# Patient Record
Sex: Male | Born: 1940 | Race: White | Hispanic: No | Marital: Married
Health system: Southern US, Academic
[De-identification: ages and names within clinical notes are randomized; demographics above are authoritative.]

## PROBLEM LIST (undated history)

## (undated) ENCOUNTER — Encounter

## (undated) ENCOUNTER — Telehealth

## (undated) ENCOUNTER — Encounter: Attending: Internal Medicine | Primary: Internal Medicine

## (undated) ENCOUNTER — Encounter: Attending: "Endocrinology | Primary: "Endocrinology

## (undated) ENCOUNTER — Ambulatory Visit: Payer: MEDICARE | Attending: "Endocrinology | Primary: "Endocrinology

## (undated) ENCOUNTER — Ambulatory Visit: Payer: MEDICARE

## (undated) ENCOUNTER — Ambulatory Visit

## (undated) ENCOUNTER — Ambulatory Visit: Payer: MEDICARE | Attending: Cardiovascular Disease | Primary: Cardiovascular Disease

## (undated) ENCOUNTER — Ambulatory Visit
Payer: MEDICARE | Attending: Student in an Organized Health Care Education/Training Program | Primary: Student in an Organized Health Care Education/Training Program

## (undated) ENCOUNTER — Telehealth: Attending: Internal Medicine | Primary: Internal Medicine

## (undated) ENCOUNTER — Ambulatory Visit: Payer: MEDICARE | Attending: Family | Primary: Family

## (undated) ENCOUNTER — Ambulatory Visit: Payer: MEDICARE | Attending: Physical Medicine & Rehabilitation | Primary: Physical Medicine & Rehabilitation

## (undated) ENCOUNTER — Ambulatory Visit: Payer: Medicare (Managed Care)

## (undated) ENCOUNTER — Encounter
Attending: Student in an Organized Health Care Education/Training Program | Primary: Student in an Organized Health Care Education/Training Program

## (undated) ENCOUNTER — Telehealth
Attending: Student in an Organized Health Care Education/Training Program | Primary: Student in an Organized Health Care Education/Training Program

## (undated) ENCOUNTER — Encounter: Attending: Adult Health | Primary: Adult Health

## (undated) ENCOUNTER — Ambulatory Visit: Payer: MEDICARE | Attending: Registered" | Primary: Registered"

## (undated) ENCOUNTER — Ambulatory Visit: Payer: MEDICARE | Attending: Internal Medicine | Primary: Internal Medicine

## (undated) ENCOUNTER — Telehealth: Attending: "Endocrinology | Primary: "Endocrinology

## (undated) ENCOUNTER — Ambulatory Visit: Payer: MEDICARE | Attending: Urology | Primary: Urology

## (undated) ENCOUNTER — Encounter: Attending: Cardiovascular Disease | Primary: Cardiovascular Disease

## (undated) ENCOUNTER — Encounter: Attending: Pharmacotherapy | Primary: Pharmacotherapy

## (undated) ENCOUNTER — Ambulatory Visit: Attending: Clinical | Primary: Clinical

## (undated) ENCOUNTER — Telehealth: Attending: Audiologist | Primary: Audiologist

## (undated) ENCOUNTER — Ambulatory Visit
Payer: MEDICARE | Attending: Rehabilitative and Restorative Service Providers" | Primary: Rehabilitative and Restorative Service Providers"

## (undated) ENCOUNTER — Telehealth: Attending: Family | Primary: Family

## (undated) ENCOUNTER — Telehealth: Attending: Urology | Primary: Urology

## (undated) ENCOUNTER — Encounter: Attending: Clinical | Primary: Clinical

## (undated) ENCOUNTER — Telehealth: Attending: Pharmacotherapy | Primary: Pharmacotherapy

## (undated) ENCOUNTER — Institutional Professional Consult (permissible substitution): Payer: MEDICARE

## (undated) ENCOUNTER — Encounter: Attending: Family | Primary: Family

## (undated) DIAGNOSIS — Z972 Presence of dental prosthetic device (complete) (partial): Secondary | ICD-10-CM

## (undated) DIAGNOSIS — IMO0001 Reserved for inherently not codable concepts without codable children: Secondary | ICD-10-CM

## (undated) DIAGNOSIS — R0600 Dyspnea, unspecified: Secondary | ICD-10-CM

## (undated) DIAGNOSIS — F329 Major depressive disorder, single episode, unspecified: Secondary | ICD-10-CM

## (undated) DIAGNOSIS — Z8709 Personal history of other diseases of the respiratory system: Secondary | ICD-10-CM

## (undated) DIAGNOSIS — N200 Calculus of kidney: Secondary | ICD-10-CM

## (undated) DIAGNOSIS — J302 Other seasonal allergic rhinitis: Secondary | ICD-10-CM

## (undated) DIAGNOSIS — M5030 Other cervical disc degeneration, unspecified cervical region: Secondary | ICD-10-CM

## (undated) DIAGNOSIS — E119 Type 2 diabetes mellitus without complications: Secondary | ICD-10-CM

## (undated) DIAGNOSIS — I2510 Atherosclerotic heart disease of native coronary artery without angina pectoris: Secondary | ICD-10-CM

## (undated) DIAGNOSIS — F32A Depression, unspecified: Secondary | ICD-10-CM

## (undated) DIAGNOSIS — M47815 Spondylosis without myelopathy or radiculopathy, thoracolumbar region: Secondary | ICD-10-CM

## (undated) DIAGNOSIS — E785 Hyperlipidemia, unspecified: Secondary | ICD-10-CM

## (undated) DIAGNOSIS — K219 Gastro-esophageal reflux disease without esophagitis: Secondary | ICD-10-CM

## (undated) DIAGNOSIS — J449 Chronic obstructive pulmonary disease, unspecified: Secondary | ICD-10-CM

## (undated) DIAGNOSIS — I219 Acute myocardial infarction, unspecified: Secondary | ICD-10-CM

## (undated) DIAGNOSIS — H9193 Unspecified hearing loss, bilateral: Secondary | ICD-10-CM

## (undated) DIAGNOSIS — H269 Unspecified cataract: Secondary | ICD-10-CM

## (undated) DIAGNOSIS — N281 Cyst of kidney, acquired: Secondary | ICD-10-CM

## (undated) DIAGNOSIS — M479 Spondylosis, unspecified: Secondary | ICD-10-CM

## (undated) DIAGNOSIS — C801 Malignant (primary) neoplasm, unspecified: Secondary | ICD-10-CM

## (undated) DIAGNOSIS — M1990 Unspecified osteoarthritis, unspecified site: Secondary | ICD-10-CM

## (undated) DIAGNOSIS — Z87442 Personal history of urinary calculi: Secondary | ICD-10-CM

## (undated) DIAGNOSIS — J45909 Unspecified asthma, uncomplicated: Secondary | ICD-10-CM

## (undated) DIAGNOSIS — L409 Psoriasis, unspecified: Secondary | ICD-10-CM

## (undated) HISTORY — DX: Type 2 diabetes mellitus without complications: E11.9

## (undated) HISTORY — PX: CARDIAC CATHETERIZATION: SHX172

## (undated) HISTORY — PX: COLONOSCOPY: SHX174

## (undated) HISTORY — DX: Chronic obstructive pulmonary disease, unspecified: J44.9

## (undated) HISTORY — DX: Acute myocardial infarction, unspecified: I21.9

## (undated) HISTORY — PX: OTHER SURGICAL HISTORY: SHX169

## (undated) HISTORY — DX: Psoriasis, unspecified: L40.9

## (undated) HISTORY — PX: LIPOMA EXCISION: SHX5283

## (undated) HISTORY — DX: Unspecified osteoarthritis, unspecified site: M19.90

## (undated) HISTORY — DX: Gastro-esophageal reflux disease without esophagitis: K21.9

---
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
## 1990-05-29 HISTORY — PX: SINUS EXPLORATION: SHX5214

## 1990-05-29 HISTORY — PX: HIATAL HERNIA REPAIR: SHX195

## 2000-08-14 DIAGNOSIS — I2510 Atherosclerotic heart disease of native coronary artery without angina pectoris: Secondary | ICD-10-CM | POA: Insufficient documentation

## 2000-08-14 DIAGNOSIS — J45909 Unspecified asthma, uncomplicated: Secondary | ICD-10-CM | POA: Insufficient documentation

## 2000-08-14 DIAGNOSIS — K219 Gastro-esophageal reflux disease without esophagitis: Secondary | ICD-10-CM | POA: Insufficient documentation

## 2006-02-27 ENCOUNTER — Ambulatory Visit: Payer: Self-pay | Admitting: Urology

## 2006-08-29 ENCOUNTER — Ambulatory Visit: Payer: Self-pay | Admitting: Urology

## 2007-05-01 DIAGNOSIS — J329 Chronic sinusitis, unspecified: Secondary | ICD-10-CM | POA: Insufficient documentation

## 2007-10-10 ENCOUNTER — Ambulatory Visit: Payer: Self-pay | Admitting: Urology

## 2007-10-15 ENCOUNTER — Ambulatory Visit: Payer: Self-pay | Admitting: Urology

## 2008-07-30 DIAGNOSIS — E669 Obesity, unspecified: Secondary | ICD-10-CM | POA: Insufficient documentation

## 2009-07-15 DIAGNOSIS — E119 Type 2 diabetes mellitus without complications: Secondary | ICD-10-CM | POA: Insufficient documentation

## 2009-08-06 DIAGNOSIS — L409 Psoriasis, unspecified: Secondary | ICD-10-CM | POA: Insufficient documentation

## 2010-10-20 DIAGNOSIS — M5412 Radiculopathy, cervical region: Secondary | ICD-10-CM | POA: Insufficient documentation

## 2012-05-14 DIAGNOSIS — J301 Allergic rhinitis due to pollen: Secondary | ICD-10-CM | POA: Insufficient documentation

## 2014-01-13 DIAGNOSIS — E785 Hyperlipidemia, unspecified: Secondary | ICD-10-CM | POA: Insufficient documentation

## 2014-05-29 DIAGNOSIS — I219 Acute myocardial infarction, unspecified: Secondary | ICD-10-CM

## 2014-05-29 HISTORY — DX: Acute myocardial infarction, unspecified: I21.9

## 2014-05-29 HISTORY — PX: CORONARY ANGIOPLASTY: SHX604

## 2014-05-29 HISTORY — PX: CORONARY STENT PLACEMENT: SHX1402

## 2014-08-28 DIAGNOSIS — M5030 Other cervical disc degeneration, unspecified cervical region: Secondary | ICD-10-CM | POA: Insufficient documentation

## 2014-12-31 DIAGNOSIS — I213 ST elevation (STEMI) myocardial infarction of unspecified site: Secondary | ICD-10-CM | POA: Insufficient documentation

## 2015-02-22 ENCOUNTER — Encounter: Payer: Medicare Other | Attending: Cardiology | Admitting: *Deleted

## 2015-02-22 VITALS — Ht 69.0 in | Wt 233.4 lb

## 2015-02-22 DIAGNOSIS — Z9861 Coronary angioplasty status: Secondary | ICD-10-CM | POA: Diagnosis present

## 2015-02-22 DIAGNOSIS — H9193 Unspecified hearing loss, bilateral: Secondary | ICD-10-CM | POA: Insufficient documentation

## 2015-02-22 DIAGNOSIS — I2111 ST elevation (STEMI) myocardial infarction involving right coronary artery: Secondary | ICD-10-CM | POA: Insufficient documentation

## 2015-02-22 DIAGNOSIS — J449 Chronic obstructive pulmonary disease, unspecified: Secondary | ICD-10-CM | POA: Insufficient documentation

## 2015-02-22 NOTE — Progress Notes (Signed)
Cardiac Individual Treatment Plan  Patient Details  Name: Dustin Blackburn MRN: [id number] Date of Birth: 1940-07-09 Referring Provider:  Creta Levin, MD  Initial Encounter Date: Date: 02/22/15  Visit Diagnosis: ST elevation myocardial infarction involving right coronary artery  S/P PTCA (percutaneous transluminal coronary angioplasty)  Patient's Home Medications on Admission:  Current outpatient prescriptions:  .  albuterol (PROVENTIL) (2.5 MG/3ML) 0.083% nebulizer solution, 2.5 mg., Disp: , Rfl:  .  aspirin EC 81 MG tablet, Take 81 mg by mouth., Disp: , Rfl:  .  azelastine (ASTELIN) 0.1 % nasal spray, 1 spray by Each Nare route Two (2) times a day., Disp: , Rfl:  .  budesonide-formoterol (SYMBICORT) 160-4.5 MCG/ACT inhaler, Inhale into the lungs., Disp: , Rfl:  .  fluocinonide cream (LIDEX) 0.05 %, Apply topically., Disp: , Rfl:  .  fluticasone (FLONASE) 50 MCG/ACT nasal spray, 2 sprays by Each Nare route daily., Disp: , Rfl:  .  montelukast (SINGULAIR) 10 MG tablet, Take by mouth., Disp: , Rfl:  .  glipiZIDE (GLUCOTROL) 5 MG tablet, Take 5 mg by mouth. IS taking 2.5 mg 2 times a day, Disp: , Rfl:   Past Medical History: No past medical history on file.  Tobacco Use: History  Smoking status  . Not on file  Smokeless tobacco  . Not on file    Labs: Recent Review Flowsheet Data    There is no flowsheet data to display.       Exercise Target Goals: Date: 02/22/15  Exercise Program Goal: Individual exercise prescription set with THRR, safety & activity barriers. Participant demonstrates ability to understand and report RPE using BORG scale, to self-measure pulse accurately, and to acknowledge the importance of the exercise prescription.  Exercise Prescription Goal: Starting with aerobic activity 30 plus minutes a day, 3 days per week for initial exercise prescription. Provide home exercise prescription and guidelines that participant acknowledges understanding  prior to discharge.  Activity Barriers & Risk Stratification:     Activity Barriers & Risk Stratification - 02/22/15 1358    Activity Barriers & Risk Stratification   Activity Barriers None   Risk Stratification High      6 Minute Walk:     6 Minute Walk      02/22/15 1356       6 Minute Walk   Phase Initial     Distance 1612 feet     Walk Time 6 minutes     Resting HR 76 bpm     Resting BP 124/70 mmHg     Max Ex. HR 108 bpm     Max Ex. BP 146/80 mmHg     RPE 11     Symptoms No        Initial Exercise Prescription:     Initial Exercise Prescription - 02/22/15 1300    Date of Initial Exercise Prescription   Date 02/22/15   Treadmill   MPH 3   Grade 0   Minutes 15   Bike   Level 1.5   Minutes 15   Recumbant Bike   Level 3   Watts 30   Minutes 15   NuStep   Level 3   Watts 40   Minutes 15   Arm Ergometer   Level 2   Watts 15   Minutes 15   Arm/Foot Ergometer   Level 2   Watts 20   Minutes 15   Cybex   Level 2   RPM 20   Minutes 15  Recumbant Elliptical   Level 1   Watts 20   Minutes 15   Elliptical   Level 1   Speed 3   Minutes 5   REL-XR   Level 3   Watts 30   Minutes 15   Prescription Details   Frequency (times per week) 3   Duration Progress to 30 minutes of continuous aerobic without signs/symptoms of physical distress   Intensity   THRR REST +  30   Ratings of Perceived Exertion 11-15   Progression Continue progressive overload as per policy without signs/symptoms or physical distress.   Resistance Training   Training Prescription Yes   Weight 3   Reps 10-12      Exercise Prescription Changes:   Discharge Exercise Prescription (Final Exercise Prescription Changes):   Nutrition:  Target Goals: Understanding of nutrition guidelines, daily intake of sodium 1500mg , cholesterol 200mg , calories 30% from fat and 7% or less from saturated fats, daily to have 5 or more servings of fruits and vegetables.  Biometrics:      Pre Biometrics - 02/22/15 1401    Pre Biometrics   Height 5\' 9"  (1.753 m)   Weight 233 lb 6.4 oz (105.87 kg)   Waist Circumference 47.5 inches   Hip Circumference 45 inches   Waist to Hip Ratio 1.06 %   BMI (Calculated) 34.5       Nutrition Therapy Plan and Nutrition Goals:   Nutrition Discharge: Rate Your Plate Scores:   Nutrition Goals Re-Evaluation:   Psychosocial: Target Goals: Acknowledge presence or absence of depression, maximize coping skills, provide positive support system. Participant is able to verbalize types and ability to use techniques and skills needed for reducing stress and depression.  Initial Review & Psychosocial Screening:     Initial Psych Review & Screening - 02/22/15 1501    Family Dynamics   Good Support System? Yes   Barriers   Psychosocial barriers to participate in program There are no identifiable barriers or psychosocial needs.;The patient should benefit from training in stress management and relaxation.   Screening Interventions   Interventions Encouraged to exercise;Program counselor consult      Quality of Life Scores:     Quality of Life - 02/22/15 1501    Quality of Life Scores   Health/Function Pre 25.13 %   Socioeconomic Pre 28 %   Psych/Spiritual Pre 28.29 %   Family Pre 30 %   GLOBAL Pre 27.11 %      PHQ-9:     Recent Review Flowsheet Data    Depression screen [facility] 2/9 02/22/2015   Decreased Interest 0   Down, Depressed, Hopeless 0   PHQ - 2 Score 0   Altered sleeping 0   Tired, decreased energy 1   Change in appetite 0   Feeling bad or failure about yourself  0   Trouble concentrating 0   Moving slowly or fidgety/restless 0   Suicidal thoughts 0   PHQ-9 Score 1   Difficult doing work/chores Somewhat difficult      Psychosocial Evaluation and Intervention:   Psychosocial Re-Evaluation:   Vocational Rehabilitation: Provide vocational rehab assistance to qualifying candidates.   Vocational Rehab  Evaluation & Intervention:     Vocational Rehab - 02/22/15 1359    Initial Vocational Rehab Evaluation & Intervention   Assessment shows need for Vocational Rehabilitation No      Education: Education Goals: Education classes will be provided on a weekly basis, covering required topics. Participant will state understanding/return demonstration  of topics presented.  Learning Barriers/Preferences:     Learning Barriers/Preferences - 02/22/15 1359    Learning Barriers/Preferences   Learning Barriers Hearing   Learning Preferences None      Education Topics: General Nutrition Guidelines/Fats and Fiber: -Group instruction provided by verbal, written material, models and posters to present the general guidelines for heart healthy nutrition. Gives an explanation and review of dietary fats and fiber.   Controlling Sodium/Reading Food Labels: -Group verbal and written material supporting the discussion of sodium use in heart healthy nutrition. Review and explanation with models, verbal and written materials for utilization of the food label.   Exercise Physiology & Risk Factors: - Group verbal and written instruction with models to review the exercise physiology of the cardiovascular system and associated critical values. Details cardiovascular disease risk factors and the goals associated with each risk factor.   Aerobic Exercise & Resistance Training: - Gives group verbal and written discussion on the health impact of inactivity. On the components of aerobic and resistive training programs and the benefits of this training and how to safely progress through these programs.   Flexibility, Balance, General Exercise Guidelines: - Provides group verbal and written instruction on the benefits of flexibility and balance training programs. Provides general exercise guidelines with specific guidelines to those with heart or lung disease. Demonstration and skill practice provided.   Stress  Management: - Provides group verbal and written instruction about the health risks of elevated stress, cause of high stress, and healthy ways to reduce stress.   Depression: - Provides group verbal and written instruction on the correlation between heart/lung disease and depressed mood, treatment options, and the stigmas associated with seeking treatment.   Anatomy & Physiology of the Heart: - Group verbal and written instruction and models provide basic cardiac anatomy and physiology, with the coronary electrical and arterial systems. Review of: AMI, Angina, Valve disease, Heart Failure, Cardiac Arrhythmia, Pacemakers, and the ICD.   Cardiac Procedures: - Group verbal and written instruction and models to describe the testing methods done to diagnose heart disease. Reviews the outcomes of the test results. Describes the treatment choices: Medical Management, Angioplasty, or Coronary Bypass Surgery.   Cardiac Medications: - Group verbal and written instruction to review commonly prescribed medications for heart disease. Reviews the medication, class of the drug, and side effects. Includes the steps to properly store meds and maintain the prescription regimen.   Go Sex-Intimacy & Heart Disease, Get SMART - Goal Setting: - Group verbal and written instruction through game format to discuss heart disease and the return to sexual intimacy. Provides group verbal and written material to discuss and apply goal setting through the application of the S.M.A.R.T. Method.   Other Matters of the Heart: - Provides group verbal, written materials and models to describe Heart Failure, Angina, Valve Disease, and Diabetes in the realm of heart disease. Includes description of the disease process and treatment options available to the cardiac patient.   Exercise & Equipment Safety: - Individual verbal instruction and demonstration of equipment use and safety with use of the equipment.          Cardiac  Rehab from 02/22/2015 in [facility] Cardiac Rehab   Date  02/22/15   Educator  SB   Instruction Review Code  2- meets goals/outcomes      Infection Prevention: - Provides verbal and written material to individual with discussion of infection control including proper hand washing and proper equipment cleaning during exercise session.  Cardiac Rehab from 02/22/2015 in [facility]iac Rehab   Date  02/22/15   Educator  SB   Instruction Review Code  2- meets goals/outcomes      Falls Prevention: - Provides verbal and written material to individual with discussion of falls prevention and safety.      Cardiac Rehab from 02/22/2015 in [facility] Cardiac Rehab   Date  02/22/15   Educator  SB   Instruction Review Code  2- meets goals/outcomes      Diabetes: - Individual verbal and written instruction to review signs/symptoms of diabetes, desired ranges of glucose level fasting, after meals and with exercise. Advice that pre and post exercise glucose checks will be done for 3 sessions at entry of program.      Cardiac Rehab from 02/22/2015 in [facility] Cardiac Rehab   Date  02/22/15   Educator  SB   Instruction Review Code  2- meets goals/outcomes       Knowledge Questionnaire Score:     Knowledge Questionnaire Score - 02/22/15 1359    Knowledge Questionnaire Score   Pre Score 19/28      Personal Goals and Risk Factors at Admission:     Personal Goals and Risk Factors at Admission - 02/22/15 1402    Personal Goals and Risk Factors on Admission    Weight Management Yes;Obesity   Intervention Learn and follow the exercise and diet guidelines while in the program. Utilize the nutrition and education classes to help gain knowledge of the diet and exercise expectations in the program   Intervention Provide weight management tools through evaluation completed by registered dietician and exercise physiologist.  Establish a goal weight with participant.   Goal Weight 190 lb (86.183 kg)  Billy wants to  get weight down to less than 200 pounds. He would like to get to 190 pounds. Abe People will meet with RD  for  nutrtion guidelines and work with the Ex Phys for his exercise progression.    Diabetes Yes  Abe People has been on several medication s for his diabets. He is currently on Glutrol with AM fasting BLood sugar readings120-130. He cannot tolerate several of the medications.   Goal Blood glucose control identified by blood glucose values, HgbA1C. Participant verbalizes understanding of the signs/symptoms of hyper/hypo glycemia, proper foot care and importance of medication and nutrition plan for blood glucose control.   Intervention Provide nutrition & aerobic exercise along with prescribed medications to achieve blood glucose in normal ranges: Fasting 65-99 mg/dL   Lipids Yes  Abe People has stopped his astorvastatin. He has a holistic physician that has [place] on natural medication supplements and IV Chelation for Cholesterol control.  He expects to have labwork soon.    Goal Cholesterol controlled with medications as prescribed, with individualized exercise RX and with personalized nutrition plan. Value goals: LDL < 70mg , HDL > 40mg . Participant states understanding of desired cholesterol values and following prescriptions.   Intervention Provide nutrition & aerobic exercise along with prescribed medications to achieve LDL 70mg , HDL >40mg .      Personal Goals and Risk Factors Review:    Personal Goals Discharge (Final Personal Goals and Risk Factors Review):     Comments: 30 day review. Billyhas completed orientation and will start classes soon.

## 2015-02-22 NOTE — Patient Instructions (Signed)
Patient Instructions  Patient Details  Name: Dustin Blackburn MRN: [id number] Date of Birth: [birth year] Referring Provider:  Creta Levin, MD  Below are the personal goals you chose as well as exercise and nutrition goals. Our goal is to help you keep on track towards obtaining and maintaining your goals. We will be discussing your progress on these goals with you throughout the program.  Initial Exercise Prescription:     Initial Exercise Prescription - 02/22/15 1300    Date of Initial Exercise Prescription   Date 02/22/15   Treadmill   MPH 3   Grade 0   Minutes 15   Bike   Level 1.5   Minutes 15   Recumbant Bike   Level 3   Watts 30   Minutes 15   NuStep   Level 3   Watts 40   Minutes 15   Arm Ergometer   Level 2   Watts 15   Minutes 15   Arm/Foot Ergometer   Level 2   Watts 20   Minutes 15   Cybex   Level 2   RPM 20   Minutes 15   Recumbant Elliptical   Level 1   Watts 20   Minutes 15   Elliptical   Level 1   Speed 3   Minutes 5   REL-XR   Level 3   Watts 30   Minutes 15   Prescription Details   Frequency (times per week) 3   Duration Progress to 30 minutes of continuous aerobic without signs/symptoms of physical distress   Intensity   THRR REST +  30   Ratings of Perceived Exertion 11-15   Progression Continue progressive overload as per policy without signs/symptoms or physical distress.   Resistance Training   Training Prescription Yes   Weight 3   Reps 10-12      Exercise Goals: Frequency: Be able to perform aerobic exercise three times per week working toward 3-5 days per week.  Intensity: Work with a perceived exertion of 11 (fairly light) - 15 (hard) as tolerated. Follow your new exercise prescription and watch for changes in prescription as you progress with the program. Changes will be reviewed with you when they are made.  Duration: You should be able to do 30 minutes of continuous aerobic exercise in addition to a 5 minute  warm-up and a 5 minute cool-down routine.  Nutrition Goals: Your personal nutrition goals will be established when you do your nutrition analysis with the dietician.  The following are nutrition guidelines to follow: Cholesterol < 200mg /day Sodium < 1500mg /day Fiber: Men over 50 yrs - 30 grams per day  Personal Goals:     Personal Goals and Risk Factors at Admission - 02/22/15 1402    Personal Goals and Risk Factors on Admission    Weight Management Yes;Obesity   Intervention Learn and follow the exercise and diet guidelines while in the program. Utilize the nutrition and education classes to help gain knowledge of the diet and exercise expectations in the program   Intervention Provide weight management tools through evaluation completed by registered dietician and exercise physiologist.  Establish a goal weight with participant.   Goal Weight 190 lb (86.183 kg)  Dustin Blackburn wants to get weight down to less than 200 pounds. He would like to get to 190 pounds. Dustin Blackburn will meet with RD  for  nutrtion guidelines and work with the Ex Phys for his exercise progression.    Diabetes Yes  Dustin Blackburn  has been on several medication s for his diabets. He is currently on Glutrol with AM fasting BLood sugar readings120-130. He cannot tolerate several of the medications.   Goal Blood glucose control identified by blood glucose values, HgbA1C. Participant verbalizes understanding of the signs/symptoms of hyper/hypo glycemia, proper foot care and importance of medication and nutrition plan for blood glucose control.   Intervention Provide nutrition & aerobic exercise along with prescribed medications to achieve blood glucose in normal ranges: Fasting 65-99 mg/dL   Lipids Yes  Dustin Blackburn has stopped his astorvastatin. He has a holistic physician that has [place] on natural medication supplements and IV Chelation for Cholesterol control.  He expects to have labwork soon.    Goal Cholesterol controlled with medications as  prescribed, with individualized exercise RX and with personalized nutrition plan. Value goals: LDL < 70mg , HDL > 40mg . Participant states understanding of desired cholesterol values and following prescriptions.   Intervention Provide nutrition & aerobic exercise along with prescribed medications to achieve LDL 70mg , HDL >40mg .      Tobacco Use Initial Evaluation: History  Smoking status  . Not on file  Smokeless tobacco  . Not on file    Copy of goals given to participant.

## 2015-03-01 ENCOUNTER — Encounter: Payer: Medicare Other | Attending: Cardiology | Admitting: *Deleted

## 2015-03-01 DIAGNOSIS — I2111 ST elevation (STEMI) myocardial infarction involving right coronary artery: Secondary | ICD-10-CM | POA: Diagnosis not present

## 2015-03-01 DIAGNOSIS — Z9861 Coronary angioplasty status: Secondary | ICD-10-CM | POA: Diagnosis present

## 2015-03-01 LAB — GLUCOSE, CAPILLARY: Glucose-Capillary: 141 mg/dL — ABNORMAL HIGH (ref 65–99)

## 2015-03-01 NOTE — Progress Notes (Signed)
Daily Session Note  Patient Details  Name: Dustin Blackburn MRN: [id number] Date of Birth: [birth year] Referring Provider:  Creta Levin, MD  Encounter Date: 03/01/2015  Check In:     Session Check In - 03/01/15 0822    Check-In   Staff Present Candiss Norse MS, ACSM CEP Exercise Physiologist;Lakeia Bradshaw Alfonso Patten, ACSM CEP Exercise Physiologist;Carroll Enterkin RN, BSN   Medication changes reported     Yes   Comments atorvastatin 40 mg tab 1 time/day; lisinopril 2.5 mg 1 time/day; metoprolol tartrate 25 mg tablet (1/2 tab/12.5 mg 2x/day)    Fall or balance concerns reported    No   Warm-up and Cool-down Performed on first and last piece of equipment   VAD Patient? No   Pain Assessment   Currently in Pain? No/denies   Multiple Pain Sites No         Goals Met:  Independence with exercise equipment Exercise tolerated well No report of cardiac concerns or symptoms Strength training completed today  Goals Unmet:  Not Applicable  Goals Comments: Today was the patient's first day of class. The patient's initial exercise prescription (based on the 6 min walk evaluation) was reviewed with the patient.    Dr. Emily Filbert is Medical Director for [place] and [facility].

## 2015-03-03 DIAGNOSIS — I2111 ST elevation (STEMI) myocardial infarction involving right coronary artery: Secondary | ICD-10-CM

## 2015-03-03 NOTE — Progress Notes (Signed)
Daily Session Note  Patient Details  Name: Dustin Blackburn MRN: [id number] Date of Birth: 1940-09-27 Referring Provider:  Creta Levin, MD  Encounter Date: 03/03/2015  Check In:     Session Check In - 03/03/15 0826    Check-In   Staff Present Nyoka Cowden RN;Renee [place] MS, ACSM CEP Exercise Physiologist;Cloyce Blankenhorn BS, ACSM EP-C, Exercise Physiologist   ER physicians immediately available to respond to emergencies See telemetry face sheet for immediately available ER MD   Medication changes reported     No   Fall or balance concerns reported    No   Warm-up and Cool-down Performed on first and last piece of equipment   VAD Patient? No   Pain Assessment   Currently in Pain? No/denies         Goals Met:  Proper associated with RPD/PD & O2 Sat Exercise tolerated well No report of cardiac concerns or symptoms Strength training completed today  Goals Unmet:  Not Applicable  Goals Comments:    Dr. Emily Filbert is Medical Director for [place] and [facility].

## 2015-03-10 ENCOUNTER — Encounter: Payer: Self-pay | Admitting: *Deleted

## 2015-03-10 DIAGNOSIS — I2111 ST elevation (STEMI) myocardial infarction involving right coronary artery: Secondary | ICD-10-CM

## 2015-03-10 NOTE — Progress Notes (Signed)
Cardiac Individual Treatment Plan  Patient Details  Name: Dustin Blackburn MRN: [id number] Date of Birth: [birth year] Referring Provider:  No ref. provider found  Initial Encounter Date:    Visit Diagnosis: ST elevation myocardial infarction involving right coronary artery ([place])  Patient's Home Medications on Admission:  Current outpatient prescriptions:  .  albuterol (PROVENTIL) (2.5 MG/3ML) 0.083% nebulizer solution, 2.5 mg., Disp: , Rfl:  .  aspirin EC 81 MG tablet, Take 81 mg by mouth., Disp: , Rfl:  .  atorvastatin (LIPITOR) 40 MG tablet, Take 40 mg by mouth., Disp: , Rfl:  .  azelastine (ASTELIN) 0.1 % nasal spray, 1 spray by Each Nare route Two (2) times a day., Disp: , Rfl:  .  budesonide-formoterol (SYMBICORT) 160-4.5 MCG/ACT inhaler, Inhale into the lungs., Disp: , Rfl:  .  fluocinonide cream (LIDEX) 0.05 %, Apply topically., Disp: , Rfl:  .  fluticasone (FLONASE) 50 MCG/ACT nasal spray, 2 sprays by Each Nare route daily., Disp: , Rfl:  .  glipiZIDE (GLUCOTROL) 5 MG tablet, Take 5 mg by mouth. IS taking 2.5 mg 2 times a day, Disp: , Rfl:  .  lisinopril (PRINIVIL,ZESTRIL) 2.5 MG tablet, Take by mouth., Disp: , Rfl:  .  metoprolol tartrate (LOPRESSOR) 25 MG tablet, Take by mouth., Disp: , Rfl:  .  montelukast (SINGULAIR) 10 MG tablet, Take by mouth., Disp: , Rfl:   Past Medical History: No past medical history on file.  Tobacco Use: History  Smoking status  . Not on file  Smokeless tobacco  . Not on file    Labs: Recent Review Flowsheet Data    There is no flowsheet data to display.       Exercise Target Goals:    Exercise Program Goal: Individual exercise prescription set with THRR, safety & activity barriers. Participant demonstrates ability to understand and report RPE using BORG scale, to self-measure pulse accurately, and to acknowledge the importance of the exercise prescription.  Exercise Prescription Goal: Starting with aerobic activity 30 plus minutes  a day, 3 days per week for initial exercise prescription. Provide home exercise prescription and guidelines that participant acknowledges understanding prior to discharge.  Activity Barriers & Risk Stratification:     Activity Barriers & Risk Stratification - 02/22/15 1358    Activity Barriers & Risk Stratification   Activity Barriers None   Risk Stratification High      6 Minute Walk:     6 Minute Walk      02/22/15 1356       6 Minute Walk   Phase Initial     Distance 1612 feet     Walk Time 6 minutes     Resting HR 76 bpm     Resting BP 124/70 mmHg     Max Ex. HR 108 bpm     Max Ex. BP 146/80 mmHg     RPE 11     Symptoms No        Initial Exercise Prescription:     Initial Exercise Prescription - 02/22/15 1300    Date of Initial Exercise Prescription   Date 02/22/15   Treadmill   MPH 3   Grade 0   Minutes 15   Bike   Level 1.5   Minutes 15   Recumbant Bike   Level 3   Watts 30   Minutes 15   NuStep   Level 3   Watts 40   Minutes 15   Arm Ergometer   Level 2  Watts 15   Minutes 15   Arm/Foot Ergometer   Level 2   Watts 20   Minutes 15   Cybex   Level 2   RPM 20   Minutes 15   Recumbant Elliptical   Level 1   Watts 20   Minutes 15   Elliptical   Level 1   Speed 3   Minutes 5   REL-XR   Level 3   Watts 30   Minutes 15   Prescription Details   Frequency (times per week) 3   Duration Progress to 30 minutes of continuous aerobic without signs/symptoms of physical distress   Intensity   THRR REST +  30   Ratings of Perceived Exertion 11-15   Progression Continue progressive overload as per policy without signs/symptoms or physical distress.   Resistance Training   Training Prescription Yes   Weight 3   Reps 10-12      Exercise Prescription Changes:     Exercise Prescription Changes      03/02/15 0700           Exercise Review   Progression No  First day of exercise       Response to Exercise   Blood Pressure (Admit)  124/82 mmHg       Blood Pressure (Exercise) 118/64 mmHg       Blood Pressure (Exit) 104/64 mmHg       Heart Rate (Admit) 79 bpm       Heart Rate (Exercise) 101 bpm       Heart Rate (Exit) 80 bpm       Rating of Perceived Exertion (Exercise) 11       Symptoms None       Comments First day of exercise! Patient was oriented to the gym and the equipment functions and settings. Procedures and policies of the gym were outlined and explained. The patient's individual exercise prescription and treatment plan were reviewed with them. All starting workloads were established based on the results of the functional testing  done at the initial intake visit. The plan for exercise progression was also introduced and progression will be customized based on the patient's performance and goals.        Duration Progress to 30 minutes of continuous aerobic without signs/symptoms of physical distress       Intensity Rest + 30       Progression Continue progressive overload as per policy without signs/symptoms or physical distress.       Resistance Training   Training Prescription Yes       Weight 2       Reps 10-15       Interval Training   Interval Training No       Treadmill   MPH 2.5       Grade 0       Minutes 15       NuStep   Level 3       Watts 60       Minutes 20          Discharge Exercise Prescription (Final Exercise Prescription Changes):     Exercise Prescription Changes - 03/02/15 0700    Exercise Review   Progression No  First day of exercise   Response to Exercise   Blood Pressure (Admit) 124/82 mmHg   Blood Pressure (Exercise) 118/64 mmHg   Blood Pressure (Exit) 104/64 mmHg   Heart Rate (Admit) 79 bpm   Heart Rate (Exercise) 101  bpm   Heart Rate (Exit) 80 bpm   Rating of Perceived Exertion (Exercise) 11   Symptoms None   Comments First day of exercise! Patient was oriented to the gym and the equipment functions and settings. Procedures and policies of the gym were outlined and  explained. The patient's individual exercise prescription and treatment plan were reviewed with them. All starting workloads were established based on the results of the functional testing  done at the initial intake visit. The plan for exercise progression was also introduced and progression will be customized based on the patient's performance and goals.    Duration Progress to 30 minutes of continuous aerobic without signs/symptoms of physical distress   Intensity Rest + 30   Progression Continue progressive overload as per policy without signs/symptoms or physical distress.   Resistance Training   Training Prescription Yes   Weight 2   Reps 10-15   Interval Training   Interval Training No   Treadmill   MPH 2.5   Grade 0   Minutes 15   NuStep   Level 3   Watts 60   Minutes 20      Nutrition:  Target Goals: Understanding of nutrition guidelines, daily intake of sodium <1563m, cholesterol <2047m calories 30% from fat and 7% or less from saturated fats, daily to have 5 or more servings of fruits and vegetables.  Biometrics:     Pre Biometrics - 02/22/15 1401    Pre Biometrics   Height _0  (1.753 m)   Weight 233 lb 6.4 oz (105.87 kg)   Waist Circumference 47.5 inches   Hip Circumference 45 inches   Waist to Hip Ratio 1.06 %   BMI (Calculated) 34.5       Nutrition Therapy Plan and Nutrition Goals:   Nutrition Discharge: Rate Your Plate Scores:   Nutrition Goals Re-Evaluation:   Psychosocial: Target Goals: Acknowledge presence or absence of depression, maximize coping skills, provide positive support system. Participant is able to verbalize types and ability to use techniques and skills needed for reducing stress and depression.  Initial Review & Psychosocial Screening:     Initial Psych Review & Screening - 02/22/15 1501    Family Dynamics   Good Support System? Yes   Barriers   Psychosocial barriers to participate in program There are no identifiable  barriers or psychosocial needs.;The patient should benefit from training in stress management and relaxation.   Screening Interventions   Interventions Encouraged to exercise;Program counselor consult      Quality of Life Scores:     Quality of Life - 02/22/15 1501    Quality of Life Scores   Health/Function Pre 25.13 %   Socioeconomic Pre 28 %   Psych/Spiritual Pre 28.29 %   Family Pre 30 %   GLOBAL Pre 27.11 %      PHQ-9:     Recent Review Flowsheet Data    [facility] 03/01[facility]2015 02/22/2015   Decreased Interest 0 0   Down, Depressed, Hopeless 0 0   PHQ - 2 Score 0 0   Altered sleeping 1 0   Tired, decreased energy 1 1   Change in appetite 0 0   Feeling bad or failure about yourself  0 0   Trouble concentrating 0 0   Moving slowly or fidgety/restless 0 0   Suicidal thoughts 0 0   PHQ-9 Score 2 1   Difficult doing work/chores Not difficult at all Somewhat difficult      Psychosocial  Evaluation and Intervention:     Psychosocial Evaluation - 03/01/15 1027    Psychosocial Evaluation & Interventions   Interventions Stress management education;Relaxation education;Encouraged to exercise with the program and follow exercise prescription   Comments Counselor met today with Dustin Blackburn.  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  He has a strong support system with a spouse of 67 years and (37) adult daughters, some of which live close by.  He states he has a hard time staying asleep and has tried several over the counter medications to treat this without success.  He reports his appetite is "too good" and he denies past or current symptoms of depression or anxiety.  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  His goals are to lose weight, decrease his sugar levels, and improve his quality of life in this program.  Since Dustin Blackburn prefers  to treat medical issues "naturally" counselor recommended considering an OTC sleep aid that is sustained release (Melatonin) and checking with his Dr. or pharmacist about this. Dustin Blackburn is encouraged to exercise consistently and to complete this program.      Continued Psychosocial Services Needed Yes  Dustin Blackburn will benefit from a possible OTC sleep aid, from meeting with the dietician to address his weight loss goals, and consistent exercise.       Psychosocial Re-Evaluation:   Vocational Rehabilitation: Provide vocational rehab assistance to qualifying candidates.   Vocational Rehab Evaluation & Intervention:     Vocational Rehab - 02/22/15 1359    Initial Vocational Rehab Evaluation & Intervention   Assessment shows need for Vocational Rehabilitation No      Education: Education Goals: Education classes will be provided on a weekly basis, covering required topics. Participant will state understanding/return demonstration of topics presented.  Learning Barriers/Preferences:     Learning Barriers/Preferences - 02/22/15 1359    Learning Barriers/Preferences   Learning Barriers Hearing   Learning Preferences None      Education Topics: General Nutrition Guidelines/Fats and Fiber: -Group instruction provided by verbal, written material, models and posters to present the general guidelines for heart healthy nutrition. Gives an explanation and review of dietary fats and fiber.   Controlling Sodium/Reading Food Labels: -Group verbal and written material supporting the discussion of sodium use in heart healthy nutrition. Review and explanation with models, verbal and written materials for utilization of the food label.   Exercise Physiology & Risk Factors: - Group verbal and written instruction with models to review the exercise physiology of the cardiovascular system and associated critical values. Details cardiovascular disease risk factors and the goals associated with each  risk factor.   Aerobic Exercise & Resistance Training: - Gives group verbal and written discussion on the health impact of inactivity. On the components of aerobic and resistive training programs and the benefits of this training and how to safely progress through these programs.   Flexibility, Balance, General Exercise Guidelines: - Provides group verbal and written instruction on the benefits of flexibility and balance training programs. Provides general exercise guidelines with specific guidelines to those with heart or lung disease. Demonstration and skill practice provided.   Stress Management: - Provides group verbal and written instruction about the health risks of elevated stress, cause of high stress, and healthy ways to reduce stress.   Depression: - Provides group  verbal and written instruction on the correlation between heart/lung disease and depressed mood, treatment options, and the stigmas associated with seeking treatment.   Anatomy & Physiology of the Heart: - Group verbal and written instruction and models provide basic cardiac anatomy and physiology, with the coronary electrical and arterial systems. Review of: AMI, Angina, Valve disease, Heart Failure, Cardiac Arrhythmia, Pacemakers, and the ICD.   Cardiac Procedures: - Group verbal and written instruction and models to describe the testing methods done to diagnose heart disease. Reviews the outcomes of the test results. Describes the treatment choices: Medical Management, Angioplasty, or Coronary Bypass Surgery.   Cardiac Medications: - Group verbal and written instruction to review commonly prescribed medications for heart disease. Reviews the medication, class of the drug, and side effects. Includes the steps to properly store meds and maintain the prescription regimen.   Go Sex-Intimacy & Heart Disease, Get SMART - Goal Setting: - Group verbal and written instruction through game format to discuss heart disease  and the return to sexual intimacy. Provides group verbal and written material to discuss and apply goal setting through the application of the S.M.A.R.T. Method.   Other Matters of the Heart: - Provides group verbal, written materials and models to describe Heart Failure, Angina, Valve Disease, and Diabetes in the realm of heart disease. Includes description of the disease process and treatment options available to the cardiac patient.          Cardiac Rehab from 03/03/2015 in [facility] Cardiac Rehab   Date  03/03/15   Educator  Blackburn   Instruction Review Code  2- meets goals/outcomes      Exercise & Equipment Safety: - Individual verbal instruction and demonstration of equipment use and safety with use of the equipment.      Cardiac Rehab from 03/03/2015 in [facility] Cardiac Rehab   Date  02/22/15   Educator  SB   Instruction Review Code  2- meets goals/outcomes      Infection Prevention: - Provides verbal and written material to individual with discussion of infection control including proper hand washing and proper equipment cleaning during exercise session.      Cardiac Rehab from 03/03/2015 in [facility] Cardiac Re[facility]   Date  02/22/15   Educator  SB   Instruction Review Code  2- meets goals/outcomes      Falls Prevention: - Provides verbal and written material to individual with discussion of falls prevention and safety.      Cardiac Rehab from 03/03/2015 in [facility] Cardiac Rehab   Date  02/22/15   Educator  SB   Instruction Review Code  2- meets goals/outcomes      Diabetes: - Individual verbal and written instruction to review signs/symptoms of diabetes, desired ranges of glucose level fasting, after meals and with exercise. Advice that pre and post exercise glucose checks will be done for 3 sessions at entry of program.      Cardiac Rehab from 03/03/2015 in [facility] Cardiac Rehab   Date  02/22/15   Educator  SB   Instruction Review Code  2- meets goals/outcomes       Knowledge  Questionnaire Score:     Knowledge Questionnaire Score - 02/22/15 1359    Knowledge Questionnaire Score   Pre Score 19/28      Personal Goals and Risk Factors at Admission:     Personal Goals and Risk Factors at Admission - 02/22/15 1402    Personal Goals and Risk Factors on Admission    Weight Management  Yes;Obesity   Intervention Learn and follow the exercise and diet guidelines while in the program. Utilize the nutrition and education classes to help gain knowledge of the diet and exercise expectations in the program   Intervention Provide weight management tools through evaluation completed by registered dietician and exercise physiologist.  Establish a goal weight with participant.   Goal Weight 190 lb (86.183 kg)  Dustin Blackburn wants to get weight down to less than 200 pounds. He would like to get to 190 pounds. Dustin Blackburn will meet with RD  for  nutrtion guidelines and work with the Ex Phys for his exercise progression.    Diabetes Yes  Dustin Blackburn has been on several medication s for his diabets. He is currently on Glutrol with AM fasting BLood sugar readings120-130. He cannot tolerate several of the medications.   Goal Blood glucose control identified by blood glucose values, HgbA1C. Participant verbalizes understanding of the signs/symptoms of hyper/hypo glycemia, proper foot care and importance of medication and nutrition plan for blood glucose control.   Intervention Provide nutrition & aerobic exercise along with prescribed medications to achieve blood glucose in normal ranges: Fasting 65-99 mg/dL   Lipids Yes  Dustin Blackburn has stopped his astorvastatin. He has a holistic physician that has [place] on natural medication supplements and IV Chelation for Cholesterol control.  He expects to have labwork soon.    Goal Cholesterol controlled with medications as prescribed, with individualized exercise RX and with personalized nutrition plan. Value goals: LDL < 74m, HDL > 478m Participant states understanding  of desired cholesterol values and following prescriptions.   Intervention Provide nutrition & aerobic exercise along with prescribed medications to achieve LDL <7011mHDL >33m22m    Personal Goals and Risk Factors Review:      Goals and Risk Factor Review      03/03/15 0942           Weight Management   Goals Progress/Improvement seen No       Comments Has only been in program short time, no changed noted.  Has appt. with dietician.          Personal Goals Discharge (Final Personal Goals and Risk Factors Review):      Goals and Risk Factor Review - 03/03/15 0942    Weight Management   Goals Progress/Improvement seen No   Comments Has only been in program short time, no changed noted.  Has appt. with dietician.       Comments: Last visit 03/03/2015

## 2015-03-25 ENCOUNTER — Telehealth: Payer: Self-pay | Admitting: *Deleted

## 2015-03-25 ENCOUNTER — Encounter: Payer: Self-pay | Admitting: *Deleted

## 2015-03-25 DIAGNOSIS — I2111 ST elevation (STEMI) myocardial infarction involving right coronary artery: Secondary | ICD-10-CM

## 2015-03-25 NOTE — Progress Notes (Signed)
Cardiac Individual Treatment Plan  Patient Details  Name: Dustin Blackburn MRN: [id number] Date of Birth: [birth year] Referring Provider:  No ref. provider found  Initial Encounter Date:  02/22/2015  Visit Diagnosis: ST elevation myocardial infarction involving right coronary artery (HCC)  Patient's Home Medications on Admission:  Current outpatient prescriptions:  .  albuterol (PROVENTIL) (2.5 MG/3ML) 0.083% nebulizer solution, 2.5 mg., Disp: , Rfl:  .  aspirin EC 81 MG tablet, Take 81 mg by mouth., Disp: , Rfl:  .  atorvastatin (LIPITOR) 40 MG tablet, Take 40 mg by mouth., Disp: , Rfl:  .  azelastine (ASTELIN) 0.1 % nasal spray, 1 spray by Each Nare route Two (2) times a day., Disp: , Rfl:  .  budesonide-formoterol (SYMBICORT) 160-4.5 MCG/ACT inhaler, Inhale into the lungs., Disp: , Rfl:  .  fluocinonide cream (LIDEX) 0.05 %, Apply topically., Disp: , Rfl:  .  fluticasone (FLONASE) 50 MCG/ACT nasal spray, 2 sprays by Each Nare route daily., Disp: , Rfl:  .  glipiZIDE (GLUCOTROL) 5 MG tablet, Take 5 mg by mouth. IS taking 2.5 mg 2 times a day, Disp: , Rfl:  .  lisinopril (PRINIVIL,ZESTRIL) 2.5 MG tablet, Take by mouth., Disp: , Rfl:  .  metoprolol tartrate (LOPRESSOR) 25 MG tablet, Take by mouth., Disp: , Rfl:  .  montelukast (SINGULAIR) 10 MG tablet, Take by mouth., Disp: , Rfl:   Past Medical History: No past medical history on file.  Tobacco Use: History  Smoking status  . Not on file  Smokeless tobacco  . Not on file    Labs: Recent Review Flowsheet Data    There is no flowsheet data to display.       Exercise Target Goals:    Exercise Program Goal: Individual exercise prescription set with THRR, safety & activity barriers. Participant demonstrates ability to understand and report RPE using BORG scale, to self-measure pulse accurately, and to acknowledge the importance of the exercise prescription.  Exercise Prescription Goal: Starting with aerobic activity 30 plus  minutes a day, 3 days per week for initial exercise prescription. Provide home exercise prescription and guidelines that participant acknowledges understanding prior to discharge.  Activity Barriers & Risk Stratification:     Activity Barriers & Risk Stratification - 02/22/15 1358    Activity Barriers & Risk Stratification   Activity Barriers None   Risk Stratification High      6 Minute Walk:     6 Minute Walk      02/22/15 1356       6 Minute Walk   Phase Initial     Distance 1612 feet     Walk Time 6 minutes     Resting HR 76 bpm     Resting BP 124/70 mmHg     Max Ex. HR 108 bpm     Max Ex. BP 146/80 mmHg     RPE 11     Symptoms No        Initial Exercise Prescription:     Initial Exercise Prescription - 02/22/15 1300    Date of Initial Exercise Prescription   Date 02/22/15   Treadmill   MPH 3   Grade 0   Minutes 15   Bike   Level 1.5   Minutes 15   Recumbant Bike   Level 3   Watts 30   Minutes 15   NuStep   Level 3   Watts 40   Minutes 15   Arm Ergometer   Level 2  Watts 15   Minutes 15   Arm/Foot Ergometer   Level 2   Watts 20   Minutes 15   Cybex   Level 2   RPM 20   Minutes 15   Recumbant Elliptical   Level 1   Watts 20   Minutes 15   Elliptical   Level 1   Speed 3   Minutes 5   REL-XR   Level 3   Watts 30   Minutes 15   Prescription Details   Frequency (times per week) 3   Duration Progress to 30 minutes of continuous aerobic without signs/symptoms of physical distress   Intensity   THRR REST +  30   Ratings of Perceived Exertion 11-15   Progression Continue progressive overload as per policy without signs/symptoms or physical distress.   Resistance Training   Training Prescription Yes   Weight 3   Reps 10-12      Exercise Prescription Changes:     Exercise Prescription Changes      03/02/15 0700           Exercise Review   Progression No  First day of exercise       Response to Exercise   Blood Pressure  (Admit) 124/82 mmHg       Blood Pressure (Exercise) 118/64 mmHg       Blood Pressure (Exit) 104/64 mmHg       Heart Rate (Admit) 79 bpm       Heart Rate (Exercise) 101 bpm       Heart Rate (Exit) 80 bpm       Rating of Perceived Exertion (Exercise) 11       Symptoms None       Comments First day of exercise! Patient was oriented to the gym and the equipment functions and settings. Procedures and policies of the gym were outlined and explained. The patient's individual exercise prescription and treatment plan were reviewed with them. All starting workloads were established based on the results of the functional testing  done at the initial intake visit. The plan for exercise progression was also introduced and progression will be customized based on the patient's performance and goals.        Duration Progress to 30 minutes of continuous aerobic without signs/symptoms of physical distress       Intensity Rest + 30       Progression Continue progressive overload as per policy without signs/symptoms or physical distress.       Resistance Training   Training Prescription Yes       Weight 2       Reps 10-15       Interval Training   Interval Training No       Treadmill   MPH 2.5       Grade 0       Minutes 15       NuStep   Level 3       Watts 60       Minutes 20          Discharge Exercise Prescription (Final Exercise Prescription Changes):     Exercise Prescription Changes - 03/02/15 0700    Exercise Review   Progression No  First day of exercise   Response to Exercise   Blood Pressure (Admit) 124/82 mmHg   Blood Pressure (Exercise) 118/64 mmHg   Blood Pressure (Exit) 104/64 mmHg   Heart Rate (Admit) 79 bpm   Heart Rate (Exercise) 101  bpm   Heart Rate (Exit) 80 bpm   Rating of Perceived Exertion (Exercise) 11   Symptoms None   Comments First day of exercise! Patient was oriented to the gym and the equipment functions and settings. Procedures and policies of the gym were  outlined and explained. The patient's individual exercise prescription and treatment plan were reviewed with them. All starting workloads were established based on the results of the functional testing  done at the initial intake visit. The plan for exercise progression was also introduced and progression will be customized based on the patient's performance and goals.    Duration Progress to 30 minutes of continuous aerobic without signs/symptoms of physical distress   Intensity Rest + 30   Progression Continue progressive overload as per policy without signs/symptoms or physical distress.   Resistance Training   Training Prescription Yes   Weight 2   Reps 10-15   Interval Training   Interval Training No   Treadmill   MPH 2.5   Grade 0   Minutes 15   NuStep   Level 3   Watts 60   Minutes 20      Nutrition:  Target Goals: Understanding of nutrition guidelines, daily intake of sodium <1500mg, cholesterol <200mg, calories 30% from fat and 7% or less from saturated fats, daily to have 5 or more servings of fruits and vegetables.  Biometrics:     Pre Biometrics - 02/22/15 1401    Pre Biometrics   Height 5' 9" (1.753 m)   Weight 233 lb 6.4 oz (105.87 kg)   Waist Circumference 47.5 inches   Hip Circumference 45 inches   Waist to Hip Ratio 1.06 %   BMI (Calculated) 34.5       Nutrition Therapy Plan and Nutrition Goals:   Nutrition Discharge: Rate Your Plate Scores:   Nutrition Goals Re-Evaluation:     Nutrition Goals Re-Evaluation      03/25/15 1428           Personal Goal #1 Re-Evaluation   Personal Goal #1 WIfe said Dustin Blackburn will be out of town tomorrow also so his registered dietician appt was cancelled by me.        Goal Progress Seen Yes          Psychosocial: Target Goals: Acknowledge presence or absence of depression, maximize coping skills, provide positive support system. Participant is able to verbalize types and ability to use techniques and skills  needed for reducing stress and depression.  Initial Review & Psychosocial Screening:     Initial Psych Review & Screening - 02/22/15 1501    Family Dynamics   Good Support System? Yes   Barriers   Psychosocial barriers to participate in program There are no identifiable barriers or psychosocial needs.;The patient should benefit from training in stress management and relaxation.   Screening Interventions   Interventions Encouraged to exercise;Program counselor consult      Quality of Life Scores:     Quality of Life - 02/22/15 1501    Quality of Life Scores   Health/Function Pre 25.13 %   Socioeconomic Pre 28 %   Psych/Spiritual Pre 28.29 %   Family Pre 30 %   GLOBAL Pre 27.11 %      PHQ-9:     Recent Review Flowsheet Data    Depression screen PHQ 2/9 03/01/2015 02/22/2015   Decreased Interest 0 0   Down, Depressed, Hopeless 0 0   PHQ - 2 Score 0 0   Altered sleeping   1 0   Tired, decreased energy 1 1   Change in appetite 0 0   Feeling bad or failure about yourself  0 0   Trouble concentrating 0 0   Moving slowly or fidgety/restless 0 0   Suicidal thoughts 0 0   PHQ-9 Score 2 1   Difficult doing work/chores Not difficult at all Somewhat difficult      Psychosocial Evaluation and Intervention:     Psychosocial Evaluation - 03/01/15 1027    Psychosocial Evaluation & Interventions   Interventions Stress management education;Relaxation education;Encouraged to exercise with the program and follow exercise prescription   Comments Counselor met today with Dustin Blackburn.  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  He has a strong support system with a spouse of 47 years and (32) adult daughters, some of which live close by.  He states he has a hard time staying asleep and has tried several over the counter medications to treat this without success.  He reports his appetite is "too good" and he denies past or current symptoms of depression or anxiety.  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  His goals are to lose weight, decrease his sugar levels, and improve his quality of life in this program.  Since Dustin Blackburn prefers to treat medical issues "naturally" counselor recommended considering an OTC sleep aid that is sustained release (Melatonin) and checking with his Dr. or pharmacist about this. Dustin Blackburn is encouraged to exercise consistently and to complete this program.      Continued Psychosocial Services Needed Yes  Dustin Blackburn will benefit from a possible OTC sleep aid, from meeting with the dietician to address his weight loss goals, and consistent exercise.       Psychosocial Re-Evaluation:     Psychosocial Re-Evaluation      03/25/15 1429           Psychosocial Re-Evaluation   Interventions Encouraged to attend Cardiac Rehabilitation for the exercise       Comments I called and spoke to his wife and she didn't think he was coming back to Cardiac Rehab but she wanted [place] to talk with him and "for him to tell you that". He is currently out of town but I asked her to please have him call us either way.           Vocational Rehabilitation: Provide vocational rehab assistance to qualifying candidates.   Vocational Rehab Evaluation & Intervention:     Vocational Rehab - 02/22/15 1359    Initial Vocational Rehab Evaluation & Intervention   Assessment shows need for Vocational Rehabilitation No      Education: Education Goals: Education classes will be provided on a weekly basis, covering required topics. Participant will state understanding/return demonstration of topics presented.  Learning Barriers/Preferences:     Learning Barriers/Preferences - 02/22/15 1359    Learning Barriers/Preferences   Learning Barriers Hearing   Learning Preferences None      Education Topics: General Nutrition Guidelines/Fats and  Fiber: -Group instruction provided by verbal, written material, models and posters to present the general guidelines for heart healthy nutrition. Gives an explanation and review of dietary fats and fiber.   Controlling Sodium/Reading Food Labels: -Group verbal and written material supporting the discussion of sodium use in heart healthy nutrition. Review and explanation  with models, verbal and written materials for utilization of the food label.   Exercise Physiology & Risk Factors: - Group verbal and written instruction with models to review the exercise physiology of the cardiovascular system and associated critical values. Details cardiovascular disease risk factors and the goals associated with each risk factor.   Aerobic Exercise & Resistance Training: - Gives group verbal and written discussion on the health impact of inactivity. On the components of aerobic and resistive training programs and the benefits of this training and how to safely progress through these programs.   Flexibility, Balance, General Exercise Guidelines: - Provides group verbal and written instruction on the benefits of flexibility and balance training programs. Provides general exercise guidelines with specific guidelines to those with heart or lung disease. Demonstration and skill practice provided.   Stress Management: - Provides group verbal and written instruction about the health risks of elevated stress, cause of high stress, and healthy ways to reduce stress.   Depression: - Provides group verbal and written instruction on the correlation between heart/lung disease and depressed mood, treatment options, and the stigmas associated with seeking treatment.   Anatomy & Physiology of the Heart: - Group verbal and written instruction and models provide basic cardiac anatomy and physiology, with the coronary electrical and arterial systems. Review of: AMI, Angina, Valve disease, Heart Failure, Cardiac  Arrhythmia, Pacemakers, and the ICD.   Cardiac Procedures: - Group verbal and written instruction and models to describe the testing methods done to diagnose heart disease. Reviews the outcomes of the test results. Describes the treatment choices: Medical Management, Angioplasty, or Coronary Bypass Surgery.   Cardiac Medications: - Group verbal and written instruction to review commonly prescribed medications for heart disease. Reviews the medication, class of the drug, and side effects. Includes the steps to properly store meds and maintain the prescription regimen.   Go Sex-Intimacy & Heart Disease, Get SMART - Goal Setting: - Group verbal and written instruction through game format to discuss heart disease and the return to sexual intimacy. Provides group verbal and written material to discuss and apply goal setting through the application of the S.M.A.R.T. Method.   Other Matters of the Heart: - Provides group verbal, written materials and models to describe Heart Failure, Angina, Valve Disease, and Diabetes in the realm of heart disease. Includes description of the disease process and treatment options available to the cardiac patient.          Cardiac Rehab from 03/03/2015 in [facility]   Date  03/03/15   Educator  MJA   Instruction Review Code  2- meets goals/outcomes      Exercise & Equipment Safety: - Individual verbal instruction and demonstration of equipment use and safety with use of the equipment.      Cardiac Rehab from 03/03/2015 in [facility]   Date  02/22/15   Educator  SB   Instruction Review Code  2- meets goals/outcomes      Infection Prevention: - Provides verbal and written material to individual with discussion of infection control including proper hand washing and proper equipment cleaning during exercise session.      Cardiac Rehab from 03/03/2015 in [facility]   Date  02/22/15   Educator  SB   Instruction Review Code  2- meets  goals/outcomes      Falls Prevention: - Provides verbal and written material to individual with discussion of falls prevention and safety.      Cardiac Rehab from 03/03/2015   in [facility]   Date  02/22/15   Educator  SB   Instruction Review Code  2- meets goals/outcomes      Diabetes: - Individual verbal and written instruction to review signs/symptoms of diabetes, desired ranges of glucose level fasting, after meals and with exercise. Advice that pre and post exercise glucose checks will be done for 3 sessions at entry of program.      Cardiac Rehab from 03/03/2015 in [facility]   Date  02/22/15   Educator  SB   Instruction Review Code  2- meets goals/outcomes       Knowledge Questionnaire Score:     Knowledge Questionnaire Score - 02/22/15 1359    Knowledge Questionnaire Score   Pre Score 19/28      Personal Goals and Risk Factors at Admission:     Personal Goals and Risk Factors at Admission - 02/22/15 1402    Personal Goals and Risk Factors on Admission    Weight Management Yes;Obesity   Intervention Learn and follow the exercise and diet guidelines while in the program. Utilize the nutrition and education classes to help gain knowledge of the diet and exercise expectations in the program   Intervention Provide weight management tools through evaluation completed by registered dietician and exercise physiologist.  Establish a goal weight with participant.   Goal Weight 190 lb (86.183 kg)  Dustin Blackburn wants to get weight down to less than 200 pounds. He would like to get to 190 pounds. Dustin Blackburn will meet with RD  for  nutrtion guidelines and work with the Ex Phys for his exercise progression.    Diabetes Yes  Dustin Blackburn has been on several medication s for his diabets. He is currently on Glutrol with AM fasting BLood sugar readings120-130. He cannot tolerate several of the medications.   Goal Blood glucose control identified by blood glucose values, HgbA1C. Participant  verbalizes understanding of the signs/symptoms of hyper/hypo glycemia, proper foot care and importance of medication and nutrition plan for blood glucose control.   Intervention Provide nutrition & aerobic exercise along with prescribed medications to achieve blood glucose in normal ranges: Fasting 65-99 mg/dL   Lipids Yes  Dustin Blackburn has stopped his astorvastatin. He has a holistic physician that has Dustin Blackburn on natural medication supplements and IV Chelation for Cholesterol control.  He expects to have labwork soon.    Goal Cholesterol controlled with medications as prescribed, with individualized exercise RX and with personalized nutrition plan. Value goals: LDL < 70mg, HDL > 40mg. Participant states understanding of desired cholesterol values and following prescriptions.   Intervention Provide nutrition & aerobic exercise along with prescribed medications to achieve LDL <70mg, HDL >40mg.      Personal Goals and Risk Factors Review:      Goals and Risk Factor Review      03/03/15 0942 03/25/15 1428         Weight Management   Goals Progress/Improvement seen No       Comments Has only been in program short time, no changed noted.  Has appt. with dietician. Unable to return to Cardiac rehab lately.          Personal Goals Discharge (Final Personal Goals and Risk Factors Review):      Goals and Risk Factor Review - 03/25/15 1428    Weight Management   Comments Unable to return to Cardiac rehab lately.        Comments: I called and spoke to his wife and she didn't   think he was coming back to Cardiac Rehab but she wanted us to talk with him and "for him to tell you that". He is currently out of town but I asked her to please have him call us either way.   

## 2015-03-25 NOTE — Telephone Encounter (Signed)
I called and spoke to his wife and she didn't think he was coming back to Cardiac Rehab but she wanted [place] to talk with him and "for him to tell you that". He is currently out of town but I asked her to please have him call us either way.

## 2015-03-29 ENCOUNTER — Encounter: Payer: Self-pay | Admitting: *Deleted

## 2015-03-30 ENCOUNTER — Encounter: Payer: Self-pay | Admitting: *Deleted

## 2015-03-31 ENCOUNTER — Encounter: Payer: Medicare Other | Attending: Cardiology

## 2015-03-31 DIAGNOSIS — I2111 ST elevation (STEMI) myocardial infarction involving right coronary artery: Secondary | ICD-10-CM | POA: Insufficient documentation

## 2015-03-31 DIAGNOSIS — Z9861 Coronary angioplasty status: Secondary | ICD-10-CM | POA: Insufficient documentation

## 2015-04-05 ENCOUNTER — Encounter: Payer: Self-pay | Admitting: *Deleted

## 2015-04-05 DIAGNOSIS — Z9861 Coronary angioplasty status: Secondary | ICD-10-CM

## 2015-04-05 DIAGNOSIS — I2111 ST elevation (STEMI) myocardial infarction involving right coronary artery: Secondary | ICD-10-CM

## 2015-04-05 NOTE — Progress Notes (Signed)
Cardiac Individual Treatment Plan  Patient Details  Name: Dustin Blackburn MRN: [id number] Date of Birth: 07-Dec-1940 Referring Provider:  Creta Levin, MD  Initial Encounter Date:    Visit Diagnosis: ST elevation myocardial infarction involving right coronary artery ([place])  S/P PTCA (percutaneous transluminal coronary angioplasty)  Patient's Home Medications on Admission:  Current outpatient prescriptions:  .  albuterol (PROVENTIL) (2.5 MG/3ML) 0.083% nebulizer solution, 2.5 mg., Disp: , Rfl:  .  aspirin EC 81 MG tablet, Take 81 mg by mouth., Disp: , Rfl:  .  atorvastatin (LIPITOR) 40 MG tablet, Take 40 mg by mouth., Disp: , Rfl:  .  azelastine (ASTELIN) 0.1 % nasal spray, 1 spray by Each Nare route Two (2) times a day., Disp: , Rfl:  .  budesonide-formoterol (SYMBICORT) 160-4.5 MCG/ACT inhaler, Inhale into the lungs., Disp: , Rfl:  .  fluocinonide cream (LIDEX) 0.05 %, Apply topically., Disp: , Rfl:  .  fluticasone (FLONASE) 50 MCG/ACT nasal spray, 2 sprays by Each Nare route daily., Disp: , Rfl:  .  glipiZIDE (GLUCOTROL) 5 MG tablet, Take 5 mg by mouth. IS taking 2.5 mg 2 times a day, Disp: , Rfl:  .  lisinopril (PRINIVIL,ZESTRIL) 2.5 MG tablet, Take by mouth., Disp: , Rfl:  .  metoprolol tartrate (LOPRESSOR) 25 MG tablet, Take by mouth., Disp: , Rfl:  .  montelukast (SINGULAIR) 10 MG tablet, Take by mouth., Disp: , Rfl:   Past Medical History: No past medical history on file.  Tobacco Use: History  Smoking status  . Not on file  Smokeless tobacco  . Not on file    Labs: Recent Review Flowsheet Data    There is no flowsheet data to display.       Exercise Target Goals:    Exercise Program Goal: Individual exercise prescription set with THRR, safety & activity barriers. Participant demonstrates ability to understand and report RPE using BORG scale, to self-measure pulse accurately, and to acknowledge the importance of the exercise prescription.  Exercise  Prescription Goal: Starting with aerobic activity 30 plus minutes a day, 3 days per week for initial exercise prescription. Provide home exercise prescription and guidelines that participant acknowledges understanding prior to discharge.  Activity Barriers & Risk Stratification:     Activity Barriers & Risk Stratification - 02/22/15 1358    Activity Barriers & Risk Stratification   Activity Barriers None   Risk Stratification High      6 Minute Walk:     6 Minute Walk      02/22/15 1356       6 Minute Walk   Phase Initial     Distance 1612 feet     Walk Time 6 minutes     Resting HR 76 bpm     Resting BP 124/70 mmHg     Max Ex. HR 108 bpm     Max Ex. BP 146/80 mmHg     RPE 11     Symptoms No        Initial Exercise Prescription:     Initial Exercise Prescription - 02/22/15 1300    Date of Initial Exercise Prescription   Date 02/22/15   Treadmill   MPH 3   Grade 0   Minutes 15   Bike   Level 1.5   Minutes 15   Recumbant Bike   Level 3   Watts 30   Minutes 15   NuStep   Level 3   Watts 40   Minutes 15  Arm Ergometer   Level 2   Watts 15   Minutes 15   Arm/Foot Ergometer   Level 2   Watts 20   Minutes 15   Cybex   Level 2   RPM 20   Minutes 15   Recumbant Elliptical   Level 1   Watts 20   Minutes 15   Elliptical   Level 1   Speed 3   Minutes 5   REL-XR   Level 3   Watts 30   Minutes 15   Prescription Details   Frequency (times per week) 3   Duration Progress to 30 minutes of continuous aerobic without signs/symptoms of physical distress   Intensity   THRR REST +  30   Ratings of Perceived Exertion 11-15   Progression Continue progressive overload as per policy without signs/symptoms or physical distress.   Resistance Training   Training Prescription Yes   Weight 3   Reps 10-12      Exercise Prescription Changes:     Exercise Prescription Changes      03/02/15 0700 03/29/15 1000 03/30/15 0700       Exercise Review    Progression No  First day of exercise Yes  First day of exercise No  Absent since last review; last visit 03/03/15     Response to Exercise   Blood Pressure (Admit) 124/82 mmHg 118/66 mmHg      Blood Pressure (Exercise) 118/64 mmHg 194/74 mmHg      Blood Pressure (Exit) 104/64 mmHg 138/62 mmHg      Heart Rate (Admit) 79 bpm       Heart Rate (Exercise) 101 bpm 110 bpm      Heart Rate (Exit) 80 bpm 70 bpm      Rating of Perceived Exertion (Exercise) 11 13      Symptoms None None      Comments First day of exercise! Patient was oriented to the gym and the equipment functions and settings. Procedures and policies of the gym were outlined and explained. The patient's individual exercise prescription and treatment plan were reviewed with them. All starting workloads were established based on the results of the functional testing  done at the initial intake visit. The plan for exercise progression was also introduced and progression will be customized based on the patient's performance and goals.  Reviewed individualized exercise prescription and made increases per departmental policy. Exercise increases were discussed with the patient and they were able to perform the new work loads without issue (no signs or symptoms).       Duration Progress to 30 minutes of continuous aerobic without signs/symptoms of physical distress Progress to 30 minutes of continuous aerobic without signs/symptoms of physical distress      Intensity Rest + 30 Rest + 30      Progression Continue progressive overload as per policy without signs/symptoms or physical distress. Continue progressive overload as per policy without signs/symptoms or physical distress.      Resistance Training   Training Prescription Yes Yes      Weight 2 2      Reps 10-15 10-15      Interval Training   Interval Training No No      Treadmill   MPH 2.5 2.7      Grade 0 0      Minutes 15 15      NuStep   Level 3 3      Watts 60 60  Minutes 20  20         Discharge Exercise Prescription (Final Exercise Prescription Changes):     Exercise Prescription Changes - 03/30/15 0700    Exercise Review   Progression No  Absent since last review; last visit 03/03/15      Nutrition:  Target Goals: Understanding of nutrition guidelines, daily intake of sodium <1562m, cholesterol <2065m calories 30% from fat and 7% or less from saturated fats, daily to have 5 or more servings of fruits and vegetables.  Biometrics:     Pre Biometrics - 02/22/15 1401    Pre Biometrics   Height '5\' 9"'  (1.753 m)   Weight 233 lb 6.4 oz (105.87 kg)   Waist Circumference 47.5 inches   Hip Circumference 45 inches   Waist to Hip Ratio 1.06 %   BMI (Calculated) 34.5       Nutrition Therapy Plan and Nutrition Goals:   Nutrition Discharge: Rate Your Plate Scores:   Nutrition Goals Re-Evaluation:     Nutrition Goals Re-Evaluation      03/25/15 1428           Personal Goal #1 Re-Evaluation   Personal Goal #1 WIfe said WiAbdulrahmanill be out of town tomorrow also so his registered dietician appt was cancelled by me.        Goal Progress Seen Yes          Psychosocial: Target Goals: Acknowledge presence or absence of depression, maximize coping skills, provide positive support system. Participant is able to verbalize types and ability to use techniques and skills needed for reducing stress and depression.  Initial Review & Psychosocial Screening:     Initial Psych Review & Screening - 02/22/15 1501    Family Dynamics   Good Support System? Yes   Barriers   Psychosocial barriers to participate in program There are no identifiable barriers or psychosocial needs.;The patient should benefit from training in stress management and relaxation.   Screening Interventions   Interventions Encouraged to exercise;Program counselor consult      Quality of Life Scores:     Quality of Life - 02/22/15 1501    Quality of Life Scores   Health/Function  Pre 25.13 %   Socioeconomic Pre 28 %   Psych/Spiritual Pre 28.29 %   Family Pre 30 %   GLOBAL Pre 27.11 %      PHQ-9:     Recent Review Flowsheet Data    [facility] 03/01/2015 02/22/2015   Decreased Interest 0 0   Down, Depressed, Hopeless 0 0   PHQ - 2 Score 0 0   Altered sleeping 1 0   Tired, decreased energy 1 1   Change in appetite 0 0   Feeling bad or failure about yourself  0 0   Trouble concentrating 0 0   Moving slowly or fidgety/restless 0 0   Suicidal thoughts 0 0   PHQ-9 Score 2 1   Difficult doing work/chores Not difficult at all Somewhat difficult      Psychosocial Evaluation and Intervention:     Psychosocial Evaluation - 03/01/15 1027    Psychosocial Evaluation & Interventions   Interventions Stress management education;Relaxation education;Encouraged to exercise with the program and follow exercise prescription   Comments Counselor met today with Mr. FoMastel He will be 89ears old next month and is a faPsychologist, sport and exercise He has a strong support system with a spouse of [phone] and [phone] daughters, some of which live  close by.  He states he has a hard time staying asleep and has tried several over the counter medications to treat this without success.  He reports his appetite is "too good" and he denies past or current symptoms of depression or anxiety.  Mr. 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  His goals are to lose weight, decrease his sugar levels, and improve his quality of life in this program.  Since Mr. Difonzo prefers to treat medical issues "naturally" counselor recommended considering an OTC sleep aid that is sustained release (Melatonin) and checking with his Dr. or pharmacist about this. Mr. Hackbart is encouraged to exercise consistently and to complete this program.      Continued Psychosocial Services Needed Yes  Mr. Stuard will  benefit from a possible OTC sleep aid, from meeting with the dietician to address his weight loss goals, and consistent exercise.       Psychosocial Re-Evaluation:     Psychosocial Re-Evaluation      03/25/15 1429           Psychosocial Re-Evaluation   Interventions Encouraged to attend Cardiac Rehabilitation for the exercise       Comments I called and spoke to his wife and she didn't think he was coming back to Cardiac Rehab but she wanted [place] to talk with him and "for him to tell you that". He is currently out of town but I asked her to please have him call us either way.           Vocational Rehabilitation: Provide vocational rehab assistance to qualifying candidates.   Vocational Rehab Evaluation & Intervention:     Vocational Rehab - 02/22/15 1359    Initial Vocational Rehab Evaluation & Intervention   Assessment shows need for Vocational Rehabilitation No      Education: Education Goals: Education classes will be provided on a weekly basis, covering required topics. Participant will state understanding/return demonstration of topics presented.  Learning Barriers/Preferences:     Learning Barriers/Preferences - 02/22/15 1359    Learning Barriers/Preferences   Learning Barriers Hearing   Learning Preferences None      Education Topics: General Nutrition Guidelines/Fats and Fiber: -Group instruction provided by verbal, written material, models and posters to present the general guidelines for heart healthy nutrition. Gives an explanation and review of dietary fats and fiber.   Controlling Sodium/Reading Food Labels: -Group verbal and written material supporting the discussion of sodium use in heart healthy nutrition. Review and explanation with models, verbal and written materials for utilization of the food label.   Exercise Physiology & Risk Factors: - Group verbal and written instruction with models to review the exercise physiology of the cardiovascular  system and associated critical values. Details cardiovascular disease risk factors and the goals associated with each risk factor.   Aerobic Exercise & Resistance Training: - Gives group verbal and written discussion on the health impact of inactivity. On the components of aerobic and resistive training programs and the benefits of this training and how to safely progress through these programs.   Flexibility, Balance, General Exercise Guidelines: - Provides group verbal and written instruction on the benefits of flexibility and balance training programs. Provides general exercise guidelines with specific guidelines to those with heart or lung disease. Demonstration and skill practice provided.   Stress Management: - Provides group verbal and written  instruction about the health risks of elevated stress, cause of high stress, and healthy ways to reduce stress.   Depression: - Provides group verbal and written instruction on the correlation between heart/lung disease and depressed mood, treatment options, and the stigmas associated with seeking treatment.   Anatomy & Physiology of the Heart: - Group verbal and written instruction and models provide basic cardiac anatomy and physiology, with the coronary electrical and arterial systems. Review of: AMI, Angina, Valve disease, Heart Failure, Cardiac Arrhythmia, Pacemakers, and the ICD.   Cardiac Procedures: - Group verbal and written instruction and models to describe the testing methods done to diagnose heart disease. Reviews the outcomes of the test results. Describes the treatment choices: Medical Management, Angioplasty, or Coronary Bypass Surgery.   Cardiac Medications: - Group verbal and written instruction to review commonly prescribed medications for heart disease. Reviews the medication, class of the drug, and side effects. Includes the steps to properly store meds and maintain the prescription regimen.   Go Sex-Intimacy & Heart  Disease, Get SMART - Goal Setting: - Group verbal and written instruction through game format to discuss heart disease and the return to sexual intimacy. Provides group verbal and written material to discuss and apply goal setting through the application of the S.M.A.R.T. Method.   Other Matters of the Heart: - Provides group verbal, written materials and models to describe Heart Failure, Angina, Valve Disease, and Diabetes in the realm of heart disease. Includes description of the disease process and treatment options available to the cardiac patient.          Cardiac Rehab from 03/03/2015 in [facility] Cardiac Rehab   Date  03/03/15   Educator  [place]   Instruction Review Code  2- meets goals/outcomes      Exercise & Equipment Safety: - Individual verbal instruction and demonstration of equipment use and safety with use of the equipment.      Cardiac Rehab from 03/03/2015 in [facility] Cardiac Rehab   Date  02/22/15   Educator  SB   Instruction Review Code  2- meets goals/outcomes      Infection Prevention: - Provides verbal and written material to individual with discussion of infection control including proper hand washing and proper equipment cleaning during exercise session.      Cardiac Rehab from 03/03/2015 in [facility] Cardiac Rehab   Date  02/22/15   Educator  SB   Instruction Review Code  2- meets goals/outcomes      Falls Prevention: - Provides verbal and written material to individual with discussion of falls prevention and safety.      Cardiac Rehab from 03/03/2015 in [facility] Cardiac Rehab   Date  02/22/15   Educator  SB   Instruction Review Code  2- meets goals/outcomes      Diabetes: - Individual verbal and written instruction to review signs/symptoms of diabetes, desired ranges of glucose level fasting, after meals and with exercise. Advice that pre and post exercise glucose checks will be done for 3 sessions at entry of program.      Cardiac Rehab from 03/03/2015 in [facility] Cardiac  Rehab   Date  02/22/15   Educator  SB   Instruction Review Code  2- meets goals/outcomes       Knowledge Questionnaire Score:     Knowledge Questionnaire Score - 02/22/15 1359    Knowledge Questionnaire Score   Pre Score 19/28      Personal Goals and Risk Factors at Admission:     Personal  Goals and Risk Factors at Admission - 02/22/15 1402    Personal Goals and Risk Factors on Admission    Weight Management Yes;Obesity   Intervention Learn and follow the exercise and diet guidelines while in the program. Utilize the nutrition and education classes to help gain knowledge of the diet and exercise expectations in the program   Intervention Provide weight management tools through evaluation completed by registered dietician and exercise physiologist.  Establish a goal weight with participant.   Goal Weight 190 lb (86.183 kg)  Billy wants to get weight down to less than 200 pounds. He would like to get to 190 pounds. Abe People will meet with RD  for  nutrtion guidelines and work with the Ex Phys for his exercise progression.    Diabetes Yes  Abe People has been on several medication s for his diabets. He is currently on Glutrol with AM fasting BLood sugar readings120-130. He cannot tolerate several of the medications.   Goal Blood glucose control identified by blood glucose values, HgbA1C. Participant verbalizes understanding of the signs/symptoms of hyper/hypo glycemia, proper foot care and importance of medication and nutrition plan for blood glucose control.   Intervention Provide nutrition & aerobic exercise along with prescribed medications to achieve blood glucose in normal ranges: Fasting 65-99 mg/dL   Lipids Yes  Abe People has stopped his astorvastatin. He has a holistic physician that has [place] on natural medication supplements and IV Chelation for Cholesterol control.  He expects to have labwork soon.    Goal Cholesterol controlled with medications as prescribed, with individualized  exercise RX and with personalized nutrition plan. Value goals: LDL < 54m, HDL > 473m Participant states understanding of desired cholesterol values and following prescriptions.   Intervention Provide nutrition & aerobic exercise along with prescribed medications to achieve LDL <7064mHDL >10m48m    Personal Goals and Risk Factors Review:      Goals and Risk Factor Review      03/03/15 0942 03/25/15 1428         Weight Management   Goals Progress/Improvement seen No       Comments Has only been in program short time, no changed noted.  Has appt. with dietician. Unable to return to Cardiac rehab lately.          Personal Goals Discharge (Final Personal Goals and Risk Factors Review):      Goals and Risk Factor Review - 03/25/15 1428    Weight Management   Comments Unable to return to Cardiac rehab lately.        Comments: 30 day review Has been out.

## 2015-04-06 ENCOUNTER — Telehealth: Payer: Self-pay | Admitting: *Deleted

## 2015-04-06 NOTE — Telephone Encounter (Signed)
Called to check on status to return to program. Left message on machine.  

## 2015-04-07 ENCOUNTER — Other Ambulatory Visit: Payer: Self-pay | Admitting: *Deleted

## 2015-04-07 DIAGNOSIS — Z9861 Coronary angioplasty status: Secondary | ICD-10-CM

## 2015-04-07 DIAGNOSIS — I2111 ST elevation (STEMI) myocardial infarction involving right coronary artery: Secondary | ICD-10-CM

## 2015-04-08 ENCOUNTER — Encounter: Payer: Self-pay | Admitting: *Deleted

## 2015-04-08 DIAGNOSIS — Z9861 Coronary angioplasty status: Secondary | ICD-10-CM

## 2015-04-08 NOTE — Progress Notes (Signed)
Discharge Summary  Patient Details  Name: Dustin Blackburn MRN: [id number] Date of Birth: 1941/05/10 Referring Provider:  No ref. provider found   Number of Visits: 4  Reason for Discharge:  Early Exit:  Personal  Smoking History:  History  Smoking status  . Not on file  Smokeless tobacco  . Not on file    Diagnosis:  S/P PTCA (percutaneous transluminal coronary angioplasty)  ADL UCSD:   Initial Exercise Prescription:     Initial Exercise Prescription - 02/22/15 1300    Date of Initial Exercise Prescription   Date 02/22/15   Treadmill   MPH 3   Grade 0   Minutes 15   Bike   Level 1.5   Minutes 15   Recumbant Bike   Level 3   Watts 30   Minutes 15   NuStep   Level 3   Watts 40   Minutes 15   Arm Ergometer   Level 2   Watts 15   Minutes 15   Arm/Foot Ergometer   Level 2   Watts 20   Minutes 15   Cybex   Level 2   RPM 20   Minutes 15   Recumbant Elliptical   Level 1   Watts 20   Minutes 15   Elliptical   Level 1   Speed 3   Minutes 5   REL-XR   Level 3   Watts 30   Minutes 15   Prescription Details   Frequency (times per week) 3   Duration Progress to 30 minutes of continuous aerobic without signs/symptoms of physical distress   Intensity   THRR REST +  30   Ratings of Perceived Exertion 11-15   Progression Continue progressive overload as per policy without signs/symptoms or physical distress.   Resistance Training   Training Prescription Yes   Weight 3   Reps 10-12      Discharge Exercise Prescription (Final Exercise Prescription Changes):     Exercise Prescription Changes - 03/30/15 0700    Exercise Review   Progression No  Absent since last review; last visit 03/03/15      Functional Capacity:     6 Minute Walk      02/22/15 1356       6 Minute Walk   Phase Initial     Distance 1612 feet     Walk Time 6 minutes     Resting HR 76 bpm     Resting BP 124/70 mmHg     Max Ex. HR 108 bpm     Max Ex. BP 146/80 mmHg      RPE 11     Symptoms No        Psychological, QOL, Others - Outcomes: PHQ 2/9: Depression screen [facility] 2/9 03/01/2015 02/22/2015  Decreased Interest 0 0  Down, Depressed, Hopeless 0 0  PHQ - 2 Score 0 0  Altered sleeping 1 0  Tired, decreased energy 1 1  Change in appetite 0 0  Feeling bad or failure about yourself  0 0  Trouble concentrating 0 0  Moving slowly or fidgety/restless 0 0  Suicidal thoughts 0 0  PHQ-9 Score 2 1  Difficult doing work/chores Not difficult at all Somewhat difficult    Quality of Life:     Quality of Life - 02/22/15 1501    Quality of Life Scores   Health/Function Pre 25.13 %   Socioeconomic Pre 28 %   Psych/Spiritual Pre 28.29 %   Family  Pre 30 %   GLOBAL Pre 27.11 %      Personal Goals: Goals established at orientation with interventions provided to work toward goal.     Personal Goals and Risk Factors at Admission - 02/22/15 1402    Personal Goals and Risk Factors on Admission    Weight Management Yes;Obesity   Intervention Learn and follow the exercise and diet guidelines while in the program. Utilize the nutrition and education classes to help gain knowledge of the diet and exercise expectations in the program   Intervention Provide weight management tools through evaluation completed by registered dietician and exercise physiologist.  Establish a goal weight with participant.   Goal Weight 190 lb (86.183 kg)  Billy wants to get weight down to less than 200 pounds. He would like to get to 190 pounds. Abe People will meet with RD  for  nutrtion guidelines and work with the Ex Phys for his exercise progression.    Diabetes Yes  Abe People has been on several medication s for his diabets. He is currently on Glutrol with AM fasting BLood sugar readings120-130. He cannot tolerate several of the medications.   Goal Blood glucose control identified by blood glucose values, HgbA1C. Participant verbalizes understanding of the signs/symptoms of hyper/hypo  glycemia, proper foot care and importance of medication and nutrition plan for blood glucose control.   Intervention Provide nutrition & aerobic exercise along with prescribed medications to achieve blood glucose in normal ranges: Fasting 65-99 mg/dL   Lipids Yes  Abe People has stopped his astorvastatin. He has a holistic physician that has [place] on natural medication supplements and IV Chelation for Cholesterol control.  He expects to have labwork soon.    Goal Cholesterol controlled with medications as prescribed, with individualized exercise RX and with personalized nutrition plan. Value goals: LDL < 70mg , HDL > 40mg . Participant states understanding of desired cholesterol values and following prescriptions.   Intervention Provide nutrition & aerobic exercise along with prescribed medications to achieve LDL 70mg , HDL >40mg .       Personal Goals Discharge:     Goals and Risk Factor Review      03/03/15 0942 03/25/15 1428 04/08/15 1304       Weight Management   Goals Progress/Improvement seen No  No     Comments Has only been in program short time, no changed noted.  Has appt. with dietician. Unable to return to Cardiac rehab lately.       Diabetes   Goal   --  Early exit-will go to [facility] in [place].      Abnormal Lipids   Goal   --  Unknown. To follow up with his MD.         Nutrition & Weight - Outcomes:     Pre Biometrics - 02/22/15 1401    Pre Biometrics   Height 5\' 9"  (1.753 m)   Weight 233 lb 6.4 oz (105.87 kg)   Waist Circumference 47.5 inches   Hip Circumference 45 inches   Waist to Hip Ratio 1.06 %   BMI (Calculated) 34.5       Nutrition:   Nutrition Discharge:   Education Questionnaire Score:     Knowledge Questionnaire Score - 02/22/15 1359    Knowledge Questionnaire Score   Pre Score 19/28      Goals reviewed with patient; copy given to patient.

## 2015-04-08 NOTE — Progress Notes (Signed)
Cardiac Individual Treatment Plan  Patient Details  Name: Dustin Blackburn MRN: [id number] Date of Birth: 1941/05/16 Referring Provider:  Jacques Earthly, MD  Initial Encounter Date:  02/22/2015  Visit Diagnosis: S/P PTCA (percutaneous transluminal coronary angioplasty)  Patient's Home Medications on Admission:  Current outpatient prescriptions:  .  albuterol (PROVENTIL) (2.5 MG/3ML) 0.083% nebulizer solution, 2.5 mg., Disp: , Rfl:  .  aspirin EC 81 MG tablet, Take 81 mg by mouth., Disp: , Rfl:  .  atorvastatin (LIPITOR) 40 MG tablet, Take 40 mg by mouth., Disp: , Rfl:  .  azelastine (ASTELIN) 0.1 % nasal spray, 1 spray by Each Nare route Two (2) times a day., Disp: , Rfl:  .  budesonide-formoterol (SYMBICORT) 160-4.5 MCG/ACT inhaler, Inhale into the lungs., Disp: , Rfl:  .  fluocinonide cream (LIDEX) 0.05 %, Apply topically., Disp: , Rfl:  .  fluticasone (FLONASE) 50 MCG/ACT nasal spray, 2 sprays by Each Nare route daily., Disp: , Rfl:  .  glipiZIDE (GLUCOTROL) 5 MG tablet, Take 5 mg by mouth. IS taking 2.5 mg 2 times a day, Disp: , Rfl:  .  lisinopril (PRINIVIL,ZESTRIL) 2.5 MG tablet, Take by mouth., Disp: , Rfl:  .  metoprolol tartrate (LOPRESSOR) 25 MG tablet, Take by mouth., Disp: , Rfl:  .  montelukast (SINGULAIR) 10 MG tablet, Take by mouth., Disp: , Rfl:   Past Medical History: No past medical history on file.  Tobacco Use: History  Smoking status  . Not on file  Smokeless tobacco  . Not on file    Labs: Recent Review Flowsheet Data    There is no flowsheet data to display.       Exercise Target Goals:    Exercise Program Goal: Individual exercise prescription set with THRR, safety & activity barriers. Participant demonstrates ability to understand and report RPE using BORG scale, to self-measure pulse accurately, and to acknowledge the importance of the exercise prescription.  Exercise Prescription Goal: Starting with aerobic activity 30 plus minutes a day, 3  days per week for initial exercise prescription. Provide home exercise prescription and guidelines that participant acknowledges understanding prior to discharge.  Activity Barriers & Risk Stratification:     Activity Barriers & Risk Stratification - 02/22/15 1358    Activity Barriers & Risk Stratification   Activity Barriers None   Risk Stratification High      6 Minute Walk:     6 Minute Walk      02/22/15 1356       6 Minute Walk   Phase Initial     Distance 1612 feet     Walk Time 6 minutes     Resting HR 76 bpm     Resting BP 124/70 mmHg     Max Ex. HR 108 bpm     Max Ex. BP 146/80 mmHg     RPE 11     Symptoms No        Initial Exercise Prescription:     Initial Exercise Prescription - 02/22/15 1300    Date of Initial Exercise Prescription   Date 02/22/15   Treadmill   MPH 3   Grade 0   Minutes 15   Bike   Level 1.5   Minutes 15   Recumbant Bike   Level 3   Watts 30   Minutes 15   NuStep   Level 3   Watts 40   Minutes 15   Arm Ergometer   Level 2   Watts 15  Minutes 15   Arm/Foot Ergometer   Level 2   Watts 20   Minutes 15   Cybex   Level 2   RPM 20   Minutes 15   Recumbant Elliptical   Level 1   Watts 20   Minutes 15   Elliptical   Level 1   Speed 3   Minutes 5   REL-XR   Level 3   Watts 30   Minutes 15   Prescription Details   Frequency (times per week) 3   Duration Progress to 30 minutes of continuous aerobic without signs/symptoms of physical distress   Intensity   THRR REST +  30   Ratings of Perceived Exertion 11-15   Progression Continue progressive overload as per policy without signs/symptoms or physical distress.   Resistance Training   Training Prescription Yes   Weight 3   Reps 10-12      Exercise Prescription Changes:     Exercise Prescription Changes      03/02/15 0700 03/29/15 1000 03/30/15 0700       Exercise Review   Progression No  First day of exercise Yes  First day of exercise No  Absent  since last review; last visit 03/03/15     Response to Exercise   Blood Pressure (Admit) 124/82 mmHg 118/66 mmHg      Blood Pressure (Exercise) 118/64 mmHg 194/74 mmHg      Blood Pressure (Exit) 104/64 mmHg 138/62 mmHg      Heart Rate (Admit) 79 bpm       Heart Rate (Exercise) 101 bpm 110 bpm      Heart Rate (Exit) 80 bpm 70 bpm      Rating of Perceived Exertion (Exercise) 11 13      Symptoms None None      Comments First day of exercise! Patient was oriented to the gym and the equipment functions and settings. Procedures and policies of the gym were outlined and explained. The patient's individual exercise prescription and treatment plan were reviewed with them. All starting workloads were established based on the results of the functional testing  done at the initial intake visit. The plan for exercise progression was also introduced and progression will be customized based on the patient's performance and goals.  Reviewed individualized exercise prescription and made increases per departmental policy. Exercise increases were discussed with the patient and they were able to perform the new work loads without issue (no signs or symptoms).       Duration Progress to 30 minutes of continuous aerobic without signs/symptoms of physical distress Progress to 30 minutes of continuous aerobic without signs/symptoms of physical distress      Intensity Rest + 30 Rest + 30      Progression Continue progressive overload as per policy without signs/symptoms or physical distress. Continue progressive overload as per policy without signs/symptoms or physical distress.      Resistance Training   Training Prescription Yes Yes      Weight 2 2      Reps 10-15 10-15      Interval Training   Interval Training No No      Treadmill   MPH 2.5 2.7      Grade 0 0      Minutes 15 15      NuStep   Level 3 3      Watts 60 60      Minutes 20 20  Discharge Exercise Prescription (Final Exercise Prescription  Changes):     Exercise Prescription Changes - 03/30/15 0700    Exercise Review   Progression No  Absent since last review; last visit 03/03/15      Nutrition:  Target Goals: Understanding of nutrition guidelines, daily intake of sodium <1541m, cholesterol <2080m calories 30% from fat and 7% or less from saturated fats, daily to have 5 or more servings of fruits and vegetables.  Biometrics:     Pre Biometrics - 02/22/15 1401    Pre Biometrics   Height '5\' 9"'  (1.753 m)   Weight 233 lb 6.4 oz (105.87 kg)   Waist Circumference 47.5 inches   Hip Circumference 45 inches   Waist to Hip Ratio 1.06 %   BMI (Calculated) 34.5       Nutrition Therapy Plan and Nutrition Goals:   Nutrition Discharge: Rate Your Plate Scores:   Nutrition Goals Re-Evaluation:     Nutrition Goals Re-Evaluation      03/25/15 1428 04/08/15 1303         Personal Goal #1 Re-Evaluation   Personal Goal #1 WIfe said WiSheronill be out of town tomorrow also so his registered dietician appt was cancelled by me.  Early exit-will go to [facility] in [place]      Goal Progress Seen Yes          Psychosocial: Target Goals: Acknowledge presence or absence of depression, maximize coping skills, provide positive support system. Participant is able to verbalize types and ability to use techniques and skills needed for reducing stress and depression.  Initial Review & Psychosocial Screening:     Initial Psych Review & Screening - 02/22/15 1501    Family Dynamics   Good Support System? Yes   Barriers   Psychosocial barriers to participate in program There are no identifiable barriers or psychosocial needs.;The patient should benefit from training in stress management and relaxation.   Screening Interventions   Interventions Encouraged to exercise;Program counselor consult      Quality of Life Scores:     Quality of Life - 02/22/15 1501    Quality of Life Scores   Health/Function Pre 25.13 %    Socioeconomic Pre 28 %   Psych/Spiritual Pre 28.29 %   Family Pre 30 %   GLOBAL Pre 27.11 %      PHQ-9:     Recent Review Flowsheet Data    [facility] 03/01/2015 02/22/2015   Decreased Interest 0 0   Down, Depressed, Hopeless 0 0   PHQ - 2 Score 0 0   Altered sleeping 1 0   Tired, decreased energy 1 1   Change in appetite 0 0   Feeling bad or failure about yourself  0 0   Trouble concentrating 0 0   Moving slowly or fidgety/restless 0 0   Suicidal thoughts 0 0   PHQ-9 Score 2 1   Difficult doing work/chores Not difficult at all Somewhat difficult      Psychosocial Evaluation and Intervention:     Psychosocial Evaluation - 03/01/15 1027    Psychosocial Evaluation & Interventions   Interventions Stress management education;Relaxation education;Encouraged to exercise with the program and follow exercise prescription   Comments Counselor met today with Mr. FoRobin He will be [age]ears old next month and is a faPsychologist, sport and exercise He has a strong support system with a spouse of [phone] and [phone] daughters, some of which live close by.  He states he has a hard time staying asleep and has tried several over the counter medications to treat this without success.  He reports his appetite is "too good" and he denies past or current symptoms of depression or anxiety.  Mr. 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  His goals are to lose weight, decrease his sugar levels, and improve his quality of life in this program.  Since Mr. Morillo prefers to treat medical issues "naturally" counselor recommended considering an OTC sleep aid that is sustained release (Melatonin) and checking with his Dr. or pharmacist about this. Mr. Berger is encouraged to exercise consistently and to complete this program.      Continued Psychosocial Services Needed Yes  Mr. Phariss will benefit from a  possible OTC sleep aid, from meeting with the dietician to address his weight loss goals, and consistent exercise.       Psychosocial Re-Evaluation:     Psychosocial Re-Evaluation      03/25/15 1429 04/08/15 1304         Psychosocial Re-Evaluation   Interventions Encouraged to attend Cardiac Rehabilitation for the exercise       Comments I called and spoke to his wife and she didn't think he was coming back to Cardiac Rehab but she wanted [place] to talk with him and "for him to tell you that". He is currently out of town but I asked her to please have him call us either way.  Early exit-will go to [facility] in [place].          Vocational Rehabilitation: Provide vocational rehab assistance to qualifying candidates.   Vocational Rehab Evaluation & Intervention:     Vocational Rehab - 02/22/15 1359    Initial Vocational Rehab Evaluation & Intervention   Assessment shows need for Vocational Rehabilitation No      Education: Education Goals: Education classes will be provided on a weekly basis, covering required topics. Participant will state understanding/return demonstration of topics presented.  Learning Barriers/Preferences:     Learning Barriers/Preferences - 02/22/15 1359    Learning Barriers/Preferences   Learning Barriers Hearing   Learning Preferences None      Education Topics: General Nutrition Guidelines/Fats and Fiber: -Group instruction provided by verbal, written material, models and posters to present the general guidelines for heart healthy nutrition. Gives an explanation and review of dietary fats and fiber.   Controlling Sodium/Reading Food Labels: -Group verbal and written material supporting the discussion of sodium use in heart healthy nutrition. Review and explanation with models, verbal and written materials for utilization of the food label.   Exercise Physiology & Risk Factors: - Group verbal and written instruction with models to review the  exercise physiology of the cardiovascular system and associated critical values. Details cardiovascular disease risk factors and the goals associated with each risk factor.   Aerobic Exercise & Resistance Training: - Gives group verbal and written discussion on the health impact of inactivity. On the components of aerobic and resistive training programs and the benefits of this training and how to safely progress through these programs.   Flexibility, Balance, General Exercise Guidelines: - Provides group verbal and written instruction on the benefits of flexibility and balance training programs. Provides general exercise guidelines with specific guidelines to those with heart or lung disease. Demonstration and skill practice provided.   Stress Management: -  Provides group verbal and written instruction about the health risks of elevated stress, cause of high stress, and healthy ways to reduce stress.   Depression: - Provides group verbal and written instruction on the correlation between heart/lung disease and depressed mood, treatment options, and the stigmas associated with seeking treatment.   Anatomy & Physiology of the Heart: - Group verbal and written instruction and models provide basic cardiac anatomy and physiology, with the coronary electrical and arterial systems. Review of: AMI, Angina, Valve disease, Heart Failure, Cardiac Arrhythmia, Pacemakers, and the ICD.   Cardiac Procedures: - Group verbal and written instruction and models to describe the testing methods done to diagnose heart disease. Reviews the outcomes of the test results. Describes the treatment choices: Medical Management, Angioplasty, or Coronary Bypass Surgery.   Cardiac Medications: - Group verbal and written instruction to review commonly prescribed medications for heart disease. Reviews the medication, class of the drug, and side effects. Includes the steps to properly store meds and maintain the  prescription regimen.   Go Sex-Intimacy & Heart Disease, Get SMART - Goal Setting: - Group verbal and written instruction through game format to discuss heart disease and the return to sexual intimacy. Provides group verbal and written material to discuss and apply goal setting through the application of the S.M.A.R.T. Method.   Other Matters of the Heart: - Provides group verbal, written materials and models to describe Heart Failure, Angina, Valve Disease, and Diabetes in the realm of heart disease. Includes description of the disease process and treatment options available to the cardiac patient.          Cardiac Rehab from 03/03/2015 in [facility] Cardiac Rehab   Date  03/03/15   Educator  [place]   Instruction Review Code  2- meets goals/outcomes      Exercise & Equipment Safety: - Individual verbal instruction and demonstration of equipment use and safety with use of the equipment.      Cardiac Rehab from 03/03/2015 in [facility] Cardiac Rehab   Date  02/22/15   Educator  SB   Instruction Review Code  2- meets goals/outcomes      Infection Prevention: - Provides verbal and written material to individual with discussion of infection control including proper hand washing and proper equipment cleaning during exercise session.      Cardiac Rehab from 03/03/2015 in [facility] Cardiac Rehab   Date  02/22/15   Educator  SB   Instruction Review Code  2- meets goals/outcomes      Falls Prevention: - Provides verbal and written material to individual with discussion of falls prevention and safety.      Cardiac Rehab from 03/03/2015 in [facility] Cardiac Rehab   Date  02/22/15   Educator  SB   Instruction Review Code  2- meets goals/outcomes      Diabetes: - Individual verbal and written instruction to review signs/symptoms of diabetes, desired ranges of glucose level fasting, after meals and with exercise. Advice that pre and post exercise glucose checks will be done for 3 sessions at entry of program.       Cardiac Rehab from 03/03/2015 in [facility] Cardiac Rehab   Date  02/22/15   Educator  SB   Instruction Review Code  2- meets goals/outcomes       Knowledge Questionnaire Score:     Knowledge Questionnaire Score - 02/22/15 1359    Knowledge Questionnaire Score   Pre Score 19/28      Personal Goals and Risk Factors at Admission:  Personal Goals and Risk Factors at Admission - 02/22/15 1402    Personal Goals and Risk Factors on Admission    Weight Management Yes;Obesity   Intervention Learn and follow the exercise and diet guidelines while in the program. Utilize the nutrition and education classes to help gain knowledge of the diet and exercise expectations in the program   Intervention Provide weight management tools through evaluation completed by registered dietician and exercise physiologist.  Establish a goal weight with participant.   Goal Weight 190 lb (86.183 kg)  Billy wants to get weight down to less than 200 pounds. He would like to get to 190 pounds. Abe People will meet with RD  for  nutrtion guidelines and work with the Ex Phys for his exercise progression.    Diabetes Yes  Abe People has been on several medication s for his diabets. He is currently on Glutrol with AM fasting BLood sugar readings120-130. He cannot tolerate several of the medications.   Goal Blood glucose control identified by blood glucose values, HgbA1C. Participant verbalizes understanding of the signs/symptoms of hyper/hypo glycemia, proper foot care and importance of medication and nutrition plan for blood glucose control.   Intervention Provide nutrition & aerobic exercise along with prescribed medications to achieve blood glucose in normal ranges: Fasting 65-99 mg/dL   Lipids Yes  Abe People has stopped his astorvastatin. He has a holistic physician that has [place] on natural medication supplements and IV Chelation for Cholesterol control.  He expects to have labwork soon.    Goal Cholesterol controlled with  medications as prescribed, with individualized exercise RX and with personalized nutrition plan. Value goals: LDL < 62m, HDL > 425m Participant states understanding of desired cholesterol values and following prescriptions.   Intervention Provide nutrition & aerobic exercise along with prescribed medications to achieve LDL <7064mHDL >72m61m    Personal Goals and Risk Factors Review:      Goals and Risk Factor Review      03/03/15 0942 03/25/15 1428 04/08/15 1304       Weight Management   Goals Progress/Improvement seen No  No     Comments Has only been in program short time, no changed noted.  Has appt. with dietician. Unable to return to Cardiac rehab lately.       Diabetes   Goal   --  Early exit-will go to [facility] in [place].      Abnormal Lipids   Goal   --  Unknown. To follow up with his MD.         Personal Goals Discharge (Final Personal Goals and Risk Factors Review):      Goals and Risk Factor Review - 04/08/15 1304    Weight Management   Goals Progress/Improvement seen No   Diabetes   Goal --  Early exit-will go to [facility] in [place].    Abnormal Lipids   Goal --  Unknown. To follow up with his MD.        Comments: Early exit[place]

## 2016-04-05 ENCOUNTER — Encounter: Payer: Self-pay | Admitting: *Deleted

## 2016-04-13 ENCOUNTER — Ambulatory Visit (INDEPENDENT_AMBULATORY_CARE_PROVIDER_SITE_OTHER): Payer: Medicare HMO | Admitting: General Surgery

## 2016-04-13 ENCOUNTER — Encounter: Payer: Self-pay | Admitting: General Surgery

## 2016-04-13 VITALS — BP 126/62 | HR 82 | Resp 12 | Ht 70.0 in | Wt 232.0 lb

## 2016-04-13 DIAGNOSIS — K429 Umbilical hernia without obstruction or gangrene: Secondary | ICD-10-CM | POA: Diagnosis not present

## 2016-04-13 DIAGNOSIS — M6208 Separation of muscle (nontraumatic), other site: Secondary | ICD-10-CM

## 2016-04-13 NOTE — Progress Notes (Signed)
Patient ID: Dustin Blackburn, male   DOB: 02/20/1941, 75 y.o.   MRN: [id number]  Chief Complaint  Patient presents with  . Umbilical Hernia    HPI Dustin Blackburn is a 75 y.o. male here today for an evaluation of an umbilical hernia.  Patient states he first noticed this a few years ago.  He states it protrudes and is tender at times.  Moves bowels regularly.  I have reviewed the history of present illness with the patient.  HPI  Past Medical History:  Diagnosis Date  . Arthritis   . COPD (chronic obstructive pulmonary disease) ([place])   . Diabetes mellitus without complication ([place])   . GERD (gastroesophageal reflux disease)   . Heart attack 2016  . Psoriasis     Past Surgical History:  Procedure Laterality Date  . CORONARY STENT PLACEMENT  2016  . HIATAL HERNIA REPAIR  1992  . LIPOMA EXCISION      No family history on file.  Social History Social History  Substance Use Topics  . Smoking status: Former Smoker    Years: 30.00    Types: Cigarettes    Quit date: 05/29/1990  . Smokeless tobacco: Never Used  . Alcohol use No    Allergies  Allergen Reactions  . Glimepiride Rash    Other reaction(s): RASH  . Metformin Shortness Of Breath    Pt states he "can't breathe and it gives him sinus congestion". Other reaction(s): OTHER  . Amoxicillin     Other reaction(s): NAUSEA  . Erythromycin Base Nausea Only    Other reaction(s): OTHER  . Esomeprazole Magnesium Other (See Comments)    Other reaction(s): Other (See Comments) Makes patients heart burn worse Makes patients heart burn worse  . Liraglutide Other (See Comments)    bloating Other reaction(s): Other (See Comments) bloating  . Penicillins Nausea Only  . Sitagliptin Other (See Comments)    Pt states it "gives him joint pain" & redness Other reaction(s): JOINT PAIN    Current Outpatient Prescriptions  Medication Sig Dispense Refill  . albuterol (PROVENTIL) (2.5 MG/3ML) 0.083% nebulizer solution 2.5 mg.    .  aspirin EC 81 MG tablet Take 81 mg by mouth.    Marland Kitchen azelastine (ASTELIN) 0.1 % nasal spray 1 spray by Each Nare route Two (2) times a day.    . budesonide-formoterol (SYMBICORT) 160-4.5 MCG/ACT inhaler Inhale into the lungs.    . fluticasone (FLONASE) 50 MCG/ACT nasal spray 2 sprays by Each Nare route daily.    Marland Kitchen glipiZIDE (GLUCOTROL) 5 MG tablet Take 5 mg by mouth. IS taking 2.5 mg 2 times a day    . montelukast (SINGULAIR) 10 MG tablet Take by mouth.    . Prasterone (DHEA) POWD by Does not apply route.    . Testosterone Cypionate POWD by Does not apply route.     No current facility-administered medications for this visit.     Review of Systems Review of Systems  Constitutional: Negative.   Respiratory: Negative.   Cardiovascular: Negative.     Blood pressure 126/62, pulse 82, resp. rate 12, height '5\' 10"'  (1.778 m), weight 232 lb (105.2 kg).  Physical Exam Physical Exam  Constitutional: He is oriented to person, place, and time. He appears well-developed and well-nourished.  Eyes: Conjunctivae are normal. No scleral icterus.  Cardiovascular: Normal rate, regular rhythm and normal heart sounds.   Pulmonary/Chest: Effort normal and breath sounds normal.      Abdominal: Soft. Bowel sounds are normal. There  is no tenderness. A hernia (small umbilicus) is present.  Mild to moderate diastasis recti noted  Lymphadenopathy:    He has no cervical adenopathy.    He has no axillary adenopathy.  Neurological: He is alert and oriented to person, place, and time.  Skin: Skin is warm and dry.    Data Reviewed Notes  Assessment    Small umbilical hernia, non-reducible- most likely fat necrosis Diastasis recti    Plan    Hernia precautions and incarceration were discussed with the patient. If he develops symptoms of an incarcerated hernia, he is encouraged to seek prompt medical attention.  I have recommended repair of the hernia using mesh on an outpatient basis in the near  future. The risk of infection was reviewed. The role of prosthetic mesh to minimize the risk of recurrence was reviewed. Also recommended a CT given par esophageal surgery Patient is scheduled for a CT abdominal pelvis with contrast at [facility] [facility] on 04/19/16 at 9:00 am. He will arrive by 8:45 am and have nothing to eat or drink for 4 hours prior. He will pick up a prep kit today. The patient is aware of date, time, and instructions.   Follow-up in January         Lot Medford G 04/14/2016, 9:22 AM

## 2016-04-13 NOTE — Patient Instructions (Addendum)
Patient to be scheduled for CT abdomen/pelvis  Follow-up in January   Call office for issues or concerns.  Patient is scheduled for a CT abdominal pelvis with contrast at [facility] [place] [facility] on 04/19/16 at 9:00 am. He will arrive by 8:45 am and have nothing to eat or drink for 4 hours prior. He will pick up a prep kit today. The patient is aware of date, time, and instructions.   

## 2016-04-14 ENCOUNTER — Encounter: Payer: Self-pay | Admitting: General Surgery

## 2016-04-18 ENCOUNTER — Other Ambulatory Visit: Payer: Self-pay

## 2016-04-18 DIAGNOSIS — K429 Umbilical hernia without obstruction or gangrene: Secondary | ICD-10-CM

## 2016-04-19 ENCOUNTER — Other Ambulatory Visit
Admission: RE | Admit: 2016-04-19 | Discharge: 2016-04-19 | Disposition: A | Discharge status: Home / Self Care | Payer: Medicare HMO | Source: Ambulatory Visit | Attending: General Surgery | Admitting: General Surgery

## 2016-04-19 ENCOUNTER — Ambulatory Visit
Admission: RE | Admit: 2016-04-19 | Discharge: 2016-04-19 | Disposition: A | Discharge status: Home / Self Care | Payer: Medicare HMO | Source: Ambulatory Visit | Attending: General Surgery | Admitting: General Surgery

## 2016-04-19 DIAGNOSIS — K439 Ventral hernia without obstruction or gangrene: Secondary | ICD-10-CM | POA: Diagnosis not present

## 2016-04-19 DIAGNOSIS — K429 Umbilical hernia without obstruction or gangrene: Secondary | ICD-10-CM

## 2016-04-19 DIAGNOSIS — R1033 Periumbilical pain: Secondary | ICD-10-CM | POA: Diagnosis present

## 2016-04-19 DIAGNOSIS — K760 Fatty (change of) liver, not elsewhere classified: Secondary | ICD-10-CM | POA: Insufficient documentation

## 2016-04-19 HISTORY — DX: Unspecified asthma, uncomplicated: J45.909

## 2016-04-19 LAB — CREATININE, SERUM
Creatinine, Ser: 0.9 mg/dL (ref 0.61–1.24)
GFR calc Af Amer: >60 mL/min (ref >60)
GFR calc non Af Amer: >60 mL/min (ref >60)

## 2016-04-19 MED ORDER — IOPAMIDOL (ISOVUE-300) INJECTION 61%
100.0000 mL | Freq: Once | INTRAVENOUS | Status: AC | PRN
  Administered 2016-04-19: 100 mL via INTRAVENOUS

## 2016-04-25 ENCOUNTER — Other Ambulatory Visit: Payer: Self-pay

## 2016-04-25 ENCOUNTER — Telehealth: Payer: Self-pay | Admitting: General Surgery

## 2016-04-25 ENCOUNTER — Telehealth: Payer: Self-pay | Admitting: *Deleted

## 2016-04-25 DIAGNOSIS — N3289 Other specified disorders of bladder: Secondary | ICD-10-CM

## 2016-04-25 NOTE — Telephone Encounter (Signed)
Pt advised on CT scan. Umbilical hernia notes as on exam-contains fat. Also noted focal thickening of urinary bladder wall. No other findings of concern Pt advised GU evaluation. He is agreeable. His follow up here is in Jan [id number] discuss umbilical hernia repair at that time

## 2016-04-25 NOTE — Telephone Encounter (Signed)
Patient called and wants to know results from his CT scan that was done on 04/19/16

## 2016-05-11 ENCOUNTER — Other Ambulatory Visit: Payer: Self-pay

## 2016-05-11 DIAGNOSIS — N3289 Other specified disorders of bladder: Secondary | ICD-10-CM

## 2016-05-12 ENCOUNTER — Other Ambulatory Visit (HOSPITAL_COMMUNITY)
Admission: RE | Admit: 2016-05-12 | Discharge: 2016-05-12 | Disposition: A | Discharge status: Home / Self Care | Payer: Medicare HMO | Source: Ambulatory Visit | Attending: Urology | Admitting: Urology

## 2016-05-12 ENCOUNTER — Ambulatory Visit (INDEPENDENT_AMBULATORY_CARE_PROVIDER_SITE_OTHER): Payer: Medicare Other | Admitting: Urology

## 2016-05-12 ENCOUNTER — Encounter: Payer: Self-pay | Admitting: Urology

## 2016-05-12 VITALS — BP 130/85 | HR 83 | Ht 70.0 in | Wt 230.0 lb

## 2016-05-12 DIAGNOSIS — N3289 Other specified disorders of bladder: Secondary | ICD-10-CM | POA: Insufficient documentation

## 2016-05-12 DIAGNOSIS — Z87891 Personal history of nicotine dependence: Secondary | ICD-10-CM | POA: Diagnosis not present

## 2016-05-12 DIAGNOSIS — Z87442 Personal history of urinary calculi: Secondary | ICD-10-CM | POA: Diagnosis not present

## 2016-05-12 LAB — URINALYSIS, DIPSTICK ONLY
Bilirubin Urine: NEGATIVE
Glucose, UA: >1000 mg/dL — AB
Hgb urine dipstick: NEGATIVE
Leukocytes, UA: NEGATIVE
Nitrite: NEGATIVE
Protein, ur: NEGATIVE mg/dL
Specific Gravity, Urine: 1.015 (ref 1.005–1.030)
pH: 5 (ref 5.0–8.0)

## 2016-05-12 MED ORDER — CIPROFLOXACIN HCL 500 MG PO TABS
500.0000 mg | ORAL_TABLET | Freq: Once | ORAL | Status: AC
  Administered 2016-05-12: 500 mg via ORAL

## 2016-05-12 MED ORDER — LIDOCAINE HCL 2 % EX GEL
1.0000 "application " | Freq: Once | CUTANEOUS | Status: AC
  Administered 2016-05-12: 1 via URETHRAL

## 2016-05-12 NOTE — Progress Notes (Signed)
05/12/2016 4:51 PM   Dustin Blackburn 1940/06/07 [id number]  Referring provider: Ricardo Jericho, NP [address] [place], [place] [address]  Chief Complaint  Patient presents with  . New Patient (Initial Visit)    Bladder mass    HPI: 75 year old male referred today for further evaluation of incidental bladder wall thickening seen on CT of the abdomen/ pelvis with contrast on 04/19/16/ 2017. This study was ordered by Dr. Jamal Collin for further evaluation of abdominal wall hernias.  He does have an umbilical hernia containing fat as well as an incisional hernia.  He will likely be undergoing repair for these in January.  He denies a history of gross hematuria, urgency, frequency, or any other bladder issues. No urinary tract infections.  He does have a history of nephrolithiasis, previously followed by Dr. Jacqlyn Larsen.  No stone seen on most recent scan.  He does complain of some chronic right low back pain.  Former smoker/ chewing tobacco,  30 years, quit 25 years ago.     PMH: Past Medical History:  Diagnosis Date  . Arthritis   . Asthma   . COPD (chronic obstructive pulmonary disease) ([place])   . Diabetes mellitus without complication ([place])   . GERD (gastroesophageal reflux disease)   . Heart attack 2016  . Psoriasis     Surgical History: Past Surgical History:  Procedure Laterality Date  . CORONARY STENT PLACEMENT  2016  . HIATAL HERNIA REPAIR  1992  . LIPOMA EXCISION      Home Medications:  Allergies as of 05/12/2016      Reactions   Glimepiride Rash   Other reaction(s): RASH   Metformin Shortness Of Breath   Pt states he "can't breathe and it gives him sinus congestion". Other reaction(s): OTHER   Amoxicillin    Other reaction(s): NAUSEA   Erythromycin Base Nausea Only   Other reaction(s): OTHER   Esomeprazole Magnesium Other (See Comments)   Other reaction(s): Other (See Comments) Makes patients heart burn worse Makes patients heart burn worse   Liraglutide Other (See Comments)   bloating Other reaction(s): Other (See Comments) bloating   Penicillins Nausea Only   Sitagliptin Other (See Comments)   Pt states it "gives him joint pain" & redness Other reaction(s): JOINT PAIN      Medication List       Accurate as of 05/12/16  4:51 PM. Always use your most recent med list.          albuterol (2.5 MG/3ML) 0.083% nebulizer solution Commonly known as:  PROVENTIL 2.5 mg.   aspirin EC 81 MG tablet Take 81 mg by mouth.   clopidogrel 75 MG tablet Commonly known as:  PLAVIX Take 75 mg by mouth.   DHEA Powd by Does not apply route.   glipiZIDE 5 MG tablet Commonly known as:  GLUCOTROL Take 5 mg by mouth. IS taking 2.5 mg 2 times a day   montelukast 10 MG tablet Commonly known as:  SINGULAIR Take by mouth.   predniSONE 20 MG tablet Commonly known as:  DELTASONE   Testosterone Cypionate Powd by Does not apply route.       Allergies:  Allergies  Allergen Reactions  . Glimepiride Rash    Other reaction(s): RASH  . Metformin Shortness Of Breath    Pt states he "can't breathe and it gives him sinus congestion". Other reaction(s): OTHER  . Amoxicillin     Other reaction(s): NAUSEA  . Erythromycin Base Nausea Only    Other  reaction(s): OTHER  . Esomeprazole Magnesium Other (See Comments)    Other reaction(s): Other (See Comments) Makes patients heart burn worse Makes patients heart burn worse  . Liraglutide Other (See Comments)    bloating Other reaction(s): Other (See Comments) bloating  . Penicillins Nausea Only  . Sitagliptin Other (See Comments)    Pt states it "gives him joint pain" & redness Other reaction(s): JOINT PAIN    Family History: Family History  Problem Relation Age of Onset  . Cancer Mother   . Prostate cancer Neg Hx   . Kidney cancer Neg Hx     Social History:  reports that he quit smoking about 25 years ago. His smoking use included Cigarettes. He quit after 30.00 years of  use. He has never used smokeless tobacco. He reports that he does not drink alcohol or use drugs.  ROS: UROLOGY Frequent Urination?: Yes Hard to postpone urination?: No Burning/pain with urination?: No Get up at night to urinate?: Yes Leakage of urine?: No Urine stream starts and stops?: No Trouble starting stream?: No Do you have to strain to urinate?: No Blood in urine?: No Urinary tract infection?: No Sexually transmitted disease?: No Injury to kidneys or bladder?: No Painful intercourse?: No Weak stream?: No Erection problems?: Yes Penile pain?: No  Gastrointestinal Nausea?: No Vomiting?: No Indigestion/heartburn?: Yes Diarrhea?: No Constipation?: No  Constitutional Fever: No Night sweats?: No Weight loss?: No Fatigue?: Yes  Skin Skin rash/lesions?: No Itching?: Yes  Eyes Blurred vision?: No Double vision?: No  Ears/Nose/Throat Sore throat?: No Sinus problems?: Yes  Hematologic/Lymphatic Swollen glands?: No Easy bruising?: No  Cardiovascular Leg swelling?: No Chest pain?: No  Respiratory Cough?: No Shortness of breath?: Yes  Endocrine Excessive thirst?: No  Musculoskeletal Back pain?: No Joint pain?: No  Neurological Headaches?: No Dizziness?: Yes  Psychologic Depression?: No Anxiety?: No  Physical Exam: BP 130/85   Pulse 83   Ht 5\' 10"  (1.778 m)   Wt 230 lb (104.3 kg)   BMI 33.00 kg/m   Constitutional:  Alert and oriented, No acute distress.  Appears slightly older than stated age.  Accompanied by wife today. HEENT: [place] AT, moist mucus membranes.  Trachea midline, no masses. Cardiovascular: No clubbing, cyanosis, or edema. Respiratory: Normal respiratory effort, no increased work of breathing. GI: Abdomen is soft, nontender, nondistended, no abdominal masses GU: No CVA tenderness.  Normal circumcised phallus with orthotopic meatus. Testicles descended bilaterally. Skin: No rashes, bruises or suspicious lesions. Neurologic:  Grossly intact, no focal deficits, moving all 4 extremities. Psychiatric: Normal mood and affect.  Laboratory Data:  Lab Results  Component Value Date   CREATININE 0.90 04/19/2016   PSA 0.6 on 10/11/2015  Urinalysis    Component Value Date/Time   COLORURINE YELLOW 05/12/2016 1506   APPEARANCEUR CLEAR 05/12/2016 1506   LABSPEC 1.015 05/12/2016 1506   PHURINE 5.0 05/12/2016 1506   GLUCOSEU >1000 (A) 05/12/2016 1506   HGBUR NEGATIVE 05/12/2016 1506   BILIRUBINUR NEGATIVE 05/12/2016 1506   KETONESUR TRACE (A) 05/12/2016 1506   PROTEINUR NEGATIVE 05/12/2016 1506   NITRITE NEGATIVE 05/12/2016 1506   LEUKOCYTESUR NEGATIVE 05/12/2016 1506    Pertinent Imaging: CLINICAL DATA:  Umbilical pain and bulging for several weeks  EXAM: CT ABDOMEN AND PELVIS WITH CONTRAST  TECHNIQUE: Multidetector CT imaging of the abdomen and pelvis was performed using the standard protocol following bolus administration of intravenous contrast.  CONTRAST:  126mL ISOVUE-300 IOPAMIDOL (ISOVUE-300) INJECTION 61%  COMPARISON:  CT scan 10/11/2011  FINDINGS: Lower chest:  Lung bases shows stable fibrotic changes left lower lobe laterally. Stable postsurgical changes left lateral chest wall.  Hepatobiliary: Enhanced liver shows no focal mass. Mild fatty infiltration. No calcified gallstones are noted within gallbladder.  Pancreas: Enhanced pancreas is normal. No surrounding inflammatory changes.  Spleen: Enhanced spleen is normal.  Adrenals/Urinary Tract: Multiple bilateral renal cysts are again noted. No hydronephrosis or hydroureter. No adrenal gland mass. The largest cyst in upper pole of the left kidney measures 5.3 cm. The largest cyst in midpole of the right kidney measures 4 cm. Delayed renal images shows bilateral renal symmetrical excretion. Bilateral visualized proximal ureter is unremarkable.  Stomach/Bowel: No small bowel obstruction. No gastric outlet obstruction. The  terminal ileum is unremarkable. No pericecal inflammation. The appendix is not identified. Moderate stool noted in transverse colon descending colon and proximal sigmoid colon. No colonic obstruction. No colitis or diverticulitis.  Vascular/Lymphatic: No aortic aneurysm.  No adenopathy.  Reproductive: Prostate gland and seminal vesicles are unremarkable.  Other: A skin marker is noted in left anterior abdominal wall just lateral to umbilicus. There is a left paraumbilical/supraumbilical midline ventral hernia containing fat measures 2.9 cm without evidence of acute complication. This is best seen on sagittal image 97. There is no significant change from prior exam. Sagittal image 94 there is a tiny supraumbilical ventral hernia containing fat measures 1.3 cm. No evidence of acute complication. No definite mass is noted at the level of skin marker.  There is a right inguinal scrotal canal hernia containing fat without evidence of acute complication measures about 2.7 cm.  Axial image 68 there is focal thickening of left urinary bladder wall measures about 1 cm. This is confirmed on sagittal image 103. Highly suspicious for a bladder mass. Further correlation with urology exam and cystoscopy is recommended.  Musculoskeletal: No destructive bony lesions are noted. Sagittal images of the spine shows degenerative changes thoracolumbar spine. Degenerative changes bilateral SI joints. Degenerative changes pubic symphysis. Degenerative changes bilateral hip joints.  IMPRESSION: 1. There is no evidence of acute inflammatory process within abdomen. 2. There is a left paraumbilical/supraumbilical midline ventral hernia containing fat measures 2.9 cm without evidence of acute complication. This is best seen on sagittal image 97. There is no significant change from prior exam. Sagittal image 94 there is a tiny supraumbilical ventral hernia containing fat measures 1.3 cm. No evidence  of acute complication. No definite mass is noted at the level of skin marker. 3. Fatty infiltration of the liver. 4. Axial image 68 there is focal thickening of left urinary bladder wall measures about 1 cm. This is confirmed on sagittal image 103. Highly suspicious for a bladder mass. Further correlation with urology exam and cystoscopy is recommended. 5. Degenerative changes as described above.   Electronically Signed   By: Lahoma Crocker M.D.   On: 04/19/2016 11:17  CT scan personally reviewed today and with the patient  Cysto performed today as part of work up, see attached note- bladder mass identified today on cystoscopy, approximately 1 cm  Assessment & Plan:   1. 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 I have recommended proceeding to the operating room for TURBT, bilateral retrograde pyelogram, and instillation of mitomycin intravesically. Risks of the procedure were discussed today in detail including the risk of bleeding, infection, damage to bladder, bladder perforation, Foley catheterization, bladder irritation, cystitis amongst others. Received general anesthesia were also  discussed.  He will need cardiac clearance to clear him to hold his aspirin prior to the procedure. Given that he will be likely undergoing surgery in January for his hernias, I will try to arrange for concomitant surgery. All his questions were answered today. - ciprofloxacin (CIPRO) tablet 500 mg; Take 1 tablet (500 mg total) by mouth once. - lidocaine (XYLOCAINE) 2 % jelly 1 application; Place 1 application into the urethra once.  2. History of tobacco abuse Relationship between tobacco abuse and bladder cancer were discussed.  3. History of kidney stones No stones identified on CT scan with contrast Right lower back pain unlikely to be related to nephrolithiasis    Schedule  surgery  Cc: Dr. Jamal Collin  Return for Schedule surgery.  Hollice Espy, MD  [facility] [address], [place], [place] [address] [phone]

## 2016-05-12 NOTE — Progress Notes (Signed)
   05/12/16  CC:  Chief Complaint  Patient presents with  . New Patient (Initial Visit)    Bladder mass    HPI: See H&P from 05/12/16  Blood pressure 130/85, pulse 83, height 5\' 10"  (1.778 m), weight 230 lb (104.3 kg). NED. A&Ox3.   No respiratory distress   Abd soft, NT, ND Normal phallus with bilateral descended testicles  Cystoscopy Procedure Note  Patient identification was confirmed, informed consent was obtained, and patient was prepped using Betadine solution.  Lidocaine jelly was administered per urethral meatus.    Preoperative abx where received prior to procedure.     Pre-Procedure: - Inspection reveals a normal caliber ureteral meatus.  Procedure: The flexible cystoscope was introduced without difficulty - No urethral strictures/lesions are present. - Enlarged prostate Bilobar coaptation - Mildly elevated bladder neck - Bilateral ureteral orifices identified - Bladder mucosa  reveals a 1 cm papillary tumor on a broad base on the left dome of bladder, correlates with CT scan findings. No other lesions within the bladder appreciated. - No bladder stones - Minimal trabeculation  Retroflexion shows no significant median lobe   Post-Procedure: - Patient tolerated the procedure well  Assessment/ Plan:  See H&P  Hollice Espy, MD

## 2016-05-17 ENCOUNTER — Other Ambulatory Visit: Payer: Self-pay | Admitting: Radiology

## 2016-05-17 ENCOUNTER — Telehealth: Payer: Self-pay | Admitting: Radiology

## 2016-05-17 DIAGNOSIS — N3289 Other specified disorders of bladder: Secondary | ICD-10-CM

## 2016-05-17 NOTE — Telephone Encounter (Signed)
-----   Message from Hollice Espy, MD sent at 05/12/2016  4:58 PM EST ----- Regarding: surgery Please book this patient for TURBT (small), bilateral retrogrades, mitomycin  cipro 500 mg, mitomycin 40 mg   Needs preop urine culture  Cardiac clearance needed to hold ASA/ plavix  He'll be undergoing hernia repair with Dr. Jamal Collin, it would be cool to do a combined procedure.   Can you see if that's possible?    Follow up 1 week after procedure for pathology.     Hollice Espy, MD

## 2016-05-17 NOTE — Telephone Encounter (Signed)
Notified pt & wife, Inez Catalina, of surgery scheduled with Dr Erlene Quan & Dr Jamal Collin on 06/12/16, surgical clearance appt with Dr Saralyn Pilar at the [facility] office on 05/19/16 @11 :00, pre-admit testing appt on 06/02/16 @10 :30 and to call Friday prior to surgery for arrival time to SDS. Also notified pt of post-op appt with Dr Erlene Quan on 06/16/16 at 4:15 at the [facility] office. Pt & wife voice understanding.

## 2016-05-18 ENCOUNTER — Encounter: Payer: Self-pay | Admitting: *Deleted

## 2016-05-18 NOTE — Progress Notes (Signed)
Patient has been scheduled for an umbilical hernia repair on 06-12-16 at [facility].   Dr. Hollice Espy will be doing a TURBT the same day after hernia repair. Patient has been made aware of his pre-admission appointment by Amy at Dr. Cherrie Gauze office.   Patient has received pulmonary clearance from Dr. Raul Del.   Dr. Cherrie Gauze office has arranged for patient to have cardiac clearance with Dr. Saralyn Pilar at [facility] for tomorrow, 05-19-16 at 11 am.  We will see patient as previously scheduled on 06-08-16 for pre-op. Patient verbalizes understanding.   He will call the office if he has further questions.

## 2016-05-26 ENCOUNTER — Other Ambulatory Visit: Payer: Self-pay | Admitting: General Surgery

## 2016-05-26 DIAGNOSIS — K429 Umbilical hernia without obstruction or gangrene: Secondary | ICD-10-CM

## 2016-05-29 DIAGNOSIS — C801 Malignant (primary) neoplasm, unspecified: Secondary | ICD-10-CM

## 2016-05-29 HISTORY — DX: Malignant (primary) neoplasm, unspecified: C80.1

## 2016-06-02 ENCOUNTER — Encounter
Admission: RE | Admit: 2016-06-02 | Discharge: 2016-06-02 | Disposition: A | Discharge status: Home / Self Care | Payer: Medicare HMO | Source: Ambulatory Visit | Attending: General Surgery | Admitting: General Surgery

## 2016-06-02 DIAGNOSIS — J449 Chronic obstructive pulmonary disease, unspecified: Secondary | ICD-10-CM | POA: Insufficient documentation

## 2016-06-02 DIAGNOSIS — M5030 Other cervical disc degeneration, unspecified cervical region: Secondary | ICD-10-CM | POA: Insufficient documentation

## 2016-06-02 DIAGNOSIS — J329 Chronic sinusitis, unspecified: Secondary | ICD-10-CM | POA: Insufficient documentation

## 2016-06-02 DIAGNOSIS — E669 Obesity, unspecified: Secondary | ICD-10-CM | POA: Insufficient documentation

## 2016-06-02 DIAGNOSIS — I252 Old myocardial infarction: Secondary | ICD-10-CM | POA: Diagnosis not present

## 2016-06-02 DIAGNOSIS — I2510 Atherosclerotic heart disease of native coronary artery without angina pectoris: Secondary | ICD-10-CM | POA: Diagnosis not present

## 2016-06-02 DIAGNOSIS — E785 Hyperlipidemia, unspecified: Secondary | ICD-10-CM | POA: Insufficient documentation

## 2016-06-02 DIAGNOSIS — K219 Gastro-esophageal reflux disease without esophagitis: Secondary | ICD-10-CM | POA: Diagnosis not present

## 2016-06-02 DIAGNOSIS — J301 Allergic rhinitis due to pollen: Secondary | ICD-10-CM | POA: Diagnosis not present

## 2016-06-02 DIAGNOSIS — E119 Type 2 diabetes mellitus without complications: Secondary | ICD-10-CM | POA: Insufficient documentation

## 2016-06-02 DIAGNOSIS — J45909 Unspecified asthma, uncomplicated: Secondary | ICD-10-CM | POA: Diagnosis not present

## 2016-06-02 DIAGNOSIS — Z01812 Encounter for preprocedural laboratory examination: Secondary | ICD-10-CM | POA: Insufficient documentation

## 2016-06-02 HISTORY — DX: Cyst of kidney, acquired: N28.1

## 2016-06-02 HISTORY — DX: Calculus of kidney: N20.0

## 2016-06-02 HISTORY — DX: Dyspnea, unspecified: R06.00

## 2016-06-02 HISTORY — DX: Reserved for inherently not codable concepts without codable children: IMO0001

## 2016-06-02 LAB — CBC
HCT: 46.5 % (ref 40.0–52.0)
Hemoglobin: 15.8 g/dL (ref 13.0–18.0)
MCH: 31.1 pg (ref 26.0–34.0)
MCHC: 34 g/dL (ref 32.0–36.0)
MCV: 91.4 fL (ref 80.0–100.0)
Platelets: 260 10*3/uL (ref 150–440)
RBC: 5.09 MIL/uL (ref 4.40–5.90)
RDW: 12.8 % (ref 11.5–14.5)
WBC: 10.5 10*3/uL (ref 3.8–10.6)

## 2016-06-02 LAB — BASIC METABOLIC PANEL
Anion gap: 5 (ref 5–15)
BUN: 26 mg/dL — ABNORMAL HIGH (ref 6–20)
CO2: 29 mmol/L (ref 22–32)
Calcium: 9.5 mg/dL (ref 8.9–10.3)
Chloride: 102 mmol/L (ref 101–111)
Creatinine, Ser: 0.8 mg/dL (ref 0.61–1.24)
GFR calc Af Amer: >60 mL/min (ref >60)
GFR calc non Af Amer: >60 mL/min (ref >60)
Glucose, Bld: 195 mg/dL — ABNORMAL HIGH (ref 65–99)
Potassium: 4.5 mmol/L (ref 3.5–5.1)
Sodium: 136 mmol/L (ref 135–145)

## 2016-06-02 NOTE — Patient Instructions (Addendum)
  Your procedure is scheduled on: 06/12/16 Mon Report to Same Day Surgery 2nd floor medical mall [facility] Entrance-take elevator on left to 2nd floor.  Check in with surgery information desk.) To find out your arrival time please call [phone] between PM - PM on 06/09/16 Fri  Remember: Instructions that are not followed completely may result in serious medical risk, up to and including death, or upon the discretion of your surgeon and anesthesiologist your surgery may need to be rescheduled.    _x___ 1. Do not eat food or drink liquids after midnight. No gum chewing or hard candies.     __x__ 2. No Alcohol for 24 hours before or after surgery.   __x__3. No Smoking for 24 prior to surgery.   ____  4. Bring all medications with you on the day of surgery if instructed.    __x__ 5. Notify your doctor if there is any change in your medical condition     (cold, fever, infections).     Do not wear jewelry, make-up, hairpins, clips or nail polish.  Do not wear lotions, powders, or perfumes. You may wear deodorant.  Do not shave 48 hours prior to surgery. Men may shave face and neck.  Do not bring valuables to the hospital.    [facility] is not responsible for any belongings or valuables.               Contacts, dentures or bridgework may not be worn into surgery.  Leave your suitcase in the car. After surgery it may be brought to your room.  For patients admitted to the hospital, discharge time is determined by your treatment team.   Patients discharged the day of surgery will not be allowed to drive home.  You will need someone to drive you home and stay with you the night of your procedure.    Please read over the following fact sheets that you were given:   [facility] Preparing for Surgery and or MRSA Information   _x___ Take these medicines the morning of surgery with A SIP OF WATER:    1. None  2.  3.  4.  5.  6.  ____Fleets enema or Magnesium Citrate as  directed.   _x___ Use CHG Soap or sage wipes as directed on instruction sheet   ____ Use inhalers on the day of surgery and bring to hospital day of surgery  ____ Stop metformin 2 days prior to surgery    ____ Take 1/2 of usual insulin dose the night before surgery and none on the morning of           surgery.   _x___ Stop Aspirin, Coumadin, Plavix ,Eliquis, Effient, or Pradaxa  To ask Dr Saralyn Pilar regarding   x__ Stop Anti-inflammatories such as Advil, Aleve, Ibuprofen, Motrin, Naproxen,          Naprosyn, Goodies powders or aspirin products. Ok to take Tylenol.   ____ Stop supplements until after surgery.    ____ Bring C-Pap to the hospital.

## 2016-06-02 NOTE — Pre-Procedure Instructions (Signed)
Isaias Cowman, MD - 05/19/2016 11:00 AM EST Formatting of this note may be different from the original. New Patient Visit   Chief Complaint: Chief Complaint  Patient presents with  . Establish Care  at [facility] 05-08-16-sob  . Pre Op Consulting  [facility]  . Chest Pain  has anginia evertime i eat  . Shortness of Breath  with bloating  . Dizziness  Date of Service: 05/19/2016 Date of Birth: 1940-12-16 PCP: Ricardo Jericho, NP  History of Present Illness: Mr. Zientek is a 76 y.o.male patient who is referred for preoperative cardiovascular evaluation, known coronary artery disease, and hyperlipidemia. Patient is status post inferior STEMI with resolute stent mid RCA 12/31/2014. She underwent ETT sestamibi 03/30/15 which revealed large inferior scar mixed with ischemia and underwent cardiac catheterization 04/01/2015 which revealed patent stent mid RCA with residual 30% proximal LAD and 40% proximal left circumflex. The patient reports occasional episodes of chest pain, mostly nonexertional, probably GI origin with bulging and bloating. He has chronic exertional dyspnea. He denies palpitations or heart racing. He denies peripheral edema. The patient is active but does not do any structured exercise. He is awaiting surgery for 1 cm bladder tumor with Dr. Hollice Espy, as well as, inguinal hernia repair with Dr. Jamal Collin.  The patient's hyperlipidemia, on atorvastatin, which is well tolerated without apparent side effects. The patient follows a low-cholesterol, low-fat diet.  Past Medical and Surgical History  Past Medical History Past Medical History:  Diagnosis Date  . Asthma without status asthmaticus, unspecified  . COPD (chronic obstructive pulmonary disease) , unspecified (CMS-HCC)  . Diabetes mellitus type 2, uncomplicated (CMS-HCC)  . GERD (gastroesophageal reflux disease)  . H/O aspiration pneumonitis  . Hearing loss of both ears  . Hyperlipidemia, unspecified  . Kidney  stones  . OA (osteoarthritis)  . Seasonal allergies   Past Surgical History He has a past surgical history that includes frontal sinusotomy; Unlisted Procedure Esophagus; Tonsillectomy; and lipoma.   Medications and Allergies  Current Medications Current Outpatient Prescriptions  Medication Sig Dispense Refill  . albuterol (PROVENTIL) 2.5 mg /3 mL (0.083 %) nebulizer solution Take 2.5 mg by nebulization every 6 (six) hours as needed for Wheezing.  Marland Kitchen albuterol 90 mcg/actuation inhaler Inhale 2 inhalations into the lungs every 6 (six) hours as needed for Wheezing. 1 Inhaler 12  . aspirin 81 MG EC tablet Take by mouth.  Marland Kitchen atorvastatin (LIPITOR) 80 MG tablet Take 1 tablet (80 mg total) by mouth once daily. 30 tablet 11  . azelastine (ASTELIN) 137 mcg nasal spray Reported on 07/19/2015  . budesonide-formoterol (SYMBICORT) 160-4.5 mcg/actuation inhaler Inhale 2 inhalations into the lungs 2 (two) times daily. 1 Inhaler 11  . fluocinonide (LIDEX) 0.05 % external solution Apply topically 2 (two) times daily as needed. 60 mL 5  . fluticasone (FLONASE) 50 mcg/actuation nasal spray Reported on 07/19/2015  . glipiZIDE (GLUCOTROL XL) 5 MG XL tablet Take 1 tablet (5 mg total) by mouth once daily. 90 tablet 3  . Herbal Supplement Herbal Name: LIVER AID 400MG   . Herbal Supplement Herbal Name: PEANUT  . Herbal Supplement Herbal Name: HART GARD DROPS  . Herbal Supplement Herbal Name: TESTOSTERONE 7 DROPS  . Herbal Supplement Herbal Name: RAW NATTOKWINASE 200MG   . Herbal Supplement Herbal Name: GLUTATHIONE BOOSTER  . IODINE ORAL Take 1 tablet by mouth once daily. Brand name: Iodoral  . montelukast (SINGULAIR) 10 mg tablet Take 1 tablet (10 mg total) by mouth nightly. 30 tablet prn  . triamcinolone  0.025 % cream Apply topically 2 (two) times daily. Apply as directed to ear canals. Use daily for two weeks, then twice weekly thereafter. 30 g 4  . TURMERIC ROOT EXTRACT ORAL Take by mouth.  . clopidogrel (PLAVIX)  75 mg tablet Take 75 mg by mouth.   No current facility-administered medications for this visit.   Allergies Glimepiride; Metformin; Amoxicillin; Erythromycin base; Esomeprazole magnesium; Nexium [esomeprazole magnesium]; Penicillins; Sitagliptin; and Victoza [liraglutide]  Social and Family History  Social History reports that he quit smoking about 22 years ago. His smoking use included Cigarettes. He has a 30.00 pack-year smoking history. He quit smokeless tobacco use about 25 years ago. He reports that he does not drink alcohol or use drugs.  Family History family history includes Alcohol abuse in his brother; Cancer in his maternal grandfather, maternal grandmother, paternal grandfather, and paternal grandmother; Emphysema in his father; Stroke in his sister; Throat cancer in his mother; Thyroid disease in his mother.   Review of Systems   Review of Systems: The patient reports intermittent episodes of chest pain, with exertional shortness of breath, without orthopnea, paroxysmal nocturnal dyspnea, pedal edema, palpitations, heart racing, presyncope, syncope. Review of 12 Systems is negative except as described above.  Physical Examination   Vitals:BP 122/70  Pulse 72  Ht 177.8 cm (5\' 10" )  Wt (!) 103.1 kg (227 lb 3.2 oz)  BMI 32.60 kg/m  Ht:177.8 cm (5\' 10" ) Wt:(!) 103.1 kg (227 lb 3.2 oz) [id number] surface area is 2.26 meters squared. Body mass index is 32.6 kg/m.  HEENT: Pupils equally reactive to light and accomodation  Neck: Supple without thyromegaly, carotid pulses 2+ Lungs: clear to auscultation bilaterally; no wheezes, rales, rhonchi Heart: Regular rate and rhythm. No gallops, murmurs or rub Abdomen: soft nontender, nondistended, with normal bowel sounds Extremities: no cyanosis, clubbing, or edema Peripheral Pulses: 2+ in all extremities, 2+ femoral pulses bilaterally  Assessment   76 y.o. male with  1. Coronary artery disease involving native coronary artery of  native heart with angina pectoris (CMS-HCC)  2. Simple chronic bronchitis (CMS-HCC)  3. Type 2 diabetes mellitus without complication, without long-term current use of insulin (CMS-HCC)  4. Pure hypercholesterolemia  5. SOB (shortness of breath) on exertion  6. Chest pain with high risk for cardiac etiology, unspecified   76 year old gentleman with recent history of inferior STEMI, stent mid RCA, with intermittent chest pain, with typical and atypical features. Patient has hyperlipidemia, on atorvastatin, which is well tolerated. She referred for preoperative cardiovascular evaluation prior to surgery for bladder tumor and inguinal hernia.  Plan   1. Continue current medications 2. Counseled patient about low-sodium diet 3. DASH diet printed instructions given to the patient 4. Constipation about low-cholesterol diet 5. Continue atorvastatin for hyperlipidemia management 6. Low-fat and cholesterol diet printed instructions given to the patient 7. 2D echocardiogram 8. ETT Myoview study 9. Return to clinic after 2D echocardiogram and ETT Myoview  Orders Placed This Encounter  Procedures  . NM myocardial perfusion SPECT multiple (stress and rest)  . ECG stress test only  . Echo complete   Return in about 1 week (around 05/26/2016), or after echo, ett-Myo.  Isaias Cowman, MD

## 2016-06-02 NOTE — Pre-Procedure Instructions (Signed)
LV Ejection Fraction (%) 55   Aortic Valve Stenosis Grade none   Aortic Valve Regurgitation Grade none   Aortic Valve Max Velocity (m/s) 1.4 m/sec   Mitral Valve Stenosis Grade none   Mitral Valve Regurgitation Grade mild   Tricuspid Valve Regurgitation Grade mild   Tricuspid Valve Regurgitation Max Velocity (m/s) 2.3 m/sec   Right Ventricle Systolic Pressure (mmHg) 0000000 mmHg   LV End Diastolic Diameter (cm) 4.9 cm  LV End Systolic Diameter (cm) 3.5 cm  LV Septum Wall Thickness (cm) 1.3 cm  LV Posterior Wall Thickness (cm) 0.98 cm  Left Atrium Diameter (cm) 4.0 cm  Result Narrative  [facility] [phone]  [place], [place], [place] [id number] Acct #: [id number] Date: 06/01/2016 03:02 PM  ECHOCARDIOGRAM REPORTAdult Male Age: 76 yrs Outpatient  STUDY:CHEST WALL TAPE: KC::KCMC ECHO:Yes DOPPLER:YesFILE: MD1:PARASCHOS, ALEXANDER  COLOR:YesCONTRAST:NoMACHINE:AccusonBP: 122/70 mmHg  RV BIOPSY:No 3D:No SOUND QLTY:Moderate Height: 70 in MEDIUM:None Weight: 229 lb BSA: 2.2 m2  ___________________________________________________________________________________________  HISTORY:DOE REASON:Assess, LV function INDICATION:Coronary artery disease involving native coronary artery of nati ___________________________________________________________________________________________  ECHOCARDIOGRAPHIC MEASUREMENTS 2D DIMENSIONS AORTA ValuesNormal RangeMAIN  PAValuesNormal Range Annulus:1.8 cm[2.3 - 2.9]PA Main:nm* [1.5 - 2.1] Aorta Sin:2.7 cm[3.1 - 3.7] RIGHT VENTRICLE ST Junction:nm* [2.6 - 3.2]RV Base:nm* [ < 4.2] Asc.Aorta:nm* [2.6 - 3.4] RV Mid:nm* [ < 3.5]  LEFT VENTRICLERV Length:3.2 cm[ < 8.6] LVIDd:4.9 cm[4.2 - 5.9] INFERIOR VENA CAVA LVIDs:3.5 cmMax. IVC:nm* [ <= 2.1]  FS:28.6 %[> 25]Min. IVC:nm* SWT:1.3 cm[0.6 - 1.0] ------------------ PWT:0.98 cm [0.6 - 1.0] nm* - not measured  LEFT ATRIUM LA Diam:4.0 cm[3.0 - 4.0] LA A4C Area:nm* [ < 20] LA Volume:nm* Roice.Felt - 58] ___________________________________________________________________________________________  ECHOCARDIOGRAPHIC DESCRIPTIONS  AORTIC ROOT Size:Normal Dissection:INDETERM FOR DISSECTION  AORTIC VALVE Leaflets:Tricuspid Morphology:Normal Mobility:Fully mobile  LEFT VENTRICLE Size:NormalAnterior:Normal  Contraction:Normal Lateral:Normal Closest EF:>55% (Estimated)Septal:Normal  LV Masses:No Masses Apical:Normal  [id number] LVHInferior:Normal Posterior:Normal Dias.FxClass:N/A  MITRAL VALVE Leaflets:NormalMobility:Fully mobile Morphology:Normal  LEFT ATRIUM Size:Normal LA Masses:No masses  IA Septum:Normal IAS  MAIN PA Size:Normal  PULMONIC VALVE Morphology:NormalMobility:Fully mobile  RIGHT VENTRICLE  RV Masses:No  Masses Size:Normal  Free Wall:Normal Contraction:Normal  TRICUSPID VALVE Leaflets:NormalMobility:Fully mobile Morphology:Normal  RIGHT ATRIUM Size:NormalRA Other:None  RA Mass:No masses  PERICARDIUM  Fluid:No effusion  INFERIOR VENACAVA Size:Not seen Not Seen  ____________________________________________________________________  DOPPLER ECHO and OTHER SPECIAL PROCEDURES  Aortic:No AR No AS 142.8 cm/sec peak vel 8.2 mmHg peak grad   Mitral:MILD MR No MS MV Inflow E Vel=50.8 cm/sec MV Annulus E'Vel=8.7 cm/sec E/E'Ratio=5.8  Tricuspid:MILD TR No TS 226.7 cm/sec peak TR vel25.6 mmHg peak RV pressure  Pulmonary:MILD PR No PS 86.8 cm/sec peak vel3.0 mmHg peak grad    ___________________________________________________________________________________________  INTERPRETATION NORMAL LEFT VENTRICULAR SYSTOLIC FUNCTION WITH MILD LVH NORMAL RIGHT VENTRICULAR SYSTOLIC FUNCTION MILD VALVULAR REGURGITATION (See above) NO VALVULAR STENOSIS EF 55%  ___________________________________________________________________________________________  Electronically signed by: MD Miquel Dunn on 06/01/2016 05:00 PM Performed By: Maurilio Lovely, RDCS Ordering Physician: Isaias Cowman ___________________________________________________________________________________

## 2016-06-02 NOTE — Pre-Procedure Instructions (Signed)
EKG Ventricular Rate 79 BPM   EKG Atrial Rate 79 BPM   EKG P-R Interval 176 ms  EKG QRS Duration 96 ms  EKG Q-T Interval 364 ms  EKG QTC Calculation 417 ms  EKG Calculated P Axis 64 degrees   EKG Calculated R Axis -20 degrees   EKG Calculated T Axis 19 degrees   Result Narrative  NORMAL SINUS RHYTHM INFERIOR INFARCT (CITED ON OR BEFORE 30-Dec-2014) ABNORMAL ECG WHEN COMPARED WITH ECG OF 29-Mar-2015 14:51, NO SIGNIFICANT CHANGE SINCE LAST TRACING  Confirmed by ROSE-JONES, LISA ([id number]) on 05/08/2016 1:20:05 PM  Status

## 2016-06-03 LAB — URINE CULTURE: Culture: NO GROWTH

## 2016-06-07 NOTE — Pre-Procedure Instructions (Signed)
CLEARED LOW RISK BY DR PARASCHOS  06/07/16

## 2016-06-07 NOTE — Telephone Encounter (Signed)
Advised pt to hold ASA 81mg  & Plavix beginning immediately in preparation for surgery scheduled on 06/12/16 with Dr Jamal Collin & Dr Erlene Quan. Pt states he does not take Plavix anyway but will hold the ASA as advised.

## 2016-06-08 ENCOUNTER — Encounter: Payer: Self-pay | Admitting: General Surgery

## 2016-06-08 ENCOUNTER — Ambulatory Visit (INDEPENDENT_AMBULATORY_CARE_PROVIDER_SITE_OTHER): Payer: Medicare HMO | Admitting: General Surgery

## 2016-06-08 VITALS — BP 124/70 | HR 88 | Resp 14 | Ht 70.0 in | Wt 230.0 lb

## 2016-06-08 DIAGNOSIS — K429 Umbilical hernia without obstruction or gangrene: Secondary | ICD-10-CM

## 2016-06-08 NOTE — Progress Notes (Addendum)
Patient ID: Dustin Blackburn, male   DOB: [birth year], 76 y.o.   MRN: [id number]  Chief Complaint  Patient presents with  . Pre-op Exam    HPI Dustin Blackburn is a 76 y.o. male.  Here for preop umbilical hernia and bladder surgery with Dr Erlene Quan, scheduled for 06-12-16. No new symptoms. I have reviewed the history of present illness with the patient.   HPI  Past Medical History:  Diagnosis Date  . Arthritis   . Asthma   . Cold   . COPD (chronic obstructive pulmonary disease) ([place])   . Diabetes mellitus without complication ([place])   . Dyspnea   . GERD (gastroesophageal reflux disease)   . Heart attack 2016  . Kidney cysts   . Kidney stones   . Psoriasis     Past Surgical History:  Procedure Laterality Date  . CORONARY STENT PLACEMENT  2016  . fatty tumors removed    . HIATAL HERNIA REPAIR  1992  . LIPOMA EXCISION    . SINUS EXPLORATION      Family History  Problem Relation Age of Onset  . Cancer Mother   . Prostate cancer Neg Hx   . Kidney cancer Neg Hx     Social History Social History  Substance Use Topics  . Smoking status: Former Smoker    Years: 30.00    Types: Cigarettes    Quit date: 05/29/1990  . Smokeless tobacco: Never Used  . Alcohol use No    Allergies  Allergen Reactions  . Glimepiride Rash    Other reaction(s): RASH  . Metformin Shortness Of Breath    Pt states he "can't breathe and it gives him sinus congestion". Other reaction(s): OTHER  . Amoxicillin     Other reaction(s): NAUSEA  . Erythromycin Base Nausea Only    Other reaction(s): OTHER  . Esomeprazole Magnesium Other (See Comments)    Other reaction(s): Other (See Comments) Makes patients heart burn worse Makes patients heart burn worse  . Liraglutide Other (See Comments)    bloating Other reaction(s): Other (See Comments) bloating  . Penicillins Nausea Only  . Sitagliptin Other (See Comments)    Pt states it "gives him joint pain" & redness Other reaction(s): JOINT PAIN     Current Outpatient Prescriptions  Medication Sig Dispense Refill  . albuterol (PROVENTIL) (2.5 MG/3ML) 0.083% nebulizer solution Take 2.5 mg by nebulization every 4 (four) hours as needed.     Marland Kitchen glipiZIDE (GLUCOTROL) 5 MG tablet Take 5 mg by mouth.     . montelukast (SINGULAIR) 10 MG tablet Take 10 mg by mouth at bedtime.     . Prasterone (DHEA) POWD by Does not apply route.    . Testosterone Cypionate POWD 7 Doses by Does not apply route at bedtime. 7 drops    . aspirin EC 81 MG tablet Take 81 mg by mouth.     No current facility-administered medications for this visit.     Review of Systems Review of Systems  Constitutional: Negative.   Respiratory: Negative.   Cardiovascular: Negative.     Blood pressure 124/70, pulse 88, resp. rate 14, height 5\' 10"  (1.778 m), weight 230 lb (104.3 kg).  Physical Exam Physical Exam  Constitutional: He is oriented to person, place, and time. He appears well-developed and well-nourished.  HENT:  Mouth/Throat: Oropharynx is clear and moist.  Eyes: Conjunctivae are normal. No scleral icterus.  Neck: Neck supple.  Cardiovascular: Normal rate, regular rhythm and normal heart sounds.  Pulmonary/Chest: Effort normal and breath sounds normal.  Abdominal: Soft. Normal appearance and bowel sounds are normal. There is no tenderness. A hernia is present.  Partially reducible 2 cm umbilical hernia. Diastasis recti present.  Lymphadenopathy:    He has no cervical adenopathy.  Neurological: He is alert and oriented to person, place, and time.  Skin: Skin is warm and dry.  Psychiatric: His behavior is normal.    Data Reviewed Prior notes  Assessment    Umbilical hernia. Diastasis recti. 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 If they develop symptoms of an  incarcerated hernia, they were encouraged to seek prompt medical attention.  I have recommended repair of the hernia using mesh on an outpatient basis in the near future. The risk of infection was reviewed. The role of prosthetic mesh to minimize the risk of recurrence was reviewed.     This information has been scribed by Karie Fetch RN, BSN,BC.   Dustin Blackburn 06/08/2016, 12:11 PM

## 2016-06-08 NOTE — Patient Instructions (Addendum)
The patient is aware to call back for any questions or concerns.  Hernia, Adult A hernia is the bulging of an organ or tissue through a weak spot in the muscles of the abdomen (abdominal wall). Hernias develop most often near the navel or groin. There are many kinds of hernias. Common kinds include:  Femoral hernia. This kind of hernia develops under the groin in the upper thigh area.  Inguinal hernia. This kind of hernia develops in the groin or scrotum.  Umbilical hernia. This kind of hernia develops near the navel.  Hiatal hernia. This kind of hernia causes part of the stomach to be pushed up into the chest.  Incisional hernia. This kind of hernia bulges through a scar from an abdominal surgery.  What are the causes? This condition may be caused by:  Heavy lifting.  Coughing over a long period of time.  Straining to have a bowel movement.  An incision made during an abdominal surgery.  A birth defect (congenital defect).  Excess weight or obesity.  Smoking.  Poor nutrition.  Cystic fibrosis.  Excess fluid in the abdomen.  Undescended testicles.  What are the signs or symptoms? Symptoms of a hernia include:  A lump on the abdomen. This is the first sign of a hernia. The lump may become more obvious with standing, straining, or coughing. It may get bigger over time if it is not treated or if the condition causing it is not treated.  Pain. A hernia is usually painless, but it may become painful over time if treatment is delayed. The pain is usually dull and may get worse with standing or lifting heavy objects.  Sometimes a hernia gets tightly squeezed in the weak spot (strangulated) or stuck there (incarcerated) and causes additional symptoms. These symptoms may include:  Vomiting.  Nausea.  Constipation.  Irritability.  How is this diagnosed? A hernia may be diagnosed with:  A physical exam. During the exam your health care provider may ask you to cough  or to make a specific movement, because a hernia is usually more visible when you move.  Imaging tests. These can include: ? X-rays. ? Ultrasound. ? CT scan.  How is this treated? A hernia that is small and painless may not need to be treated. A hernia that is large or painful may be treated with surgery. Inguinal hernias may be treated with surgery to prevent incarceration or strangulation. Strangulated hernias are always treated with surgery, because lack of blood to the trapped organ or tissue can cause it to die. Surgery to treat a hernia involves pushing the bulge back into place and repairing the weak part of the abdomen. Follow these instructions at home:  Avoid straining.  Do not lift anything heavier than 10 lb (4.5 kg).  Lift with your leg muscles, not your back muscles. This helps avoid strain.  When coughing, try to cough gently.  Prevent constipation. Constipation leads to straining with bowel movements, which can make a hernia worse or cause a hernia repair to break down. You can prevent constipation by: ? Eating a high-fiber diet that includes plenty of fruits and vegetables. ? Drinking enough fluids to keep your urine clear or pale yellow. Aim to drink 6-8 glasses of water per day. ? Using a stool softener as directed by your health care provider.  Lose weight, if you are overweight.  Do not use any tobacco products, including cigarettes, chewing tobacco, or electronic cigarettes. If you need help quitting, ask your health   care provider.  Keep all follow-up visits as directed by your health care provider. This is important. Your health care provider may need to monitor your condition. Contact a health care provider if:  You have swelling, redness, and pain in the affected area.  Your bowel habits change. Get help right away if:  You have a fever.  You have abdominal pain that is getting worse.  You feel nauseous or you vomit.  You cannot push the hernia back  in place by gently pressing on it while you are lying down.  The hernia: ? Changes in shape or size. ? Is stuck outside the abdomen. ? Becomes discolored. ? Feels hard or tender. This information is not intended to replace advice given to you by your health care provider. Make sure you discuss any questions you have with your health care provider. Document Released: 05/15/2005 Document Revised: 10/13/2015 Document Reviewed: 03/25/2014 Elsevier Interactive Patient Education  2017 [employer]  

## 2016-06-12 ENCOUNTER — Encounter
Admission: RE | Disposition: A | Discharge status: Home / Self Care | Payer: Self-pay | Source: Ambulatory Visit | Attending: General Surgery

## 2016-06-12 ENCOUNTER — Ambulatory Visit
Admission: RE | Admit: 2016-06-12 | Discharge: 2016-06-12 | Disposition: A | Discharge status: Home / Self Care | Payer: Medicare HMO | Source: Ambulatory Visit | Attending: General Surgery | Admitting: General Surgery

## 2016-06-12 ENCOUNTER — Encounter: Payer: Self-pay | Admitting: *Deleted

## 2016-06-12 ENCOUNTER — Ambulatory Visit: Payer: Medicare HMO

## 2016-06-12 ENCOUNTER — Ambulatory Visit: Payer: Medicare HMO | Admitting: Anesthesiology

## 2016-06-12 DIAGNOSIS — M1990 Unspecified osteoarthritis, unspecified site: Secondary | ICD-10-CM | POA: Diagnosis not present

## 2016-06-12 DIAGNOSIS — J449 Chronic obstructive pulmonary disease, unspecified: Secondary | ICD-10-CM | POA: Diagnosis not present

## 2016-06-12 DIAGNOSIS — Z888 Allergy status to other drugs, medicaments and biological substances status: Secondary | ICD-10-CM | POA: Diagnosis not present

## 2016-06-12 DIAGNOSIS — Z881 Allergy status to other antibiotic agents status: Secondary | ICD-10-CM | POA: Diagnosis not present

## 2016-06-12 DIAGNOSIS — E119 Type 2 diabetes mellitus without complications: Secondary | ICD-10-CM | POA: Diagnosis not present

## 2016-06-12 DIAGNOSIS — Z87442 Personal history of urinary calculi: Secondary | ICD-10-CM | POA: Insufficient documentation

## 2016-06-12 DIAGNOSIS — K219 Gastro-esophageal reflux disease without esophagitis: Secondary | ICD-10-CM | POA: Insufficient documentation

## 2016-06-12 DIAGNOSIS — C671 Malignant neoplasm of dome of bladder: Secondary | ICD-10-CM | POA: Diagnosis not present

## 2016-06-12 DIAGNOSIS — E669 Obesity, unspecified: Secondary | ICD-10-CM | POA: Diagnosis not present

## 2016-06-12 DIAGNOSIS — N3289 Other specified disorders of bladder: Secondary | ICD-10-CM

## 2016-06-12 DIAGNOSIS — D494 Neoplasm of unspecified behavior of bladder: Secondary | ICD-10-CM | POA: Diagnosis not present

## 2016-06-12 DIAGNOSIS — Z7984 Long term (current) use of oral hypoglycemic drugs: Secondary | ICD-10-CM | POA: Insufficient documentation

## 2016-06-12 DIAGNOSIS — N200 Calculus of kidney: Secondary | ICD-10-CM

## 2016-06-12 DIAGNOSIS — Z880 Allergy status to penicillin: Secondary | ICD-10-CM | POA: Insufficient documentation

## 2016-06-12 DIAGNOSIS — Z6831 Body mass index (BMI) 31.0-31.9, adult: Secondary | ICD-10-CM | POA: Insufficient documentation

## 2016-06-12 DIAGNOSIS — I252 Old myocardial infarction: Secondary | ICD-10-CM | POA: Insufficient documentation

## 2016-06-12 DIAGNOSIS — K429 Umbilical hernia without obstruction or gangrene: Secondary | ICD-10-CM

## 2016-06-12 DIAGNOSIS — K432 Incisional hernia without obstruction or gangrene: Secondary | ICD-10-CM | POA: Insufficient documentation

## 2016-06-12 DIAGNOSIS — K760 Fatty (change of) liver, not elsewhere classified: Secondary | ICD-10-CM | POA: Diagnosis not present

## 2016-06-12 DIAGNOSIS — Z7982 Long term (current) use of aspirin: Secondary | ICD-10-CM | POA: Diagnosis not present

## 2016-06-12 DIAGNOSIS — Z809 Family history of malignant neoplasm, unspecified: Secondary | ICD-10-CM | POA: Insufficient documentation

## 2016-06-12 DIAGNOSIS — L409 Psoriasis, unspecified: Secondary | ICD-10-CM | POA: Diagnosis not present

## 2016-06-12 DIAGNOSIS — Z87891 Personal history of nicotine dependence: Secondary | ICD-10-CM | POA: Diagnosis not present

## 2016-06-12 DIAGNOSIS — R412 Retrograde amnesia: Secondary | ICD-10-CM

## 2016-06-12 DIAGNOSIS — Z955 Presence of coronary angioplasty implant and graft: Secondary | ICD-10-CM | POA: Diagnosis not present

## 2016-06-12 DIAGNOSIS — R9341 Abnormal radiologic findings on diagnostic imaging of renal pelvis, ureter, or bladder: Secondary | ICD-10-CM | POA: Diagnosis not present

## 2016-06-12 DIAGNOSIS — Z79899 Other long term (current) drug therapy: Secondary | ICD-10-CM | POA: Diagnosis not present

## 2016-06-12 HISTORY — PX: URETEROSCOPY: SHX842

## 2016-06-12 HISTORY — PX: UMBILICAL HERNIA REPAIR: SHX196

## 2016-06-12 HISTORY — PX: TRANSURETHRAL RESECTION OF BLADDER TUMOR WITH MITOMYCIN-C: SHX6459

## 2016-06-12 HISTORY — PX: CYSTOSCOPY W/ RETROGRADES: SHX1426

## 2016-06-12 LAB — GLUCOSE, CAPILLARY
Glucose-Capillary: 142 mg/dL — ABNORMAL HIGH (ref 65–99)
Glucose-Capillary: 151 mg/dL — ABNORMAL HIGH (ref 65–99)

## 2016-06-12 SURGERY — REPAIR, HERNIA, UMBILICAL, ADULT
Anesthesia: General | Laterality: Right | Wound class: Clean

## 2016-06-12 MED ORDER — HYDROCODONE-ACETAMINOPHEN 5-325 MG PO TABS: 1.0000 | ORAL_TABLET | Freq: Four times a day (QID) | ORAL | 0 refills | Status: DC | PRN

## 2016-06-12 MED ORDER — CHLORHEXIDINE GLUCONATE CLOTH 2 % EX PADS: 6.0000 | MEDICATED_PAD | Freq: Once | CUTANEOUS | Status: DC

## 2016-06-12 MED ORDER — ONDANSETRON HCL 4 MG/2ML IJ SOLN
INTRAMUSCULAR | Status: DC | PRN
  Administered 2016-06-12: 4 mg via INTRAVENOUS

## 2016-06-12 MED ORDER — CIPROFLOXACIN IN D5W 400 MG/200ML IV SOLN
400.0000 mg | INTRAVENOUS | Status: AC
  Administered 2016-06-12 (×2): 400 mg via INTRAVENOUS

## 2016-06-12 MED ORDER — PROPOFOL 10 MG/ML IV BOLUS
INTRAVENOUS | Status: AC
  Filled 2016-06-12: qty 20

## 2016-06-12 MED ORDER — FENTANYL CITRATE (PF) 100 MCG/2ML IJ SOLN
INTRAMUSCULAR | Status: DC | PRN
  Administered 2016-06-12 (×2): 50 ug via INTRAVENOUS

## 2016-06-12 MED ORDER — LIDOCAINE HCL (CARDIAC) 20 MG/ML IV SOLN
INTRAVENOUS | Status: DC | PRN
  Administered 2016-06-12: 50 mg via INTRAVENOUS

## 2016-06-12 MED ORDER — CEFAZOLIN SODIUM-DEXTROSE 2-4 GM/100ML-% IV SOLN
INTRAVENOUS | Status: AC
  Filled 2016-06-12: qty 100

## 2016-06-12 MED ORDER — LACTATED RINGERS IV SOLN
INTRAVENOUS | Status: DC | PRN
  Administered 2016-06-12: 08:00:00 via INTRAVENOUS

## 2016-06-12 MED ORDER — DOCUSATE SODIUM 100 MG PO CAPS: 100.0000 mg | ORAL_CAPSULE | Freq: Two times a day (BID) | ORAL | 0 refills | Status: DC

## 2016-06-12 MED ORDER — OXYBUTYNIN CHLORIDE 5 MG PO TABS: 5.0000 mg | ORAL_TABLET | Freq: Three times a day (TID) | ORAL | 0 refills | Status: DC | PRN

## 2016-06-12 MED ORDER — BUPIVACAINE HCL (PF) 0.5 % IJ SOLN
INTRAMUSCULAR | Status: AC
  Filled 2016-06-12: qty 30

## 2016-06-12 MED ORDER — MEPERIDINE HCL 25 MG/ML IJ SOLN: 6.2500 mg | INTRAMUSCULAR | Status: DC | PRN

## 2016-06-12 MED ORDER — SUCCINYLCHOLINE CHLORIDE 20 MG/ML IJ SOLN
INTRAMUSCULAR | Status: DC | PRN
  Administered 2016-06-12: 100 mg via INTRAVENOUS

## 2016-06-12 MED ORDER — CEFAZOLIN SODIUM-DEXTROSE 2-4 GM/100ML-% IV SOLN
2.0000 g | INTRAVENOUS | Status: AC
  Administered 2016-06-12: 2 g via INTRAVENOUS

## 2016-06-12 MED ORDER — FENTANYL CITRATE (PF) 100 MCG/2ML IJ SOLN
25.0000 ug | INTRAMUSCULAR | Status: DC | PRN
  Administered 2016-06-12 (×2): 25 ug via INTRAVENOUS

## 2016-06-12 MED ORDER — SODIUM CHLORIDE 0.9 % IV SOLN
INTRAVENOUS | Status: DC
  Administered 2016-06-12: 07:00:00 via INTRAVENOUS

## 2016-06-12 MED ORDER — ROCURONIUM BROMIDE 50 MG/5ML IV SOSY
PREFILLED_SYRINGE | INTRAVENOUS | Status: AC
  Filled 2016-06-12: qty 5

## 2016-06-12 MED ORDER — IOTHALAMATE MEGLUMINE 43 % IV SOLN
INTRAVENOUS | Status: DC | PRN
  Administered 2016-06-12: 22 mL via INTRAVESICAL

## 2016-06-12 MED ORDER — MITOMYCIN CHEMO FOR BLADDER INSTILLATION 40 MG
40.0000 mg | Freq: Once | INTRAVENOUS | Status: DC
  Filled 2016-06-12: qty 40

## 2016-06-12 MED ORDER — FENTANYL CITRATE (PF) 100 MCG/2ML IJ SOLN
INTRAMUSCULAR | Status: AC
Start: 2016-06-12 — End: 2016-06-12
  Administered 2016-06-12: 25 ug via INTRAVENOUS
  Filled 2016-06-12: qty 2

## 2016-06-12 MED ORDER — CIPROFLOXACIN IN D5W 400 MG/200ML IV SOLN
INTRAVENOUS | Status: AC
  Filled 2016-06-12: qty 200

## 2016-06-12 MED ORDER — MITOMYCIN CHEMO FOR BLADDER INSTILLATION 40 MG
INTRAVENOUS | Status: DC | PRN
  Administered 2016-06-12: 40 mg via INTRAVESICAL

## 2016-06-12 MED ORDER — SUGAMMADEX SODIUM 200 MG/2ML IV SOLN
INTRAVENOUS | Status: DC | PRN
  Administered 2016-06-12 (×2): 200 mg via INTRAVENOUS

## 2016-06-12 MED ORDER — OXYCODONE HCL 5 MG/5ML PO SOLN: 5.0000 mg | Freq: Once | ORAL | Status: AC | PRN

## 2016-06-12 MED ORDER — MIDAZOLAM HCL 2 MG/2ML IJ SOLN
INTRAMUSCULAR | Status: AC
  Filled 2016-06-12: qty 2

## 2016-06-12 MED ORDER — TRAMADOL HCL 50 MG PO TABS: 50.0000 mg | ORAL_TABLET | Freq: Four times a day (QID) | ORAL | 0 refills | Status: DC | PRN

## 2016-06-12 MED ORDER — FAMOTIDINE 20 MG PO TABS
ORAL_TABLET | ORAL | Status: AC
  Filled 2016-06-12: qty 1

## 2016-06-12 MED ORDER — BUPIVACAINE HCL (PF) 0.5 % IJ SOLN
INTRAMUSCULAR | Status: DC | PRN
  Administered 2016-06-12: 30 mL

## 2016-06-12 MED ORDER — OXYCODONE HCL 5 MG PO TABS
ORAL_TABLET | ORAL | Status: AC
  Filled 2016-06-12: qty 1

## 2016-06-12 MED ORDER — PROMETHAZINE HCL 25 MG/ML IJ SOLN: 6.2500 mg | INTRAMUSCULAR | Status: DC | PRN

## 2016-06-12 MED ORDER — EPHEDRINE SULFATE 50 MG/ML IJ SOLN
INTRAMUSCULAR | Status: DC | PRN
  Administered 2016-06-12 (×2): 10 mg via INTRAVENOUS

## 2016-06-12 MED ORDER — OXYCODONE HCL 5 MG PO TABS
5.0000 mg | ORAL_TABLET | Freq: Once | ORAL | Status: AC | PRN
  Administered 2016-06-12: 5 mg via ORAL

## 2016-06-12 MED ORDER — PROPOFOL 10 MG/ML IV BOLUS
INTRAVENOUS | Status: DC | PRN
  Administered 2016-06-12: 160 mg via INTRAVENOUS

## 2016-06-12 MED ORDER — FAMOTIDINE 20 MG PO TABS
20.0000 mg | ORAL_TABLET | Freq: Once | ORAL | Status: AC
Start: 2016-06-12 — End: 2016-06-12
  Administered 2016-06-12: 20 mg via ORAL

## 2016-06-12 MED ORDER — FENTANYL CITRATE (PF) 100 MCG/2ML IJ SOLN
INTRAMUSCULAR | Status: AC
  Filled 2016-06-12: qty 2

## 2016-06-12 MED ORDER — ROCURONIUM BROMIDE 100 MG/10ML IV SOLN
INTRAVENOUS | Status: DC | PRN
  Administered 2016-06-12: 50 mg via INTRAVENOUS
  Administered 2016-06-12: 20 mg via INTRAVENOUS

## 2016-06-12 MED ORDER — MIDAZOLAM HCL 2 MG/2ML IJ SOLN
INTRAMUSCULAR | Status: DC | PRN
  Administered 2016-06-12: 2 mg via INTRAVENOUS

## 2016-06-12 SURGICAL SUPPLY — 53 items
BAG DRAIN CYSTO-URO LG1000N (MISCELLANEOUS) IMPLANT
BAG URO DRAIN 2000ML W/SPOUT (MISCELLANEOUS) ×4 IMPLANT
BLADE SURG 15 STRL SS SAFETY (BLADE) ×4 IMPLANT
CANISTER SUCT 1200ML W/VALVE (MISCELLANEOUS) ×4 IMPLANT
CATH FOLEY 2WAY  5CC 16FR (CATHETERS) ×1
CATH URETL 5X70 OPEN END (CATHETERS) ×4 IMPLANT
CATH URTH 16FR FL 2W BLN LF (CATHETERS) ×3 IMPLANT
CHLORAPREP W/TINT 26ML (MISCELLANEOUS) ×4 IMPLANT
CONRAY 43 FOR UROLOGY 50M (MISCELLANEOUS) ×4 IMPLANT
DECANTER SPIKE VIAL GLASS SM (MISCELLANEOUS) ×4 IMPLANT
DERMABOND ADVANCED (GAUZE/BANDAGES/DRESSINGS) ×1
DERMABOND ADVANCED .7 DNX12 (GAUZE/BANDAGES/DRESSINGS) ×3 IMPLANT
DRAPE LAPAROTOMY 100X77 ABD (DRAPES) ×4 IMPLANT
DRAPE UTILITY 15X26 TOWEL STRL (DRAPES) ×4 IMPLANT
DRESSING TELFA 4X3 1S ST N-ADH (GAUZE/BANDAGES/DRESSINGS) ×4 IMPLANT
ELECT LOOP 22F BIPOLAR SML (ELECTROSURGICAL) ×4
ELECT REM PT RETURN 9FT ADLT (ELECTROSURGICAL) ×8
ELECTRODE LOOP 22F BIPOLAR SML (ELECTROSURGICAL) ×3 IMPLANT
ELECTRODE REM PT RTRN 9FT ADLT (ELECTROSURGICAL) ×6 IMPLANT
GLOVE BIO SURGEON STRL SZ 6.5 (GLOVE) ×4 IMPLANT
GLOVE BIO SURGEON STRL SZ7 (GLOVE) ×20 IMPLANT
GOWN STRL REUS W/ TWL LRG LVL3 (GOWN DISPOSABLE) ×15 IMPLANT
GOWN STRL REUS W/TWL LRG LVL3 (GOWN DISPOSABLE) ×5
GUIDEWIRE SUPER STIFF (WIRE) ×4 IMPLANT
KIT RM TURNOVER CYSTO AR (KITS) ×4 IMPLANT
KIT RM TURNOVER STRD PROC AR (KITS) ×4 IMPLANT
LABEL OR SOLS (LABEL) ×4 IMPLANT
LOOP CUT BIPOLAR 24F LRG (ELECTROSURGICAL) IMPLANT
NDL SAFETY ECLIPSE 18X1.5 (NEEDLE) ×3 IMPLANT
NEEDLE HYPO 18GX1.5 SHARP (NEEDLE) ×1
NEEDLE HYPO 25X1 1.5 SAFETY (NEEDLE) ×4 IMPLANT
NS IRRIG 500ML POUR BTL (IV SOLUTION) ×4 IMPLANT
PACK BASIN MINOR ARMC (MISCELLANEOUS) ×4 IMPLANT
PACK CYSTO AR (MISCELLANEOUS) ×4 IMPLANT
SCRUB POVIDONE IODINE 4 OZ (MISCELLANEOUS) ×4 IMPLANT
SENSORWIRE 0.038 NOT ANGLED (WIRE) ×4
SET CYSTO W/LG BORE CLAMP LF (SET/KITS/TRAYS/PACK) ×4 IMPLANT
SET IRRIG Y TYPE TUR BLADDER L (SET/KITS/TRAYS/PACK) ×4 IMPLANT
SET IRRIGATING DISP (SET/KITS/TRAYS/PACK) ×4 IMPLANT
SOL .9 NS 3000ML IRR  AL (IV SOLUTION) ×2
SOL .9 NS 3000ML IRR UROMATIC (IV SOLUTION) ×6 IMPLANT
SURGILUBE 2OZ TUBE FLIPTOP (MISCELLANEOUS) ×4 IMPLANT
SUT PROLENE 0 CT 2 (SUTURE) ×4 IMPLANT
SUT VIC AB 2-0 SH 27 (SUTURE) ×1
SUT VIC AB 2-0 SH 27XBRD (SUTURE) ×3 IMPLANT
SUT VIC AB 3-0 SH 27 (SUTURE) ×1
SUT VIC AB 3-0 SH 27X BRD (SUTURE) ×3 IMPLANT
SUT VIC AB 4-0 FS2 27 (SUTURE) ×4 IMPLANT
SUT VICRYL+ 3-0 144IN (SUTURE) ×4 IMPLANT
SYR CONTROL 10ML (SYRINGE) ×4 IMPLANT
SYRINGE IRR TOOMEY STRL 70CC (SYRINGE) ×4 IMPLANT
WATER STERILE IRR 1000ML POUR (IV SOLUTION) ×4 IMPLANT
WIRE SENSOR 0.038 NOT ANGLED (WIRE) ×3 IMPLANT

## 2016-06-12 NOTE — H&P (Signed)
05/12/2016  --> patient seen and examined on 06/12/16, no changes.  RRR. CTAB.  Dr. Jamal Collin to perform first surgery, GU to follow in OR as below.    4:51 PM   Dustin Blackburn 17-Jun-1940 [id number]  Referring provider: Ricardo Jericho, NP [address] [place], [place] [address]      Chief Complaint  Patient presents with  . New Patient (Initial Visit)    Bladder mass    HPI: 76 year old male referred today for further evaluation of incidental bladder wall thickening seen on CT of the abdomen/ pelvis with contrast on 04/19/16/ 2017. This study was ordered by Dr. Jamal Collin for further evaluation of abdominal wall hernias.  He does have an umbilical hernia containing fat as well as an incisional hernia.  He will likely be undergoing repair for these in January.  He denies a history of gross hematuria, urgency, frequency, or any other bladder issues. No urinary tract infections.  He does have a history of nephrolithiasis, previously followed by Dr. Jacqlyn Larsen.  No stone seen on most recent scan.  He does complain of some chronic right low back pain.  Former smoker/ chewing tobacco,  30 years, quit 25 years ago.     PMH:     Past Medical History:  Diagnosis Date  . Arthritis   . Asthma   . COPD (chronic obstructive pulmonary disease) ([facility])   . Diabetes mellitus without complication ([facility])   . GERD (gastroesophageal reflux disease)   . Heart attack 2016  . Psoriasis     Surgical History:      Past Surgical History:  Procedure Laterality Date  . CORONARY STENT PLACEMENT  2016  . HIATAL HERNIA REPAIR  1992  . LIPOMA EXCISION      Home Medications:      Allergies as of 05/12/2016      Reactions   Glimepiride Rash   Other reaction(s): RASH   Metformin Shortness Of Breath   Pt states he "can't breathe and it gives him sinus congestion". Other reaction(s): OTHER   Amoxicillin    Other reaction(s): NAUSEA   Erythromycin Base Nausea  Only   Other reaction(s): OTHER   Esomeprazole Magnesium Other (See Comments)   Other reaction(s): Other (See Comments) Makes patients heart burn worse Makes patients heart burn worse   Liraglutide Other (See Comments)   bloating Other reaction(s): Other (See Comments) bloating   Penicillins Nausea Only   Sitagliptin Other (See Comments)   Pt states it "gives him joint pain" & redness Other reaction(s): JOINT PAIN               Medication List           Accurate as of 05/12/16  4:51 PM. Always use your most recent med list.           albuterol (2.5 MG/3ML) 0.083% nebulizer solution Commonly known as:  PROVENTIL 2.5 mg.  aspirin EC 81 MG tablet Take 81 mg by mouth.  clopidogrel 75 MG tablet Commonly known as:  PLAVIX Take 75 mg by mouth.  DHEA Powd by Does not apply route.  glipiZIDE 5 MG tablet Commonly known as:  GLUCOTROL Take 5 mg by mouth. IS taking 2.5 mg 2 times a day  montelukast 10 MG tablet Commonly known as:  SINGULAIR Take by mouth.  predniSONE 20 MG tablet Commonly known as:  DELTASONE  Testosterone Cypionate Powd by Does not apply route.      Allergies:  Allergies  Allergen Reactions  . Glimepiride Rash    Other reaction(s): RASH  . Metformin Shortness Of Breath    Pt states he "can't breathe and it gives him sinus congestion". Other reaction(s): OTHER  . Amoxicillin     Other reaction(s): NAUSEA  . Erythromycin Base Nausea Only    Other reaction(s): OTHER  . Esomeprazole Magnesium Other (See Comments)    Other reaction(s): Other (See Comments) Makes patients heart burn worse Makes patients heart burn worse  . Liraglutide Other (See Comments)    bloating Other reaction(s): Other (See Comments) bloating  . Penicillins Nausea Only  . Sitagliptin Other (See Comments)    Pt states it "gives him joint pain" & redness Other reaction(s): JOINT PAIN    Family History:      Family History    Problem Relation Age of Onset  . Cancer Mother   . Prostate cancer Neg Hx   . Kidney cancer Neg Hx     Social History:  reports that he quit smoking about 25 years ago. His smoking use included Cigarettes. He quit after 30.00 years of use. He has never used smokeless tobacco. He reports that he does not drink alcohol or use drugs.  ROS: UROLOGY Frequent Urination?: Yes Hard to postpone urination?: No Burning/pain with urination?: No Get up at night to urinate?: Yes Leakage of urine?: No Urine stream starts and stops?: No Trouble starting stream?: No Do you have to strain to urinate?: No Blood in urine?: No Urinary tract infection?: No Sexually transmitted disease?: No Injury to kidneys or bladder?: No Painful intercourse?: No Weak stream?: No Erection problems?: Yes Penile pain?: No  Gastrointestinal Nausea?: No Vomiting?: No Indigestion/heartburn?: Yes Diarrhea?: No Constipation?: No  Constitutional Fever: No Night sweats?: No Weight loss?: No Fatigue?: Yes  Skin Skin rash/lesions?: No Itching?: Yes  Eyes Blurred vision?: No Double vision?: No  Ears/Nose/Throat Sore throat?: No Sinus problems?: Yes  Hematologic/Lymphatic Swollen glands?: No Easy bruising?: No  Cardiovascular Leg swelling?: No Chest pain?: No  Respiratory Cough?: No Shortness of breath?: Yes  Endocrine Excessive thirst?: No  Musculoskeletal Back pain?: No Joint pain?: No  Neurological Headaches?: No Dizziness?: Yes  Psychologic Depression?: No Anxiety?: No  Physical Exam: BP 130/85   Pulse 83   Ht 5\' 10"  (1.778 m)   Wt 230 lb (104.3 kg)   BMI 33.00 kg/m   Constitutional:  Alert and oriented, No acute distress.  Appears slightly older than stated age.  Accompanied by wife today. HEENT: [place] AT, moist mucus membranes.  Trachea midline, no masses. Cardiovascular: No clubbing, cyanosis, or edema. Respiratory: Normal respiratory effort, no  increased work of breathing. GI: Abdomen is soft, nontender, nondistended, no abdominal masses GU: No CVA tenderness.  Normal circumcised phallus with orthotopic meatus. Testicles descended bilaterally. Skin: No rashes, bruises or suspicious lesions. Neurologic: Grossly intact, no focal deficits, moving all 4 extremities. Psychiatric: Normal mood and affect.  Laboratory Data:  RecentLabs       Lab Results  Component Value Date   CREATININE 0.90 04/19/2016     PSA 0.6 on 10/11/2015  Urinalysis Labs(Brief)          Component Value Date/Time   COLORURINE YELLOW 05/12/2016 1506   APPEARANCEUR CLEAR 05/12/2016 1506   LABSPEC 1.015 05/12/2016 1506   PHURINE 5.0 05/12/2016 1506   GLUCOSEU >1000 (A) 05/12/2016 1506   HGBUR NEGATIVE 05/12/2016 1506   BILIRUBINUR NEGATIVE 05/12/2016 1506   KETONESUR TRACE (A) 05/12/2016 1506   PROTEINUR  NEGATIVE 05/12/2016 1506   NITRITE NEGATIVE 05/12/2016 1506   LEUKOCYTESUR NEGATIVE 05/12/2016 1506      Pertinent Imaging: CLINICAL DATA: Umbilical pain and bulging for several weeks  EXAM: CT ABDOMEN AND PELVIS WITH CONTRAST  TECHNIQUE: Multidetector CT imaging of the abdomen and pelvis was performed using the standard protocol following bolus administration of intravenous contrast.  CONTRAST: 124mL ISOVUE-300 IOPAMIDOL (ISOVUE-300) INJECTION 61%  COMPARISON: CT scan 10/11/2011  FINDINGS: Lower chest: Lung bases shows stable fibrotic changes left lower lobe laterally. Stable postsurgical changes left lateral chest wall.  Hepatobiliary: Enhanced liver shows no focal mass. Mild fatty infiltration. No calcified gallstones are noted within gallbladder.  Pancreas: Enhanced pancreas is normal. No surrounding inflammatory changes.  Spleen: Enhanced spleen is normal.  Adrenals/Urinary Tract: Multiple bilateral renal cysts are again noted. No hydronephrosis or hydroureter. No adrenal gland mass.  The largest cyst in upper pole of the left kidney measures 5.3 cm. The largest cyst in midpole of the right kidney measures 4 cm. Delayed renal images shows bilateral renal symmetrical excretion. Bilateral visualized proximal ureter is unremarkable.  Stomach/Bowel: No small bowel obstruction. No gastric outlet obstruction. The terminal ileum is unremarkable. No pericecal inflammation. The appendix is not identified. Moderate stool noted in transverse colon descending colon and proximal sigmoid colon. No colonic obstruction. No colitis or diverticulitis.  Vascular/Lymphatic: No aortic aneurysm. No adenopathy.  Reproductive: Prostate gland and seminal vesicles are unremarkable.  Other: A skin marker is noted in left anterior abdominal wall just lateral to umbilicus. There is a left paraumbilical/supraumbilical midline ventral hernia containing fat measures 2.9 cm without evidence of acute complication. This is best seen on sagittal image 97. There is no significant change from prior exam. Sagittal image 94 there is a tiny supraumbilical ventral hernia containing fat measures 1.3 cm. No evidence of acute complication. No definite mass is noted at the level of skin marker.  There is a right inguinal scrotal canal hernia containing fat without evidence of acute complication measures about 2.7 cm.  Axial image 68 there is focal thickening of left urinary bladder wall measures about 1 cm. This is confirmed on sagittal image 103. Highly suspicious for a bladder mass. Further correlation with urology exam and cystoscopy is recommended.  Musculoskeletal: No destructive bony lesions are noted. Sagittal images of the spine shows degenerative changes thoracolumbar spine. Degenerative changes bilateral SI joints. Degenerative changes pubic symphysis. Degenerative changes bilateral hip joints.  IMPRESSION: 1. There is no evidence of acute inflammatory process within abdomen. 2.  There is a left paraumbilical/supraumbilical midline ventral hernia containing fat measures 2.9 cm without evidence of acute complication. This is best seen on sagittal image 97. There is no significant change from prior exam. Sagittal image 94 there is a tiny supraumbilical ventral hernia containing fat measures 1.3 cm. No evidence of acute complication. No definite mass is noted at the level of skin marker. 3. Fatty infiltration of the liver. 4. Axial image 68 there is focal thickening of left urinary bladder wall measures about 1 cm. This is confirmed on sagittal image 103. Highly suspicious for a bladder mass. Further correlation with urology exam and cystoscopy is recommended. 5. Degenerative changes as described above.   Electronically Signed By: Lahoma Crocker M.D. On: 04/19/2016 11:17  CT scan personally reviewed today and with the patient  Cysto performed today as part of work up, see attached note- bladder mass identified today on cystoscopy, approximately 1 cm  Assessment & Plan:   1. 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 I have recommended proceeding to the operating room for TURBT, bilateral retrograde pyelogram, and instillation of mitomycin intravesically. Risks of the procedure were discussed today in detail including the risk of bleeding, infection, damage to bladder, bladder perforation, Foley catheterization, bladder irritation, cystitis amongst others. Received general anesthesia were also discussed.  He will need cardiac clearance to clear him to hold his aspirin prior to the procedure. Given that he will be likely undergoing surgery in January for his hernias, I will try to arrange for concomitant surgery. All his questions were answered today. - ciprofloxacin (CIPRO) tablet 500 mg; Take 1 tablet (500 mg total) by mouth once. - lidocaine  (XYLOCAINE) 2 % jelly 1 application; Place 1 application into the urethra once.  2. History of tobacco abuse Relationship between tobacco abuse and bladder cancer were discussed.  3. History of kidney stones No stones identified on CT scan with contrast Right lower back pain unlikely to be related to nephrolithiasis    Schedule surgery  Cc: Dr. Jamal Collin  Return for Schedule surgery.  Hollice Espy, MD  [facility] [address] [place], [place] [address] [phone]     Electronically signed by Hollice Espy, MD at 05/12/2016 4:58 PM

## 2016-06-12 NOTE — Anesthesia Procedure Notes (Signed)
Procedure Name: Intubation Date/Time: 06/12/2016 7:42 AM Performed by: VZCHYIF, Jalisa Sacco Pre-anesthesia Checklist: Patient identified, Timeout performed, Suction available, Emergency Drugs available and Patient being monitored Patient Re-evaluated:Patient Re-evaluated prior to inductionOxygen Delivery Method: Circle system utilized Preoxygenation: Pre-oxygenation with 100% oxygen Intubation Type: IV induction and Cricoid Pressure applied Laryngoscope Size: Mac and 4 Grade View: Grade II Tube type: Oral Tube size: 7.5 mm Number of attempts: 1 Airway Equipment and Method: Stylet Placement Confirmation: ETT inserted through vocal cords under direct vision,  breath sounds checked- equal and bilateral,  CO2 detector and positive ETCO2 Secured at: 22 cm Tube secured with: Tape

## 2016-06-12 NOTE — Anesthesia Postprocedure Evaluation (Signed)
Anesthesia Post Note  Patient: Dustin Blackburn  Procedure(s) Performed: Procedure(s) (LRB): HERNIA REPAIR UMBILICAL ADULT (N/A) TRANSURETHRAL RESECTION OF BLADDER TUMOR WITH MITOMYCIN-C (N/A) CYSTOSCOPY WITH RETROGRADE PYELOGRAM (Bilateral) URETEROSCOPY (Right)  Patient location during evaluation: PACU Anesthesia Type: General Level of consciousness: awake and alert and oriented Pain management: pain level controlled Vital Signs Assessment: post-procedure vital signs reviewed and stable Respiratory status: spontaneous breathing, nonlabored ventilation and respiratory function stable Cardiovascular status: blood pressure returned to baseline and stable Postop Assessment: no signs of nausea or vomiting Anesthetic complications: no     Last Vitals:  Vitals:   06/12/16 1025 06/12/16 1026  BP:  125/75  Pulse: 76 71  Resp: 10 10  Temp:      Last Pain:  Vitals:   06/12/16 1025  TempSrc:   PainSc: 4                  Mell Mellott

## 2016-06-12 NOTE — H&P (View-Only) (Signed)
Patient ID: Dustin Blackburn, male   DOB: [birth year], 76 y.o.   MRN: [id number]  Chief Complaint  Patient presents with  . Pre-op Exam    HPI Dustin Blackburn is a 76 y.o. male.  Here for preop umbilical hernia and bladder surgery with Dr Erlene Quan, scheduled for 06-12-16. No new symptoms. I have reviewed the history of present illness with the patient.   HPI  Past Medical History:  Diagnosis Date  . Arthritis   . Asthma   . Cold   . COPD (chronic obstructive pulmonary disease) ([facility])   . Diabetes mellitus without complication ([place])   . Dyspnea   . GERD (gastroesophageal reflux disease)   . Heart attack 2016  . Kidney cysts   . Kidney stones   . Psoriasis     Past Surgical History:  Procedure Laterality Date  . CORONARY STENT PLACEMENT  2016  . fatty tumors removed    . HIATAL HERNIA REPAIR  1992  . LIPOMA EXCISION    . SINUS EXPLORATION      Family History  Problem Relation Age of Onset  . Cancer Mother   . Prostate cancer Neg Hx   . Kidney cancer Neg Hx     Social History Social History  Substance Use Topics  . Smoking status: Former Smoker    Years: 30.00    Types: Cigarettes    Quit date: 05/29/1990  . Smokeless tobacco: Never Used  . Alcohol use No    Allergies  Allergen Reactions  . Glimepiride Rash    Other reaction(s): RASH  . Metformin Shortness Of Breath    Pt states he "can't breathe and it gives him sinus congestion". Other reaction(s): OTHER  . Amoxicillin     Other reaction(s): NAUSEA  . Erythromycin Base Nausea Only    Other reaction(s): OTHER  . Esomeprazole Magnesium Other (See Comments)    Other reaction(s): Other (See Comments) Makes patients heart burn worse Makes patients heart burn worse  . Liraglutide Other (See Comments)    bloating Other reaction(s): Other (See Comments) bloating  . Penicillins Nausea Only  . Sitagliptin Other (See Comments)    Pt states it "gives him joint pain" & redness Other reaction(s): JOINT PAIN     Current Outpatient Prescriptions  Medication Sig Dispense Refill  . albuterol (PROVENTIL) (2.5 MG/3ML) 0.083% nebulizer solution Take 2.5 mg by nebulization every 4 (four) hours as needed.     Marland Kitchen glipiZIDE (GLUCOTROL) 5 MG tablet Take 5 mg by mouth.     . montelukast (SINGULAIR) 10 MG tablet Take 10 mg by mouth at bedtime.     . Prasterone (DHEA) POWD by Does not apply route.    . Testosterone Cypionate POWD 7 Doses by Does not apply route at bedtime. 7 drops    . aspirin EC 81 MG tablet Take 81 mg by mouth.     No current facility-administered medications for this visit.     Review of Systems Review of Systems  Constitutional: Negative.   Respiratory: Negative.   Cardiovascular: Negative.     Blood pressure 124/70, pulse 88, resp. rate 14, height 5\' 10"  (1.778 m), weight 230 lb (104.3 kg).  Physical Exam Physical Exam  Constitutional: He is oriented to person, place, and time. He appears well-developed and well-nourished.  HENT:  Mouth/Throat: Oropharynx is clear and moist.  Eyes: Conjunctivae are normal. No scleral icterus.  Neck: Neck supple.  Cardiovascular: Normal rate, regular rhythm and normal heart sounds.  Pulmonary/Chest: Effort normal and breath sounds normal.  Abdominal: Soft. Normal appearance and bowel sounds are normal. There is no tenderness. A hernia is present.  Partially reducible 2 cm umbilical hernia. Diastasis recti present.  Lymphadenopathy:    He has no cervical adenopathy.  Neurological: He is alert and oriented to person, place, and time.  Skin: Skin is warm and dry.  Psychiatric: His behavior is normal.    Data Reviewed Prior notes  Assessment    Umbilical hernia. Diastasis recti. 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 If they develop symptoms of an  incarcerated hernia, they were encouraged to seek prompt medical attention.  I have recommended repair of the hernia using mesh on an outpatient basis in the near future. The risk of infection was reviewed. The role of prosthetic mesh to minimize the risk of recurrence was reviewed.     This information has been scribed by Karie Fetch RN, BSN,BC.   Alycia Cooperwood G 06/08/2016, 12:11 PM

## 2016-06-12 NOTE — Interval H&P Note (Signed)
History and Physical Interval Note:  06/12/2016 7:11 AM  Dustin Blackburn  has presented today for surgery, with the diagnosis of UMBILICAL HERNIA  The various methods of treatment have been discussed with the patient and family. After consideration of risks, benefits and other options for treatment, the patient has consented to  Procedure(s): HERNIA REPAIR UMBILICAL ADULT (N/A) TRANSURETHRAL RESECTION OF BLADDER TUMOR WITH MITOMYCIN-C (N/A) CYSTOSCOPY WITH RETROGRADE PYELOGRAM (Bilateral) as a surgical intervention .  The patient's history has been reviewed, patient examined, no change in status, stable for surgery.  I have reviewed the patient's chart and labs.  Questions were answered to the patient's satisfaction.     SANKAR,SEEPLAPUTHUR G

## 2016-06-12 NOTE — Op Note (Signed)
Preop diagnosis: Umbilical hernia and bladder tumor.  Post op diagnosis: Same  Operation: Repair umbilical hernia  Surgeon: Mckinley Jewel. Hollice Espy  Assistant:     Anesthesia: Gen.  Complications: None  EBL: Minimal  Drains: None  Description: This patient was found to have a bladder tumor in the course of assessment for a small symptomatic umbilical hernia. Preoperative urology consultation was obtained and decision made to repair the hernia and remove the bladder tumor at same time. Patient was put to sleep in the supine position the operating table in the umbilical area was prepped and draped sterile field. Timeout was. A vertical incision starting just above the umbilicus and encircling on the left side and a little bit below the umbilicus was made. It was then dissected down to expose the 2 hernial protrusions of about 2 cm long. Both were just preperitoneal fatty tissue. They could not be easily reduced and there were mildly in thickened consistent with chronicity. The fascial defects measured3 mm in the superior one and  4-5 mm in the inferior. Both hernia protrusion were then transected ligated with 3-0 Vicryl and reduced. The fascial openings were then closed with the 0 proline stitches. The subcutaneous tissue was then approximated with a running 3-0 Vicryl. Skin was closed with subcuticular 4-0 Vicryl covered with Dermabond. 30 mL of 0.5% Marcaine was instilled around the incision.  Patient was then turned over to Dr. Erlene Quan to perform the cystoscopy and resection of bladder tumor.

## 2016-06-12 NOTE — Anesthesia Preprocedure Evaluation (Signed)
Anesthesia Evaluation  Patient identified by MRN, date of birth, ID band Patient awake    Reviewed: Allergy & Precautions, NPO status , Patient's Chart, lab work & pertinent test results  History of Anesthesia Complications Negative for: history of anesthetic complications  Airway Mallampati: II  TM Distance: >3 FB Neck ROM: Full    Dental  (+) Upper Dentures   Pulmonary asthma , neg sleep apnea, COPD,  COPD inhaler, former smoker,    breath sounds clear to auscultation- rhonchi (-) wheezing      Cardiovascular Exercise Tolerance: Good (-) angina+ CAD, + Past MI and + Cardiac Stents (~ 1 year ago had MI and one stent placed)  (-) CABG  Rhythm:Regular Rate:Normal - Systolic murmurs and - Diastolic murmurs    Neuro/Psych negative neurological ROS  negative psych ROS   GI/Hepatic Neg liver ROS, GERD  ,  Endo/Other  diabetes, Type 2, Oral Hypoglycemic Agents  Renal/GU Renal disease: hx of nephrolithiasis.     Musculoskeletal  (+) Arthritis ,   Abdominal (+) + obese,   Peds  Hematology negative hematology ROS (+)   Anesthesia Other Findings Past Medical History: No date: Arthritis No date: Asthma No date: Cold No date: COPD (chronic obstructive pulmonary disease) (* No date: Diabetes mellitus without complication ([facility]) No date: Dyspnea No date: GERD (gastroesophageal reflux disease) 2016: Heart attack No date: Kidney cysts No date: Kidney stones No date: Psoriasis   Reproductive/Obstetrics                             Anesthesia Physical Anesthesia Plan  ASA: III  Anesthesia Plan: General   Post-op Pain Management:    Induction: Intravenous  Airway Management Planned: Oral ETT  Additional Equipment:   Intra-op Plan:   Post-operative Plan: Extubation in OR  Informed Consent: I have reviewed the patients History and Physical, chart, labs and discussed the procedure  including the risks, benefits and alternatives for the proposed anesthesia with the patient or authorized representative who has indicated his/her understanding and acceptance.   Dental advisory given  Plan Discussed with: CRNA and Anesthesiologist  Anesthesia Plan Comments:         Anesthesia Quick Evaluation

## 2016-06-12 NOTE — Transfer of Care (Signed)
Immediate Anesthesia Transfer of Care Note  Patient: Dustin Blackburn  Procedure(s) Performed: Procedure(s): HERNIA REPAIR UMBILICAL ADULT (N/A) TRANSURETHRAL RESECTION OF BLADDER TUMOR WITH MITOMYCIN-C (N/A) CYSTOSCOPY WITH RETROGRADE PYELOGRAM (Bilateral) URETEROSCOPY (Right)  Patient Location: PACU  Anesthesia Type:General  Level of Consciousness: awake  Airway & Oxygen Therapy: Patient Spontanous Breathing and Patient connected to face mask oxygen  Post-op Assessment: Report given to RN  Post vital signs: Reviewed and stable  Last Vitals:  Vitals:   06/12/16 0941 06/12/16 0943  BP: 138/78 138/78  Pulse: 77 76  Resp: 13 12  Temp: 36.3 C 36.4 C    Last Pain:  Vitals:   06/12/16 0617  TempSrc: Tympanic         Complications: No apparent anesthesia complications

## 2016-06-12 NOTE — Discharge Instructions (Signed)
Hernia, Adult A hernia is the bulging of an organ or tissue through a weak spot in the muscles of the abdomen (abdominal wall). Hernias develop most often near the navel or groin. There are many kinds of hernias. Common kinds include:  Femoral hernia. This kind of hernia develops under the groin in the upper thigh area.  Inguinal hernia. This kind of hernia develops in the groin or scrotum.  Umbilical hernia. This kind of hernia develops near the navel.  Hiatal hernia. This kind of hernia causes part of the stomach to be pushed up into the chest.  Incisional hernia. This kind of hernia bulges through a scar from an abdominal surgery. What are the causes? This condition may be caused by:  Heavy lifting.  Coughing over a long period of time.  Straining to have a bowel movement.  An incision made during an abdominal surgery.  A birth defect (congenital defect).  Excess weight or obesity.  Smoking.  Poor nutrition.  Cystic fibrosis.  Excess fluid in the abdomen.  Undescended testicles. What are the signs or symptoms? Symptoms of a hernia include:  A lump on the abdomen. This is the first sign of a hernia. The lump may become more obvious with standing, straining, or coughing. It may get bigger over time if it is not treated or if the condition causing it is not treated.  Pain. A hernia is usually painless, but it may become painful over time if treatment is delayed. The pain is usually dull and may get worse with standing or lifting heavy objects. Sometimes a hernia gets tightly squeezed in the weak spot (strangulated) or stuck there (incarcerated) and causes additional symptoms. These symptoms may include:  Vomiting.  Nausea.  Constipation.  Irritability. How is this diagnosed? A hernia may be diagnosed with:  A physical exam. During the exam your health care provider may ask you to cough or to make a specific movement, because a hernia is usually more visible  when you move.  Imaging tests. These can include:  X-rays.  Ultrasound.  CT scan. How is this treated? A hernia that is small and painless may not need to be treated. A hernia that is large or painful may be treated with surgery. Inguinal hernias may be treated with surgery to prevent incarceration or strangulation. Strangulated hernias are always treated with surgery, because lack of blood to the trapped organ or tissue can cause it to die. Surgery to treat a hernia involves pushing the bulge back into place and repairing the weak part of the abdomen. Follow these instructions at home:  Avoid straining.  Do not lift anything heavier than 10 lb (4.5 kg).  Lift with your leg muscles, not your back muscles. This helps avoid strain.  When coughing, try to cough gently.  Prevent constipation. Constipation leads to straining with bowel movements, which can make a hernia worse or cause a hernia repair to break down. You can prevent constipation by:  Eating a high-fiber diet that includes plenty of fruits and vegetables.  Drinking enough fluids to keep your urine clear or pale yellow. Aim to drink 6-8 glasses of water per day.  Using a stool softener as directed by your health care provider.  Lose weight, if you are overweight.  Do not use any tobacco products, including cigarettes, chewing tobacco, or electronic cigarettes. If you need help quitting, ask your health care provider.  Keep all follow-up visits as directed by your health care provider. This is important. Your  health care provider may need to monitor your condition. Contact a health care provider if:  You have swelling, redness, and pain in the affected area.  Your bowel habits change. Get help right away if:  You have a fever.  You have abdominal pain that is getting worse.  You feel nauseous or you vomit.  You cannot push the hernia back in place by gently pressing on it while you are lying down.  The  hernia:  Changes in shape or size.  Is stuck outside the abdomen.  Becomes discolored.  Feels hard or tender. This information is not intended to replace advice given to you by your health care provider. Make sure you discuss any questions you have with your health care provider. Document Released: 05/15/2005 Document Revised: 10/13/2015 Document Reviewed: 03/25/2014 Elsevier Interactive Patient Education  2017 [employer] Transurethral Resection of Bladder Tumor (TURBT) or Bladder Biopsy   Definition:  Transurethral Resection of the Bladder Tumor is a surgical procedure used to diagnose and remove tumors within the bladder. TURBT is the most common treatment for early stage bladder cancer.  General instructions:     Your recent bladder surgery requires very little post hospital care but some definite precautions.  Despite the fact that no skin incisions were used, the area around the bladder incisions are raw and covered with scabs to promote healing and prevent bleeding. Certain precautions are needed to insure that the scabs are not disturbed over the next 2-4 weeks while the healing proceeds.  Because the raw surface inside your bladder and the irritating effects of urine you may expect frequency of urination and/or urgency (a stronger desire to urinate) and perhaps even getting up at night more often. This will usually resolve or improve slowly over the healing period. You may see some blood in your urine over the first 6 weeks. Do not be alarmed, even if the urine was clear for a while. Get off your feet and drink lots of fluids until clearing occurs. If you start to pass clots or don't improve call us.  Diet:  You may return to your normal diet immediately. Because of the raw surface of your bladder, alcohol, spicy foods, foods high in acid and drinks with caffeine may cause irritation or frequency and should be used in moderation. To keep your urine flowing freely and avoid  constipation, drink plenty of fluids during the day (8-10 glasses). Tip: Avoid cranberry juice because it is very acidic.  Activity:  Your physical activity doesn't need to be restricted. However, if you are very active, you may see some blood in the urine. We suggest that you reduce your activity under the circumstances until the bleeding has stopped.  Bowels:  It is important to keep your bowels regular during the postoperative period. Straining with bowel movements can cause bleeding. A bowel movement every other day is reasonable. Use a mild laxative if needed, such as milk of magnesia 2-3 tablespoons, or 2 Dulcolax tablets. Call if you continue to have problems. If you had been taking narcotics for pain, before, during or after your surgery, you may be constipated. Take a laxative if necessary.    Medication:  You should resume your pre-surgery medications unless told not to. In addition you may be given an antibiotic to prevent or treat infection. Antibiotics are not always necessary. All medication should be taken as prescribed until the bottles are finished unless you are having an unusual reaction to one of the drugs.  [facility] [address], [place], [place] [address] [phone]   AMBULATORY SURGERY  DISCHARGE INSTRUCTIONS   1) The drugs that you were given will stay in your system until tomorrow so for the next 24 hours you should not:  A) Drive an automobile B) Make any legal decisions C) Drink any alcoholic beverage   2) You may resume regular meals tomorrow.  Today it is better to start with liquids and gradually work up to solid foods.  You may eat anything you prefer, but it is better to start with liquids, then soup and crackers, and gradually work up to solid foods.   3) Please notify your doctor immediately if you have any unusual bleeding, trouble breathing, redness and pain at the surgery site, drainage, fever, or  pain not relieved by medication.    4) Additional Instructions:        Please contact your physician with any problems or Same Day Surgery at [phone], Monday through Friday 6 am to 4 pm, or  at [facility] number at [phone].

## 2016-06-12 NOTE — Op Note (Signed)
Date of procedure: 06/12/16  Preoperative diagnosis:  1. Bladder tumor  Postoperative diagnosis:  1. Same as above   Procedure: 1. TURBT, small 2. Bilateral retrograde pyelogram 3. Right diagnostic ureteroscopy 4. Instillation of intravesical mitomycin  Surgeon: Hollice Espy, MD  Anesthesia: General  Complications: None  Intraoperative findings: Retrograde on the right side with initial filling defect in the mid and lower pole. Diagnostic ureteroscopy confirmed no pathology, consistent with air bubble. 1 cm papillary tumor on a broad base involving the left dome of the bladder. No other bladder tumors identified.  EBL: Minimal  Specimens: Bladder tumor, deep base of bladder tumor  Drains: 16 French Foley catheter  Indication: Dustin Blackburn is a 76 y.o. patient with incidentally identified bladder tumor on the left dome of the bladder. He was counseled to undergo resection of this..  After reviewing the management options for treatment, he elected to proceed with the above surgical procedure(s). We have discussed the potential benefits and risks of the procedure, side effects of the proposed treatment, the likelihood of the patient achieving the goals of the procedure, and any potential problems that might occur during the procedure or recuperation. Informed consent has been obtained.  Description of procedure:  The patient was taken to the operating room and general anesthesia was induced.  Dr. Jamal Collin initially performed the umbilical hernia repair and my case followed in the same room under the same anesthetic. Please see his note for details.  The patient was placed in the dorsal lithotomy position, prepped and draped in the usual sterile fashion, and preoperative antibiotics were administered. A preoperative time-out was performed.   A 21 French cystoscope was advanced per urethra into the bladder. Careful inspection of the bladder revealed the tumor question of the left dome  of the bladder proximal and 1 cm in size on a broad base, papillary in nature. There are no other intravesical tumors identified. Bladder is mildly trabeculated. He did have an enlarged prostate with trilobar coaptation and a normal bladder neck. The trigone was easily visible. Attention was turned to the left ureteral orifice which was cannulated using a 5 [place] open-ended ureteral catheter just within the UO. A gentle retrograde pyelogram was performed on this side which revealed no filling defects or hydroureteronephrosis. Attention was then turned to the right ureteral orifice and the same procedure was performed. On this side, however, the mid and lower pole of the kidney did not fill concerning for possible filling defect. The open-ended ureteral catheter was advanced over the wire to the mid ureter and additional images were obtained. This revealed a persistent concerning finding. As such, a second Super Stiff wire was introduced up to level of the kidney. A 7 French flexible ureteroscope was then advanced over the Super Stiff wire up to level of the kidney with no difficulty or resistance. This was done under fluoroscopic guidance. The upper tract collecting system was then directly visualized and was essentially unremarkable. Within the mid and lower pole of the kidney, there was a air bubble which likely caused the filling defect. While up in the upper tract, a second retropyelogram was performed through the scope which was unremarkable. These images were saved to PACS. The scope was then backed down the length of the ureter inspecting the ureter along the way. This was also unremarkable. The wires were removed. No stent was left in place given the minimal trauma with diagnostic ureteroscopy.  Next, the cystoscope was exchanged for a 26 [place] resectoscope. Using  cold cup biopsy forceps, the tumor was enucleated in a piece wise fashion. This was passed off the field as tumor specimen. The base of the  tumor was then sampled using the same cold cup biopsy forceps down to the muscularis propria which was easily seen. This is labeled as deep base. No additional tumors were identified. The loop of the bipolar resectoscope was then use to achieve adequate hemostasis. Once this was deemed sufficient, the bladder was drained and the scope was removed. A 16 French Foley catheter was then placed without difficulty. The balloon was filled with 10 cc of sterile water. He was then repositioned the supine position, reversed from anesthesia, taken the PACU in stable condition.  40 mg of mitomycin was allowed to dwell within the bladder for 1 hour and the PACU. This was well-tolerated. Once complete, the bladder was drained and the Foley was removed.  Plan: Patient will follow-up in 1 week for pathology results reviewed. If the pathology is consistent with low-grade superficial tumor, we'll call the patient prior to this follow-up to arrange for 3 month cystoscopy. If higher grade or stage, we'll have the patient come in to discuss further management.  Hollice Espy, M.D.

## 2016-06-14 ENCOUNTER — Telehealth: Payer: Self-pay | Admitting: Urology

## 2016-06-14 LAB — SURGICAL PATHOLOGY

## 2016-06-14 NOTE — Telephone Encounter (Signed)
Surgical pathology reviewed with the patient by telephone today. 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  Given the relatively small size of the tumor and its low-grade nature, no further intervention or treatment is recommended.  I have recommended close surveillance with every 3 month cystoscopy.  All questions were answered.  Hollice Espy, MD

## 2016-06-16 ENCOUNTER — Ambulatory Visit: Payer: Medicare Other | Admitting: Urology

## 2016-06-16 NOTE — Telephone Encounter (Signed)
Patient has been notified and will arrive at 1:00 to go to the lab first for a UA  App is in [facility]

## 2016-06-20 ENCOUNTER — Ambulatory Visit (INDEPENDENT_AMBULATORY_CARE_PROVIDER_SITE_OTHER): Payer: Medicare HMO | Admitting: General Surgery

## 2016-06-20 ENCOUNTER — Encounter: Payer: Self-pay | Admitting: General Surgery

## 2016-06-20 VITALS — BP 124/70 | HR 72 | Resp 14 | Ht 70.0 in | Wt 225.0 lb

## 2016-06-20 DIAGNOSIS — K429 Umbilical hernia without obstruction or gangrene: Secondary | ICD-10-CM

## 2016-06-20 NOTE — Progress Notes (Addendum)
Patient ID: Dustin Blackburn, male   DOB: 10-07-1940, 76 y.o.   MRN: [id number]  Chief Complaint  Patient presents with  . Routine Post Op    HPI Dustin Blackburn is a 76 y.o. male who is here today for postoperative visit, resection bladder tumor and umbilical hernia on [id number]. He states he is doing well. Denies any gastrointestinal issues, bowels are moving regular. He is complaining of a "lump in throat" since surgery. States he has tried salt-water rinses with minimal relief. I have reviewed the history of present illness with the patient.  HPI  Past Medical History:  Diagnosis Date  . Arthritis   . Asthma   . Cold   . COPD (chronic obstructive pulmonary disease) ([place])   . Diabetes mellitus without complication ([place])   . Dyspnea   . GERD (gastroesophageal reflux disease)   . Heart attack 2016  . Kidney cysts   . Kidney stones   . Psoriasis     Past Surgical History:  Procedure Laterality Date  . CORONARY STENT PLACEMENT  2016  . CYSTOSCOPY W/ RETROGRADES Bilateral 06/12/2016   Procedure: CYSTOSCOPY WITH RETROGRADE PYELOGRAM;  Surgeon: Hollice Espy, MD;  Location: [facility] ORS;  Service: Urology;  Laterality: Bilateral;  . fatty tumors removed    . HIATAL HERNIA REPAIR  1992  . LIPOMA EXCISION    . SINUS EXPLORATION    . TRANSURETHRAL RESECTION OF BLADDER TUMOR WITH MITOMYCIN-C N/A 06/12/2016   Procedure: TRANSURETHRAL RESECTION OF BLADDER TUMOR WITH MITOMYCIN-C;  Surgeon: Hollice Espy, MD;  Location: [facility] ORS;  Service: Urology;  Laterality: N/A;  . UMBILICAL HERNIA REPAIR N/A 06/12/2016   Procedure: HERNIA REPAIR UMBILICAL ADULT;  Surgeon: Christene Lye, MD;  Location: [facility] ORS;  Service: General;  Laterality: N/A;  . URETEROSCOPY Right 06/12/2016   Procedure: URETEROSCOPY;  Surgeon: Hollice Espy, MD;  Location: [facility] ORS;  Service: Urology;  Laterality: Right;    Family History  Problem Relation Age of Onset  . Cancer Mother   . Prostate cancer Neg Hx   .  Kidney cancer Neg Hx     Social History Social History  Substance Use Topics  . Smoking status: Former Smoker    Years: 30.00    Types: Cigarettes    Quit date: 05/29/1990  . Smokeless tobacco: Never Used  . Alcohol use Yes     Comment: rarely    Allergies  Allergen Reactions  . Glimepiride Rash    Other reaction(s): RASH  . Metformin Shortness Of Breath    Pt states he "can't breathe and it gives him sinus congestion". Other reaction(s): OTHER  . Amoxicillin     Other reaction(s): NAUSEA  . Erythromycin Base Nausea Only    Other reaction(s): OTHER  . Esomeprazole Magnesium Other (See Comments)    Other reaction(s): Other (See Comments) Makes patients heart burn worse Makes patients heart burn worse  . Liraglutide Other (See Comments)    bloating Other reaction(s): Other (See Comments) bloating  . Penicillins Nausea Only  . Sitagliptin Other (See Comments)    Pt states it "gives him joint pain" & redness Other reaction(s): JOINT PAIN    Current Outpatient Prescriptions  Medication Sig Dispense Refill  . albuterol (PROVENTIL) (2.5 MG/3ML) 0.083% nebulizer solution Take 2.5 mg by nebulization every 4 (four) hours as needed.     Marland Kitchen aspirin EC 81 MG tablet Take 81 mg by mouth.    . docusate sodium (COLACE) 100 MG capsule Take 1 capsule (100  mg total) by mouth 2 (two) times daily. 60 capsule 0  . glipiZIDE (GLUCOTROL) 5 MG tablet Take 5 mg by mouth.     . montelukast (SINGULAIR) 10 MG tablet Take 10 mg by mouth at bedtime.     Marland Kitchen oxybutynin (DITROPAN) 5 MG tablet Take 1 tablet (5 mg total) by mouth every 8 (eight) hours as needed for bladder spasms. 30 tablet 0  . Prasterone (DHEA) POWD by Does not apply route.    . Testosterone Cypionate POWD 7 Doses by Does not apply route at bedtime. 7 drops     No current facility-administered medications for this visit.     Review of Systems Review of Systems  Constitutional: Negative.   Respiratory: Negative.   Cardiovascular:  Negative.     Blood pressure 124/70, pulse 72, resp. rate 14, height 5\' 10"  (1.778 m), weight 225 lb (102.1 kg).  Physical Exam Physical Exam  Constitutional: He is oriented to person, place, and time. He appears well-developed and well-nourished.  HENT:  Mouth/Throat: No oropharyngeal exudate.  Eyes: Conjunctivae are normal. No scleral icterus.  Abdominal: Soft. Normal appearance and bowel sounds are normal. There is no tenderness. No hernia.    Clean abdominal incision.  Neurological: He is alert and oriented to person, place, and time.  Skin: Skin is warm and dry.  Psychiatric: His behavior is normal.    Data Reviewed Prior notes  Assessment    Post-operative umbilical hernia repair and cystoscopic bladder tumor resection. Incision site has healed well, good progress so far.     Plan    May return to normal activities in one week. Call office in one week if throat symptoms have not improved. Follow up in 4 weeks.      This information has been scribed by Karie Fetch RN, BSN,BC.  Karmella Bouvier G 07/26/2016, 8:16 AM

## 2016-06-20 NOTE — Patient Instructions (Addendum)
May return to normal activities in one week. Call office in one week if throat symptoms have not improved. Follow up in 4 weeks.

## 2016-07-25 ENCOUNTER — Ambulatory Visit: Payer: Medicare HMO | Admitting: General Surgery

## 2016-07-25 ENCOUNTER — Other Ambulatory Visit: Payer: Self-pay | Admitting: Gastroenterology

## 2016-07-25 DIAGNOSIS — R058 Other specified cough: Secondary | ICD-10-CM

## 2016-07-25 DIAGNOSIS — R05 Cough: Secondary | ICD-10-CM

## 2016-07-25 DIAGNOSIS — R1310 Dysphagia, unspecified: Secondary | ICD-10-CM

## 2016-07-26 ENCOUNTER — Ambulatory Visit
Admission: RE | Admit: 2016-07-26 | Discharge: 2016-07-26 | Disposition: A | Discharge status: Home / Self Care | Payer: Medicare HMO | Source: Ambulatory Visit | Attending: Gastroenterology | Admitting: Gastroenterology

## 2016-07-26 DIAGNOSIS — R1310 Dysphagia, unspecified: Secondary | ICD-10-CM | POA: Diagnosis not present

## 2016-07-26 DIAGNOSIS — R05 Cough: Secondary | ICD-10-CM | POA: Diagnosis present

## 2016-07-26 DIAGNOSIS — K228 Other specified diseases of esophagus: Secondary | ICD-10-CM | POA: Diagnosis not present

## 2016-07-26 DIAGNOSIS — R058 Other specified cough: Secondary | ICD-10-CM

## 2016-09-07 ENCOUNTER — Encounter: Payer: Self-pay | Admitting: *Deleted

## 2016-09-14 ENCOUNTER — Other Ambulatory Visit: Payer: Self-pay

## 2016-09-14 DIAGNOSIS — N3289 Other specified disorders of bladder: Secondary | ICD-10-CM

## 2016-09-15 ENCOUNTER — Encounter: Payer: Self-pay | Admitting: Urology

## 2016-09-15 ENCOUNTER — Other Ambulatory Visit
Admission: RE | Admit: 2016-09-15 | Discharge: 2016-09-15 | Disposition: A | Discharge status: Home / Self Care | Payer: Medicare HMO | Source: Ambulatory Visit | Attending: Urology | Admitting: Urology

## 2016-09-15 ENCOUNTER — Ambulatory Visit (INDEPENDENT_AMBULATORY_CARE_PROVIDER_SITE_OTHER): Payer: Medicare HMO | Admitting: Urology

## 2016-09-15 VITALS — BP 114/64 | HR 91 | Ht 70.0 in | Wt 212.0 lb

## 2016-09-15 DIAGNOSIS — C679 Malignant neoplasm of bladder, unspecified: Secondary | ICD-10-CM | POA: Diagnosis not present

## 2016-09-15 DIAGNOSIS — C671 Malignant neoplasm of dome of bladder: Secondary | ICD-10-CM

## 2016-09-15 DIAGNOSIS — N3289 Other specified disorders of bladder: Secondary | ICD-10-CM | POA: Diagnosis present

## 2016-09-15 LAB — URINALYSIS, COMPLETE (UACMP) WITH MICROSCOPIC
Bacteria, UA: NONE SEEN
Bilirubin Urine: NEGATIVE
Glucose, UA: 500 mg/dL — AB
Hgb urine dipstick: NEGATIVE
Leukocytes, UA: NEGATIVE
Nitrite: NEGATIVE
Protein, ur: NEGATIVE mg/dL
Specific Gravity, Urine: 1.025 (ref 1.005–1.030)
Squamous Epithelial / LPF: NONE SEEN
pH: 5.5 (ref 5.0–8.0)

## 2016-09-15 MED ORDER — CIPROFLOXACIN HCL 500 MG PO TABS
500.0000 mg | ORAL_TABLET | Freq: Once | ORAL | Status: AC
  Administered 2016-09-15: 500 mg via ORAL

## 2016-09-15 MED ORDER — LIDOCAINE HCL 2 % EX GEL
1.0000 | Freq: Once | CUTANEOUS | Status: AC
Start: 2016-09-15 — End: 2016-09-15
  Administered 2016-09-15: 1 via URETHRAL

## 2016-09-15 NOTE — Progress Notes (Signed)
   09/15/16  CC:  Chief Complaint  Patient presents with  . Cysto    HPI:  76 year old male with a history of low-grade noninvasive bladder cancer returns today for office cystoscopy.  He was initially found to have incidental bladder wall thickening on CT abdomen pelvis on 03/2016. Cystoscopy showed a papillary proximal and 1 cm tumor at the dome of the bladder. He was taken to the operating room on 06/12/2016 for TURBT, bilateral retrograde, and insufflation mitomycin.  Surgical pathology was consistent with low-grade, noninvasive tumor.  Blood pressure 114/64, pulse 91, height 5\' 10"  (1.778 m), weight 212 lb (96.2 kg). NED. A&Ox3.   No respiratory distress   Abd soft, NT, ND Normal phallus with bilateral descended testicles  Cystoscopy Procedure Note  Patient identification was confirmed, informed consent was obtained, and patient was prepped using Betadine solution.  Lidocaine jelly was administered per urethral meatus.    Preoperative abx where received prior to procedure.     Pre-Procedure: - Inspection reveals a normal caliber ureteral meatus.  Procedure: The flexible cystoscope was introduced without difficulty - No urethral strictures/lesions are present. - Enlarged prostate  - Normal bladder neck - Bilateral ureteral orifices identified - Bladder mucosa  reveals no ulcers, tumors, or lesions.  Slight erythema at the dome of the bladder at previous resection bed unremarkable. - No bladder stones - No trabeculation  Retroflexion unremarkable  Post-Procedure: - Patient tolerated the procedure well  Assessment/ Plan:  1. 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 - lidocaine (XYLOCAINE) 2 % jelly 1 application; Place 1 application into the urethra  once.  Hollice Espy, MD

## 2016-10-02 ENCOUNTER — Ambulatory Visit: Admit: 2016-10-02 | Payer: Medicare HMO | Admitting: Unknown Physician Specialty

## 2016-10-02 SURGERY — ESOPHAGOGASTRODUODENOSCOPY (EGD) WITH PROPOFOL
Anesthesia: General

## 2016-12-18 ENCOUNTER — Telehealth: Payer: Self-pay | Admitting: Urology

## 2016-12-18 NOTE — Telephone Encounter (Signed)
Patient will bring in the CD prior to his app on 12-29-16 in [place] for you to review here in [place].

## 2016-12-18 NOTE — Telephone Encounter (Signed)
He had a CT abd just last year and these cysts where not worrisome.  He needs to obtain and bring the disk and we can compare nonurgenlty.    Hollice Espy, MD

## 2016-12-18 NOTE — Telephone Encounter (Signed)
Patient called today and said that he had a CT scan done at [facility] on 12-02-16 and thinks that he needs to see you. I had Carrie pull it up and he had a chest CT done and in the findings it says that he has a large lobulated renal cyst on the left kidney-possible hypo attnenating cyst in superior pole of the right kidney. Does he need to be seen and if so how soon? Please advise   Thanks,  Sharyn Lull

## 2016-12-21 ENCOUNTER — Ambulatory Visit
Admission: RE | Admit: 2016-12-21 | Discharge: 2016-12-21 | Disposition: A | Discharge status: Home / Self Care | Payer: Medicare HMO | Source: Ambulatory Visit | Attending: Cardiology | Admitting: Cardiology

## 2016-12-21 ENCOUNTER — Encounter
Admission: RE | Disposition: A | Discharge status: Home / Self Care | Payer: Self-pay | Source: Ambulatory Visit | Attending: Cardiology

## 2016-12-21 DIAGNOSIS — Z7982 Long term (current) use of aspirin: Secondary | ICD-10-CM | POA: Diagnosis not present

## 2016-12-21 DIAGNOSIS — M1990 Unspecified osteoarthritis, unspecified site: Secondary | ICD-10-CM | POA: Insufficient documentation

## 2016-12-21 DIAGNOSIS — I25119 Atherosclerotic heart disease of native coronary artery with unspecified angina pectoris: Secondary | ICD-10-CM | POA: Diagnosis not present

## 2016-12-21 DIAGNOSIS — J449 Chronic obstructive pulmonary disease, unspecified: Secondary | ICD-10-CM | POA: Diagnosis not present

## 2016-12-21 DIAGNOSIS — Z7951 Long term (current) use of inhaled steroids: Secondary | ICD-10-CM | POA: Insufficient documentation

## 2016-12-21 DIAGNOSIS — E782 Mixed hyperlipidemia: Secondary | ICD-10-CM | POA: Diagnosis not present

## 2016-12-21 DIAGNOSIS — I2582 Chronic total occlusion of coronary artery: Secondary | ICD-10-CM | POA: Diagnosis not present

## 2016-12-21 DIAGNOSIS — I200 Unstable angina: Secondary | ICD-10-CM | POA: Diagnosis present

## 2016-12-21 DIAGNOSIS — K219 Gastro-esophageal reflux disease without esophagitis: Secondary | ICD-10-CM | POA: Diagnosis not present

## 2016-12-21 DIAGNOSIS — E119 Type 2 diabetes mellitus without complications: Secondary | ICD-10-CM | POA: Insufficient documentation

## 2016-12-21 DIAGNOSIS — I2584 Coronary atherosclerosis due to calcified coronary lesion: Secondary | ICD-10-CM | POA: Insufficient documentation

## 2016-12-21 DIAGNOSIS — I2510 Atherosclerotic heart disease of native coronary artery without angina pectoris: Secondary | ICD-10-CM | POA: Diagnosis present

## 2016-12-21 HISTORY — PX: RIGHT/LEFT HEART CATH AND CORONARY ANGIOGRAPHY: CATH118266

## 2016-12-21 LAB — GLUCOSE, CAPILLARY: Glucose-Capillary: 154 mg/dL — ABNORMAL HIGH (ref 65–99)

## 2016-12-21 SURGERY — RIGHT/LEFT HEART CATH AND CORONARY ANGIOGRAPHY
Anesthesia: Moderate Sedation

## 2016-12-21 MED ORDER — SODIUM CHLORIDE 0.9 % WEIGHT BASED INFUSION: 3.0000 mL/kg/h | INTRAVENOUS | Status: DC

## 2016-12-21 MED ORDER — IOPAMIDOL (ISOVUE-300) INJECTION 61%
INTRAVENOUS | Status: DC | PRN
  Administered 2016-12-21: 105 mL via INTRA_ARTERIAL

## 2016-12-21 MED ORDER — SODIUM CHLORIDE 0.9 % WEIGHT BASED INFUSION
3.0000 mL/kg/h | INTRAVENOUS | Status: DC
  Administered 2016-12-21: 278 mL/h via INTRAVENOUS

## 2016-12-21 MED ORDER — SODIUM CHLORIDE 0.9 % WEIGHT BASED INFUSION
1.0000 mL/kg/h | INTRAVENOUS | Status: DC
  Administered 2016-12-21: 1 mL/kg/h via INTRAVENOUS

## 2016-12-21 MED ORDER — SODIUM CHLORIDE 0.9% FLUSH: 3.0000 mL | INTRAVENOUS | Status: DC | PRN

## 2016-12-21 MED ORDER — SODIUM CHLORIDE 0.9 % WEIGHT BASED INFUSION: 1.0000 mL/kg/h | INTRAVENOUS | Status: DC

## 2016-12-21 MED ORDER — MIDAZOLAM HCL 2 MG/2ML IJ SOLN
INTRAMUSCULAR | Status: DC | PRN
  Administered 2016-12-21: 1 mg via INTRAVENOUS

## 2016-12-21 MED ORDER — HEPARIN (PORCINE) IN NACL 2-0.9 UNIT/ML-% IJ SOLN
INTRAMUSCULAR | Status: AC
  Filled 2016-12-21: qty 1000

## 2016-12-21 MED ORDER — FENTANYL CITRATE (PF) 100 MCG/2ML IJ SOLN
INTRAMUSCULAR | Status: AC
  Filled 2016-12-21: qty 2

## 2016-12-21 MED ORDER — ASPIRIN 81 MG PO CHEW
CHEWABLE_TABLET | ORAL | Status: AC
  Filled 2016-12-21: qty 1

## 2016-12-21 MED ORDER — ASPIRIN 81 MG PO CHEW
81.0000 mg | CHEWABLE_TABLET | ORAL | Status: AC
  Administered 2016-12-21: 81 mg via ORAL

## 2016-12-21 MED ORDER — MIDAZOLAM HCL 2 MG/2ML IJ SOLN
INTRAMUSCULAR | Status: AC
  Filled 2016-12-21: qty 2

## 2016-12-21 MED ORDER — SODIUM CHLORIDE 0.9% FLUSH: 3.0000 mL | Freq: Two times a day (BID) | INTRAVENOUS | Status: DC

## 2016-12-21 MED ORDER — SODIUM CHLORIDE 0.9 % IV SOLN
250.0000 mL | INTRAVENOUS | Status: DC | PRN
Start: 2016-12-21 — End: 2016-12-21

## 2016-12-21 MED ORDER — FENTANYL CITRATE (PF) 100 MCG/2ML IJ SOLN
INTRAMUSCULAR | Status: DC | PRN
  Administered 2016-12-21: 25 ug via INTRAVENOUS

## 2016-12-21 SURGICAL SUPPLY — 13 items
CATH 5FR JR4 DIAGNOSTIC (CATHETERS) ×2 IMPLANT
CATH INFINITI 5FR ANG PIGTAIL (CATHETERS) ×2 IMPLANT
CATH INFINITI 5FR JL4 (CATHETERS) ×2 IMPLANT
CATH SWANZ 7F THERMO (CATHETERS) ×2 IMPLANT
DEVICE CLOSURE MYNXGRIP 5F (Vascular Products) ×2 IMPLANT
GUIDEWIRE EMER 3M J .025X150CM (WIRE) ×2 IMPLANT
KIT MANI 3VAL PERCEP (MISCELLANEOUS) ×2 IMPLANT
KIT RIGHT HEART (MISCELLANEOUS) ×4 IMPLANT
NEEDLE PERC 18GX7CM (NEEDLE) ×2 IMPLANT
PACK CARDIAC CATH (CUSTOM PROCEDURE TRAY) ×2 IMPLANT
SHEATH AVANTI 5FR X 11CM (SHEATH) ×2 IMPLANT
SHEATH PINNACLE 7F 10CM (SHEATH) ×2 IMPLANT
WIRE EMERALD 3MM-J .035X150CM (WIRE) ×2 IMPLANT

## 2016-12-21 NOTE — Progress Notes (Signed)
Patient remains clinically stable post heart cath, family at bedside.sinus rhythm per monitor,no bleeding nor hematoma at right groin site. Dr Saralyn Pilar out to speak with patient./family with questions answered. Discharge instructions given with questions answered.denies complaints.

## 2016-12-21 NOTE — Discharge Instructions (Signed)
Groin Insertion Instructions-If you lose feeling or develop tingling or pain in your leg or foot after the procedure, please walk around first.  If the discomfort does not improve , contact your physician and proceed to the nearest emergency room.  Loss of feeling in your leg might mean that a blockage has formed in the artery and this can be appropriately treated.  Limit your activity for the next two days after your procedure.  Avoid stooping, bending, heavy lifting or exertion as this may put pressure on the insertion site.  Resume normal activities in 48 hours.  You may shower after 24 hours but avoid excessive warm water and do not scrub the site.  Remove clear dressing in 48 hours.  If you have had a closure device inserted, do not soak in a tub bath or a hot tub for at least one week. ° °No driving for 48 hours after discharge.  After the procedure, check the insertion site occasionally.  If any oozing occurs or there is apparent swelling, firm pressure over the site will prevent a bruise from forming.  You can not hurt anything by pressing directly on the site.  The pressure stops the bleeding by allowing a small clot to form.  If the bleeding continues after the pressure has been applied for more than 15 minutes, call 911 or go to the nearest emergency room.   ° °The x-ray dye causes you to pass a considerate amount of urine.  For this reason, you will be asked to drink plenty of liquids after the procedure to prevent dehydration.  You may resume you regular diet.  Avoid caffeine products.   ° °For pain at the site of your procedure, take non-aspirin medicines such as Tylenol. ° °Medications: A. Hold Metformin for 48 hours if applicable.  B. Continue taking all your present medications at home unless your doctor prescribes any changes. ° °Moderate Conscious Sedation, Adult, Care After °These instructions provide you with information about caring for yourself after your procedure. Your health care provider  may also give you more specific instructions. Your treatment has been planned according to current medical practices, but problems sometimes occur. Call your health care provider if you have any problems or questions after your procedure. °What can I expect after the procedure? °After your procedure, it is common: °· To feel sleepy for several hours. °· To feel clumsy and have poor balance for several hours. °· To have poor judgment for several hours. °· To vomit if you eat too soon. ° °Follow these instructions at home: °For at least 24 hours after the procedure: ° °· Do not: °? Participate in activities where you could fall or become injured. °? Drive. °? Use heavy machinery. °? Drink alcohol. °? Take sleeping pills or medicines that cause drowsiness. °? Make important decisions or sign legal documents. °? Take care of children on your own. °· Rest. °Eating and drinking °· Follow the diet recommended by your health care provider. °· If you vomit: °? Drink water, juice, or soup when you can drink without vomiting. °? Make sure you have little or no nausea before eating solid foods. °General instructions °· Have a responsible adult stay with you until you are awake and alert. °· Take over-the-counter and prescription medicines only as told by your health care provider. °· If you smoke, do not smoke without supervision. °· Keep all follow-up visits as told by your health care provider. This is important. °Contact a health care provider   if: °· You keep feeling nauseous or you keep vomiting. °· You feel light-headed. °· You develop a rash. °· You have a fever. °Get help right away if: °· You have trouble breathing. °This information is not intended to replace advice given to you by your health care provider. Make sure you discuss any questions you have with your health care provider. °Document Released: 03/05/2013 Document Revised: 10/18/2015 Document Reviewed: 09/04/2015 °Elsevier Interactive Patient Education © 2018  [employer] ° °

## 2016-12-28 ENCOUNTER — Other Ambulatory Visit: Payer: Self-pay

## 2016-12-28 DIAGNOSIS — R109 Unspecified abdominal pain: Secondary | ICD-10-CM

## 2016-12-28 DIAGNOSIS — Z87442 Personal history of urinary calculi: Secondary | ICD-10-CM

## 2016-12-29 ENCOUNTER — Encounter: Payer: Self-pay | Admitting: Urology

## 2016-12-29 ENCOUNTER — Ambulatory Visit
Admission: RE | Admit: 2016-12-29 | Discharge: 2016-12-29 | Disposition: A | Discharge status: Home / Self Care | Payer: Medicare HMO | Source: Ambulatory Visit | Attending: Urology | Admitting: Urology

## 2016-12-29 ENCOUNTER — Other Ambulatory Visit
Admission: RE | Admit: 2016-12-29 | Discharge: 2016-12-29 | Disposition: A | Discharge status: Home / Self Care | Payer: Medicare HMO | Source: Ambulatory Visit | Attending: Urology | Admitting: Urology

## 2016-12-29 ENCOUNTER — Ambulatory Visit (INDEPENDENT_AMBULATORY_CARE_PROVIDER_SITE_OTHER): Payer: Medicare HMO | Admitting: Urology

## 2016-12-29 VITALS — BP 122/9 | HR 76 | Ht 70.0 in | Wt 212.2 lb

## 2016-12-29 DIAGNOSIS — Z87442 Personal history of urinary calculi: Secondary | ICD-10-CM | POA: Insufficient documentation

## 2016-12-29 DIAGNOSIS — R109 Unspecified abdominal pain: Secondary | ICD-10-CM | POA: Insufficient documentation

## 2016-12-29 DIAGNOSIS — N281 Cyst of kidney, acquired: Secondary | ICD-10-CM | POA: Diagnosis not present

## 2016-12-29 DIAGNOSIS — N3289 Other specified disorders of bladder: Secondary | ICD-10-CM

## 2016-12-29 LAB — URINALYSIS, COMPLETE (UACMP) WITH MICROSCOPIC
Bacteria, UA: NONE SEEN
Bilirubin Urine: NEGATIVE
Glucose, UA: NEGATIVE mg/dL
Hgb urine dipstick: NEGATIVE
Ketones, ur: NEGATIVE mg/dL
Leukocytes, UA: NEGATIVE
Nitrite: NEGATIVE
Protein, ur: NEGATIVE mg/dL
Specific Gravity, Urine: 1.025 (ref 1.005–1.030)
pH: 5 (ref 5.0–8.0)

## 2016-12-29 NOTE — Progress Notes (Signed)
12/29/2016 4:34 PM   Dustin Blackburn 1940/09/23 [id number]  Referring provider: Ricardo Jericho, NP [address] [place], [place] [address]  Chief Complaint  Patient presents with  . Flank Pain    patient requested this appointment    HPI:  76 year old male with bilateral renal cysts, history of kidney stones, and personal history of bladder cancer who presents today complaining of right flank pain. He reports this started a few months ago. It happens primarily when he sits up in bed in a sharp but subsides quickly. He denies any flank pain throughout the day. No significant urinary symptoms including no dysuria or gross hematuria. No associated nausea or vomiting. No fevers or chills.  KUB today shows no obvious stone burden. Urinalysis is negative.  He does have a history of nephrolithiasis, previously followed by Dr. Jacqlyn Larsen. No stone seen on most recent scan. He does complain of some chronic right low back pain.  He also brought a CT scan with him today, CT of the chest with contrast performed on 12/01/2016 at [facility].  In the report, they did mention presence of renal cysts and wanted this to be compared to his previous scan, CT abdomen pelvis from 11 2017.  He was initially found to have incidental bladder wall thickening on CT abdomen pelvis on 03/2016. Cystoscopy showed a papillary proximal and 1 cm tumor at the dome of the bladder. He was taken to the operating room on 06/12/2016 for TURBT, bilateral retrograde, and mitomycin.  Surveillance cystoscopy on 09/23/2016 was negative.   PMH: Past Medical History:  Diagnosis Date  . Arthritis   . Asthma   . Cold   . COPD (chronic obstructive pulmonary disease) ([place])   . Diabetes mellitus without complication ([place])   . Dyspnea   . GERD (gastroesophageal reflux disease)   . Heart attack ([place]) 2016  . Kidney cysts   . Kidney stones   . Psoriasis     Surgical History: Past Surgical History:  Procedure Laterality Date   . CORONARY STENT PLACEMENT  2016  . CYSTOSCOPY W/ RETROGRADES Bilateral 06/12/2016   Procedure: CYSTOSCOPY WITH RETROGRADE PYELOGRAM;  Surgeon: Hollice Espy, MD;  Location: [facility];  Service: Urology;  Laterality: Bilateral;  . fatty tumors removed    . HIATAL HERNIA REPAIR  1992  . LIPOMA EXCISION    . RIGHT/LEFT HEART CATH AND CORONARY ANGIOGRAPHY N/A 12/21/2016   Procedure: Right/Left Heart Cath and Coronary Angiography;  Surgeon: Isaias Cowman, MD;  Location: [facility];  Service: Cardiovascular;  Laterality: N/A;  . SINUS EXPLORATION    . TRANSURETHRAL RESECTION OF BLADDER TUMOR WITH MITOMYCIN-C N/A 06/12/2016   Procedure: TRANSURETHRAL RESECTION OF BLADDER TUMOR WITH MITOMYCIN-C;  Surgeon: Hollice Espy, MD;  Location: [facility];  Service: Urology;  Laterality: N/A;  . UMBILICAL HERNIA REPAIR N/A 06/12/2016   Procedure: HERNIA REPAIR UMBILICAL ADULT;  Surgeon: Christene Lye, MD;  Location: [facility];  Service: General;  Laterality: N/A;  . URETEROSCOPY Right 06/12/2016   Procedure: URETEROSCOPY;  Surgeon: Hollice Espy, MD;  Location: [facility];  Service: Urology;  Laterality: Right;    Home Medications:  Allergies as of 12/29/2016      Reactions   Actos [pioglitazone] Palpitations   Pt reported a MI    Glimepiride Rash   Other reaction(s): RASH   Metformin Shortness Of Breath   Pt states he "can't breathe and it gives him sinus congestion". Other reaction(s): OTHER   Amoxicillin  Other reaction(s): NAUSEA   Erythromycin Base Nausea Only   Other reaction(s): OTHER   Esomeprazole Magnesium Other (See Comments)   Other reaction(s): Other (See Comments) Makes patients heart burn worse Makes patients heart burn worse   Liraglutide Other (See Comments)   bloating Other reaction(s): Other (See Comments) bloating   Penicillins Nausea Only   Has patient had a PCN reaction causing immediate rash, facial/tongue/throat swelling, SOB or lightheadedness with  hypotension: no Has patient had a PCN reaction causing severe rash involving mucus membranes or skin necrosis: no Has patient had a PCN reaction that required hospitalization:no Has patient had a PCN reaction occurring within the last 10 years: no If all of the above answers are "NO", then may proceed with Cephalosporin use.   Sitagliptin Other (See Comments)   Pt states it "gives him joint pain" & redness Other reaction(s): JOINT PAIN      Medication List       Accurate as of 12/29/16  4:34 PM. Always use your most recent med list.          albuterol (2.5 MG/3ML) 0.083% nebulizer solution Commonly known as:  PROVENTIL Take 2.5 mg by nebulization every 4 (four) hours as needed.   VENTOLIN HFA 108 (90 Base) MCG/ACT inhaler Generic drug:  albuterol Take 108 mcg by mouth daily as needed for shortness of breath.   aspirin EC 325 MG tablet Take 325 mg by mouth daily.   docusate sodium 100 MG capsule Commonly known as:  COLACE Take 1 capsule (100 mg total) by mouth 2 (two) times daily.   montelukast 10 MG tablet Commonly known as:  SINGULAIR Take 10 mg by mouth at bedtime.   naproxen 500 MG tablet Commonly known as:  NAPROSYN Take 500 mg by mouth daily as needed for pain.   Testosterone Cypionate Powd 7 Doses by Does not apply route at bedtime. 7 drops   triamcinolone cream 0.1 % Commonly known as:  KENALOG Apply 1 application topically 2 (two) times daily as needed for itching.       Allergies:  Allergies  Allergen Reactions  . Actos [Pioglitazone] Palpitations    Pt reported a MI   . Glimepiride Rash    Other reaction(s): RASH  . Metformin Shortness Of Breath    Pt states he "can't breathe and it gives him sinus congestion". Other reaction(s): OTHER  . Amoxicillin     Other reaction(s): NAUSEA  . Erythromycin Base Nausea Only    Other reaction(s): OTHER  . Esomeprazole Magnesium Other (See Comments)    Other reaction(s): Other (See Comments) Makes patients  heart burn worse Makes patients heart burn worse  . Liraglutide Other (See Comments)    bloating Other reaction(s): Other (See Comments) bloating  . Penicillins Nausea Only    Has patient had a PCN reaction causing immediate rash, facial/tongue/throat swelling, SOB or lightheadedness with hypotension: no Has patient had a PCN reaction causing severe rash involving mucus membranes or skin necrosis: no Has patient had a PCN reaction that required hospitalization:no Has patient had a PCN reaction occurring within the last 10 years: no If all of the above answers are "NO", then may proceed with Cephalosporin use.   . Sitagliptin Other (See Comments)    Pt states it "gives him joint pain" & redness Other reaction(s): JOINT PAIN    Family History: Family History  Problem Relation Age of Onset  . Cancer Mother   . Prostate cancer Maternal Uncle   . Prostate cancer  Paternal Uncle   . Prostate cancer Cousin   . Kidney cancer Neg Hx   . Bladder Cancer Neg Hx     Social History:  reports that he quit smoking about 26 years ago. His smoking use included Cigarettes. He quit after 30.00 years of use. He has never used smokeless tobacco. He reports that he drinks alcohol. He reports that he does not use drugs.  ROS: UROLOGY Frequent Urination?: No Hard to postpone urination?: No Burning/pain with urination?: No Get up at night to urinate?: No Leakage of urine?: No Urine stream starts and stops?: No Trouble starting stream?: No Do you have to strain to urinate?: No Blood in urine?: No Urinary tract infection?: No Sexually transmitted disease?: No Injury to kidneys or bladder?: No Painful intercourse?: No Weak stream?: No Erection problems?: No Penile pain?: No  Gastrointestinal Nausea?: No Vomiting?: No Indigestion/heartburn?: No Diarrhea?: No Constipation?: No  Constitutional Fever: No Night sweats?: No Weight loss?: No Fatigue?: Yes  Skin Skin rash/lesions?:  No Itching?: No  Eyes Blurred vision?: No Double vision?: No  Ears/Nose/Throat Sore throat?: No Sinus problems?: Yes  Hematologic/Lymphatic Swollen glands?: No Easy bruising?: No  Cardiovascular Leg swelling?: No Chest pain?: No  Respiratory Cough?: Yes Shortness of breath?: Yes  Endocrine Excessive thirst?: No  Musculoskeletal Back pain?: Yes Joint pain?: No  Neurological Headaches?: No Dizziness?: Yes  Psychologic Depression?: No Anxiety?: No  Physical Exam: BP (!) 122/9   Pulse 76   Ht 5\' 10"  (1.778 m)   Wt 212 lb 4 oz (96.3 kg)   BMI 30.45 kg/m   Constitutional:  Alert and oriented, No acute distress. HEENT: [place] AT, moist mucus membranes.  Trachea midline, no masses. Cardiovascular: No clubbing, cyanosis, or edema. Respiratory: Normal respiratory effort, no increased work of breathing. GI: Abdomen is soft, nontender, nondistended, no abdominal masses GU: Tenderness in the left lower paraspinous muscles, reproducible with deep palpation. No CVA tenderness bilaterally. Skin: No rashes, bruises or suspicious lesions. Neurologic: Grossly intact, no focal deficits, moving all 4 extremities. Psychiatric: Normal mood and affect.  Laboratory Data: Lab Results  Component Value Date   WBC 10.5 06/02/2016   HGB 15.8 06/02/2016   HCT 46.5 06/02/2016   MCV 91.4 06/02/2016   PLT 260 06/02/2016    Lab Results  Component Value Date   CREATININE 0.80 06/02/2016   Urinalysis    Component Value Date/Time   COLORURINE YELLOW 12/29/2016 1538   APPEARANCEUR CLEAR 12/29/2016 1538   LABSPEC 1.025 12/29/2016 1538   PHURINE 5.0 12/29/2016 1538   GLUCOSEU NEGATIVE 12/29/2016 1538   HGBUR NEGATIVE 12/29/2016 1538   BILIRUBINUR NEGATIVE 12/29/2016 1538   KETONESUR NEGATIVE 12/29/2016 1538   PROTEINUR NEGATIVE 12/29/2016 1538   NITRITE NEGATIVE 12/29/2016 1538   LEUKOCYTESUR NEGATIVE 12/29/2016 1538    Pertinent Imaging: CLINICAL DATA:  Acute right flank  pain.  EXAM: ABDOMEN - 1 VIEW  COMPARISON:  Radiographs of Oct 10, 2007. CT scan of April 19, 2016.  FINDINGS: The bowel gas pattern is normal.  Phlebolith is noted in the pelvis.  IMPRESSION: No evidence of bowel obstruction or ileus.   Electronically Signed   By: Marijo Conception, M.D.   On: 12/29/2016 15:39   KUB was personally reviewed today.  In addition, his previous CT abdomen and pelvis from 03/2016 was reviewed as well as the disc from his CT of the chest confirmed [facility] in 11/2016. Bilateral noncomplex renal cysts are visualized.  Assessment & Plan:  1. Right flank pain Based on the nature of the pain, reproducible with palpation of paraspinous muscles, and absence of stone on KUB and UA, I do not suspect stones to be the etiology of his pain  Voiding symptoms are reviewed as well as other signs or symptoms of nephrolithiasis  Heat, massage, and NSAIDs are recommended as tolerated  2. History of kidney stones As above, no obvious stones on KUB today  3. Bladder mass Due for cystocopy in 2 months  4. Renal cyst Multiple bilateral renal cysts, nonpathologic Reviewed CT of the chest from [facility], no change or worrisome findings   Follow as scheduled  Hollice Espy, MD  [facility] [address] [place], [place] [address] [phone]  I spent 25 min with this patient of which greater than 50% was spent in counseling and coordination of care with the patient.

## 2017-03-22 ENCOUNTER — Other Ambulatory Visit: Payer: Self-pay

## 2017-03-22 DIAGNOSIS — N3289 Other specified disorders of bladder: Secondary | ICD-10-CM

## 2017-03-23 ENCOUNTER — Ambulatory Visit (INDEPENDENT_AMBULATORY_CARE_PROVIDER_SITE_OTHER): Payer: Medicare HMO | Admitting: Urology

## 2017-03-23 ENCOUNTER — Other Ambulatory Visit
Admission: RE | Admit: 2017-03-23 | Discharge: 2017-03-23 | Disposition: A | Discharge status: Home / Self Care | Payer: Medicare HMO | Source: Ambulatory Visit | Attending: Urology | Admitting: Urology

## 2017-03-23 ENCOUNTER — Other Ambulatory Visit: Payer: Self-pay

## 2017-03-23 ENCOUNTER — Encounter: Payer: Self-pay | Admitting: Urology

## 2017-03-23 VITALS — BP 145/80 | HR 76 | Ht 70.0 in | Wt 212.0 lb

## 2017-03-23 DIAGNOSIS — N3289 Other specified disorders of bladder: Secondary | ICD-10-CM | POA: Diagnosis present

## 2017-03-23 DIAGNOSIS — C679 Malignant neoplasm of bladder, unspecified: Secondary | ICD-10-CM | POA: Diagnosis not present

## 2017-03-23 LAB — URINALYSIS, COMPLETE (UACMP) WITH MICROSCOPIC
Bacteria, UA: NONE SEEN
Bilirubin Urine: NEGATIVE
Glucose, UA: 100 mg/dL — AB
Leukocytes, UA: NEGATIVE
Nitrite: NEGATIVE
Protein, ur: NEGATIVE mg/dL
Specific Gravity, Urine: 1.02 (ref 1.005–1.030)
pH: 5 (ref 5.0–8.0)

## 2017-03-23 MED ORDER — CIPROFLOXACIN HCL 500 MG PO TABS
500.0000 mg | ORAL_TABLET | Freq: Once | ORAL | Status: AC
  Administered 2017-03-23: 500 mg via ORAL

## 2017-03-23 NOTE — Progress Notes (Signed)
   03/23/17  CC:  Chief Complaint  Patient presents with  . Cysto    HPI: 76 year old male with a history of low-grade noninvasive bladder cancer returns today for office cystoscopy.  He was initially found to have incidental bladder wall thickening on CT abdomen pelvis on 03/2016. Cystoscopy showed a papillary proximal and 1 cm tumor at the dome of the bladder. He was taken to the operating room on 06/12/2016 for TURBT, bilateral retrograde, and insufflation mitomycin.  Surgical pathology was consistent with low-grade, noninvasive tumor.  Cystoscopy 09/15/2016 without evidence of recurrence.  Component     Latest Ref Rng & Units 03/23/2017  Color, Urine     YELLOW YELLOW  Appearance     CLEAR CLEAR  Specific Gravity, Urine     1.005 - 1.030 1.020  pH     5.0 - 8.0 5.0  Glucose     NEGATIVE mg/dL 100 (A)  Hgb urine dipstick     NEGATIVE TRACE (A)  Bilirubin Urine     NEGATIVE NEGATIVE  Ketones, ur     NEGATIVE mg/dL TRACE (A)  Protein     NEGATIVE mg/dL NEGATIVE  Nitrite     NEGATIVE NEGATIVE  Leukocytes, UA     NEGATIVE NEGATIVE  Squamous Epithelial / LPF     NONE SEEN 0-5 (A)  WBC, UA     0 - 5 WBC/hpf 0-5  RBC / HPF     0 - 5 RBC/hpf 0-5  Bacteria, UA     NONE SEEN NONE SEEN   Blood pressure (!) 145/80, pulse 76, height 5\' 10"  (1.778 m), weight 212 lb (96.2 kg). NED. A&Ox3.   No respiratory distress   Abd soft, NT, ND Normal phallus with bilateral descended testicles  Cystoscopy Procedure Note  Patient identification was confirmed, informed consent was obtained, and patient was prepped using Betadine solution.  Lidocaine jelly was administered per urethral meatus.    Preoperative abx where received prior to procedure.     Pre-Procedure: - Inspection reveals a normal caliber ureteral meatus.  Procedure: The flexible cystoscope was introduced without difficulty - No urethral strictures/lesions are present. - Enlarged prostate with trilobar  coaptation, friable prostatic mucosa - Normal bladder neck - Bilateral ureteral orifices identified - Bladder mucosa  reveals no ulcers, tumors, or lesions.   - No bladder stones - No trabeculation  Retroflexion unremarkable  Post-Procedure: - Patient tolerated the procedure well  Assessment/ Plan:   1. 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 - lidocaine (XYLOCAINE) 2 % jelly 1 application; Place 1 application into the urethra once.  Return in about 6 months (around 09/21/2017) for cysto.   Hollice Espy, MD

## 2017-06-22 ENCOUNTER — Ambulatory Visit: Admit: 2017-06-22 | Discharge: 2017-06-23 | Payer: MEDICARE | Attending: Internal Medicine | Primary: Internal Medicine

## 2017-06-22 DIAGNOSIS — E236 Other disorders of pituitary gland: Principal | ICD-10-CM

## 2017-06-22 DIAGNOSIS — E291 Testicular hypofunction: Secondary | ICD-10-CM

## 2017-07-13 ENCOUNTER — Ambulatory Visit: Admit: 2017-07-13 | Discharge: 2017-07-14 | Payer: MEDICARE | Attending: Internal Medicine | Primary: Internal Medicine

## 2017-07-13 DIAGNOSIS — E236 Other disorders of pituitary gland: Secondary | ICD-10-CM

## 2017-07-13 DIAGNOSIS — N500 Atrophy of testis: Principal | ICD-10-CM

## 2017-07-13 DIAGNOSIS — R5383 Other fatigue: Secondary | ICD-10-CM

## 2017-07-20 MED ORDER — TESTOSTERONE 1 % (50 MG/5 GRAM) TRANSDERMAL GEL PACKET
Freq: Every day | TRANSDERMAL | 2 refills | 0.00000 days | Status: CP
Start: 2017-07-20 — End: 2017-10-23

## 2017-09-14 ENCOUNTER — Ambulatory Visit: Admit: 2017-09-14 | Discharge: 2017-09-14 | Payer: MEDICARE | Attending: Audiologist | Primary: Audiologist

## 2017-09-14 ENCOUNTER — Ambulatory Visit: Admit: 2017-09-14 | Discharge: 2017-09-14 | Payer: MEDICARE

## 2017-09-14 ENCOUNTER — Institutional Professional Consult (permissible substitution): Admit: 2017-09-14 | Discharge: 2017-09-14 | Payer: MEDICARE

## 2017-09-14 DIAGNOSIS — H6980 Other specified disorders of Eustachian tube, unspecified ear: Principal | ICD-10-CM

## 2017-09-14 DIAGNOSIS — H6993 Unspecified Eustachian tube disorder, bilateral: Principal | ICD-10-CM

## 2017-09-14 DIAGNOSIS — H903 Sensorineural hearing loss, bilateral: Secondary | ICD-10-CM

## 2017-09-14 DIAGNOSIS — H9193 Unspecified hearing loss, bilateral: Principal | ICD-10-CM

## 2017-09-17 ENCOUNTER — Encounter: Payer: Self-pay | Admitting: *Deleted

## 2017-09-18 ENCOUNTER — Ambulatory Visit
Admission: RE | Admit: 2017-09-18 | Discharge: 2017-09-18 | Disposition: A | Discharge status: Home / Self Care | Payer: Medicare HMO | Source: Ambulatory Visit | Attending: Gastroenterology | Admitting: Gastroenterology

## 2017-09-18 ENCOUNTER — Ambulatory Visit: Payer: Medicare HMO | Admitting: Anesthesiology

## 2017-09-18 ENCOUNTER — Encounter: Payer: Self-pay | Admitting: *Deleted

## 2017-09-18 ENCOUNTER — Encounter
Admission: RE | Disposition: A | Discharge status: Home / Self Care | Payer: Self-pay | Source: Ambulatory Visit | Attending: Gastroenterology

## 2017-09-18 DIAGNOSIS — I252 Old myocardial infarction: Secondary | ICD-10-CM | POA: Insufficient documentation

## 2017-09-18 DIAGNOSIS — Z1211 Encounter for screening for malignant neoplasm of colon: Secondary | ICD-10-CM | POA: Insufficient documentation

## 2017-09-18 DIAGNOSIS — E119 Type 2 diabetes mellitus without complications: Secondary | ICD-10-CM | POA: Diagnosis not present

## 2017-09-18 DIAGNOSIS — Z791 Long term (current) use of non-steroidal anti-inflammatories (NSAID): Secondary | ICD-10-CM | POA: Diagnosis not present

## 2017-09-18 DIAGNOSIS — J449 Chronic obstructive pulmonary disease, unspecified: Secondary | ICD-10-CM | POA: Insufficient documentation

## 2017-09-18 DIAGNOSIS — Z7982 Long term (current) use of aspirin: Secondary | ICD-10-CM | POA: Insufficient documentation

## 2017-09-18 DIAGNOSIS — K210 Gastro-esophageal reflux disease with esophagitis: Secondary | ICD-10-CM | POA: Insufficient documentation

## 2017-09-18 DIAGNOSIS — K2950 Unspecified chronic gastritis without bleeding: Secondary | ICD-10-CM | POA: Diagnosis not present

## 2017-09-18 DIAGNOSIS — Z7951 Long term (current) use of inhaled steroids: Secondary | ICD-10-CM | POA: Insufficient documentation

## 2017-09-18 DIAGNOSIS — Z888 Allergy status to other drugs, medicaments and biological substances status: Secondary | ICD-10-CM | POA: Insufficient documentation

## 2017-09-18 DIAGNOSIS — Z79899 Other long term (current) drug therapy: Secondary | ICD-10-CM | POA: Insufficient documentation

## 2017-09-18 DIAGNOSIS — K2980 Duodenitis without bleeding: Secondary | ICD-10-CM | POA: Insufficient documentation

## 2017-09-18 DIAGNOSIS — N400 Enlarged prostate without lower urinary tract symptoms: Secondary | ICD-10-CM | POA: Insufficient documentation

## 2017-09-18 DIAGNOSIS — Z880 Allergy status to penicillin: Secondary | ICD-10-CM | POA: Insufficient documentation

## 2017-09-18 DIAGNOSIS — R110 Nausea: Secondary | ICD-10-CM | POA: Diagnosis not present

## 2017-09-18 DIAGNOSIS — I2510 Atherosclerotic heart disease of native coronary artery without angina pectoris: Secondary | ICD-10-CM | POA: Diagnosis not present

## 2017-09-18 DIAGNOSIS — L83 Acanthosis nigricans: Secondary | ICD-10-CM | POA: Diagnosis not present

## 2017-09-18 DIAGNOSIS — K640 First degree hemorrhoids: Secondary | ICD-10-CM | POA: Diagnosis not present

## 2017-09-18 DIAGNOSIS — K5790 Diverticulosis of intestine, part unspecified, without perforation or abscess without bleeding: Secondary | ICD-10-CM | POA: Insufficient documentation

## 2017-09-18 DIAGNOSIS — Z87891 Personal history of nicotine dependence: Secondary | ICD-10-CM | POA: Insufficient documentation

## 2017-09-18 DIAGNOSIS — R140 Abdominal distension (gaseous): Secondary | ICD-10-CM | POA: Insufficient documentation

## 2017-09-18 DIAGNOSIS — Z7984 Long term (current) use of oral hypoglycemic drugs: Secondary | ICD-10-CM | POA: Diagnosis not present

## 2017-09-18 DIAGNOSIS — D123 Benign neoplasm of transverse colon: Secondary | ICD-10-CM | POA: Insufficient documentation

## 2017-09-18 DIAGNOSIS — K225 Diverticulum of esophagus, acquired: Secondary | ICD-10-CM | POA: Insufficient documentation

## 2017-09-18 DIAGNOSIS — R1013 Epigastric pain: Secondary | ICD-10-CM | POA: Insufficient documentation

## 2017-09-18 DIAGNOSIS — Z881 Allergy status to other antibiotic agents status: Secondary | ICD-10-CM | POA: Insufficient documentation

## 2017-09-18 DIAGNOSIS — K2210 Ulcer of esophagus without bleeding: Secondary | ICD-10-CM | POA: Diagnosis not present

## 2017-09-18 HISTORY — DX: Personal history of other diseases of the respiratory system: Z87.09

## 2017-09-18 HISTORY — DX: Unspecified hearing loss, bilateral: H91.93

## 2017-09-18 HISTORY — PX: ESOPHAGOGASTRODUODENOSCOPY (EGD) WITH PROPOFOL: SHX5813

## 2017-09-18 HISTORY — PX: COLONOSCOPY WITH PROPOFOL: SHX5780

## 2017-09-18 LAB — KOH PREP
KOH Prep: NONE SEEN
Special Requests: NORMAL

## 2017-09-18 LAB — GLUCOSE, CAPILLARY: Glucose-Capillary: 118 mg/dL — ABNORMAL HIGH (ref 65–99)

## 2017-09-18 SURGERY — COLONOSCOPY WITH PROPOFOL
Anesthesia: General

## 2017-09-18 MED ORDER — PROPOFOL 500 MG/50ML IV EMUL
INTRAVENOUS | Status: AC
  Filled 2017-09-18: qty 50

## 2017-09-18 MED ORDER — SODIUM CHLORIDE 0.9 % IV SOLN
INTRAVENOUS | Status: DC
  Administered 2017-09-18: 08:00:00 via INTRAVENOUS

## 2017-09-18 MED ORDER — PROPOFOL 10 MG/ML IV BOLUS
INTRAVENOUS | Status: AC
  Filled 2017-09-18: qty 20

## 2017-09-18 MED ORDER — PHENYLEPHRINE HCL 10 MG/ML IJ SOLN
INTRAMUSCULAR | Status: DC | PRN
  Administered 2017-09-18 (×3): 100 ug via INTRAVENOUS

## 2017-09-18 MED ORDER — LIDOCAINE 2% (20 MG/ML) 5 ML SYRINGE
INTRAMUSCULAR | Status: DC | PRN
  Administered 2017-09-18: 30 mg via INTRAVENOUS

## 2017-09-18 MED ORDER — FENTANYL CITRATE (PF) 100 MCG/2ML IJ SOLN
INTRAMUSCULAR | Status: DC | PRN
  Administered 2017-09-18 (×2): 50 ug via INTRAVENOUS

## 2017-09-18 MED ORDER — FENTANYL CITRATE (PF) 100 MCG/2ML IJ SOLN
INTRAMUSCULAR | Status: AC
  Filled 2017-09-18: qty 2

## 2017-09-18 MED ORDER — PROPOFOL 500 MG/50ML IV EMUL
INTRAVENOUS | Status: DC | PRN
  Administered 2017-09-18: 140 ug/kg/min via INTRAVENOUS

## 2017-09-18 MED ORDER — PROPOFOL 10 MG/ML IV BOLUS
INTRAVENOUS | Status: DC | PRN
  Administered 2017-09-18: 100 mg via INTRAVENOUS

## 2017-09-18 MED ORDER — EPHEDRINE SULFATE 50 MG/ML IJ SOLN
INTRAMUSCULAR | Status: DC | PRN
  Administered 2017-09-18: 5 mg via INTRAVENOUS
  Administered 2017-09-18: 10 mg via INTRAVENOUS

## 2017-09-18 MED ORDER — LIDOCAINE HCL (PF) 2 % IJ SOLN
INTRAMUSCULAR | Status: AC
  Filled 2017-09-18: qty 10

## 2017-09-18 NOTE — Anesthesia Postprocedure Evaluation (Signed)
Anesthesia Post Note  Patient: LATREL SZYMCZAK  Procedure(s) Performed: COLONOSCOPY WITH PROPOFOL (N/A ) ESOPHAGOGASTRODUODENOSCOPY (EGD) WITH PROPOFOL (N/A )  Patient location during evaluation: Endoscopy Anesthesia Type: General Level of consciousness: awake and alert and oriented Pain management: pain level controlled Vital Signs Assessment: post-procedure vital signs reviewed and stable Respiratory status: spontaneous breathing, nonlabored ventilation and respiratory function stable Cardiovascular status: blood pressure returned to baseline and stable Postop Assessment: no signs of nausea or vomiting Anesthetic complications: no     Last Vitals:  Vitals:   09/18/17 0930 09/18/17 0940  BP: 114/68 116/66  Pulse: 71 65  Resp: 14 15  Temp: (!) 36.1 C   SpO2: 100% 97%    Last Pain:  Vitals:   09/18/17 0920  TempSrc: Tympanic                 Buena Boehm

## 2017-09-18 NOTE — Transfer of Care (Signed)
Immediate Anesthesia Transfer of Care Note  Patient: TOWNSEND CUDWORTH  Procedure(s) Performed: COLONOSCOPY WITH PROPOFOL (N/A ) ESOPHAGOGASTRODUODENOSCOPY (EGD) WITH PROPOFOL (N/A )  Patient Location: PACU and Endoscopy Unit  Anesthesia Type:General  Level of Consciousness: awake, drowsy and patient cooperative  Airway & Oxygen Therapy: Patient Spontanous Breathing and Patient connected to nasal cannula oxygen  Post-op Assessment: Report given to RN and Post -op Vital signs reviewed and stable  Post vital signs: Reviewed and stable  Last Vitals:  Vitals Value Taken Time  BP 107/65 09/18/2017  9:26 AM  Temp 35.8 C 09/18/2017  9:20 AM  Pulse 68 09/18/2017  9:28 AM  Resp 11 09/18/2017  9:28 AM  SpO2 100 % 09/18/2017  9:28 AM  Vitals shown include unvalidated device data.  Last Pain:  Vitals:   09/18/17 0920  TempSrc: Tympanic         Complications: No apparent anesthesia complications

## 2017-09-18 NOTE — Anesthesia Preprocedure Evaluation (Addendum)
Anesthesia Evaluation  Patient identified by MRN, date of birth, ID band Patient awake    Reviewed: Allergy & Precautions, NPO status , Patient's Chart, lab work & pertinent test results  History of Anesthesia Complications Negative for: history of anesthetic complications  Airway Mallampati: III  TM Distance: <3 FB Neck ROM: Full    Dental  (+) Upper Dentures   Pulmonary asthma , COPD (just finished 1 week course of prednisone and doxy for increased cough),  COPD inhaler, former smoker,    breath sounds clear to auscultation- rhonchi (-) wheezing      Cardiovascular + CAD, + Past MI and + Cardiac Stents (2016)   Rhythm:Regular Rate:Normal - Systolic murmurs and - Diastolic murmurs L heart cath 12/21/16:  Prox Cx lesion, 60 %stenosed.  2nd Mrg lesion, 50 %stenosed.  Ost LAD to Prox LAD lesion, 30 %stenosed.  Mid RCA to Dist RCA lesion, 0 %stenosed.  Prox RCA lesion, 20 %stenosed.  Mid RCA lesion, 30 %stenosed.   1. Insignificant coronary artery disease with patent stent distal RCA 2. Mildly reduced left ventricular function with posterior wall akinesis 3. Near normal right sided heart pressures with mean pulmonary arterial pressure 21 mmHg not diagnostic for pulmonary hypertension    Neuro/Psych negative neurological ROS  negative psych ROS   GI/Hepatic Neg liver ROS, GERD  ,  Endo/Other  diabetes, Oral Hypoglycemic Agents  Renal/GU Renal disease: hx of nephrolithiasis.     Musculoskeletal  (+) Arthritis ,   Abdominal (+) + obese,   Peds  Hematology negative hematology ROS (+)   Anesthesia Other Findings Past Medical History: No date: Arthritis No date: Asthma No date: Cold No date: COPD (chronic obstructive pulmonary disease) (HCC) No date: Diabetes mellitus without complication (HCC) No date: Dyspnea No date: GERD (gastroesophageal reflux disease) No date: Hearing loss associated with syndrome of  both ears 2016: Heart attack ([place]) No date: History of aspiration pneumonitis No date: Kidney cysts No date: Kidney stones No date: Psoriasis   Reproductive/Obstetrics                            Anesthesia Physical Anesthesia Plan  ASA: III  Anesthesia Plan: General   Post-op Pain Management:    Induction: Intravenous  PONV Risk Score and Plan: 1 and Propofol infusion  Airway Management Planned: Natural Airway  Additional Equipment:   Intra-op Plan:   Post-operative Plan:   Informed Consent: I have reviewed the patients History and Physical, chart, labs and discussed the procedure including the risks, benefits and alternatives for the proposed anesthesia with the patient or authorized representative who has indicated his/her understanding and acceptance.   Dental advisory given  Plan Discussed with: CRNA and Anesthesiologist  Anesthesia Plan Comments:         Anesthesia Quick Evaluation

## 2017-09-18 NOTE — H&P (Signed)
Outpatient short stay form Pre-procedure 09/18/2017 8:16 AM Dustin Sails MD  Primary Physician: Eulogio Bear NP  Reason for visit: EGD and colonoscopy  History of present illness: Patient is a 77 year old male presenting today as above.  He has a personal history of a fundoplication being done in about 1992 via a left lateral approach.  Was a quite complicated procedure for him with a number of issues that occurred afterwards.  Continues to have some issues with gas and bloating as well as some occasional dysphagia.  He had a barium swallow that indicates normal passage of a barium tablet.  He is not currently taking a proton pump inhibitor.  States he has an allergy to PPIs which cause burning in the stomach.  He tolerated his prep well.  He is supposed to be on a 325 mg aspirin but states he does not take that.  He denies use of any other aspirin products or blood thinning agent.  It is of note patient is also diabetic has recently been started on insulin.  He states that he cannot take some of the newer medications for diabetes due to them exacerbating epigastric symptoms.   Current Facility-Administered Medications:  .  0.9 %  sodium chloride infusion, , Intravenous, Continuous, Dustin Sails, MD, Last Rate: 20 mL/hr at 09/18/17 0754  Medications Prior to Admission  Medication Sig Dispense Refill Last Dose  . albuterol (PROVENTIL) (2.5 MG/3ML) 0.083% nebulizer solution Take 2.5 mg by nebulization every 4 (four) hours as needed.    09/17/2017 at Unknown time  . aspirin EC 325 MG tablet Take 325 mg by mouth daily.    Past Week at Unknown time  . atorvastatin (LIPITOR) 20 MG tablet Take 20 mg by mouth daily.   09/17/2017 at Unknown time  . budesonide-formoterol (SYMBICORT) 160-4.5 MCG/ACT inhaler Inhale 2 puffs into the lungs 2 (two) times daily.   Past Week at Unknown time  . fluticasone (FLONASE) 50 MCG/ACT nasal spray Place into both nostrils daily.   Past Week at Unknown time  .  montelukast (SINGULAIR) 10 MG tablet Take 10 mg by mouth at bedtime.    Past Week at Unknown time  . naproxen (NAPROSYN) 500 MG tablet Take 500 mg by mouth daily as needed for pain.   Past Month at Unknown time  . sucralfate (CARAFATE) 1 g tablet Take 1 g by mouth 2 (two) times daily.   Past Week at Unknown time  . fluocinonide (LIDEX) 0.05 % external solution Apply 1 application topically 2 (two) times daily as needed.   Not Taking at Unknown time  . hydrocortisone (CORTEF) 5 MG tablet Take 5 mg by mouth daily.   Completed Course at Unknown time  . Testosterone Cypionate POWD 7 Doses by Does not apply route at bedtime. 7 drops   Taking  . tiotropium (SPIRIVA) 18 MCG inhalation capsule Place 18 mcg into inhaler and inhale daily.   Not Taking at Unknown time  . triamcinolone cream (KENALOG) 0.1 % Apply 1 application topically 2 (two) times daily as needed for itching.   Taking  . VENTOLIN HFA 108 (90 Base) MCG/ACT inhaler Take 108 mcg by mouth daily as needed for shortness of breath.    Taking     Allergies  Allergen Reactions  . Actos [Pioglitazone] Palpitations    Pt reported a MI   . Glimepiride Rash    Other reaction(s): RASH  . Metformin Shortness Of Breath    Pt states he "can't breathe  and it gives him sinus congestion". Other reaction(s): OTHER  . Amoxicillin     Other reaction(s): NAUSEA  . Erythromycin Base Nausea Only    Other reaction(s): OTHER  . Esomeprazole Magnesium Other (See Comments)    Other reaction(s): Other (See Comments) Makes patients heart burn worse Makes patients heart burn worse  . Glipizide   . Lipitor [Atorvastatin]   . Liraglutide Other (See Comments)    bloating Other reaction(s): Other (See Comments) bloating  . Penicillins Nausea Only    Has patient had a PCN reaction causing immediate rash, facial/tongue/throat swelling, SOB or lightheadedness with hypotension: no Has patient had a PCN reaction causing severe rash involving mucus membranes or  skin necrosis: no Has patient had a PCN reaction that required hospitalization:no Has patient had a PCN reaction occurring within the last 10 years: no If all of the above answers are "NO", then may proceed with Cephalosporin use.   . Sitagliptin Other (See Comments)    Pt states it "gives him joint pain" & redness Other reaction(s): JOINT PAIN     Past Medical History:  Diagnosis Date  . Arthritis   . Asthma   . Cold   . COPD (chronic obstructive pulmonary disease) ([place])   . Diabetes mellitus without complication ([place])   . Dyspnea   . GERD (gastroesophageal reflux disease)   . Hearing loss associated with syndrome of both ears   . Heart attack ([place]) 2016  . History of aspiration pneumonitis   . Kidney cysts   . Kidney stones   . Psoriasis     Review of systems:      Physical Exam    Heart and lungs: Regular rate and rhythm without rub or gallop, lungs are bilaterally clear.    HEENT: Normocephalic atraumatic eyes are anicteric    Other:    Pertinant exam for procedure: Soft nontender nondistended bowel sounds positive normoactive    Planned proceedures: EGD, colonoscopy and indicated procedures. I have discussed the risks benefits and complications of procedures to include not limited to bleeding, infection, perforation and the risk of sedation and the patient wishes to proceed.    Dustin Sails, MD Gastroenterology 09/18/2017  8:16 AM

## 2017-09-18 NOTE — Op Note (Signed)
[facility] Gastroenterology Patient Name: Dustin Blackburn Procedure Date: 09/18/2017 8:19 AM MRN: [id number] Account #: [id number] Date of Birth: June 05, 1940 Admit Type: Outpatient Age: 77 Room: [facility] ENDO ROOM 3 Gender: Male Note Status: Finalized Procedure:            Colonoscopy Indications:          Screening for colorectal malignant neoplasm Providers:            Lollie Sails, MD Referring MD:         Dani Gobble. Dema Severin, MD (Referring MD) Medicines:            Monitored Anesthesia Care Complications:        No immediate complications. Procedure:            Pre-Anesthesia Assessment:                       - ASA Grade Assessment: III - A patient with severe                        systemic disease.                       After obtaining informed consent, the colonoscope was                        passed under direct vision. Throughout the procedure,                        the patient's blood pressure, pulse, and oxygen                        saturations were monitored continuously. The                        Colonoscope was introduced through the anus and                        advanced to the the cecum, identified by appendiceal                        orifice and ileocecal valve. The colonoscopy was                        performed with moderate difficulty due to poor bowel                        prep. Successful completion of the procedure was aided                        by lavage. The patient tolerated the procedure well.                        The quality of the bowel preparation was fair. Findings:      A 6 mm polyp was found in the splenic flexure. The polyp was sessile.       The polyp was removed with a cold snare. Resection and retrieval were       complete.      Two sessile polyps were found in the hepatic flexure. The polyps were 7       to 8 mm in  size. These polyps were removed with a cold snare. Resection       and retrieval were complete.    A few small-mouthed diverticula were found in the sigmoid colon and       distal descending colon.      Non-bleeding internal hemorrhoids were found during retroflexion. The       hemorrhoids were small and Grade I (internal hemorrhoids that do not       prolapse).      The digital rectal exam findings include enlarged prostate. Impression:           - Preparation of the colon was fair.                       - One 6 mm polyp at the splenic flexure, removed with a                        cold snare. Resected and retrieved.                       - Two 7 to 8 mm polyps at the hepatic flexure, removed                        with a cold snare. Resected and retrieved.                       - Diverticulosis in the sigmoid colon and in the distal                        descending colon.                       - Non-bleeding internal hemorrhoids.                       - Enlarged prostate found on digital rectal exam. Recommendation:       - Discharge patient to home.                       - Soft diet for 1 day, then advance as tolerated to                        advance diet as tolerated. Procedure Code(s):    --- Professional ---                       [phone], Colonoscopy, flexible; with removal of tumor(s),                        polyp(s), or other lesion(s) by snare technique Diagnosis Code(s):    --- Professional ---                       D12.3, Benign neoplasm of transverse colon (hepatic                        flexure or splenic flexure)                       Z12.11, Encounter for screening for malignant neoplasm  of colon                       K64.0, First degree hemorrhoids                       K57.30, Diverticulosis of large intestine without                        perforation or abscess without bleeding                       N40.0, Benign prostatic hyperplasia without lower                        urinary tract symptoms CPT copyright 2017 American Medical  Association. All rights reserved. The codes documented in this report are preliminary and upon coder review may  be revised to meet current compliance requirements. Lollie Sails, MD 09/18/2017 9:24:08 AM This report has been signed electronically. Number of Addenda: 0 Note Initiated On: 09/18/2017 8:19 AM Scope Withdrawal Time: 0 hours 8 minutes 43 seconds  Total Procedure Duration: 0 hours 23 minutes 33 seconds       [facility]

## 2017-09-18 NOTE — Op Note (Addendum)
[facility] Gastroenterology Patient Name: Dustin Blackburn Procedure Date: 09/18/2017 8:19 AM MRN: [id number] Account #: [id number] Date of Birth: 1940/06/14 Admit Type: Outpatient Age: 77 Room: [facility] ENDO ROOM 3 Gender: Male Note Status: Finalized Procedure:            Upper GI endoscopy Indications:          Epigastric abdominal pain, Abdominal bloating, Nausea Providers:            Lollie Sails, MD Referring MD:         Dani Gobble. Dema Severin, MD (Referring MD) Medicines:            Monitored Anesthesia Care Complications:        No immediate complications. Procedure:            Pre-Anesthesia Assessment:                       - ASA Grade Assessment: III - A patient with severe                        systemic disease.                       After obtaining informed consent, the endoscope was                        passed under direct vision. Throughout the procedure,                        the patient's blood pressure, pulse, and oxygen                        saturations were monitored continuously. The Endoscope                        was introduced through the mouth, and advanced to the                        third part of duodenum. The upper GI endoscopy was                        accomplished without difficulty. The patient tolerated                        the procedure well. Findings:      A non-bleeding diverticulum with a moderate opening and no stigmata of       recent bleeding was found in the lower third of the esophagus about 1.5       cm above the GE junction. Marland Kitchen      LA Grade A (one or more mucosal breaks less than 5 mm, not extending       between tops of 2 mucosal folds) esophagitis with no bleeding was found.       Biopsies were taken with a cold forceps for histology.      Diffuse, white plaques were found in the upper third of the esophagus,       in the middle third of the esophagus and in the lower third of the       esophagus. Cells for  cytology were obtained by brushing.      Diffuse and patchy mild inflammation characterized by congestion       (  edema), erosions and erythema was found in the gastric body and in the       gastric antrum. Biopsies were taken with a cold forceps for histology.       Biopsies were taken with a cold forceps for Helicobacter pylori testing.      Patchy mild inflammation characterized by congestion (edema), erosions       and erythema was found in the duodenal bulb.      The exam of the duodenum was otherwise normal.      The cardia and gastric fundus were normal on retroflexion.      There are several venous lakes in the upper and mid esophagus. No       evidence of varices. Impression:           - Diverticulum in the lower third of the esophagus.                       - LA Grade A erosive esophagitis. Biopsied.                       - Esophageal plaques were found, consistent with                        candidiasis. Cells for cytology obtained.                       - Gastritis. Biopsied.                       - Duodenitis. Recommendation:       - Use Zantac (ranitidine) 150 mg PO BID daily.                       - Return to GI clinic in 5 weeks.                       - Mycelex (clotrimazole) 10 mg lozenge 5x/day for 1                        week. Procedure Code(s):    --- Professional ---                       [phone], Esophagogastroduodenoscopy, flexible, transoral;                        with biopsy, single or multiple Diagnosis Code(s):    --- Professional ---                       Q39.6, Congenital diverticulum of esophagus                       K20.8, Other esophagitis                       K22.9, Disease of esophagus, unspecified                       K29.70, Gastritis, unspecified, without bleeding                       K29.80, Duodenitis without bleeding  R10.13, Epigastric pain                       R14.0, Abdominal distension (gaseous)                        R11.0, Nausea CPT copyright 2017 American Medical Association. All rights reserved. The codes documented in this report are preliminary and upon coder review may  be revised to meet current compliance requirements. Lollie Sails, MD 09/18/2017 8:54:02 AM This report has been signed electronically. Number of Addenda: 0 Note Initiated On: 09/18/2017 8:19 AM      [facility]

## 2017-09-18 NOTE — Anesthesia Post-op Follow-up Note (Signed)
Anesthesia QCDR form completed.        

## 2017-09-19 ENCOUNTER — Encounter: Payer: Self-pay | Admitting: Gastroenterology

## 2017-09-19 LAB — SURGICAL PATHOLOGY

## 2017-09-21 ENCOUNTER — Other Ambulatory Visit
Admission: RE | Admit: 2017-09-21 | Discharge: 2017-09-21 | Disposition: A | Discharge status: Home / Self Care | Payer: Medicare HMO | Source: Ambulatory Visit | Attending: Urology | Admitting: Urology

## 2017-09-21 ENCOUNTER — Ambulatory Visit (INDEPENDENT_AMBULATORY_CARE_PROVIDER_SITE_OTHER): Payer: Medicare HMO | Admitting: Urology

## 2017-09-21 ENCOUNTER — Encounter: Payer: Self-pay | Admitting: Urology

## 2017-09-21 ENCOUNTER — Other Ambulatory Visit: Payer: Self-pay

## 2017-09-21 VITALS — BP 138/75 | HR 73 | Ht 70.0 in | Wt 210.0 lb

## 2017-09-21 DIAGNOSIS — C679 Malignant neoplasm of bladder, unspecified: Secondary | ICD-10-CM | POA: Diagnosis not present

## 2017-09-21 LAB — URINALYSIS, COMPLETE (UACMP) WITH MICROSCOPIC
Bilirubin Urine: NEGATIVE
Glucose, UA: 250 mg/dL — AB
Hgb urine dipstick: NEGATIVE
Ketones, ur: 15 mg/dL — AB
Leukocytes, UA: NEGATIVE
Nitrite: NEGATIVE
Protein, ur: NEGATIVE mg/dL
Specific Gravity, Urine: 1.025 (ref 1.005–1.030)
pH: 5 (ref 5.0–8.0)

## 2017-09-21 MED ORDER — CIPROFLOXACIN HCL 500 MG PO TABS
500.0000 mg | ORAL_TABLET | Freq: Once | ORAL | Status: AC
Start: 2017-09-21 — End: 2017-09-21
  Administered 2017-09-21: 500 mg via ORAL

## 2017-09-21 MED ORDER — LIDOCAINE HCL URETHRAL/MUCOSAL 2 % EX GEL
1.0000 "application " | Freq: Once | CUTANEOUS | Status: AC
  Administered 2017-09-21: 1 via URETHRAL

## 2017-09-21 NOTE — H&P (View-Only) (Signed)
   09/21/17  CC:  Chief Complaint  Patient presents with  . Cysto    HPI: 77 year-old male with a history of low-grade noninvasive bladder cancer returns today for office cystoscopy.  He was initially found to have incidental bladder wall thickening on CT abdomen pelvis on 03/2016. Cystoscopy showed a papillary proximal and 1 cm tumor at the dome of the bladder. He was taken to the operating room on 06/12/2016 for TURBT, bilateral retrograde, and insufflation mitomycin.  Surgical pathology was consistent with low-grade, noninvasive tumor.  No urinary complaints today.    Blood pressure (!) 145/80, pulse 76, height 5\' 10"  (1.778 m), weight 212 lb (96.2 kg). NED. A&Ox3.   No respiratory distress   Abd soft, NT, ND Normal phallus with bilateral descended testicles  Cystoscopy Procedure Note  Patient identification was confirmed, informed consent was obtained, and patient was prepped using Betadine solution.  Lidocaine jelly was administered per urethral meatus.    Preoperative abx where received prior to procedure.     Pre-Procedure: - Inspection reveals a normal caliber ureteral meatus.  Procedure: The flexible cystoscope was introduced without difficulty - No urethral strictures/lesions are present. - Enlarged prostate with trilobar coaptation, friable prostatic mucosa - Normal bladder neck - Bilateral ureteral orifices identified - Bladder left posterior dome with 1 cm mildly raised, erythematous with small central pit.  No papillary change.   - No bladder stones - No trabeculation  Retroflexion unremarkable  Post-Procedure: - Patient tolerated the procedure well  Assessment/ Plan:   1. 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  We discussed the risk of surgery including risk of bleeding, infection, damage surrounding structures, amongst others.  All questions were answered.  - ciprofloxacin (CIPRO) tablet 500 mg; Take 1 tablet (500 mg total) by mouth once. - lidocaine (XYLOCAINE) 2 % jelly 1 application; Place 1 application into the urethra once.  Will arrange for bladder biopsy  Hollice Espy, MD

## 2017-09-21 NOTE — Addendum Note (Signed)
Addended by: Tommy Rainwater on: 09/21/2017 02:19 PM   Modules accepted: Orders

## 2017-09-21 NOTE — Progress Notes (Signed)
   09/21/17  CC:  Chief Complaint  Patient presents with  . Cysto    HPI: 77 year-old male with a history of low-grade noninvasive bladder cancer returns today for office cystoscopy.  He was initially found to have incidental bladder wall thickening on CT abdomen pelvis on 03/2016. Cystoscopy showed a papillary proximal and 1 cm tumor at the dome of the bladder. He was taken to the operating room on 06/12/2016 for TURBT, bilateral retrograde, and insufflation mitomycin.  Surgical pathology was consistent with low-grade, noninvasive tumor.  No urinary complaints today.    Blood pressure (!) 145/80, pulse 76, height 5\' 10"  (1.778 m), weight 212 lb (96.2 kg). NED. A&Ox3.   No respiratory distress   Abd soft, NT, ND Normal phallus with bilateral descended testicles  Cystoscopy Procedure Note  Patient identification was confirmed, informed consent was obtained, and patient was prepped using Betadine solution.  Lidocaine jelly was administered per urethral meatus.    Preoperative abx where received prior to procedure.     Pre-Procedure: - Inspection reveals a normal caliber ureteral meatus.  Procedure: The flexible cystoscope was introduced without difficulty - No urethral strictures/lesions are present. - Enlarged prostate with trilobar coaptation, friable prostatic mucosa - Normal bladder neck - Bilateral ureteral orifices identified - Bladder left posterior dome with 1 cm mildly raised, erythematous with small central pit.  No papillary change.   - No bladder stones - No trabeculation  Retroflexion unremarkable  Post-Procedure: - Patient tolerated the procedure well  Assessment/ Plan:   1. 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  We discussed the risk of surgery including risk of bleeding, infection, damage surrounding structures, amongst others.  All questions were answered.  - ciprofloxacin (CIPRO) tablet 500 mg; Take 1 tablet (500 mg total) by mouth once. - lidocaine (XYLOCAINE) 2 % jelly 1 application; Place 1 application into the urethra once.  Will arrange for bladder biopsy  Hollice Espy, MD

## 2017-09-22 LAB — URINE CULTURE: Culture: NO GROWTH

## 2017-09-24 ENCOUNTER — Other Ambulatory Visit: Payer: Self-pay | Admitting: Radiology

## 2017-09-24 DIAGNOSIS — C679 Malignant neoplasm of bladder, unspecified: Secondary | ICD-10-CM

## 2017-09-25 ENCOUNTER — Telehealth: Payer: Self-pay | Admitting: Radiology

## 2017-09-25 NOTE — Telephone Encounter (Signed)
Pt aware of bladder biopsy scheduled with Dr Erlene Quan on 10/02/2017 & pre-admit testing appt scheduled 09/28/2017 @10 :00. Pt voices understanding & has no questions at this time.

## 2017-09-25 NOTE — Telephone Encounter (Signed)
-----   Message from Hollice Espy, MD sent at 09/21/2017  2:04 PM EDT ----- Please schedule gentleman up for cystoscopy, bilateral retrograde pyelogram, bladder biopsy.  Preop urine culture sent.  No other labs needed.  Preop cipro.

## 2017-09-28 ENCOUNTER — Other Ambulatory Visit: Payer: Self-pay

## 2017-09-28 ENCOUNTER — Encounter
Admission: RE | Admit: 2017-09-28 | Discharge: 2017-09-28 | Disposition: A | Discharge status: Home / Self Care | Payer: Medicare HMO | Source: Ambulatory Visit | Attending: Urology | Admitting: Urology

## 2017-09-28 DIAGNOSIS — Z01810 Encounter for preprocedural cardiovascular examination: Secondary | ICD-10-CM | POA: Insufficient documentation

## 2017-09-28 DIAGNOSIS — I2510 Atherosclerotic heart disease of native coronary artery without angina pectoris: Secondary | ICD-10-CM | POA: Diagnosis not present

## 2017-09-28 DIAGNOSIS — R140 Abdominal distension (gaseous): Secondary | ICD-10-CM | POA: Diagnosis not present

## 2017-09-28 DIAGNOSIS — R110 Nausea: Secondary | ICD-10-CM | POA: Insufficient documentation

## 2017-09-28 DIAGNOSIS — R1013 Epigastric pain: Secondary | ICD-10-CM | POA: Diagnosis not present

## 2017-09-28 DIAGNOSIS — E119 Type 2 diabetes mellitus without complications: Secondary | ICD-10-CM | POA: Diagnosis not present

## 2017-09-28 HISTORY — DX: Hyperlipidemia, unspecified: E78.5

## 2017-09-28 HISTORY — DX: Personal history of urinary calculi: Z87.442

## 2017-09-28 HISTORY — DX: Other seasonal allergic rhinitis: J30.2

## 2017-09-28 HISTORY — DX: Malignant (primary) neoplasm, unspecified: C80.1

## 2017-09-28 HISTORY — DX: Atherosclerotic heart disease of native coronary artery without angina pectoris: I25.10

## 2017-09-28 HISTORY — DX: Spondylosis, unspecified: M47.9

## 2017-09-28 HISTORY — DX: Other cervical disc degeneration, unspecified cervical region: M50.30

## 2017-09-28 HISTORY — DX: Spondylosis without myelopathy or radiculopathy, thoracolumbar region: M47.815

## 2017-09-28 MED ORDER — SODIUM CHLORIDE 0.9 % IV SOLN
INTRAVENOUS | Status: DC
  Filled 2017-09-28: qty 1000

## 2017-09-28 NOTE — Patient Instructions (Signed)
Your procedure is scheduled on: Tuesday, Oct 02, 2017 Report to Day Surgery on the 2nd floor of the [facility]. To find out your arrival time, please call [phone] between 1PM - 3PM on: Monday, Oct 01, 2017  REMEMBER: Instructions that are not followed completely may result in serious medical risk, up to and including death; or upon the discretion of your surgeon and anesthesiologist your surgery may need to be rescheduled.  Do not eat food after midnight the night before your procedure.  No gum chewing, lozengers or hard candies.  You may however, drink water up to 2 hours before you are scheduled to arrive for your surgery. Do not drink anything within 2 hours of the start of your surgery.  No Alcohol for 24 hours before or after surgery.  No Smoking including e-cigarettes for 24 hours prior to surgery.  No chewable tobacco products for at least 6 hours prior to surgery.  No nicotine patches on the day of surgery.  On the morning of surgery brush your teeth with toothpaste and water, you may rinse your mouth with mouthwash if you wish. Do not swallow any toothpaste or mouthwash.  Notify your doctor if there is any change in your medical condition (cold, fever, infection).  Do not wear jewelry, make-up, hairpins, clips or nail polish.  Do not wear lotions, powders, or perfumes. You may wear deodorant.  Do not shave 48 hours prior to surgery. Men may shave face and neck.  Contacts and dentures may not be worn into surgery.  Do not bring valuables to the hospital, including drivers license, insurance or credit cards.  [facility] is not responsible for any belongings or valuables.   TAKE THESE MEDICATIONS THE MORNING OF SURGERY:  1.  Albuterol nebulizer 2.  Symbicort inhaler 3.  Sucralfate  Use CHG Soap or wipes as directed on instruction sheet.  Fleets enema or Magnesium Citrate as directed.  Use inhalers on the day of surgery and bring to the hospital.  Take 1/2  of usual insulin dose the night before surgery and none on the morning of surgery. Take 5 units of Novolin N the night before surgery and none on the morning of surgery.  NOW!  Stop ASPIRIN and Anti-inflammatories (NSAIDS) such as Advil, Aleve, Ibuprofen, Motrin, Naproxen, Naprosyn and Aspirin based products such as Excedrin, Goodys Powder, BC Powder. (May take Tylenol or Acetaminophen if needed.)  NOW!  Stop ANY OVER THE COUNTER supplements until after surgery.  Wear comfortable clothing (specific to your surgery type) to the hospital.  Plan for stool softeners for home use.  If you are being discharged the day of surgery, you will not be allowed to drive home. You will need a responsible adult to drive you home and stay with you that night.   If you are taking public transportation, you will need to have a responsible adult with you. Please confirm with your physician that it is acceptable to use public transportation.   Please call [phone] if you have any questions about these instructions.

## 2017-10-01 MED ORDER — CIPROFLOXACIN IN D5W 400 MG/200ML IV SOLN
400.0000 mg | INTRAVENOUS | Status: AC
  Administered 2017-10-02: 400 mg via INTRAVENOUS

## 2017-10-02 ENCOUNTER — Other Ambulatory Visit: Payer: Self-pay

## 2017-10-02 ENCOUNTER — Ambulatory Visit: Payer: Medicare HMO | Admitting: Certified Registered Nurse Anesthetist

## 2017-10-02 ENCOUNTER — Ambulatory Visit
Admission: RE | Admit: 2017-10-02 | Discharge: 2017-10-02 | Disposition: A | Discharge status: Home / Self Care | Payer: Medicare HMO | Source: Ambulatory Visit | Attending: Urology | Admitting: Urology

## 2017-10-02 ENCOUNTER — Encounter: Payer: Self-pay | Admitting: *Deleted

## 2017-10-02 ENCOUNTER — Encounter
Admission: RE | Disposition: A | Discharge status: Home / Self Care | Payer: Self-pay | Source: Ambulatory Visit | Attending: Urology

## 2017-10-02 DIAGNOSIS — Z79899 Other long term (current) drug therapy: Secondary | ICD-10-CM | POA: Diagnosis not present

## 2017-10-02 DIAGNOSIS — Z794 Long term (current) use of insulin: Secondary | ICD-10-CM | POA: Insufficient documentation

## 2017-10-02 DIAGNOSIS — E669 Obesity, unspecified: Secondary | ICD-10-CM | POA: Diagnosis not present

## 2017-10-02 DIAGNOSIS — D494 Neoplasm of unspecified behavior of bladder: Secondary | ICD-10-CM

## 2017-10-02 DIAGNOSIS — M1990 Unspecified osteoarthritis, unspecified site: Secondary | ICD-10-CM | POA: Insufficient documentation

## 2017-10-02 DIAGNOSIS — Z888 Allergy status to other drugs, medicaments and biological substances status: Secondary | ICD-10-CM | POA: Diagnosis not present

## 2017-10-02 DIAGNOSIS — I2510 Atherosclerotic heart disease of native coronary artery without angina pectoris: Secondary | ICD-10-CM | POA: Diagnosis not present

## 2017-10-02 DIAGNOSIS — J449 Chronic obstructive pulmonary disease, unspecified: Secondary | ICD-10-CM | POA: Insufficient documentation

## 2017-10-02 DIAGNOSIS — Z880 Allergy status to penicillin: Secondary | ICD-10-CM | POA: Diagnosis not present

## 2017-10-02 DIAGNOSIS — C679 Malignant neoplasm of bladder, unspecified: Secondary | ICD-10-CM

## 2017-10-02 DIAGNOSIS — Z6830 Body mass index (BMI) 30.0-30.9, adult: Secondary | ICD-10-CM | POA: Diagnosis not present

## 2017-10-02 DIAGNOSIS — N3289 Other specified disorders of bladder: Secondary | ICD-10-CM | POA: Insufficient documentation

## 2017-10-02 DIAGNOSIS — Z7982 Long term (current) use of aspirin: Secondary | ICD-10-CM | POA: Insufficient documentation

## 2017-10-02 DIAGNOSIS — Z8551 Personal history of malignant neoplasm of bladder: Secondary | ICD-10-CM | POA: Insufficient documentation

## 2017-10-02 DIAGNOSIS — Z881 Allergy status to other antibiotic agents status: Secondary | ICD-10-CM | POA: Diagnosis not present

## 2017-10-02 DIAGNOSIS — K219 Gastro-esophageal reflux disease without esophagitis: Secondary | ICD-10-CM | POA: Insufficient documentation

## 2017-10-02 DIAGNOSIS — Z87891 Personal history of nicotine dependence: Secondary | ICD-10-CM | POA: Diagnosis not present

## 2017-10-02 DIAGNOSIS — I252 Old myocardial infarction: Secondary | ICD-10-CM | POA: Insufficient documentation

## 2017-10-02 DIAGNOSIS — N329 Bladder disorder, unspecified: Secondary | ICD-10-CM | POA: Diagnosis present

## 2017-10-02 DIAGNOSIS — E119 Type 2 diabetes mellitus without complications: Secondary | ICD-10-CM | POA: Diagnosis not present

## 2017-10-02 HISTORY — PX: CYSTOSCOPY WITH URETEROSCOPY: SHX5123

## 2017-10-02 HISTORY — PX: CYSTOSCOPY WITH BIOPSY: SHX5122

## 2017-10-02 HISTORY — PX: CYSTOSCOPY W/ RETROGRADES: SHX1426

## 2017-10-02 LAB — GLUCOSE, CAPILLARY
Glucose-Capillary: 174 mg/dL — ABNORMAL HIGH (ref 65–99)
Glucose-Capillary: 200 mg/dL — ABNORMAL HIGH (ref 65–99)

## 2017-10-02 SURGERY — CYSTOSCOPY, WITH BIOPSY
Anesthesia: General | Laterality: Right

## 2017-10-02 MED ORDER — ONDANSETRON HCL 4 MG/2ML IJ SOLN: 4.0000 mg | Freq: Once | INTRAMUSCULAR | Status: DC | PRN

## 2017-10-02 MED ORDER — LIDOCAINE HCL (CARDIAC) PF 100 MG/5ML IV SOSY
PREFILLED_SYRINGE | INTRAVENOUS | Status: DC | PRN
  Administered 2017-10-02: 80 mg via INTRAVENOUS

## 2017-10-02 MED ORDER — FENTANYL CITRATE (PF) 100 MCG/2ML IJ SOLN
INTRAMUSCULAR | Status: DC | PRN
  Administered 2017-10-02: 50 ug via INTRAVENOUS

## 2017-10-02 MED ORDER — DEXAMETHASONE SODIUM PHOSPHATE 10 MG/ML IJ SOLN
INTRAMUSCULAR | Status: DC | PRN
  Administered 2017-10-02: 5 mg via INTRAVENOUS

## 2017-10-02 MED ORDER — ALBUTEROL SULFATE HFA 108 (90 BASE) MCG/ACT IN AERS
INHALATION_SPRAY | RESPIRATORY_TRACT | Status: AC
  Filled 2017-10-02: qty 6.7

## 2017-10-02 MED ORDER — IOTHALAMATE MEGLUMINE 43 % IV SOLN
INTRAVENOUS | Status: DC | PRN
  Administered 2017-10-02: 40 mL

## 2017-10-02 MED ORDER — LIDOCAINE HCL (PF) 2 % IJ SOLN
INTRAMUSCULAR | Status: AC
  Filled 2017-10-02: qty 10

## 2017-10-02 MED ORDER — PROPOFOL 10 MG/ML IV BOLUS
INTRAVENOUS | Status: DC | PRN
  Administered 2017-10-02: 150 mg via INTRAVENOUS

## 2017-10-02 MED ORDER — LACTATED RINGERS IV SOLN
INTRAVENOUS | Status: DC | PRN
  Administered 2017-10-02: 09:00:00 via INTRAVENOUS

## 2017-10-02 MED ORDER — DOCUSATE SODIUM 100 MG PO CAPS: 100.0000 mg | ORAL_CAPSULE | Freq: Two times a day (BID) | ORAL | 0 refills | Status: DC

## 2017-10-02 MED ORDER — SEVOFLURANE IN SOLN
RESPIRATORY_TRACT | Status: AC
  Filled 2017-10-02: qty 250

## 2017-10-02 MED ORDER — MIDAZOLAM HCL 2 MG/2ML IJ SOLN
INTRAMUSCULAR | Status: AC
  Filled 2017-10-02: qty 2

## 2017-10-02 MED ORDER — CIPROFLOXACIN IN D5W 400 MG/200ML IV SOLN
INTRAVENOUS | Status: AC
  Filled 2017-10-02: qty 200

## 2017-10-02 MED ORDER — HYDROCODONE-ACETAMINOPHEN 5-325 MG PO TABS: 1.0000 | ORAL_TABLET | Freq: Four times a day (QID) | ORAL | 0 refills | Status: DC | PRN

## 2017-10-02 MED ORDER — FENTANYL CITRATE (PF) 250 MCG/5ML IJ SOLN
INTRAMUSCULAR | Status: AC
  Filled 2017-10-02: qty 5

## 2017-10-02 MED ORDER — EPHEDRINE SULFATE 50 MG/ML IJ SOLN
INTRAMUSCULAR | Status: DC | PRN
  Administered 2017-10-02: 10 mg via INTRAVENOUS

## 2017-10-02 MED ORDER — PROPOFOL 10 MG/ML IV BOLUS
INTRAVENOUS | Status: AC
  Filled 2017-10-02: qty 20

## 2017-10-02 MED ORDER — FENTANYL CITRATE (PF) 100 MCG/2ML IJ SOLN: 25.0000 ug | INTRAMUSCULAR | Status: DC | PRN

## 2017-10-02 MED ORDER — SUCCINYLCHOLINE CHLORIDE 20 MG/ML IJ SOLN
INTRAMUSCULAR | Status: AC
  Filled 2017-10-02: qty 1

## 2017-10-02 MED ORDER — EPHEDRINE SULFATE 50 MG/ML IJ SOLN
INTRAMUSCULAR | Status: AC
Start: 2017-10-02 — End: ?
  Filled 2017-10-02: qty 1

## 2017-10-02 MED ORDER — ONDANSETRON HCL 4 MG/2ML IJ SOLN
INTRAMUSCULAR | Status: DC | PRN
  Administered 2017-10-02: 4 mg via INTRAVENOUS

## 2017-10-02 MED ORDER — MIDAZOLAM HCL 2 MG/2ML IJ SOLN
INTRAMUSCULAR | Status: DC | PRN
  Administered 2017-10-02: 1 mg via INTRAVENOUS

## 2017-10-02 MED ORDER — SODIUM CHLORIDE 0.9 % IV SOLN
INTRAVENOUS | Status: DC
  Administered 2017-10-02: 08:00:00 via INTRAVENOUS

## 2017-10-02 SURGICAL SUPPLY — 25 items
BAG DRAIN CYSTO-URO LG1000N (MISCELLANEOUS) ×4 IMPLANT
BRUSH SCRUB EZ  4% CHG (MISCELLANEOUS) ×1
BRUSH SCRUB EZ 4% CHG (MISCELLANEOUS) ×3 IMPLANT
CATH URETL 5X70 OPEN END (CATHETERS) ×4 IMPLANT
CONRAY 43 FOR UROLOGY 50M (MISCELLANEOUS) ×4 IMPLANT
DRAPE UTILITY 15X26 TOWEL STRL (DRAPES) ×4 IMPLANT
DRSG TELFA 4X3 1S NADH ST (GAUZE/BANDAGES/DRESSINGS) ×4 IMPLANT
ELECT REM PT RETURN 9FT ADLT (ELECTROSURGICAL) ×4
ELECTRODE REM PT RTRN 9FT ADLT (ELECTROSURGICAL) ×3 IMPLANT
GLOVE BIO SURGEON STRL SZ 6.5 (GLOVE) ×4 IMPLANT
GOWN STRL REUS W/ TWL LRG LVL3 (GOWN DISPOSABLE) ×6 IMPLANT
GOWN STRL REUS W/TWL LRG LVL3 (GOWN DISPOSABLE) ×2
GUIDEWIRE GREEN .038 145CM (MISCELLANEOUS) ×4 IMPLANT
KIT TURNOVER CYSTO (KITS) ×4 IMPLANT
NDL SAFETY ECLIPSE 18X1.5 (NEEDLE) ×3 IMPLANT
NEEDLE HYPO 18GX1.5 SHARP (NEEDLE) ×1
PACK CYSTO AR (MISCELLANEOUS) ×4 IMPLANT
SENSORWIRE 0.038 NOT ANGLED (WIRE) ×4
SET CYSTO W/LG BORE CLAMP LF (SET/KITS/TRAYS/PACK) ×4 IMPLANT
SOL .9 NS 3000ML IRR  AL (IV SOLUTION) ×1
SOL .9 NS 3000ML IRR UROMATIC (IV SOLUTION) ×3 IMPLANT
SURGILUBE 2OZ TUBE FLIPTOP (MISCELLANEOUS) ×4 IMPLANT
WATER STERILE IRR 1000ML POUR (IV SOLUTION) ×4 IMPLANT
WATER STERILE IRR 3000ML UROMA (IV SOLUTION) ×4 IMPLANT
WIRE SENSOR 0.038 NOT ANGLED (WIRE) ×3 IMPLANT

## 2017-10-02 NOTE — Anesthesia Postprocedure Evaluation (Signed)
Anesthesia Post Note  Patient: Dustin Blackburn  Procedure(s) Performed: CYSTOSCOPY WITH Bladder BIOPSY (N/A ) CYSTOSCOPY WITH RETROGRADE PYELOGRAM (Bilateral ) DIAGNOSTIC RIGHT URETEROSCOPY (Right )  Patient location during evaluation: PACU Anesthesia Type: General Level of consciousness: awake and alert and oriented Pain management: pain level controlled Vital Signs Assessment: post-procedure vital signs reviewed and stable Respiratory status: spontaneous breathing Cardiovascular status: blood pressure returned to baseline Anesthetic complications: no     Last Vitals:  Vitals:   10/02/17 1038 10/02/17 1042  BP: 132/70 120/65  Pulse: 72   Resp: 14   Temp: (!) 36.2 C   SpO2: 95% 97%    Last Pain:  Vitals:   10/02/17 1038  TempSrc:   PainSc: 2                  Tyran Huser

## 2017-10-02 NOTE — Interval H&P Note (Signed)
History and Physical Interval Note:  10/02/2017 8:40 AM  Dustin Blackburn  has presented today for surgery, with the diagnosis of malignant neoplasm of urinary bladder  The various methods of treatment have been discussed with the patient and family. After consideration of risks, benefits and other options for treatment, the patient has consented to  Procedure(s): CYSTOSCOPY WITH Bladder BIOPSY (N/A) CYSTOSCOPY WITH RETROGRADE PYELOGRAM (Bilateral) as a surgical intervention .  The patient's history has been reviewed, patient examined, no change in status, stable for surgery.  I have reviewed the patient's chart and labs.  Questions were answered to the patient's satisfaction.    RRR CTAB  Hollice Espy

## 2017-10-02 NOTE — Anesthesia Preprocedure Evaluation (Signed)
Anesthesia Evaluation  Patient identified by MRN, date of birth, ID band Patient awake    Reviewed: Allergy & Precautions, NPO status , Patient's Chart, lab work & pertinent test results  History of Anesthesia Complications Negative for: history of anesthetic complications  Airway Mallampati: II  TM Distance: >3 FB Neck ROM: Full    Dental  (+) Upper Dentures   Pulmonary shortness of breath and with exertion, asthma , neg sleep apnea, COPD,  COPD inhaler, former smoker,    breath sounds clear to auscultation- rhonchi (-) wheezing      Cardiovascular Exercise Tolerance: Good (-) angina+ CAD, + Past MI and + Cardiac Stents (~ 1 year ago had MI and one stent placed)  (-) CABG  Rhythm:Regular Rate:Normal - Systolic murmurs and - Diastolic murmurs    Neuro/Psych  Neuromuscular disease negative neurological ROS  negative psych ROS   GI/Hepatic Neg liver ROS, GERD  ,  Endo/Other  diabetes, Type 2, Oral Hypoglycemic Agents  Renal/GU Renal disease: hx of nephrolithiasis.     Musculoskeletal  (+) Arthritis ,   Abdominal (+) + obese,   Peds  Hematology negative hematology ROS (+)   Anesthesia Other Findings Past Medical History: No date: Arthritis No date: Asthma No date: Cold No date: COPD (chronic obstructive pulmonary disease) (* No date: Diabetes mellitus without complication (HCC) No date: Dyspnea No date: GERD (gastroesophageal reflux disease) 2016: Heart attack No date: Kidney cysts No date: Kidney stones No date: Psoriasis   Reproductive/Obstetrics                             Anesthesia Physical  Anesthesia Plan  ASA: III  Anesthesia Plan: General   Post-op Pain Management:    Induction: Intravenous  PONV Risk Score and Plan:   Airway Management Planned: Oral ETT  Additional Equipment:   Intra-op Plan:   Post-operative Plan: Extubation in OR  Informed Consent: I  have reviewed the patients History and Physical, chart, labs and discussed the procedure including the risks, benefits and alternatives for the proposed anesthesia with the patient or authorized representative who has indicated his/her understanding and acceptance.   Dental advisory given  Plan Discussed with: CRNA and Anesthesiologist  Anesthesia Plan Comments:         Anesthesia Quick Evaluation

## 2017-10-02 NOTE — Discharge Instructions (Signed)
Transurethral Resection of Bladder Tumor (TURBT) or Bladder Biopsy   Definition:  Transurethral Resection of the Bladder Tumor is a surgical procedure used to diagnose and remove tumors within the bladder. TURBT is the most common treatment for early stage bladder cancer.  General instructions:     Your recent bladder surgery requires very little post hospital care but some definite precautions.  Despite the fact that no skin incisions were used, the area around the bladder incisions are raw and covered with scabs to promote healing and prevent bleeding. Certain precautions are needed to insure that the scabs are not disturbed over the next 2-4 weeks while the healing proceeds.  Because the raw surface inside your bladder and the irritating effects of urine you may expect frequency of urination and/or urgency (a stronger desire to urinate) and perhaps even getting up at night more often. This will usually resolve or improve slowly over the healing period. You may see some blood in your urine over the first 6 weeks. Do not be alarmed, even if the urine was clear for a while. Get off your feet and drink lots of fluids until clearing occurs. If you start to pass clots or don't improve call us.  Diet:  You may return to your normal diet immediately. Because of the raw surface of your bladder, alcohol, spicy foods, foods high in acid and drinks with caffeine may cause irritation or frequency and should be used in moderation. To keep your urine flowing freely and avoid constipation, drink plenty of fluids during the day (8-10 glasses). Tip: Avoid cranberry juice because it is very acidic.  Activity:  Your physical activity doesn't need to be restricted. However, if you are very active, you may see some blood in the urine. We suggest that you reduce your activity under the circumstances until the bleeding has stopped.  Bowels:  It is important to keep your bowels regular during the postoperative  period. Straining with bowel movements can cause bleeding. A bowel movement every other day is reasonable. Use a mild laxative if needed, such as milk of magnesia 2-3 tablespoons, or 2 Dulcolax tablets. Call if you continue to have problems. If you had been taking narcotics for pain, before, during or after your surgery, you may be constipated. Take a laxative if necessary.    Medication:  You should resume your pre-surgery medications unless told not to. In addition you may be given an antibiotic to prevent or treat infection. Antibiotics are not always necessary. All medication should be taken as prescribed until the bottles are finished unless you are having an unusual reaction to one of the drugs.   [place], [place] [address] [phone]   AMBULATORY SURGERY  DISCHARGE INSTRUCTIONS   1) The drugs that you were given will stay in your system until tomorrow so for the next 24 hours you should not:  A) Drive an automobile B) Make any legal decisions C) Drink any alcoholic beverage   2) You may resume regular meals tomorrow.  Today it is better to start with liquids and gradually work up to solid foods.  You may eat anything you prefer, but it is better to start with liquids, then soup and crackers, and gradually work up to solid foods.   3) Please notify your doctor immediately if you have any unusual bleeding, trouble breathing, redness and pain at the surgery site, drainage, fever, or pain not relieved by medication.    4) Additional Instructions:  Please contact your physician with any problems or Same Day Surgery at [phone], Monday through Friday 6 am to 4 pm, or [place] at [facility] number at [phone].AMBULATORY SURGERY  DISCHARGE INSTRUCTIONS   5) The drugs that you were given will stay in your system until tomorrow so for the next 24 hours you should not:  D) Drive an automobile E) Make any legal  decisions F) Drink any alcoholic beverage   6) You may resume regular meals tomorrow.  Today it is better to start with liquids and gradually work up to solid foods.  You may eat anything you prefer, but it is better to start with liquids, then soup and crackers, and gradually work up to solid foods.   7) Please notify your doctor immediately if you have any unusual bleeding, trouble breathing, redness and pain at the surgery site, drainage, fever, or pain not relieved by medication.    8) Additional Instructions:        Please contact your physician with any problems or Same Day Surgery at [phone], Monday through Friday 6 am to 4 pm, or  at [facility] number at [phone].

## 2017-10-02 NOTE — Transfer of Care (Signed)
Immediate Anesthesia Transfer of Care Note  Patient: Dustin Blackburn  Procedure(s) Performed: CYSTOSCOPY WITH Bladder BIOPSY (N/A ) CYSTOSCOPY WITH RETROGRADE PYELOGRAM (Bilateral ) DIAGNOSTIC RIGHT URETEROSCOPY (Right )  Patient Location: PACU  Anesthesia Type:General  Level of Consciousness: drowsy  Airway & Oxygen Therapy: Patient Spontanous Breathing  Post-op Assessment: Report given to RN  Post vital signs: stable  Last Vitals:  Vitals Value Taken Time  BP    Temp    Pulse 81 10/02/2017  9:54 AM  Resp    SpO2 99 % 10/02/2017  9:54 AM  Vitals shown include unvalidated device data.  Last Pain:  Vitals:   10/02/17 0718  TempSrc: Tympanic  PainSc: 3          Complications: No apparent anesthesia complications

## 2017-10-02 NOTE — Op Note (Signed)
Date of procedure: 10/02/17  Preoperative diagnosis:  1. Bladder lesion 2. History of bladder cancer  Postoperative diagnosis:  1. Same as above  Procedure: 1. Cystoscopy 2. Bladder biopsy 3. Bilateral retrograde pyelogram 4. Diagnostic right ureteroscopy  Surgeon: Hollice Espy, MD  Anesthesia: General  Complications: None  Intraoperative findings: Abnormal right retrograde pyelogram with incomplete filling of calyces, ureteroscopy shows evidence of nonobstructing stones as cause of filling defect, no upper tract tumors.  Bladder with erythematous stellate scar which was biopsied minimal concern for cancer.   EBL: Minimal  Specimens: Bladder biopsy  Drains: None  Indication: Dustin Blackburn is a 77 y.o. patient with personal history of bladder cancer found to have an erythematous lesion near the previous resection site on office cystoscopy.  After reviewing the management options for treatment, he elected to proceed with the above surgical procedure(s). We have discussed the potential benefits and risks of the procedure, side effects of the proposed treatment, the likelihood of the patient achieving the goals of the procedure, and any potential problems that might occur during the procedure or recuperation. Informed consent has been obtained.  Description of procedure:  The patient was taken to the operating room and general anesthesia was induced.  The patient was placed in the dorsal lithotomy position, prepped and draped in the usual sterile fashion, and preoperative antibiotics were administered. A preoperative time-out was performed.   A 21 French scope was advanced per urethra into the bladder.  Careful inspection of the bladder revealed a stellate scar on the left posterior bladder wall which had slightly more erythema than anticipated, otherwise no other suspicious lesions.  This is less suspicious today than previously appreciated in the office.  There are no other  lesions identified within the bladder.  Ureters are unremarkable with clear reflux of urine from both.  As part of diagnostic evaluation, bilateral retrograde pyelograms were performed.  On the left, the UO was intubated using a 5 [place] open-ended ureteral catheter and gentle retrograde pyelogram was performed.  This revealed a delicate appearing ureter without hydronephrosis or filling defects.  Attention was then turned to the right ureteral orifice and the same exact procedure was performed.  The ureter itself appeared to be normal but the mid and lower pole retrograde pyelogram appeared to be grossly abnormal with small filling defects and incomplete filling of the calyces.  This was somewhat suspicious.  As a precaution, Super Stiff wire was placed up to level of the kidney.  An 8 French flexible ureteroscope was then advanced over this wire which advanced extremely easily without difficulty up to level of the upper tract.  The wire was withdrawn.  The upper tract was then completely evaluated directly visualizing each and every calyx.  There were multiple nonobstructing stones measuring up to approximately 4 mm in each of the calyces based on direct visualization estimation.  A midpole calyx was also somewhat stenotic likely resulting in the lack of filling of contrast within this calyx.  There were no tumors within the calyx which was able to be directly visualized.  The remainder of the upper tract system was unremarkable.  The scope was then backed down the length the ureter inspecting along the way.  There is no trauma or injury appreciated or other lesions within the ureter.  As such, no ureteral stent was placed.  Finally, this point in time, cold cup biopsy forceps were used to take a few small biopsies of his previous resection site.  There is  minimal to no bleeding noted.  The area was then fulgurated using Bugbee electrocautery.  Once hemostasis was adequate, the bladder was emptied and the  patient was clean and dry, repositioned in supine position, reversed by anesthesia, taken to PACU in stable condition.  There were no complications in this case.  Plan: I will call the patient with his pathology report.  Plan for cyst in 3 months.    Hollice Espy, M.D.

## 2017-10-02 NOTE — Anesthesia Post-op Follow-up Note (Signed)
Anesthesia QCDR form completed.        

## 2017-10-02 NOTE — OR Nursing (Signed)
Discussed discharge instructions with pt and wife. Both voice understanding. 

## 2017-10-03 LAB — SURGICAL PATHOLOGY

## 2017-10-04 ENCOUNTER — Telehealth: Payer: Self-pay

## 2017-10-04 NOTE — Telephone Encounter (Signed)
Pt informed, please r/s appointment for 3 months.

## 2017-10-04 NOTE — Telephone Encounter (Signed)
-----   Message from Hollice Espy, MD sent at 10/03/2017  4:51 PM EDT ----- No cancer.  Great news.  Please reschedule f/u for 3 months with cysto (cancel next week).    Hollice Espy, MD

## 2017-10-09 ENCOUNTER — Telehealth: Payer: Self-pay | Admitting: Urology

## 2017-10-09 NOTE — Telephone Encounter (Signed)
3 mos cysto appt made, pt did not want to cancel his 5/17 appt. Stated he wanted to discuss prostate issues with Dr. Erlene Quan.

## 2017-10-10 ENCOUNTER — Ambulatory Visit: Payer: Medicare HMO | Admitting: Urology

## 2017-10-10 ENCOUNTER — Ambulatory Visit: Admit: 2017-10-10 | Discharge: 2017-10-11 | Payer: MEDICARE | Attending: Internal Medicine | Primary: Internal Medicine

## 2017-10-10 DIAGNOSIS — E119 Type 2 diabetes mellitus without complications: Principal | ICD-10-CM

## 2017-10-10 DIAGNOSIS — Z125 Encounter for screening for malignant neoplasm of prostate: Secondary | ICD-10-CM

## 2017-10-10 DIAGNOSIS — N500 Atrophy of testis: Secondary | ICD-10-CM

## 2017-10-10 DIAGNOSIS — E782 Mixed hyperlipidemia: Secondary | ICD-10-CM

## 2017-10-10 MED ORDER — INSULIN NPH ISOPHANE U-100 HUMAN 100 UNIT/ML (3 ML) SUBCUTANEOUS PEN
12 refills | 0 days | Status: CP
Start: 2017-10-10 — End: ?

## 2017-10-12 ENCOUNTER — Ambulatory Visit (INDEPENDENT_AMBULATORY_CARE_PROVIDER_SITE_OTHER): Payer: Medicare HMO | Admitting: Urology

## 2017-10-12 ENCOUNTER — Encounter: Payer: Self-pay | Admitting: Urology

## 2017-10-12 VITALS — BP 131/83 | HR 84 | Ht 70.0 in | Wt 210.0 lb

## 2017-10-12 DIAGNOSIS — R350 Frequency of micturition: Secondary | ICD-10-CM | POA: Diagnosis not present

## 2017-10-12 DIAGNOSIS — Z8551 Personal history of malignant neoplasm of bladder: Secondary | ICD-10-CM | POA: Diagnosis not present

## 2017-10-12 LAB — BLADDER SCAN AMB NON-IMAGING

## 2017-10-12 NOTE — Progress Notes (Signed)
10/12/2017 8:41 AM   Dustin Blackburn [birth year] [id number]  Referring provider: Ricardo Jericho, NP [address] [place], [place] [address]  Chief Complaint  Patient presents with  . 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  He was initially found to have incidental bladder wall thickening on CT abdomen pelvis on 03/2016. Cystoscopy showed a papillary proximal and 1 cm tumor at the dome of the bladder. He was taken to the operating room on 06/12/2016 for TURBT, bilateral retrograde, and insufflation mitomycin.  Surgical pathology was consistent with low-grade, noninvasive tumor.  Recently, he returned to the operating room on 09/2017 for bladder biopsy due to a suspicious erythematous area.  This was found to be benign.   He reports that his prostate is enlarged both by his gastroenterologist Dustin Blackburn recent colonoscopy as well as by his primary care physician on cystoscopy.  He is worried about this because he is currently on testosterone replacement therapy managed by his primary care physician.  In terms of symptoms, he does have some urinary frequency but notes that he drinks a lot.  He has urinary symptoms.  IPSS as below.  Minimal obstructive adequate bladder emptying today as well.  Most recent PSA 0.79 on 10/10/17.   This is been stable for many years.   IPSS    Row Name 10/12/17 0800         International Prostate Symptom Score   How often have you had to urinate less than every two hours?  Less than half the time     How often have you found you stopped and started again several times when you urinated?  Less than half the time     How often have you found it difficult to postpone urination?  More than half the time     How often have you had a weak urinary stream?  Less than half the time     How often have you had to strain to start urination?  Less than 1 in 5 times      How many times did you typically get up at night to urinate?  1 Time     Total IPSS Score  12       Quality of Life due to urinary symptoms   If you were to spend the rest of your life with your urinary condition just the way it is now how would you feel about that?  Mostly Disatisfied        Score:  1-7 Mild 8-19 Moderate 20-35 Severe   PMH: Past Medical History:  Diagnosis Date  . Arthritis   . Asthma   . Cancer ([place]) 2018   bladder  . Cold   . COPD (chronic obstructive pulmonary disease) ([place])   . Coronary artery disease   . DDD (degenerative disc disease), cervical   . Diabetes mellitus without complication ([place])   . Dyspnea   . GERD (gastroesophageal reflux disease)   . Hearing loss associated with syndrome of both ears   . Heart attack ([place]) 2016  . History of aspiration pneumonitis   . History of kidney stones   . Hyperlipidemia   . Kidney cysts   . Kidney stones   . Osteoarthritis of back   . Psoriasis   . Psoriasis   . Seasonal allergies     Surgical History: Past Surgical History:  Procedure Laterality Date  .  CARDIAC CATHETERIZATION  2016 and 2018  . COLONOSCOPY    . COLONOSCOPY WITH PROPOFOL N/A 09/18/2017   Procedure: COLONOSCOPY WITH PROPOFOL;  Surgeon: Lollie Sails, MD;  Location: [facility] ENDOSCOPY;  Service: Endoscopy;  Laterality: N/A;  . CORONARY ANGIOPLASTY  2016  . CORONARY STENT PLACEMENT  2016  . CYSTOSCOPY W/ RETROGRADES Bilateral 06/12/2016   Procedure: CYSTOSCOPY WITH RETROGRADE PYELOGRAM;  Surgeon: Hollice Espy, MD;  Location: [facility] ORS;  Service: Urology;  Laterality: Bilateral;  . CYSTOSCOPY W/ RETROGRADES Bilateral 10/02/2017   Procedure: CYSTOSCOPY WITH RETROGRADE PYELOGRAM;  Surgeon: Hollice Espy, MD;  Location: [facility] ORS;  Service: Urology;  Laterality: Bilateral;  . CYSTOSCOPY WITH BIOPSY N/A 10/02/2017   Procedure: CYSTOSCOPY WITH Bladder BIOPSY;  Surgeon: Hollice Espy, MD;  Location: [facility] ORS;  Service: Urology;   Laterality: N/A;  . CYSTOSCOPY WITH URETEROSCOPY Right 10/02/2017   Procedure: DIAGNOSTIC RIGHT URETEROSCOPY;  Surgeon: Hollice Espy, MD;  Location: [facility] ORS;  Service: Urology;  Laterality: Right;  . ESOPHAGOGASTRODUODENOSCOPY (EGD) WITH PROPOFOL N/A 09/18/2017   Procedure: ESOPHAGOGASTRODUODENOSCOPY (EGD) WITH PROPOFOL;  Surgeon: Lollie Sails, MD;  Location[facility] ENDOSCOPY;  Service: Endoscopy;  Laterality: N/A;  . fatty tumors removed    . HIATAL HERNIA REPAIR  1992  . LIPOMA EXCISION     right leg  . RIGHT/LEFT HEART CATH AND CORONARY ANGIOGRAPHY N/A 12/21/2016   Procedure: Right/Left Heart Cath and Coronary Angiography;  Surgeon: Isaias Cowman, MD;  Location: [facility];  Service: Cardiovascular;  Laterality: N/A;  . SINUS EXPLORATION  1992  . TRANSURETHRAL RESECTION OF BLADDER TUMOR WITH MITOMYCIN-C N/A 06/12/2016   Procedure: TRANSURETHRAL RESECTION OF BLADDER TUMOR WITH MITOMYCIN-C;  Surgeon: Hollice Espy, MD;  Location: [facility] ORS;  Service: Urology;  Laterality: N/A;  . UMBILICAL HERNIA REPAIR N/A 06/12/2016   Procedure: HERNIA REPAIR UMBILICAL ADULT;  Surgeon: Christene Lye, MD;  Location: [facility] ORS;  Service: General;  Laterality: N/A;  . URETEROSCOPY Right 06/12/2016   Procedure: URETEROSCOPY;  Surgeon: Hollice Espy, MD;  Location: [facility] ORS;  Service: Urology;  Laterality: Right;    Home Medications:  Allergies as of 10/12/2017      Reactions   Actos [pioglitazone] Palpitations   Pt reported a MI    Glimepiride Rash   Glipizide Shortness Of Breath   Metformin Shortness Of Breath   Pt states he "can't breathe and it gives him sinus congestion".   Amoxicillin Nausea Only   Erythromycin Base Nausea Only   Esomeprazole Magnesium Other (See Comments)   Makes patients heart burn worse   Lipitor [atorvastatin]    indigestion   Liraglutide Other (See Comments)   bloating   Penicillins Nausea Only   Has patient had a PCN reaction causing immediate  rash, facial/tongue/throat swelling, SOB or lightheadedness with hypotension: no Has patient had a PCN reaction causing severe rash involving mucus membranes or skin necrosis: no Has patient had a PCN reaction that required hospitalization:no Has patient had a PCN reaction occurring within the last 10 years: no If all of the above answers are "NO", then may proceed with Cephalosporin use.   Sitagliptin Other (See Comments)   Pt states it "gives him joint pain" & redness      Medication List        Accurate as of 10/12/17  8:41 AM. Always use your most recent med list.          albuterol (2.5 MG/3ML) 0.083% nebulizer solution Commonly known as:  PROVENTIL Take 2.5 mg  by nebulization every 4 (four) hours as needed for wheezing or shortness of breath.   ANDROGEL 50 MG/5GM (1%) Gel Generic drug:  testosterone Place 5 g onto the skin daily.   aspirin EC 81 MG tablet Take 81 mg by mouth daily.   B-12 PO Take 10 drops by mouth daily.   budesonide-formoterol 160-4.5 MCG/ACT inhaler Commonly known as:  SYMBICORT Inhale 2 puffs into the lungs 2 (two) times daily.   fluocinonide 0.05 % external solution Commonly known as:  LIDEX Apply 1 application topically 2 (two) times daily as needed (psoriasis).   insulin NPH Human 100 UNIT/ML injection Commonly known as:  HUMULIN N,NOVOLIN N Inject 10 Units into the skin 2 (two) times daily.   lisinopril 2.5 MG tablet Commonly known as:  PRINIVIL,ZESTRIL Take by mouth.   montelukast 10 MG tablet Commonly known as:  SINGULAIR Take 10 mg by mouth at bedtime.   NON FORMULARY Take 2 capsules by mouth 2 (two) times daily.   predniSONE 20 MG tablet Commonly known as:  DELTASONE   RELION PEN NEEDLES 31G X 6 MM Misc Generic drug:  Insulin Pen Needle   rosuvastatin 5 MG tablet Commonly known as:  CRESTOR Take once a week and each week add a day as long as you tolerate it.   SPIRIVA HANDIHALER 18 MCG inhalation capsule Generic drug:   tiotropium Place into inhaler and inhale.   sucralfate 1 g tablet Commonly known as:  CARAFATE Take 1 g by mouth 2 (two) times daily.   triamcinolone cream 0.1 % Commonly known as:  KENALOG Apply 1 application topically 2 (two) times daily as needed (psoriasis).       Allergies:  Allergies  Allergen Reactions  . Actos [Pioglitazone] Palpitations    Pt reported a MI   . Glimepiride Rash  . Glipizide Shortness Of Breath  . Metformin Shortness Of Breath    Pt states he "can't breathe and it gives him sinus congestion".   . Amoxicillin Nausea Only  . Erythromycin Base Nausea Only  . Esomeprazole Magnesium Other (See Comments)    Makes patients heart burn worse  . Lipitor [Atorvastatin]     indigestion  . Liraglutide Other (See Comments)    bloating  . Penicillins Nausea Only    Has patient had a PCN reaction causing immediate rash, facial/tongue/throat swelling, SOB or lightheadedness with hypotension: no Has patient had a PCN reaction causing severe rash involving mucus membranes or skin necrosis: no Has patient had a PCN reaction that required hospitalization:no Has patient had a PCN reaction occurring within the last 10 years: no If all of the above answers are "NO", then may proceed with Cephalosporin use.   . Sitagliptin Other (See Comments)    Pt states it "gives him joint pain" & redness     Family History: Family History  Problem Relation Age of Onset  . Cancer Mother   . Prostate cancer Maternal Uncle   . Prostate cancer Paternal Uncle   . Prostate cancer Cousin   . Congestive Heart Failure Father   . Kidney cancer Neg Hx   . Bladder Cancer Neg Hx     Social History:  reports that he quit smoking about 27 years ago. His smoking use included cigarettes. He quit after 30.00 years of use. He has quit using smokeless tobacco. His smokeless tobacco use included chew. He reports that he drinks alcohol. He reports that he does not use  drugs.  ROS: UROLOGY Frequent Urination?: Yes  Hard to postpone urination?: No Burning/pain with urination?: No Get up at night to urinate?: Yes Leakage of urine?: No Urine stream starts and stops?: No Trouble starting stream?: No Do you have to strain to urinate?: No Blood in urine?: No Urinary tract infection?: No Sexually transmitted disease?: No Injury to kidneys or bladder?: No Painful intercourse?: No Weak stream?: Yes Erection problems?: No Penile pain?: No  Gastrointestinal Nausea?: No Vomiting?: No Indigestion/heartburn?: No Diarrhea?: No Constipation?: No  Constitutional Fever: No Night sweats?: No Weight loss?: No Fatigue?: No  Skin Skin rash/lesions?: No Itching?: No  Eyes Blurred vision?: No Double vision?: No  Ears/Nose/Throat Sore throat?: Yes Sinus problems?: No  Hematologic/Lymphatic Swollen glands?: No Easy bruising?: No  Cardiovascular Leg swelling?: No Chest pain?: No  Respiratory Cough?: Yes Shortness of breath?: Yes  Endocrine Excessive thirst?: Yes  Musculoskeletal Back pain?: No Joint pain?: No  Neurological Headaches?: Yes Dizziness?: No  Psychologic Depression?: No Anxiety?: No  Physical Exam: BP 131/83   Pulse 84   Ht 5\' 10"  (1.778 m)   Wt 210 lb (95.3 kg)   BMI 30.13 kg/m   Constitutional:  Alert and oriented, No acute distress. HEENT: [place] AT, moist mucus membranes.  Trachea midline, no masses. Cardiovascular: No clubbing, cyanosis, or edema. Respiratory: Normal respiratory effort, no increased work of breathing. GI: Abdomen is soft, nontender, nondistended, no abdominal masses Rectal: Normal sphincter tone. 40 cc prostate, rubbery nodules with no induration.   Skin: No rashes, bruises or suspicious lesions. Neurologic: Grossly intact, no focal deficits, moving all 4 extremities. Psychiatric: Normal mood and affect.  Laboratory Data: Lab Results  Component Value Date   WBC 10.5 06/02/2016   HGB  15.8 06/02/2016   HCT 46.5 06/02/2016   MCV 91.4 06/02/2016   PLT 260 06/02/2016    Lab Results  Component Value Date   CREATININE 0.80 06/02/2016    Urinalysis    Component Value Date/Time   COLORURINE YELLOW 09/21/2017 1325   APPEARANCEUR CLEAR 09/21/2017 1325   LABSPEC 1.025 09/21/2017 1325   PHURINE 5.0 09/21/2017 1325   GLUCOSEU 250 (A) 09/21/2017 1325   HGBUR NEGATIVE 09/21/2017 1325   BILIRUBINUR NEGATIVE 09/21/2017 1325   KETONESUR 15 (A) 09/21/2017 1325   PROTEINUR NEGATIVE 09/21/2017 1325   NITRITE NEGATIVE 09/21/2017 1325   LEUKOCYTESUR NEGATIVE 09/21/2017 1325    Lab Results  Component Value Date   BACTERIA RARE (A) 09/21/2017    Pertinent Imaging: Results for orders placed or performed in visit on 10/12/17  BLADDER SCAN AMB NON-IMAGING  Result Value Ref Range   Scan Result 75ml     Assessment & Plan:     1. Urinary frequency Mildly enlarged prostate on exam today with minimal obstructive voiding symptoms We discussed her urinary symptoms which may be related to his fluid intake rather than secondary to prostatic obstruction Offered trial of Flomax, declined, prefers behavioral modification at this time - BLADDER SCAN AMB NON-IMAGING  2. History of bladder cancer Most recent biopsy benign  Return for surveillance cystoscopy in 3 months as scheduled  Hollice Espy, MD  [facility] [address], [place],  [address] [phone]

## 2017-10-18 NOTE — Progress Notes (Signed)
[facility] Consultation      Assessment and Plan:  COPD with acute exacerbation, acute bronchitis. - Patient is Dustin Blackburn received multiple courses of steroids and antibiotics.  I suspect that he has postinfectious cough with residual symptoms of his recent COPD exacerbation/acute bronchitis. - He has not been using Symbicort regularly because of its cost.  I am giving him a prescription for generic Advair he is instructed to use it twice daily.  I also want him to use his nebulizer twice daily.  Discussed the importance of using his medications regularly, he is at high risk of recurrent exacerbation.  Allergic rhinitis. - May be contributing to bronchitis, as his exacerbations seem to be triggered by allergies and sinus drainage. - He is asked to use Flonase 2 sprays in each nostril once daily.  GERD. -May be contributing to respiratory disease.  Continue current therapy.   Orders Placed This Encounter  Procedures  . DG Chest 2 View   Meds ordered this encounter  Medications  . fluticasone-salmeterol (ADVAIR HFA) 115-21 MCG/ACT inhaler    Sig: Inhale 2 puffs into the lungs 2 (two) times daily. Rinse mouth after use.    Dispense:  1 Inhaler    Refill:  12  . ipratropium-albuterol (DUONEB) 0.5-2.5 (3) MG/3ML SOLN    Sig: Take 3 mLs by nebulization 2 (two) times daily.    Dispense:  360 mL    Refill:  1   Return in about 2 months (around 12/19/2017).   Date: 10/19/2017  MRN# [id number] Dustin Blackburn [birth year]  Referring Physician:   LABRANDON Blackburn is a 77 y.o. old male seen in consultation for chief complaint of:    Chief Complaint  Patient presents with  . Consult    COPD: Pt dx'd around 1992. Pt questions if he has lung infection going on-cant get rid of. Cough-productive-yellow and thick, SOB and wheezing as well. Sinus drainage as well-clear in color. Denies any fever or chills.     HPI:   The patient is a 77 year old male whose history includes  empty sella syndrome, GERD, COPD with exacerbation. He has been diagnosed with COPD, he takes albuterol MDI several days per week, he does his nebs 3 days per week, once per day on those days and seems to help. He takes symbicort occasionally.  He tells me about 2 months ago when pollen hit. He started with cough, sore throat like a cold, he has been coughing up yellow stuff. The sore throat went away and the cough persisted. He got 2 courses of abx, and 2 rounds of prednisone and feels like it helped.  He used to have severe GERD and tells me that his lung was damaged because of it.   Before this came on his breathing was pretty good, he did not have to use symbicort, he did not really need to use his nebs or any inhalers. He last smoked 25 yrs ago.  [place]  He does have sinus drainage, he does not take nasal spray.   PMHX:   Past Medical History:  Diagnosis Date  . Arthritis   . Asthma   . Cancer ([place]) 2018   bladder  . Cold   . COPD (chronic obstructive pulmonary disease) ([place])   . Coronary artery disease   . DDD (degenerative disc disease), cervical   . Diabetes mellitus without complication ([place])   . Dyspnea   . GERD (gastroesophageal reflux disease)   .  Hearing loss associated with syndrome of both ears   . Heart attack ([place]) 2016  . History of aspiration pneumonitis   . History of kidney stones   . Hyperlipidemia   . Kidney cysts   . Kidney stones   . Osteoarthritis of back   . Psoriasis   . Psoriasis   . Seasonal allergies    Surgical Hx:  Past Surgical History:  Procedure Laterality Date  . CARDIAC CATHETERIZATION  2016 and 2018  . COLONOSCOPY    . COLONOSCOPY WITH PROPOFOL N/A 09/18/2017   Procedure: COLONOSCOPY WITH PROPOFOL;  Surgeon: Lollie Sails, MD;  Location: [facility] ENDOSCOPY;  Service: Endoscopy;  Laterality: N/A;  . CORONARY ANGIOPLASTY  2016  . CORONARY STENT PLACEMENT  2016  . CYSTOSCOPY W/ RETROGRADES Bilateral  06/12/2016   Procedure: CYSTOSCOPY WITH RETROGRADE PYELOGRAM;  Surgeon: Hollice Espy, MD;  Location: [facility] ORS;  Service: Urology;  Laterality: Bilateral;  . CYSTOSCOPY W/ RETROGRADES Bilateral 10/02/2017   Procedure: CYSTOSCOPY WITH RETROGRADE PYELOGRAM;  Surgeon: Hollice Espy, MD;  Location: [facility] ORS;  Service: Urology;  Laterality: Bilateral;  . CYSTOSCOPY WITH BIOPSY N/A 10/02/2017   Procedure: CYSTOSCOPY WITH Bladder BIOPSY;  Surgeon: Hollice Espy, MD;  Location: [facility] ORS;  Service: Urology;  Laterality: N/A;  . CYSTOSCOPY WITH URETEROSCOPY Right 10/02/2017   Procedure: DIAGNOSTIC RIGHT URETEROSCOPY;  Surgeon: Hollice Espy, MD;  Location: [facility] ORS;  Service: Urology;  Laterality: Right;  . ESOPHAGOGASTRODUODENOSCOPY (EGD) WITH PROPOFOL N/A 09/18/2017   Procedure: ESOPHAGOGASTRODUODENOSCOPY (EGD) WITH PROPOFOL;  Surgeon: Lollie Sails, MD;  Location: [facility] ENDOSCOPY;  Service: Endoscopy;  Laterality: N/A;  . fatty tumors removed    . HIATAL HERNIA REPAIR  1992  . LIPOMA EXCISION     right leg  . RIGHT/LEFT HEART CATH AND CORONARY ANGIOGRAPHY N/A 12/21/2016   Procedure: Right/Left Heart Cath and Coronary Angiography;  Surgeon: Isaias Cowman, MD;  Location: [facility] LAB;  Service: Cardiovascular;  Laterality: N/A;  . SINUS EXPLORATION  1992  . TRANSURETHRAL RESECTION OF BLADDER TUMOR WITH MITOMYCIN-C N/A 06/12/2016   Procedure: TRANSURETHRAL RESECTION OF BLADDER TUMOR WITH MITOMYCIN-C;  Surgeon: Hollice Espy, MD;  Location: [facility] ORS;  Service: Urology;  Laterality: N/A;  . UMBILICAL HERNIA REPAIR N/A 06/12/2016   Procedure: HERNIA REPAIR UMBILICAL ADULT;  Surgeon: Christene Lye, MD;  Location: [facility] ORS;  Service: General;  Laterality: N/A;  . URETEROSCOPY Right 06/12/2016   Procedure: URETEROSCOPY;  Surgeon: Hollice Espy, MD;  Location: [facility] ORS;  Service: Urology;  Laterality: Right;   Family Hx:  Family History  Problem Relation Age of Onset  . Cancer  Mother   . Prostate cancer Maternal Uncle   . Prostate cancer Paternal Uncle   . Prostate cancer Cousin   . Congestive Heart Failure Father   . Kidney cancer Neg Hx   . Bladder Cancer Neg Hx    Social Hx:   Social History   Tobacco Use  . Smoking status: Former Smoker    Years: 30.00    Types: Cigarettes    Last attempt to quit: 05/29/1990    Years since quitting: 27.4  . Smokeless tobacco: Former Systems developer    Types: Chew  Substance Use Topics  . Alcohol use: Yes    Comment: rarely  . Drug use: No   Medication:    Current Outpatient Medications:  .  albuterol (PROVENTIL) (2.5 MG/3ML) 0.083% nebulizer solution, Take 2.5 mg by nebulization every 4 (four) hours as needed for wheezing or shortness of  breath. , Disp: , Rfl:  .  aspirin EC 81 MG tablet, Take 81 mg by mouth daily., Disp: , Rfl:  .  budesonide-formoterol (SYMBICORT) 160-4.5 MCG/ACT inhaler, Inhale 2 puffs into the lungs 2 (two) times daily., Disp: , Rfl:  .  Cyanocobalamin (B-12 PO), Take 10 drops by mouth daily., Disp: , Rfl:  .  fluocinonide (LIDEX) 0.05 % external solution, Apply 1 application topically 2 (two) times daily as needed (psoriasis). , Disp: , Rfl:  .  insulin NPH Human (HUMULIN N,NOVOLIN N) 100 UNIT/ML injection, Inject 10 Units into the skin 2 (two) times daily. , Disp: , Rfl:  .  montelukast (SINGULAIR) 10 MG tablet, Take 10 mg by mouth at bedtime. , Disp: , Rfl:  .  NON FORMULARY, Take 2 capsules by mouth 2 (two) times daily., Disp: , Rfl:  .  predniSONE (DELTASONE) 20 MG tablet, Take 20mg  by mouth BID, Disp: , Rfl:  .  RELION PEN NEEDLES 31G X 6 MM MISC, , Disp: , Rfl:  .  sucralfate (CARAFATE) 1 g tablet, Take 1 g by mouth 2 (two) times daily., Disp: , Rfl:  .  testosterone (ANDROGEL) 50 MG/5GM (1%) GEL, Place 5 g onto the skin daily. , Disp: , Rfl:  .  triamcinolone cream (KENALOG) 0.1 %, Apply 1 application topically 2 (two) times daily as needed (psoriasis). , Disp: , Rfl:  .  tiotropium (SPIRIVA  HANDIHALER) 18 MCG inhalation capsule, Place into inhaler and inhale., Disp: , Rfl:    Allergies:  Actos [pioglitazone]; Glimepiride; Glipizide; Metformin; Amoxicillin; Erythromycin base; Esomeprazole magnesium; Lipitor [atorvastatin]; Liraglutide; Penicillins; and Sitagliptin  Review of Systems: Gen:  Denies  fever, sweats, chills HEENT: Denies blurred vision, double vision. bleeds, sore throat Cvc:  No dizziness, chest pain. Resp:   Denies cough or sputum production, shortness of breath Gi: Denies swallowing difficulty, stomach pain. Gu:  Denies bladder incontinence, burning urine Ext:   No Joint pain, stiffness. Skin: No skin rash,  hives  Endoc:  No polyuria, polydipsia. Psych: No depression, insomnia. Other:  All other systems were reviewed with the patient and were negative other that what is mentioned in the HPI.   Physical Examination:   VS: BP 112/78 (BP Location: Left Arm)   Pulse 66   Ht 5\' 10"  (1.778 m)   Wt 217 lb (98.4 kg)   SpO2 100%   BMI 31.14 kg/m   General Appearance: No distress  Neuro:without focal findings,  speech normal,  HEENT: PERRLA, EOM intact.   Pulmonary: normal breath sounds, No wheezing.  CardiovascularNormal S1,S2.  No m/r/g.   Abdomen: Benign, Soft, non-tender. Renal:  No costovertebral tenderness  GU:  No performed at this time. Endoc: No evident thyromegaly, no signs of acromegaly. Skin:   warm, no rashes, no ecchymosis  Extremities: normal, no cyanosis, clubbing.  Other findings:    LABORATORY PANEL:   CBC No results for input(s): WBC, HGB, HCT, PLT in the last 168 hours. ------------------------------------------------------------------------------------------------------------------  Chemistries  No results for input(s): NA, K, CL, CO2, GLUCOSE, BUN, CREATININE, CALCIUM, MG, AST, ALT, ALKPHOS, BILITOT in the last 168 hours.  Invalid input(s):  GFRCGP ------------------------------------------------------------------------------------------------------------------  Cardiac Enzymes No results for input(s): TROPONINI in the last 168 hours. ------------------------------------------------------------  RADIOLOGY:  No results found.     Thank  you for the consultation and for allowing [facility] to assist in the care of your patient. Our recommendations are noted above.  Please contact us if we can be  of further service.   Marda Stalker, MD.  Board Certified in Internal Medicine, Pulmonary Medicine, [place], and Sleep Medicine.  [facility] Office Number: [phone]  Patricia Pesa, M.D.  Merton Border, M.D  10/19/2017

## 2017-10-19 ENCOUNTER — Ambulatory Visit
Admission: RE | Admit: 2017-10-19 | Discharge: 2017-10-19 | Disposition: A | Discharge status: Home / Self Care | Payer: Medicare HMO | Source: Ambulatory Visit | Attending: Internal Medicine | Admitting: Internal Medicine

## 2017-10-19 ENCOUNTER — Encounter: Payer: Self-pay | Admitting: Internal Medicine

## 2017-10-19 ENCOUNTER — Ambulatory Visit: Payer: Medicare HMO | Admitting: Internal Medicine

## 2017-10-19 VITALS — BP 112/78 | HR 66 | Ht 70.0 in | Wt 217.0 lb

## 2017-10-19 DIAGNOSIS — J440 Chronic obstructive pulmonary disease with acute lower respiratory infection: Secondary | ICD-10-CM

## 2017-10-19 DIAGNOSIS — R0600 Dyspnea, unspecified: Secondary | ICD-10-CM | POA: Insufficient documentation

## 2017-10-19 MED ORDER — FLUTICASONE-SALMETEROL 115-21 MCG/ACT IN AERO: 2.0000 | INHALATION_SPRAY | Freq: Two times a day (BID) | RESPIRATORY_TRACT | 12 refills | Status: DC

## 2017-10-19 MED ORDER — IPRATROPIUM-ALBUTEROL 0.5-2.5 (3) MG/3ML IN SOLN: 3.0000 mL | Freq: Two times a day (BID) | RESPIRATORY_TRACT | 1 refills | Status: DC

## 2017-10-19 NOTE — Patient Instructions (Signed)
Stop symbicort, start advair, 2 puff twice daily, rinse mouth after use.  Use nebulizer twice daily.  Will check chest ray.  Use flonase 2 sprays in each nostril once daily.

## 2017-10-23 MED ORDER — TESTOSTERONE 1 % (50 MG/5 GRAM) TRANSDERMAL GEL PACKET
Freq: Every day | TRANSDERMAL | 1 refills | 0.00000 days | Status: CP
Start: 2017-10-23 — End: 2017-12-25

## 2017-12-18 NOTE — Progress Notes (Signed)
[facility]     Assessment and Plan:  COPD with acute exacerbation, acute bronchitis. - Now doing better, recommended that he use advair at least once per day.  --Increase to twice daily if he feels something coming on.   Allergic rhinitis. - Can use flonase during allergy season.   GERD. -May be contributing to respiratory disease.   --Continue to follow up with gastroenterology.   Return in about 1 year (around 12/20/2018).   Date: 12/18/2017  MRN# [id number] Dustin Blackburn 04-19-1941    Dustin Blackburn is a 77 y.o. old male seen in consultation for chief complaint of:    Chief Complaint  Patient presents with  . COPD    pt here for 2 mos f/u cxr 09/2017, he was started on Advair last visit.  [place] Kitchen Cough    per patient resolved, patient is still on Spiriva/Advair.    HPI:   The patient is a 77 year old male whose history includes empty sella syndrome, GERD, COPD with exacerbation.  Last visit he was having a persistent cough after COPD exacerbation, thought to represent postinfectious cough.  It was also thought that he had allergies which may be contributing.  He was started on Advair, nebulized albuterol twice daily, Flonase.  Since his last visit he feels that he is doing better. The cough is resolved. He feels that his breathing is doing well. He is using advair "once in a while". He uses nebulizer about three times per week, and not using flonase, he denies sinus drainage.     Social Hx:   Social History   Tobacco Use  . Smoking status: Former Smoker    Years: 30.00    Types: Cigarettes    Last attempt to quit: 05/29/1990    Years since quitting: 27.5  . Smokeless tobacco: Former Systems developer    Types: Chew  Substance Use Topics  . Alcohol use: Yes    Comment: rarely  . Drug use: No   Medication:    Current Outpatient Medications:  .  albuterol (PROVENTIL) (2.5 MG/3ML) 0.083% nebulizer solution, Take 2.5 mg by nebulization every 4 (four) hours as  needed for wheezing or shortness of breath. , Disp: , Rfl:  .  aspirin EC 81 MG tablet, Take 81 mg by mouth daily., Disp: , Rfl:  .  budesonide-formoterol (SYMBICORT) 160-4.5 MCG/ACT inhaler, Inhale 2 puffs into the lungs 2 (two) times daily., Disp: , Rfl:  .  Cyanocobalamin (B-12 PO), Take 10 drops by mouth daily., Disp: , Rfl:  .  fluocinonide (LIDEX) 0.05 % external solution, Apply 1 application topically 2 (two) times daily as needed (psoriasis). , Disp: , Rfl:  .  fluticasone-salmeterol (ADVAIR HFA) 115-21 MCG/ACT inhaler, Inhale 2 puffs into the lungs 2 (two) times daily. Rinse mouth after use., Disp: 1 Inhaler, Rfl: 12 .  insulin NPH Human (HUMULIN N,NOVOLIN N) 100 UNIT/ML injection, Inject 10 Units into the skin 2 (two) times daily. , Disp: , Rfl:  .  ipratropium-albuterol (DUONEB) 0.5-2.5 (3) MG/3ML SOLN, Take 3 mLs by nebulization 2 (two) times daily., Disp: 360 mL, Rfl: 1 .  montelukast (SINGULAIR) 10 MG tablet, Take 10 mg by mouth at bedtime. , Disp: , Rfl:  .  NON FORMULARY, Take 2 capsules by mouth 2 (two) times daily., Disp: , Rfl:  .  predniSONE (DELTASONE) 20 MG tablet, Take 20mg  by mouth BID, Disp: , Rfl:  .  RELION PEN NEEDLES 31G X 6 MM MISC, , Disp: , Rfl:  .  sucralfate (CARAFATE) 1 g tablet, Take 1 g by mouth 2 (two) times daily., Disp: , Rfl:  .  testosterone (ANDROGEL) 50 MG/5GM (1%) GEL, Place 5 g onto the skin daily. , Disp: , Rfl:  .  tiotropium (SPIRIVA HANDIHALER) 18 MCG inhalation capsule, Place into inhaler and inhale., Disp: , Rfl:  .  triamcinolone cream (KENALOG) 0.1 %, Apply 1 application topically 2 (two) times daily as needed (psoriasis). , Disp: , Rfl:    Allergies:  Actos [pioglitazone]; Glimepiride; Glipizide; Metformin; Amoxicillin; Erythromycin base; Esomeprazole magnesium; Lipitor [atorvastatin]; Liraglutide; Penicillins; and Sitagliptin  Review of Systems:  Constitutional: Feels well. Cardiovascular: No chest pain.  Pulmonary: Denies dyspnea.     The remainder of systems were reviewed and were found to be negative other than what is documented in the HPI.    Physical Examination:   VS: BP 128/78 (BP Location: Left Arm, Cuff Size: Large)   Pulse 81   Resp 16   Ht 5\' 10"  (1.778 m)   Wt 217 lb (98.4 kg)   SpO2 94%   BMI 31.14 kg/m   General Appearance: No distress  Neuro:without focal findings, mental status, speech normal, alert and oriented HEENT: PERRLA, EOM intact Pulmonary: No wheezing, No rales  CardiovascularNormal S1,S2.  No m/r/g.  Abdomen: Benign, Soft, non-tender, No masses Renal:  No costovertebral tenderness  GU:  No performed at this time. Endoc: No evident thyromegaly, no signs of acromegaly or Cushing features Skin:   warm, no rashes, no ecchymosis  Extremities: normal, no cyanosis, clubbing.      LABORATORY PANEL:   CBC No results for input(s): WBC, HGB, HCT, PLT in the last 168 hours. ------------------------------------------------------------------------------------------------------------------  Chemistries  No results for input(s): NA, K, CL, CO2, GLUCOSE, BUN, CREATININE, CALCIUM, MG, AST, ALT, ALKPHOS, BILITOT in the last 168 hours.  Invalid input(s): GFRCGP ------------------------------------------------------------------------------------------------------------------  Cardiac Enzymes No results for input(s): TROPONINI in the last 168 hours. ------------------------------------------------------------  RADIOLOGY:  No results found.     Thank  you for the consultation and for allowing [facility] to assist in the care of your patient. Our recommendations are noted above.  Please contact us if we can be of further service.  Marda Stalker, M.D., F.C.C.P.  Board Certified in Internal Medicine, Pulmonary Medicine, [place], and Sleep Medicine.  [facility] Office Number: [phone]   12/18/2017

## 2017-12-19 ENCOUNTER — Encounter: Payer: Self-pay | Admitting: Internal Medicine

## 2017-12-19 ENCOUNTER — Ambulatory Visit: Payer: Medicare HMO | Admitting: Internal Medicine

## 2017-12-19 VITALS — BP 128/78 | HR 81 | Resp 16 | Ht 70.0 in | Wt 217.0 lb

## 2017-12-19 DIAGNOSIS — J449 Chronic obstructive pulmonary disease, unspecified: Secondary | ICD-10-CM

## 2017-12-19 NOTE — Patient Instructions (Addendum)
Use advair once daily. Increase to twice daily if you feel something coming on.

## 2017-12-25 MED ORDER — TESTOSTERONE 1 % (50 MG/5 GRAM) TRANSDERMAL GEL PACKET
0 refills | 0 days | Status: CP
Start: 2017-12-25 — End: 2018-01-11

## 2018-01-01 ENCOUNTER — Encounter: Payer: Self-pay | Admitting: Urology

## 2018-01-01 ENCOUNTER — Ambulatory Visit: Payer: Medicare HMO | Admitting: Urology

## 2018-01-01 VITALS — BP 146/88 | HR 76 | Ht 70.0 in | Wt 210.0 lb

## 2018-01-01 DIAGNOSIS — Z8551 Personal history of malignant neoplasm of bladder: Secondary | ICD-10-CM | POA: Diagnosis not present

## 2018-01-01 LAB — MICROSCOPIC EXAMINATION
Bacteria, UA: NONE SEEN
Epithelial Cells (non renal): NONE SEEN /hpf (ref 0–10)
WBC, UA: NONE SEEN /hpf (ref 0–5)

## 2018-01-01 LAB — URINALYSIS, COMPLETE
Bilirubin, UA: NEGATIVE
Glucose, UA: NEGATIVE
Ketones, UA: NEGATIVE
Nitrite, UA: NEGATIVE
Protein, UA: NEGATIVE
Specific Gravity, UA: 1.02 (ref 1.005–1.030)
Urobilinogen, Ur: 0.2 mg/dL (ref 0.2–1.0)
pH, UA: 6.5 (ref 5.0–7.5)

## 2018-01-01 MED ORDER — CIPROFLOXACIN HCL 500 MG PO TABS
500.0000 mg | ORAL_TABLET | Freq: Once | ORAL | Status: AC
  Administered 2018-01-01: 500 mg via ORAL

## 2018-01-01 MED ORDER — LIDOCAINE HCL URETHRAL/MUCOSAL 2 % EX GEL
1.0000 "application " | Freq: Once | CUTANEOUS | Status: AC
  Administered 2018-01-01: 1 via URETHRAL

## 2018-01-01 NOTE — Progress Notes (Signed)
   01/01/18  CC:  Chief Complaint  Patient presents with  . Cysto    HPI: 77 year-old male with a history of low-grade noninvasive bladder cancer returns today for office cystoscopy.  He was initially found to have incidental bladder wall thickening on CT abdomen pelvis on 03/2016. Cystoscopy showed a papillary proximal and 1 cm tumor at the dome of the bladder. He was taken to the operating room on 06/12/2016 for TURBT, bilateral retrograde, and instillation of mitomycin.  Surgical pathology was consistent with low-grade, noninvasive tumor.  More recently, he returned to the operating room on 10/02/2017 for cystoscopy, bladder biopsy, bilateral retrograde pyelograms secondary to bladder erythema.  Surgical pathology was benign with evidence of squamous metaplasia and reactive changes, scarlike fibrosis, but no evidence of malignancy or dysplasia.  NED. A&Ox3.   No respiratory distress   Abd soft, NT, ND Normal phallus with bilateral descended testicles  Cystoscopy Procedure Note  Patient identification was confirmed, informed consent was obtained, and patient was prepped using Betadine solution.  Lidocaine jelly was administered per urethral meatus.    Preoperative abx where received prior to procedure.     Pre-Procedure: - Inspection reveals a normal caliber ureteral meatus.  Procedure: The flexible cystoscope was introduced without difficulty - No urethral strictures/lesions are present. - Enlarged prostate bilobar coaptation - Minimally elevated bladder neck - Bilateral ureteral orifices identified - Bladder mucosa  reveals no ulcers, tumors, or lesions.  There is a stellate scar appreciated in the left lateral bladder wall near the dome. - No bladder stones - Mild trabeculation  Retroflexion shows no significant median lobe   Post-Procedure: - Patient tolerated the procedure well  Assessment/ Plan:  1. History of bladder cancer NED today - Urinalysis,  Complete  Return in about 6 months (around 07/04/2018) for cysto.  Hollice Espy, MD

## 2018-01-11 ENCOUNTER — Ambulatory Visit: Admit: 2018-01-11 | Discharge: 2018-01-12 | Payer: MEDICARE | Attending: "Endocrinology | Primary: "Endocrinology

## 2018-01-11 DIAGNOSIS — E119 Type 2 diabetes mellitus without complications: Principal | ICD-10-CM

## 2018-01-11 DIAGNOSIS — N500 Atrophy of testis: Secondary | ICD-10-CM

## 2018-01-11 DIAGNOSIS — E291 Testicular hypofunction: Secondary | ICD-10-CM

## 2018-01-11 MED ORDER — TESTOSTERONE 1 % (25 MG/2.5 GRAM) TRANSDERMAL GEL PACKET
PACK | Freq: Every day | TRANSDERMAL | 1 refills | 0.00000 days | Status: CP
Start: 2018-01-11 — End: 2018-07-10

## 2018-02-06 IMAGING — CR DG ABDOMEN 1V
1 series · 7 of 8 positions shown · non-contrast
Comparison: 04/19/2016 abdominal CT.

CLINICAL DATA: Bladder tumor.

EXAM:
ABDOMEN - 1 VIEW

[Series 6001: b1 · 7 of 8 slices shown]
[im 1/8]
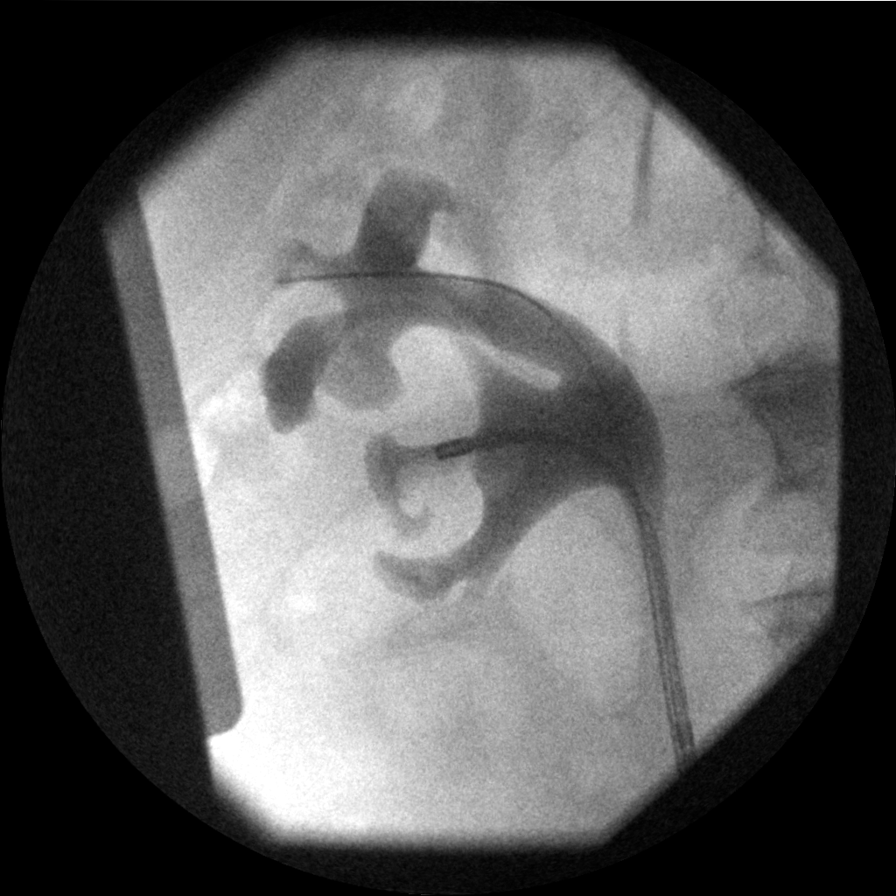
[im 2/8]
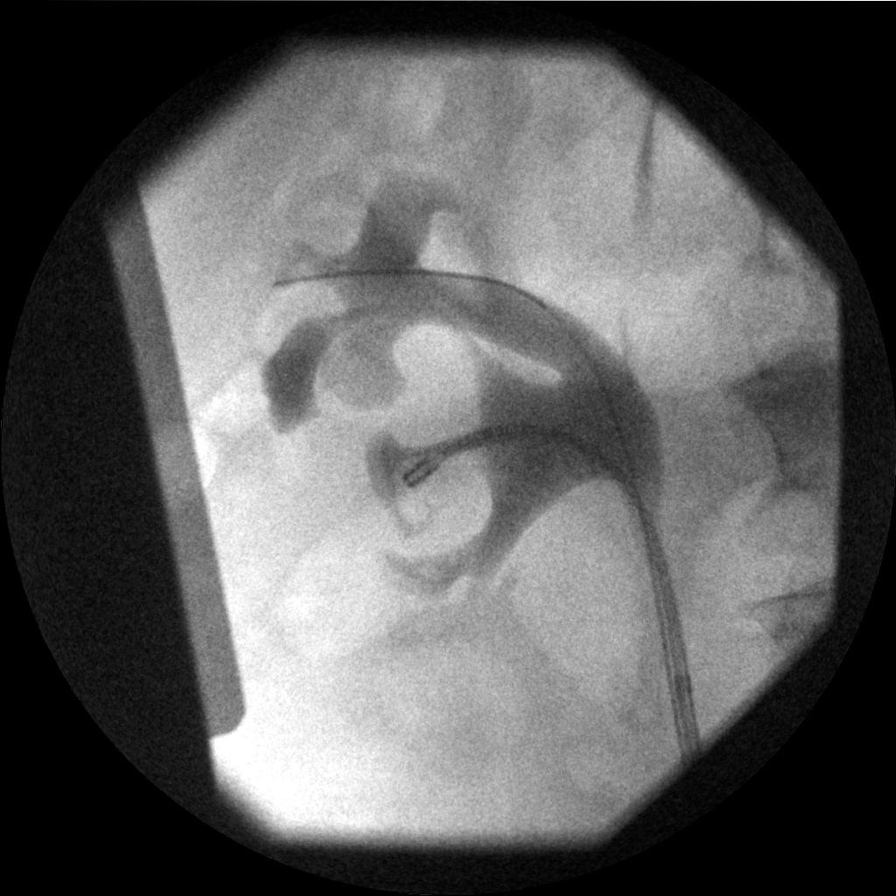
[im 3/8]
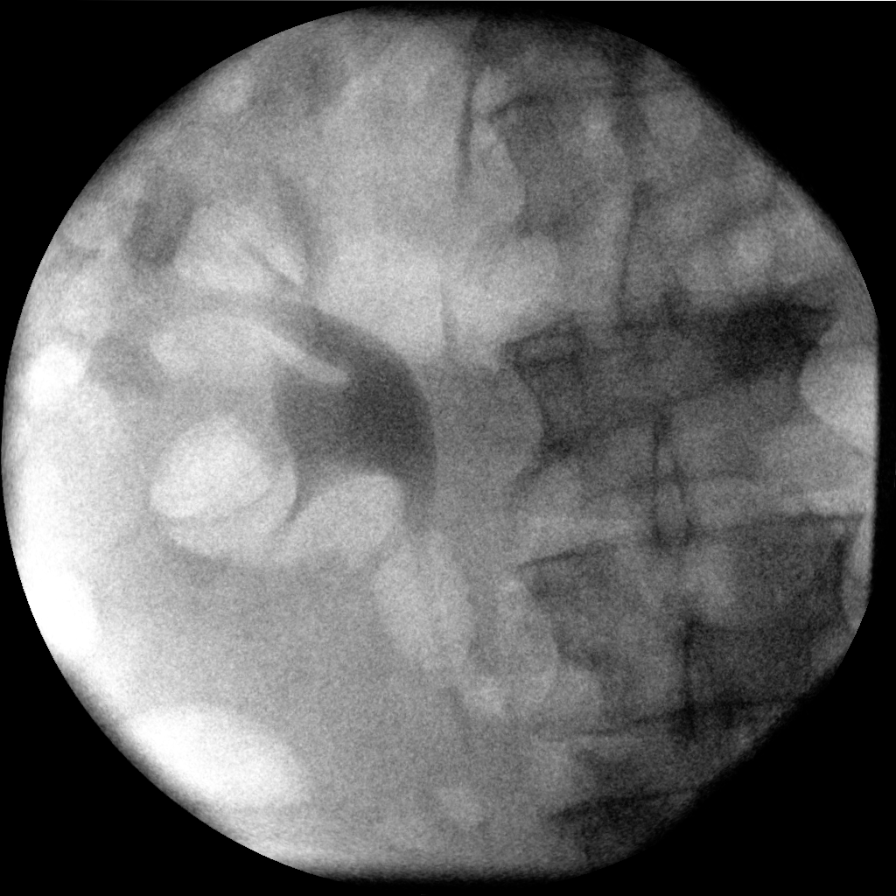
[im 4/8]
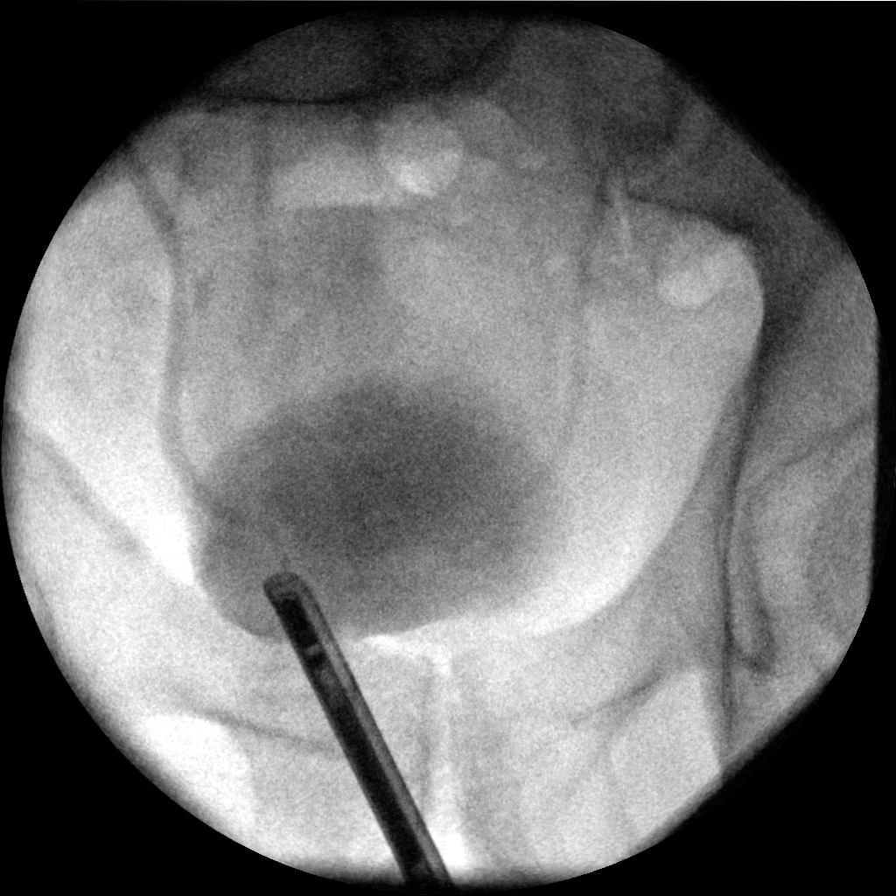
[im 5/8]
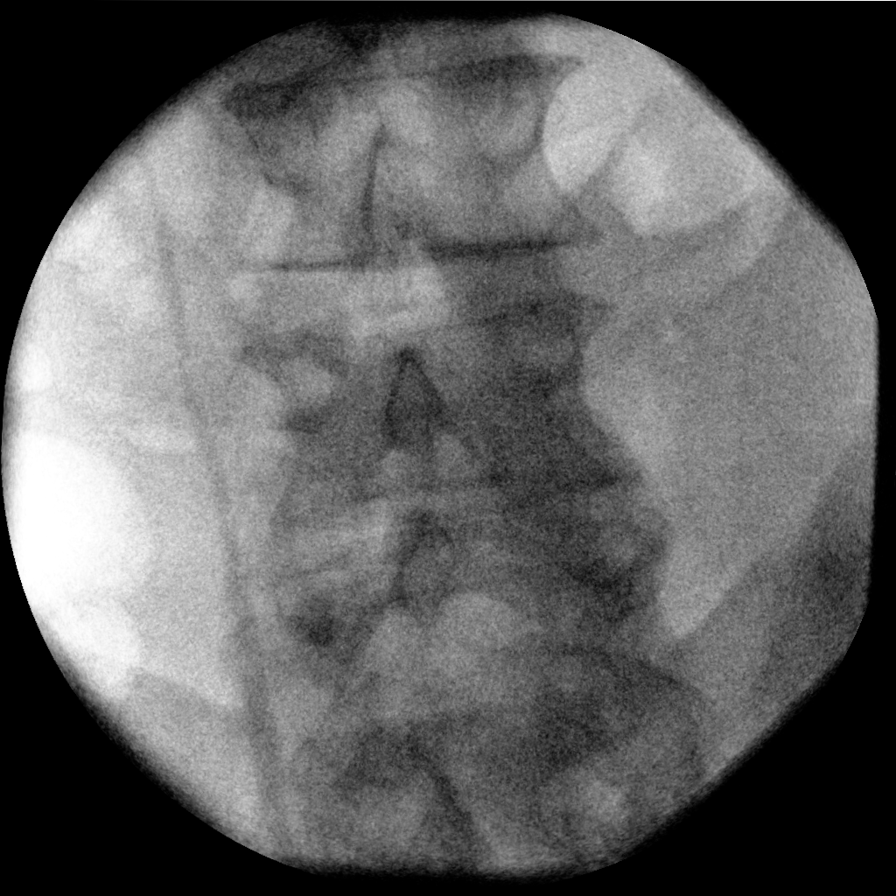
[im 6/8]
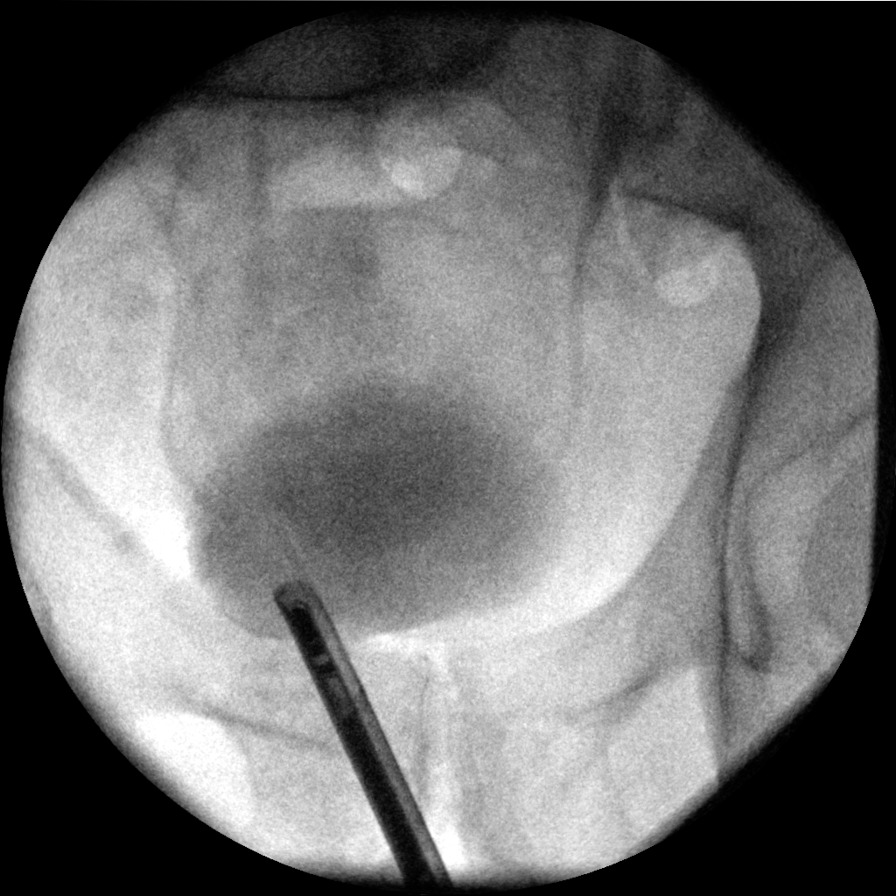
[im 7/8]
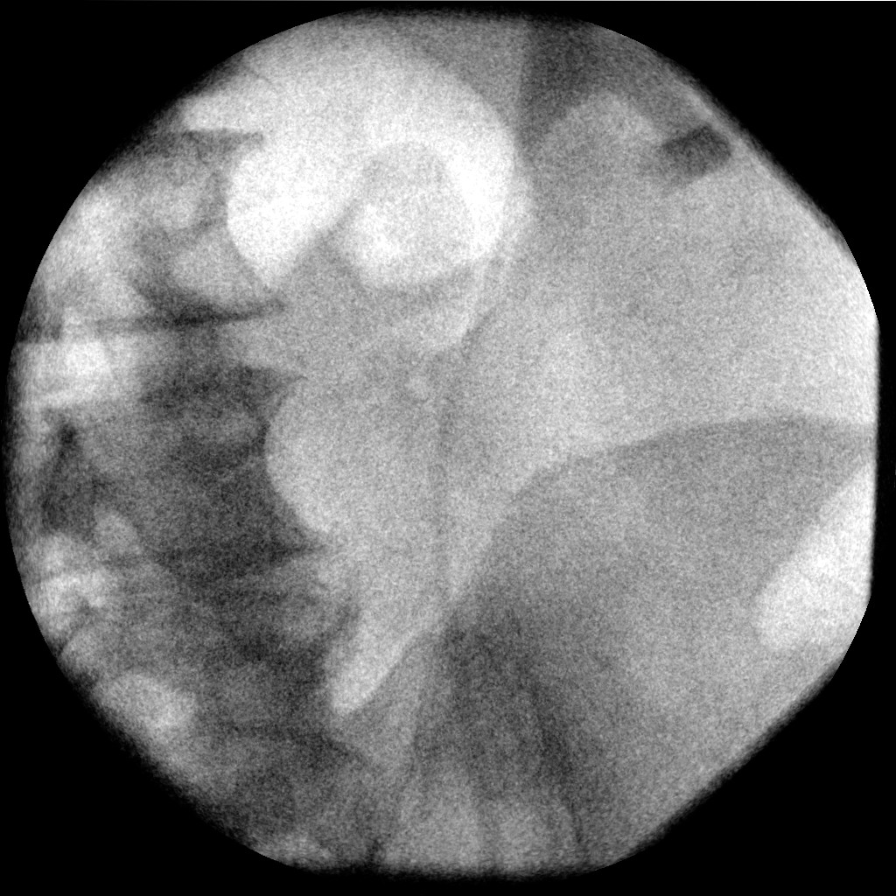

[7 of 8 positions shown; findings below may reference images not displayed]

FINDINGS: Images are not labeled for laterality. Laterality is presumed based
on preceding CT.

Right retrograde pyelogram shows smooth distortion of the lower
calices that is likely from hilar cysts seen on comparison CT. No
filling defect noted within the collecting system on the previous CT
excretory phase. Pyeloscopy was likely performed to evaluate this
area.

Left retrograde pyelogram shows no suspicious filling defect.

Bilateral partial ureterogram show no suspicious filling defect.
IMPRESSION: 1. Image laterality not labeled.
2. Presumed right-sided lower pole collecting system distortion
which is likely from hilar cyst seen on preoperative CT.

## 2018-02-20 ENCOUNTER — Encounter

## 2018-02-20 ENCOUNTER — Encounter: Payer: Self-pay | Admitting: Urology

## 2018-02-20 ENCOUNTER — Ambulatory Visit (INDEPENDENT_AMBULATORY_CARE_PROVIDER_SITE_OTHER): Payer: Medicare HMO | Admitting: Urology

## 2018-02-20 VITALS — BP 170/103 | HR 70 | Ht 70.0 in | Wt 210.0 lb

## 2018-02-20 DIAGNOSIS — Z87442 Personal history of urinary calculi: Secondary | ICD-10-CM

## 2018-02-20 DIAGNOSIS — R109 Unspecified abdominal pain: Secondary | ICD-10-CM

## 2018-02-20 DIAGNOSIS — Z8551 Personal history of malignant neoplasm of bladder: Secondary | ICD-10-CM | POA: Diagnosis not present

## 2018-02-20 LAB — URINALYSIS, COMPLETE
Bilirubin, UA: NEGATIVE
Glucose, UA: NEGATIVE
Ketones, UA: NEGATIVE
Leukocytes, UA: NEGATIVE
Nitrite, UA: NEGATIVE
Protein, UA: NEGATIVE
RBC, UA: NEGATIVE
Specific Gravity, UA: 1.02 (ref 1.005–1.030)
Urobilinogen, Ur: 0.2 mg/dL (ref 0.2–1.0)
pH, UA: 5.5 (ref 5.0–7.5)

## 2018-02-20 LAB — MICROSCOPIC EXAMINATION: Epithelial Cells (non renal): NONE SEEN /hpf (ref 0–10)

## 2018-02-20 NOTE — Progress Notes (Signed)
02/20/2018 8:27 AM   Dustin Blackburn 06/02/1940 [id number]  Referring provider: Ricardo Jericho, NP [address] [place], [place] [address]  Chief Complaint  Patient presents with  . Nephrolithiasis    HPI: 77 year old male with history of bladder cancer followed on surveillance who presents today with concerns of possible right kidney stone.  He had intermittent right flank pain over the past year.  This is exacerbated with activity and improves with rest.  He also has history of arthritis.  The pain can be sharp or dull and aching and last variable amounts of time.    He denies any associated symptoms.  No dysuria or gross hematuria.  No nausea or vomiting.  No fevers or chills.  He reports that he has been told that he had a kidney stone in the past.  He was seen in 12/2016 for right flank pain at which time KUB was ordered.  KUB 1 year ago without evidence of calcifications in his kidneys or ureter.    PMH: Past Medical History:  Diagnosis Date  . Arthritis   . Asthma   . Cancer ([facility]) 2018   bladder  . Cold   . COPD (chronic obstructive pulmonary disease) ([facility])   . Coronary artery disease   . DDD (degenerative disc disease), cervical   . Diabetes mellitus without complication ([facility])   . Dyspnea   . GERD (gastroesophageal reflux disease)   . Hearing loss associated with syndrome of both ears   . Heart attack ([facility]) 2016  . History of aspiration pneumonitis   . History of kidney stones   . Hyperlipidemia   . Kidney cysts   . Kidney stones   . Osteoarthritis of back   . Psoriasis   . Psoriasis   . Seasonal allergies     Surgical History: Past Surgical History:  Procedure Laterality Date  . CARDIAC CATHETERIZATION  2016 and 2018  . COLONOSCOPY    . COLONOSCOPY WITH PROPOFOL N/A 09/18/2017   Procedure: COLONOSCOPY WITH PROPOFOL;  Surgeon: Lollie Sails, MD;  Location: [facility] ENDOSCOPY;  Service: Endoscopy;  Laterality: N/A;  . CORONARY ANGIOPLASTY   2016  . CORONARY STENT PLACEMENT  2016  . CYSTOSCOPY W/ RETROGRADES Bilateral 06/12/2016   Procedure: CYSTOSCOPY WITH RETROGRADE PYELOGRAM;  Surgeon: Hollice Espy, MD;  Location: [facility] ORS;  Service: Urology;  Laterality: Bilateral;  . CYSTOSCOPY W/ RETROGRADES Bilateral 10/02/2017   Procedure: CYSTOSCOPY WITH RETROGRADE PYELOGRAM;  Surgeon: Hollice Espy, MD;  Location: [facility] ORS;  Service: Urology;  Laterality: Bilateral;  . CYSTOSCOPY WITH BIOPSY N/A 10/02/2017   Procedure: CYSTOSCOPY WITH Bladder BIOPSY;  Surgeon: Hollice Espy, MD;  Location: [facility] ORS;  Service: Urology;  Laterality: N/A;  . CYSTOSCOPY WITH URETEROSCOPY Right 10/02/2017   Procedure: DIAGNOSTIC RIGHT URETEROSCOPY;  Surgeon: Hollice Espy, MD;  Location: [facility] ORS;  Service: Urology;  Laterality: Right;  . ESOPHAGOGASTRODUODENOSCOPY (EGD) WITH PROPOFOL N/A 09/18/2017   Procedure: ESOPHAGOGASTRODUODENOSCOPY (EGD) WITH PROPOFOL;  Surgeon: Lollie Sails, MD;  Location: [facility] ENDOSCOPY;  Service: Endoscopy;  Laterality: N/A;  . fatty tumors removed    . HIATAL HERNIA REPAIR  1992  . LIPOMA EXCISION     right leg  . RIGHT/LEFT HEART CATH AND CORONARY ANGIOGRAPHY N/A 12/21/2016   Procedure: Right/Left Heart Cath and Coronary Angiography;  Surgeon: Isaias Cowman, MD;  Location: [facility] LAB;  Service: Cardiovascular;  Laterality: N/A;  . SINUS EXPLORATION  1992  . TRANSURETHRAL RESECTION OF BLADDER TUMOR WITH MITOMYCIN-C N/A  06/12/2016   Procedure: TRANSURETHRAL RESECTION OF BLADDER TUMOR WITH MITOMYCIN-C;  Surgeon: Hollice Espy, MD;  Location: [facility] ORS;  Service: Urology;  Laterality: N/A;  . UMBILICAL HERNIA REPAIR N/A 06/12/2016   Procedure: HERNIA REPAIR UMBILICAL ADULT;  Surgeon: Christene Lye, MD;  Location: [facility] ORS;  Service: General;  Laterality: N/A;  . URETEROSCOPY Right 06/12/2016   Procedure: URETEROSCOPY;  Surgeon: Hollice Espy, MD;  Location: [facility] ORS;  Service: Urology;  Laterality: Right;      Home Medications:  Allergies as of 02/20/2018      Reactions   Actos [pioglitazone] Palpitations   Pt reported a MI    Glimepiride Rash   Glipizide Shortness Of Breath   Metformin Shortness Of Breath   Pt states he "can't breathe and it gives him sinus congestion".   Amoxicillin Nausea Only   Erythromycin Base Nausea Only   Esomeprazole Magnesium Other (See Comments)   Makes patients heart burn worse   Lipitor [atorvastatin]    indigestion   Liraglutide Other (See Comments)   bloating   Penicillins Nausea Only   Has patient had a PCN reaction causing immediate rash, facial/tongue/throat swelling, SOB or lightheadedness with hypotension: no Has patient had a PCN reaction causing severe rash involving mucus membranes or skin necrosis: no Has patient had a PCN reaction that required hospitalization:no Has patient had a PCN reaction occurring within the last 10 years: no If all of the above answers are "NO", then may proceed with Cephalosporin use.   Sitagliptin Other (See Comments)   Pt states it "gives him joint pain" & redness      Medication List        Accurate as of 02/20/18  8:27 AM. Always use your most recent med list.          albuterol (2.5 MG/3ML) 0.083% nebulizer solution Commonly known as:  PROVENTIL Take 2.5 mg by nebulization every 4 (four) hours as needed for wheezing or shortness of breath.   ANDROGEL 50 MG/5GM (1%) Gel Generic drug:  testosterone Place 5 g onto the skin daily.   aspirin EC 81 MG tablet Take 81 mg by mouth daily.   B-12 PO Take 10 drops by mouth daily.   fluocinonide 0.05 % external solution Commonly known as:  LIDEX Apply 1 application topically 2 (two) times daily as needed (psoriasis).   fluticasone-salmeterol 115-21 MCG/ACT inhaler Commonly known as:  ADVAIR HFA Inhale 2 puffs into the lungs 2 (two) times daily. Rinse mouth after use.   insulin NPH Human 100 UNIT/ML injection Commonly known as:  HUMULIN N,NOVOLIN  N Inject 10 Units into the skin 2 (two) times daily.   ipratropium-albuterol 0.5-2.5 (3) MG/3ML Soln Commonly known as:  DUONEB Take 3 mLs by nebulization 2 (two) times daily.   montelukast 10 MG tablet Commonly known as:  SINGULAIR Take 10 mg by mouth at bedtime.   NON FORMULARY Take 2 capsules by mouth 2 (two) times daily.   RELION PEN NEEDLES 31G X 6 MM Misc Generic drug:  Insulin Pen Needle   SPIRIVA HANDIHALER 18 MCG inhalation capsule Generic drug:  tiotropium Place into inhaler and inhale.   sucralfate 1 g tablet Commonly known as:  CARAFATE Take 1 g by mouth 2 (two) times daily.   triamcinolone cream 0.1 % Commonly known as:  KENALOG Apply 1 application topically 2 (two) times daily as needed (psoriasis).       Allergies:  Allergies  Allergen Reactions  . Actos [Pioglitazone] Palpitations  Pt reported a MI   . Glimepiride Rash  . Glipizide Shortness Of Breath  . Metformin Shortness Of Breath    Pt states he "can't breathe and it gives him sinus congestion".   . Amoxicillin Nausea Only  . Erythromycin Base Nausea Only  . Esomeprazole Magnesium Other (See Comments)    Makes patients heart burn worse  . Lipitor [Atorvastatin]     indigestion  . Liraglutide Other (See Comments)    bloating  . Penicillins Nausea Only    Has patient had a PCN reaction causing immediate rash, facial/tongue/throat swelling, SOB or lightheadedness with hypotension: no Has patient had a PCN reaction causing severe rash involving mucus membranes or skin necrosis: no Has patient had a PCN reaction that required hospitalization:no Has patient had a PCN reaction occurring within the last 10 years: no If all of the above answers are "NO", then may proceed with Cephalosporin use.   . Sitagliptin Other (See Comments)    Pt states it "gives him joint pain" & redness     Family History: Family History  Problem Relation Age of Onset  . Cancer Mother   . Prostate cancer Maternal  Uncle   . Prostate cancer Paternal Uncle   . Prostate cancer Cousin   . Congestive Heart Failure Father   . Kidney cancer Neg Hx   . Bladder Cancer Neg Hx     Social History:  reports that he quit smoking about 27 years ago. His smoking use included cigarettes. He quit after 30.00 years of use. He has quit using smokeless tobacco.  His smokeless tobacco use included chew. He reports that he drinks alcohol. He reports that he does not use drugs.  ROS: UROLOGY Frequent Urination?: No Hard to postpone urination?: No Burning/pain with urination?: No Get up at night to urinate?: No Leakage of urine?: No Urine stream starts and stops?: No Trouble starting stream?: No Do you have to strain to urinate?: No Blood in urine?: No Urinary tract infection?: No Sexually transmitted disease?: No Injury to kidneys or bladder?: No Painful intercourse?: No Weak stream?: No Erection problems?: No Penile pain?: No  Gastrointestinal Nausea?: No Vomiting?: No Indigestion/heartburn?: No Diarrhea?: No Constipation?: No  Constitutional Fever: No Night sweats?: No Weight loss?: No Fatigue?: Yes  Skin Skin rash/lesions?: No Itching?: No  Eyes Blurred vision?: No Double vision?: No  Ears/Nose/Throat Sore throat?: No Sinus problems?: No  Hematologic/Lymphatic Swollen glands?: No Easy bruising?: No  Cardiovascular Leg swelling?: No Chest pain?: No  Respiratory Cough?: No Shortness of breath?: No  Endocrine Excessive thirst?: No  Musculoskeletal Back pain?: No Joint pain?: No  Neurological Headaches?: No Dizziness?: No  Psychologic Depression?: No Anxiety?: No  Physical Exam: BP (!) 170/103   Pulse 70   Ht 5\' 10"  (1.778 m)   Wt 210 lb (95.3 kg)   BMI 30.13 kg/m   Constitutional:  Alert and oriented, No acute distress. HEENT: [place] AT, moist mucus membranes.  Trachea midline, no masses. Cardiovascular: No clubbing, cyanosis, or edema. Respiratory: Normal  respiratory effort, no increased work of breathing. GU: No CVA tenderness Skin: No rashes, bruises or suspicious lesions. Neurologic: Grossly intact, no focal deficits, moving all 4 extremities. Psychiatric: Normal mood and affect.  Laboratory Data: Lab Results  Component Value Date   WBC 10.5 06/02/2016   HGB 15.8 06/02/2016   HCT 46.5 06/02/2016   MCV 91.4 06/02/2016   PLT 260 06/02/2016    Lab Results  Component Value Date   CREATININE  0.80 06/02/2016    Urinalysis NA  Pertinent Imaging: NA  Assessment & Plan:    1. History of kidney stones Personal history of kidney stones with intermittent right flank pain over the past year Previous KUB without evidence of stones Given the patient's extreme concern for stones, I recommended pursuing CT stone protocol to rule out obstructing calcification Given that his pain is exacerbated with activity and is relatively chronic, I am more suspicious of MSK etiology I will call him with his CT scan results - Urinalysis, Complete - CT RENAL STONE STUDY; Future  2. Flank pain As above  3. History of bladder cancer Due for 6 month surveillance cystoscopy 06/2018  F/u as scheduled  Hollice Espy, MD  [facility] [address], [place], [place] [address] [phone]

## 2018-02-27 ENCOUNTER — Ambulatory Visit
Admission: RE | Admit: 2018-02-27 | Discharge: 2018-02-27 | Disposition: A | Discharge status: Home / Self Care | Payer: Medicare HMO | Source: Ambulatory Visit | Attending: Urology | Admitting: Urology

## 2018-02-27 ENCOUNTER — Telehealth: Payer: Self-pay | Admitting: Family Medicine

## 2018-02-27 DIAGNOSIS — Z87442 Personal history of urinary calculi: Secondary | ICD-10-CM

## 2018-02-27 DIAGNOSIS — N200 Calculus of kidney: Secondary | ICD-10-CM | POA: Diagnosis not present

## 2018-02-27 DIAGNOSIS — R109 Unspecified abdominal pain: Secondary | ICD-10-CM | POA: Diagnosis present

## 2018-02-27 NOTE — Telephone Encounter (Signed)
LMOM for patient to return call.

## 2018-02-27 NOTE — Telephone Encounter (Signed)
Patient notified and voiced understanding.

## 2018-02-27 NOTE — Telephone Encounter (Signed)
-----   Message from Hollice Espy, MD sent at 02/27/2018  4:39 PM EDT ----- Please let the patient know that he has multiple small stones bilaterally in his kidneys but none that are passing.  They should NOT be causing pain.  I would not recommend any intervention for these for the time being.  Hollice Espy, MD

## 2018-04-19 ENCOUNTER — Other Ambulatory Visit: Payer: Self-pay | Admitting: Gastroenterology

## 2018-04-19 DIAGNOSIS — R1013 Epigastric pain: Secondary | ICD-10-CM

## 2018-04-19 DIAGNOSIS — R140 Abdominal distension (gaseous): Secondary | ICD-10-CM

## 2018-05-07 ENCOUNTER — Ambulatory Visit: Payer: Medicare HMO

## 2018-05-23 ENCOUNTER — Other Ambulatory Visit: Payer: Self-pay

## 2018-05-24 ENCOUNTER — Ambulatory Visit: Payer: Medicare HMO

## 2018-06-03 ENCOUNTER — Telehealth: Payer: Self-pay | Admitting: Internal Medicine

## 2018-06-03 NOTE — Telephone Encounter (Signed)
LM for Otila Kluver that we did not get request for surgery. LM to re-fax to [phone].

## 2018-06-05 ENCOUNTER — Encounter: Payer: Self-pay | Admitting: Internal Medicine

## 2018-06-05 ENCOUNTER — Telehealth: Payer: Self-pay | Admitting: Internal Medicine

## 2018-06-05 ENCOUNTER — Ambulatory Visit: Payer: Medicare HMO | Admitting: Internal Medicine

## 2018-06-05 VITALS — BP 132/68 | HR 74 | Ht 70.0 in | Wt 222.8 lb

## 2018-06-05 DIAGNOSIS — J449 Chronic obstructive pulmonary disease, unspecified: Secondary | ICD-10-CM

## 2018-06-05 DIAGNOSIS — Z01811 Encounter for preprocedural respiratory examination: Secondary | ICD-10-CM | POA: Diagnosis not present

## 2018-06-05 NOTE — Patient Instructions (Signed)
You are cleared for surgery. Continue your usual inhalers.  If you having a chest cold you should cancel the procedure.

## 2018-06-05 NOTE — Telephone Encounter (Signed)
Faxed surgery clearance form to [facility] with fax confirmation.

## 2018-06-05 NOTE — Progress Notes (Signed)
[facility]     Assessment and Plan:  COPD with chronic bronchitis. - Now doing better, recommended that he use advair at least once per day.  --Increase to twice daily if he feels something coming on.   Allergic rhinitis. - Can use flonase during allergy season.   GERD. -May be contributing to respiratory disease.   --Continue to follow up with gastroenterology.   Preoperative pulmonary evaluation. - Patient is cleared for surgery, he is at moderate risk of pulmonary complications with surgery.  He is asked to continue his usual inhalers, and to avoid surgery if he feels that he is having a chest cold.  Return in about 1 year (around 06/06/2019).   Date: 06/05/2018  MRN# [id number] Dustin Blackburn 1940/09/22    Dustin Blackburn is a 78 y.o. old male seen in consultation for chief complaint of:    Chief Complaint  Patient presents with  . Follow-up    surgery clearance- pt states breathing is baseline- sob with exertion.     HPI:   The patient is a 78 year old male whose history includes empty sella syndrome, GERD, COPD with exacerbation.  At last visit he was recovering from a COPD exacerbation, he was asked to continue Advair, Flonase. He recently saw ENT and is undergoing balloon dilatation of the bilateral eustachian tubes, endoscopic nasopharyngoscopy.  He is anticipating about 30 minutes of general anesthesia.  He has tolerated previous surgeries well with no overt pulmonary complications in the past.  Though he tells me that remotely he had a surgery on his esophagus which resulted in loss of some of his left lung, he appears to have recovered from this.  I see no mention of this in most recent history going back to 2015.  He feels that his breathing has been doing well, he has had no further COPD exacerbations. He is using his albuterol inhaler usually in the morning. He is using symbicort 2 puffs twice per day and thinks that it helps. He rinses mouth  after use usually. He does have dyspnea with  heavy activity such as cutting wood. He can walk a grocery store or do a flight of stairs without difficulty. Denies cough.    Social Hx:   Social History   Tobacco Use  . Smoking status: Former Smoker    Years: 30.00    Types: Cigarettes    Last attempt to quit: 05/29/1990    Years since quitting: 28.0  . Smokeless tobacco: Former Systems developer    Types: Chew  Substance Use Topics  . Alcohol use: Yes    Comment: rarely  . Drug use: No   Medication:    Current Outpatient Medications:  .  albuterol (PROVENTIL) (2.5 MG/3ML) 0.083% nebulizer solution, Take 2.5 mg by nebulization every 4 (four) hours as needed for wheezing or shortness of breath. , Disp: , Rfl:  .  aspirin EC 81 MG tablet, Take 81 mg by mouth daily., Disp: , Rfl:  .  Cyanocobalamin (B-12 PO), Take 10 drops by mouth daily., Disp: , Rfl:  .  fluocinonide (LIDEX) 0.05 % external solution, Apply 1 application topically 2 (two) times daily as needed (psoriasis). , Disp: , Rfl:  .  fluticasone-salmeterol (ADVAIR HFA) 115-21 MCG/ACT inhaler, Inhale 2 puffs into the lungs 2 (two) times daily. Rinse mouth after use. (Patient not taking: Reported on 05/23/2018), Disp: 1 Inhaler, Rfl: 12 .  insulin NPH Human (HUMULIN N,NOVOLIN N) 100 UNIT/ML injection, Inject 10 Units into the  skin 2 (two) times daily. , Disp: , Rfl:  .  ipratropium-albuterol (DUONEB) 0.5-2.5 (3) MG/3ML SOLN, Take 3 mLs by nebulization 2 (two) times daily., Disp: 360 mL, Rfl: 1 .  metoCLOPramide (REGLAN) 5 MG tablet, Take 5 mg by mouth 3 (three) times daily before meals., Disp: , Rfl:  .  montelukast (SINGULAIR) 10 MG tablet, Take 10 mg by mouth at bedtime. , Disp: , Rfl:  .  NON FORMULARY, Take 2 capsules by mouth 2 (two) times daily., Disp: , Rfl:  .  RELION PEN NEEDLES 31G X 6 MM MISC, , Disp: , Rfl:  .  sucralfate (CARAFATE) 1 g tablet, Take 1 g by mouth 2 (two) times daily., Disp: , Rfl:  .  testosterone (ANDROGEL) 50 MG/5GM  (1%) GEL, Place 5 g onto the skin daily. , Disp: , Rfl:  .  tiotropium (SPIRIVA HANDIHALER) 18 MCG inhalation capsule, Place into inhaler and inhale., Disp: , Rfl:  .  triamcinolone cream (KENALOG) 0.1 %, Apply 1 application topically 2 (two) times daily as needed (psoriasis). , Disp: , Rfl:    Allergies:  Actos [pioglitazone]; Glimepiride; Glipizide; Metformin; Amoxicillin; Erythromycin base; Esomeprazole magnesium; Lipitor [atorvastatin]; Liraglutide; Penicillins; and Sitagliptin   Review of Systems:  Constitutional: Feels well. Cardiovascular: Denies chest pain, exertional chest pain.  Pulmonary: Denies hemoptysis, pleuritic chest pain.   The remainder of systems were reviewed and were found to be negative other than what is documented in the HPI.    Physical Examination:   VS: BP 132/68 (BP Location: Left Arm, Cuff Size: Normal)   Pulse 74   Ht 5\' 10"  (1.778 m)   Wt 222 lb 12.8 oz (101.1 kg)   SpO2 94%   BMI 31.97 kg/m   General Appearance: No distress  Neuro:without focal findings, mental status, speech normal, alert and oriented HEENT: PERRLA, EOM intact Pulmonary: No wheezing, No rales  CardiovascularNormal S1,S2.  No m/r/g.  Abdomen: Benign, Soft, non-tender, No masses Renal:  No costovertebral tenderness  GU:  No performed at this time. Endoc: No evident thyromegaly, no signs of acromegaly or Cushing features Skin:   warm, no rashes, no ecchymosis  Extremities: normal, no cyanosis, clubbing.      LABORATORY PANEL:   CBC No results for input(s): WBC, HGB, HCT, PLT in the last 168 hours. ------------------------------------------------------------------------------------------------------------------  Chemistries  No results for input(s): NA, K, CL, CO2, GLUCOSE, BUN, CREATININE, CALCIUM, MG, AST, ALT, ALKPHOS, BILITOT in the last 168 hours.  Invalid input(s):  GFRCGP ------------------------------------------------------------------------------------------------------------------  Cardiac Enzymes No results for input(s): TROPONINI in the last 168 hours. ------------------------------------------------------------  RADIOLOGY:  No results found.     Thank  you for the consultation and for allowing [facility] to assist in the care of your patient. Our recommendations are noted above.  Please contact us if we can be of further service.  Marda Stalker, M.D., F.C.C.P.  Board Certified in Internal Medicine, Pulmonary Medicine, [place], and Sleep Medicine.  [facility] Office Number: [phone]   06/05/2018

## 2018-06-06 ENCOUNTER — Other Ambulatory Visit: Payer: Self-pay

## 2018-06-06 ENCOUNTER — Encounter: Payer: Self-pay | Admitting: *Deleted

## 2018-06-06 NOTE — Discharge Instructions (Signed)
General Anesthesia, Adult, Care After  This sheet gives you information about how to care for yourself after your procedure. Your health care provider may also give you more specific instructions. If you have problems or questions, contact your health care provider.  What can I expect after the procedure?  After the procedure, the following side effects are common:  Pain or discomfort at the IV site.  Nausea.  Vomiting.  Sore throat.  Trouble concentrating.  Feeling cold or chills.  Weak or tired.  Sleepiness and fatigue.  Soreness and body aches. These side effects can affect parts of the body that were not involved in surgery.  Follow these instructions at home:    For at least 24 hours after the procedure:  Have a responsible adult stay with you. It is important to have someone help care for you until you are awake and alert.  Rest as needed.  Do not:  Participate in activities in which you could fall or become injured.  Drive.  Use heavy machinery.  Drink alcohol.  Take sleeping pills or medicines that cause drowsiness.  Make important decisions or sign legal documents.  Take care of children on your own.  Eating and drinking  Follow any instructions from your health care provider about eating or drinking restrictions.  When you feel hungry, start by eating small amounts of foods that are soft and easy to digest (bland), such as toast. Gradually return to your regular diet.  Drink enough fluid to keep your urine pale yellow.  If you vomit, rehydrate by drinking water, juice, or clear broth.  General instructions  If you have sleep apnea, surgery and certain medicines can increase your risk for breathing problems. Follow instructions from your health care provider about wearing your sleep device:  Anytime you are sleeping, including during daytime naps.  While taking prescription pain medicines, sleeping medicines, or medicines that make you drowsy.  Return to your normal activities as told by your health care  provider. Ask your health care provider what activities are safe for you.  Take over-the-counter and prescription medicines only as told by your health care provider.  If you smoke, do not smoke without supervision.  Keep all follow-up visits as told by your health care provider. This is important.  Contact a health care provider if:  You have nausea or vomiting that does not get better with medicine.  You cannot eat or drink without vomiting.  You have pain that does not get better with medicine.  You are unable to pass urine.  You develop a skin rash.  You have a fever.  You have redness around your IV site that gets worse.  Get help right away if:  You have difficulty breathing.  You have chest pain.  You have blood in your urine or stool, or you vomit blood.  Summary  After the procedure, it is common to have a sore throat or nausea. It is also common to feel tired.  Have a responsible adult stay with you for the first 24 hours after general anesthesia. It is important to have someone help care for you until you are awake and alert.  When you feel hungry, start by eating small amounts of foods that are soft and easy to digest (bland), such as toast. Gradually return to your regular diet.  Drink enough fluid to keep your urine pale yellow.  Return to your normal activities as told by your health care provider. Ask your health care   provider what activities are safe for you.  This information is not intended to replace advice given to you by your health care provider. Make sure you discuss any questions you have with your health care provider.  Document Released: 08/21/2000 Document Revised: 12/29/2016 Document Reviewed: 12/29/2016  Elsevier Interactive Patient Education  2019 [employer]

## 2018-06-13 ENCOUNTER — Encounter
Admission: RE | Disposition: A | Discharge status: Home / Self Care | Payer: Self-pay | Source: Home / Self Care | Attending: Otolaryngology

## 2018-06-13 ENCOUNTER — Ambulatory Visit: Payer: Medicare HMO | Admitting: Anesthesiology

## 2018-06-13 ENCOUNTER — Ambulatory Visit
Admission: RE | Admit: 2018-06-13 | Discharge: 2018-06-13 | Disposition: A | Discharge status: Home / Self Care | Payer: Medicare HMO | Attending: Otolaryngology | Admitting: Otolaryngology

## 2018-06-13 DIAGNOSIS — Z7982 Long term (current) use of aspirin: Secondary | ICD-10-CM | POA: Insufficient documentation

## 2018-06-13 DIAGNOSIS — I252 Old myocardial infarction: Secondary | ICD-10-CM | POA: Insufficient documentation

## 2018-06-13 DIAGNOSIS — H6983 Other specified disorders of Eustachian tube, bilateral: Secondary | ICD-10-CM | POA: Diagnosis not present

## 2018-06-13 DIAGNOSIS — Z8551 Personal history of malignant neoplasm of bladder: Secondary | ICD-10-CM | POA: Diagnosis not present

## 2018-06-13 DIAGNOSIS — E119 Type 2 diabetes mellitus without complications: Secondary | ICD-10-CM | POA: Insufficient documentation

## 2018-06-13 DIAGNOSIS — J301 Allergic rhinitis due to pollen: Secondary | ICD-10-CM | POA: Insufficient documentation

## 2018-06-13 DIAGNOSIS — H903 Sensorineural hearing loss, bilateral: Secondary | ICD-10-CM | POA: Diagnosis not present

## 2018-06-13 DIAGNOSIS — Z79899 Other long term (current) drug therapy: Secondary | ICD-10-CM | POA: Insufficient documentation

## 2018-06-13 HISTORY — PX: NASOPHARYNGOSCOPY EUSTATION TUBE BALLOON DILATION: SHX6729

## 2018-06-13 HISTORY — PX: TURBINATE REDUCTION: SHX6157

## 2018-06-13 LAB — GLUCOSE, CAPILLARY
Glucose-Capillary: 125 mg/dL — ABNORMAL HIGH (ref 70–99)
Glucose-Capillary: 124 mg/dL — ABNORMAL HIGH (ref 70–99)

## 2018-06-13 SURGERY — NASOPHARYNGOSCOPY, WITH EUSTACHIAN TUBE DILATION USING BALLOON
Anesthesia: General | Site: Nose | Laterality: Bilateral

## 2018-06-13 MED ORDER — MIDAZOLAM HCL 5 MG/5ML IJ SOLN
INTRAMUSCULAR | Status: DC | PRN
  Administered 2018-06-13 (×2): 2 mg via INTRAVENOUS

## 2018-06-13 MED ORDER — PROPOFOL 10 MG/ML IV BOLUS
INTRAVENOUS | Status: DC | PRN
  Administered 2018-06-13: 150 mg via INTRAVENOUS

## 2018-06-13 MED ORDER — LIDOCAINE HCL (CARDIAC) PF 100 MG/5ML IV SOSY
PREFILLED_SYRINGE | INTRAVENOUS | Status: DC | PRN
  Administered 2018-06-13: 40 mg via INTRATRACHEAL

## 2018-06-13 MED ORDER — FENTANYL CITRATE (PF) 100 MCG/2ML IJ SOLN
INTRAMUSCULAR | Status: DC | PRN
  Administered 2018-06-13: 25 ug via INTRAVENOUS

## 2018-06-13 MED ORDER — EPHEDRINE SULFATE 50 MG/ML IJ SOLN
INTRAMUSCULAR | Status: DC | PRN
  Administered 2018-06-13: 10 mg via INTRAVENOUS
  Administered 2018-06-13: 5 mg via INTRAVENOUS

## 2018-06-13 MED ORDER — PHENYLEPHRINE HCL 0.5 % NA SOLN
NASAL | Status: DC | PRN
  Administered 2018-06-13: 15 mL via TOPICAL

## 2018-06-13 MED ORDER — ACETAMINOPHEN 10 MG/ML IV SOLN
1000.0000 mg | Freq: Once | INTRAVENOUS | Status: AC
  Administered 2018-06-13: 1000 mg via INTRAVENOUS

## 2018-06-13 MED ORDER — LACTATED RINGERS IV SOLN
10.0000 mL/h | INTRAVENOUS | Status: DC
  Administered 2018-06-13: 10 mL/h via INTRAVENOUS

## 2018-06-13 MED ORDER — FENTANYL CITRATE (PF) 100 MCG/2ML IJ SOLN: 25.0000 ug | INTRAMUSCULAR | Status: DC | PRN

## 2018-06-13 MED ORDER — OXYMETAZOLINE HCL 0.05 % NA SOLN
2.0000 | Freq: Once | NASAL | Status: AC
  Administered 2018-06-13: 2 via NASAL

## 2018-06-13 MED ORDER — OXYCODONE HCL 5 MG PO TABS: 5.0000 mg | ORAL_TABLET | Freq: Once | ORAL | Status: DC | PRN

## 2018-06-13 MED ORDER — DEXAMETHASONE SODIUM PHOSPHATE 4 MG/ML IJ SOLN
INTRAMUSCULAR | Status: DC | PRN
  Administered 2018-06-13: 4 mg via INTRAVENOUS

## 2018-06-13 MED ORDER — ONDANSETRON HCL 4 MG/2ML IJ SOLN
INTRAMUSCULAR | Status: DC | PRN
  Administered 2018-06-13: 4 mg via INTRAVENOUS

## 2018-06-13 MED ORDER — OXYCODONE HCL 5 MG/5ML PO SOLN: 5.0000 mg | Freq: Once | ORAL | Status: DC | PRN

## 2018-06-13 MED ORDER — GLYCOPYRROLATE 0.2 MG/ML IJ SOLN
INTRAMUSCULAR | Status: DC | PRN
  Administered 2018-06-13: .1 mg via INTRAVENOUS

## 2018-06-13 SURGICAL SUPPLY — 28 items
BALLN CATH EUST TUBE 6X16 (BALLOONS) ×2
BASIN GRAD PLASTIC 32OZ STRL (MISCELLANEOUS) ×2 IMPLANT
CANISTER SUCT 1200ML W/VALVE (MISCELLANEOUS) ×2 IMPLANT
CATH BALLOON EUST TUBE 6X16 (BALLOONS) ×1 IMPLANT
COAGULATOR SUCT 8FR VV (MISCELLANEOUS) ×1 IMPLANT
COVER LIGHT HANDLE UNIVERSAL (MISCELLANEOUS) ×2 IMPLANT
COVER MAYO STAND STRL (DRAPES) ×1 IMPLANT
CUP MEDICINE 2OZ PLAST GRAD ST (MISCELLANEOUS) ×2 IMPLANT
DEVICE INFLATION SEID (MISCELLANEOUS) ×2 IMPLANT
DRAPE EENT SPLIT AURORA (DRAPES) ×1 IMPLANT
DRAPE HEAD BAR (DRAPES) ×3 IMPLANT
ELECT REM PT RETURN 9FT ADLT (ELECTROSURGICAL) ×2
ELECTRODE REM PT RTRN 9FT ADLT (ELECTROSURGICAL) ×1 IMPLANT
GAUZE 4X4 16PLY RFD (DISPOSABLE) ×1 IMPLANT
GLOVE PI ULTRA LF STRL 7.5 (GLOVE) ×1 IMPLANT
GLOVE PI ULTRA NON LATEX 7.5 (GLOVE) ×1
GOWN STRL REUS W/ TWL LRG LVL3 (GOWN DISPOSABLE) ×1 IMPLANT
GOWN STRL REUS W/TWL LRG LVL3 (GOWN DISPOSABLE) ×1
NDL ANESTHESIA 27G X 3.5 (NEEDLE) ×1 IMPLANT
NEEDLE ANESTHESIA  27G X 3.5 (NEEDLE) ×1
NEEDLE ANESTHESIA 27G X 3.5 (NEEDLE) ×1 IMPLANT
NS IRRIG 500ML POUR BTL (IV SOLUTION) ×2 IMPLANT
PACK ENT CUSTOM (PACKS) ×1 IMPLANT
PATTIES SURGICAL .5 X3 (DISPOSABLE) ×2 IMPLANT
SOL ANTI-FOG 6CC FOG-OUT (MISCELLANEOUS) ×1 IMPLANT
SOL FOG-OUT ANTI-FOG 6CC (MISCELLANEOUS) ×1
TOWEL OR 17X26 4PK STRL BLUE (TOWEL DISPOSABLE) ×1 IMPLANT
TUBING CONNECTING 10 (TUBING) ×1 IMPLANT

## 2018-06-13 NOTE — Op Note (Signed)
06/13/2018  9:41 AM    Bufford Lope  [id number]   Pre-Op Dx: Bilateral chronic eustachian tube dysfunction  Post-op Dx: Same  Proc: Nasopharyngoscopy, bilateral outfracture of the inferior turbinates, eustachian tube balloon dilation  Surg:  Huey Romans  Anes: General by LMA  EBL: Minimal  Comp: None  Findings: The right turbinate have been partially reduced with previous septal and nasal surgery.  The left turbinate was definitely in the way and needed to be outfractured, and the distal remnant of the right turbinate was outfractured also.  The eustachian tube opening could be seen directly now through the endoscope.   Procedure: The patient was brought to the operating room and placed in supine position.  He was given general anesthesia by laryngeal mask..  Cottonoid pledgets were placed in both sides of the nose been soaked with phenylephrine and Xylocaine.  He had gotten some Afrin in the preop holding area sprayed in his nose.  This is allowed to sit while he was prepped and draped sterile fashion.  The cotton pledgets were then removed.  0 degrees scope was passed through the left nostril first visualized the nasopharynx.  There is little bit of numbing solution that was suctioned away.  The torus tubarius can be seen but slightly had an angle.  The scope was removed and there was outfracture done of the inferior turbinate.  This allowed better visualization directly of the torus tubarius and the opening to the eustachian tube.  The eustachian tube balloon was then passed through the nose under direct vision into the nasopharynx.  The tip of this was placed into the nasopharynx and the balloon was then passed up through the eustachian tube.  It was passed to the tube could not be passed no more.  There is about 1/4 inch of tube still sitting at the end of the eustachian tube opening.  The cuff was then inflated up to 8 cm of pressure and allowed to sit for 3 minutes.  At the end  of 3 minutes the cuff was deflated and the eustachian tube was removed.  There is no significant bleeding and the patient seemed to tolerate this well.  The scope was then put through the right side to visualize the nasopharynx on this side.  The inferior turbinate was previously partially reduced.  The distal portion of this was outfractured to get better visualization of the nasopharynx.  The eustachian tube balloon was then passed through the nose under direct vision to the nasopharynx.  The balloon was passed up into the eustachian tube its full length.  There is about 1/4 inch that set out at the end.  Cuff was inflated up to 8 cm of pressure and allowed to sit for 3 minutes.  Cuff was then deflated and removed.  There is no bleeding or signs of problems on the right side either.  Nasopharynx was clear.  Patient tolerated the procedure well.  He was awakened and taken to the recovery room in satisfactory condition.  There were no operative complications.  Dispo:   To PACU to be discharged home  Plan: To follow-up in the office in a couple weeks with an audiogram to make sure his ears are doing well.  He can use Tylenol or Advil for pain if needed.  Elon Alas Malyiah Fellows  06/13/2018 9:41 AM

## 2018-06-13 NOTE — Anesthesia Preprocedure Evaluation (Addendum)
Anesthesia Evaluation  Patient identified by MRN, date of birth, ID band Patient awake    Reviewed: Allergy & Precautions, NPO status , Patient's Chart, lab work & pertinent test results  Airway Mallampati: II  TM Distance: >3 FB     Dental   Pulmonary shortness of breath and with exertion, asthma , COPD, former smoker,    breath sounds clear to auscultation       Cardiovascular + CAD (s/p stent 2016) and + Past MI (2016)   Rhythm:Regular Rate:Normal  HLD  Cath 2018: 1. Insignificant coronary artery disease with patent stent distal RCA 2. Mildly reduced left ventricular function with posterior wall akinesis 3. Near normal right sided heart pressures with mean pulmonary arterial pressure 21 mmHg not diagnostic for pulmonary hypertension Rec: continue medical therapy   Neuro/Psych    GI/Hepatic GERD  ,  Endo/Other  diabetes, Type 2  Renal/GU    Hx bladder cancer    Musculoskeletal  (+) Arthritis ,   Abdominal   Peds  Hematology   Anesthesia Other Findings   Reproductive/Obstetrics                            Anesthesia Physical Anesthesia Plan  ASA: III  Anesthesia Plan: General   Post-op Pain Management:    Induction: Intravenous  PONV Risk Score and Plan: Ondansetron and Dexamethasone  Airway Management Planned: LMA  Additional Equipment:   Intra-op Plan:   Post-operative Plan:   Informed Consent: I have reviewed the patients History and Physical, chart, labs and discussed the procedure including the risks, benefits and alternatives for the proposed anesthesia with the patient or authorized representative who has indicated his/her understanding and acceptance.     Dental advisory given  Plan Discussed with: CRNA  Anesthesia Plan Comments:       Anesthesia Quick Evaluation

## 2018-06-13 NOTE — Transfer of Care (Signed)
Immediate Anesthesia Transfer of Care Note  Patient: Dustin Blackburn  Procedure(s) Performed: NASOPHARYNGOSCOPY EUSTATION TUBE BALLOON DILATION DIABETIC, NEED EUSTATION TUBE BALLOON SYSTEM (Bilateral Nose) TURBINATE OUTFRACTURE (Bilateral Nose)  Patient Location: PACU  Anesthesia Type: General  Level of Consciousness: awake, alert  and patient cooperative  Airway and Oxygen Therapy: Patient Spontanous Breathing and Patient connected to supplemental oxygen  Post-op Assessment: Post-op Vital signs reviewed, Patient's Cardiovascular Status Stable, Respiratory Function Stable, Patent Airway and No signs of Nausea or vomiting  Post-op Vital Signs: Reviewed and stable  Complications: No apparent anesthesia complications

## 2018-06-13 NOTE — Anesthesia Procedure Notes (Signed)
Procedure Name: LMA Insertion Date/Time: 06/13/2018 9:13 AM Performed by: Mayme Genta, CRNA Pre-anesthesia Checklist: Patient identified, Emergency Drugs available, Suction available, Timeout performed and Patient being monitored Patient Re-evaluated:Patient Re-evaluated prior to induction Oxygen Delivery Method: Circle system utilized Preoxygenation: Pre-oxygenation with 100% oxygen Induction Type: IV induction LMA: LMA inserted LMA Size: 4.0 Number of attempts: 1 Placement Confirmation: positive ETCO2 and breath sounds checked- equal and bilateral Tube secured with: Tape

## 2018-06-13 NOTE — Anesthesia Postprocedure Evaluation (Signed)
Anesthesia Post Note  Patient: Dustin Blackburn  Procedure(s) Performed: NASOPHARYNGOSCOPY EUSTATION TUBE BALLOON DILATION DIABETIC, NEED EUSTATION TUBE BALLOON SYSTEM (Bilateral Nose) TURBINATE OUTFRACTURE (Bilateral Nose)  Patient location during evaluation: PACU Anesthesia Type: General Level of consciousness: awake Pain management: pain level controlled Vital Signs Assessment: post-procedure vital signs reviewed and stable Respiratory status: respiratory function stable Cardiovascular status: stable Postop Assessment: no signs of nausea or vomiting Anesthetic complications: no    Veda Canning

## 2018-06-13 NOTE — H&P (Signed)
H&P has been reviewed and patient reevaluated, no changes necessary. To be downloaded later.  

## 2018-06-14 ENCOUNTER — Encounter: Payer: Self-pay | Admitting: Otolaryngology

## 2018-07-09 ENCOUNTER — Other Ambulatory Visit: Payer: Medicare HMO | Admitting: Urology

## 2018-07-11 ENCOUNTER — Other Ambulatory Visit: Payer: Self-pay

## 2018-07-11 DIAGNOSIS — C679 Malignant neoplasm of bladder, unspecified: Secondary | ICD-10-CM

## 2018-07-12 ENCOUNTER — Encounter: Payer: Self-pay | Admitting: Urology

## 2018-07-12 ENCOUNTER — Ambulatory Visit: Payer: Medicare HMO | Admitting: Urology

## 2018-07-12 ENCOUNTER — Other Ambulatory Visit
Admission: RE | Admit: 2018-07-12 | Discharge: 2018-07-12 | Disposition: A | Discharge status: Home / Self Care | Payer: Medicare HMO | Attending: Urology | Admitting: Urology

## 2018-07-12 VITALS — BP 128/81 | HR 77 | Ht 70.0 in | Wt 210.0 lb

## 2018-07-12 DIAGNOSIS — Z8551 Personal history of malignant neoplasm of bladder: Secondary | ICD-10-CM

## 2018-07-12 DIAGNOSIS — C679 Malignant neoplasm of bladder, unspecified: Secondary | ICD-10-CM | POA: Diagnosis present

## 2018-07-12 LAB — URINALYSIS, COMPLETE (UACMP) WITH MICROSCOPIC
Bacteria, UA: NONE SEEN
Bilirubin Urine: NEGATIVE
Glucose, UA: NEGATIVE mg/dL
Hgb urine dipstick: NEGATIVE
Ketones, ur: NEGATIVE mg/dL
Nitrite: NEGATIVE
Protein, ur: NEGATIVE mg/dL
Specific Gravity, Urine: 1.02 (ref 1.005–1.030)
Squamous Epithelial / HPF: NONE SEEN (ref 0–5)
pH: 5.5 (ref 5.0–8.0)

## 2018-07-12 NOTE — Progress Notes (Signed)
   07/12/18  CC:  Chief Complaint  Patient presents with  . Cysto    HPI: 78 year old male with a history of low-grade noninvasive bladder cancer returns today for routine surveillance cystoscopy.  He was initially found to have incidental bladder wall thickening on CT abdomen pelvis on 03/2016. Cystoscopy showed a papillary proximal and 1 cm tumor at the dome of the bladder. He was taken to the operating room on 06/12/2016 for TURBT, bilateral retrograde, and instillation of mitomycin.  Surgical pathology was consistent with low-grade, noninvasive tumor.  More recently, he returned to the operating room on 10/02/2017 for cystoscopy, bladder biopsy, bilateral retrograde pyelograms secondary to bladder erythema.    Surgical pathology was benign with evidence of squamous metaplasia and reactive changes, scarlike fibrosis, but no evidence of malignancy or dysplasia.  Last surveillance cystoscopy was 12/2017, NED.  Vitals:   07/12/18 0937  BP: 128/81  Pulse: 77   NED. A&Ox3.   No respiratory distress   Abd soft, NT, ND Normal phallus with bilateral descended testicles  Cystoscopy Procedure Note  Patient identification was confirmed, informed consent was obtained, and patient was prepped using Betadine solution.  Lidocaine jelly was administered per urethral meatus.    Preoperative abx where received prior to procedure.     Pre-Procedure: - Inspection reveals a normal caliber ureteral meatus.  Procedure: The flexible cystoscope was introduced without difficulty - No urethral strictures/lesions are present. - Enlarged prostate bilobar coaptation - Minimally elevated bladder neck - Bilateral ureteral orifices identified - Bladder mucosa  reveals no ulcers, tumors, or lesions.  There is a stellate scar appreciated in the left lateral bladder wall near the dome. - No bladder stones - Mild trabeculation  Retroflexion shows no significant median  lobe   Post-Procedure: - Patient tolerated the procedure well  Assessment/ Plan:  1. History of bladder cancer No evidence of disease Will continue surveillance cystoscopy, given that is not had a recurrence in over 2 years, we can transition to q. annually for surveillance cystoscopy  Return in about 1 year (around 07/13/2019) for cysto.  Hollice Espy, MD

## 2018-08-21 ENCOUNTER — Telehealth: Payer: Self-pay | Admitting: Urology

## 2018-08-21 ENCOUNTER — Other Ambulatory Visit: Payer: Self-pay | Admitting: Family Medicine

## 2018-08-21 DIAGNOSIS — Z87442 Personal history of urinary calculi: Secondary | ICD-10-CM

## 2018-08-21 DIAGNOSIS — R109 Unspecified abdominal pain: Secondary | ICD-10-CM

## 2018-08-21 NOTE — Telephone Encounter (Signed)
Spoke to patient his symptoms started yesterday. He has a history of kidney stones and his only complaint is flank pain and headache. I ordered a KUB and informed him we would look at that when It comes back and let him know what we will do.

## 2018-08-21 NOTE — Telephone Encounter (Signed)
Please call the patient in triage with questions including when did pain start, is it severe, what urinary symptoms as he is having, etc.  Hollice Espy, MD

## 2018-08-21 NOTE — Telephone Encounter (Signed)
Pt called and states he is having sharp right low back pain. He states he feels like he has a UTI. He denies fever, nausea or vomiting. Please advise

## 2018-08-22 ENCOUNTER — Other Ambulatory Visit: Payer: Self-pay

## 2018-08-22 ENCOUNTER — Ambulatory Visit
Admission: RE | Admit: 2018-08-22 | Discharge: 2018-08-22 | Disposition: A | Discharge status: Home / Self Care | Payer: Medicare HMO | Attending: Urology | Admitting: Urology

## 2018-08-22 ENCOUNTER — Ambulatory Visit
Admission: RE | Admit: 2018-08-22 | Discharge: 2018-08-22 | Disposition: A | Discharge status: Home / Self Care | Payer: Medicare HMO | Source: Ambulatory Visit | Attending: Urology | Admitting: Urology

## 2018-08-22 ENCOUNTER — Telehealth: Payer: Self-pay

## 2018-08-22 DIAGNOSIS — R109 Unspecified abdominal pain: Secondary | ICD-10-CM

## 2018-08-22 DIAGNOSIS — Z87442 Personal history of urinary calculi: Secondary | ICD-10-CM

## 2018-08-22 DIAGNOSIS — N200 Calculus of kidney: Secondary | ICD-10-CM | POA: Insufficient documentation

## 2018-08-22 NOTE — Telephone Encounter (Signed)
Patient and wife notified.  

## 2018-08-22 NOTE — Telephone Encounter (Signed)
-----   Message from Hollice Espy, MD sent at 08/22/2018  1:34 PM EDT ----- No ureteral stones are seen.  If he continues to have flank pain, let us know we can order a CT scan.  Encourage hydration.  Please review indications for emergency room care.  Hollice Espy, MD

## 2018-08-23 ENCOUNTER — Telehealth: Payer: Self-pay | Admitting: Urology

## 2018-08-23 NOTE — Telephone Encounter (Signed)
Pt called and is still having sharp pain and wants to know if he can still have a CT. Please advise.

## 2018-08-26 ENCOUNTER — Telehealth: Payer: Self-pay | Admitting: Urology

## 2018-08-26 ENCOUNTER — Other Ambulatory Visit: Payer: Self-pay | Admitting: Family Medicine

## 2018-08-26 DIAGNOSIS — R109 Unspecified abdominal pain: Secondary | ICD-10-CM

## 2018-08-26 NOTE — Telephone Encounter (Addendum)
Please call and check on this patient.  I received this phone note after leaving Friday.  No stone was seen on KUB last week.He does have know stones bilaterally.  He did have similar pain last 02/2018 at which time CT scan showed no ureteral stones.   If he is continuing to have severe pain today, please order CT stone protocol.  Hollice Espy, MD

## 2018-08-26 NOTE — Telephone Encounter (Signed)
CT scan has been approved and Dustin Blackburn from scheduling will contact the patient to schedule   Dustin Blackburn

## 2018-08-26 NOTE — Telephone Encounter (Addendum)
Spoke to patient and he is still in severe pain at times. He is wanting to do the CT scan. Order placed

## 2018-08-27 ENCOUNTER — Ambulatory Visit
Admission: RE | Admit: 2018-08-27 | Discharge: 2018-08-27 | Disposition: A | Discharge status: Home / Self Care | Payer: Medicare HMO | Source: Ambulatory Visit | Attending: Urology | Admitting: Urology

## 2018-08-27 ENCOUNTER — Telehealth: Payer: Self-pay

## 2018-08-27 ENCOUNTER — Other Ambulatory Visit: Payer: Self-pay

## 2018-08-27 DIAGNOSIS — R109 Unspecified abdominal pain: Secondary | ICD-10-CM | POA: Diagnosis not present

## 2018-08-27 NOTE — Telephone Encounter (Signed)
-----   Message from Hollice Espy, MD sent at 08/27/2018 10:56 AM EDT ----- No ureteral stones.  Please schedule a virtual visit for next week to discuss findings.  Hollice Espy, MD

## 2018-08-27 NOTE — Telephone Encounter (Signed)
Called pt informed him of the information below. Pt gave verbal understanding.  

## 2019-01-01 ENCOUNTER — Ambulatory Visit
Admit: 2019-01-01 | Discharge: 2019-01-02 | Disposition: A | Discharge status: Home / Self Care | Payer: MEDICARE | Attending: Emergency Medicine

## 2019-01-01 MED ORDER — DOCUSATE SODIUM 100 MG CAPSULE
ORAL_CAPSULE | Freq: Two times a day (BID) | ORAL | 0 refills | 30.00000 days | Status: CP
Start: 2019-01-01 — End: 2019-01-31

## 2019-01-01 MED ORDER — HYDROCORTISONE 1 % TOPICAL CREAM
0 refills | 0 days | Status: CP
Start: 2019-01-01 — End: 2020-01-01

## 2019-01-02 MED ORDER — HYDROCORTISONE 1 % EX CREA
TOPICAL_CREAM | CUTANEOUS | Status: DC
Start: 2019-01-02 — End: 2019-01-02

## 2019-01-09 ENCOUNTER — Telehealth: Payer: Self-pay | Admitting: Urology

## 2019-01-09 NOTE — Telephone Encounter (Signed)
This pt is scheduled to see you Monday for ED follow up, do you think this warrants being moved up or should I just tell them to hang tight, I don't think weakness or headache is related to cath removal.

## 2019-01-09 NOTE — Telephone Encounter (Signed)
Pt's wife called and states the pt is having freq urination weakness and headache. She states it started after he took his cath out. Please advise.

## 2019-01-10 ENCOUNTER — Ambulatory Visit: Payer: Medicare HMO | Admitting: Urology

## 2019-01-10 ENCOUNTER — Encounter: Payer: Self-pay | Admitting: Urology

## 2019-01-10 VITALS — BP 143/82 | HR 90 | Ht 70.0 in | Wt 220.0 lb

## 2019-01-10 DIAGNOSIS — Z87898 Personal history of other specified conditions: Secondary | ICD-10-CM | POA: Diagnosis not present

## 2019-01-10 DIAGNOSIS — R300 Dysuria: Secondary | ICD-10-CM | POA: Diagnosis not present

## 2019-01-10 DIAGNOSIS — Z8551 Personal history of malignant neoplasm of bladder: Secondary | ICD-10-CM

## 2019-01-10 DIAGNOSIS — R350 Frequency of micturition: Secondary | ICD-10-CM | POA: Diagnosis not present

## 2019-01-10 DIAGNOSIS — R310 Gross hematuria: Secondary | ICD-10-CM

## 2019-01-10 LAB — MICROSCOPIC EXAMINATION: WBC, UA: >30 /hpf — AB (ref 0–5)

## 2019-01-10 LAB — URINALYSIS, COMPLETE
Bilirubin, UA: NEGATIVE
Ketones, UA: NEGATIVE
Nitrite, UA: POSITIVE — AB
Specific Gravity, UA: 1.01 (ref 1.005–1.030)
Urobilinogen, Ur: 1 mg/dL (ref 0.2–1.0)
pH, UA: 5.5 (ref 5.0–7.5)

## 2019-01-10 LAB — BLADDER SCAN AMB NON-IMAGING: Scan Result: 27

## 2019-01-10 MED ORDER — CEPHALEXIN 500 MG PO CAPS: 500.0000 mg | ORAL_CAPSULE | Freq: Two times a day (BID) | ORAL | 0 refills | Status: DC

## 2019-01-10 NOTE — Progress Notes (Signed)
01/10/2019 9:55 PM   Dustin Blackburn 03-Jun-1940 [id number]  Referring provider: Ricardo Jericho, NP [address] [place],  [place] [address]  Chief Complaint  Patient presents with  . Urinary Frequency  . Dysuria    HPI: Mr. Flight is a 78 year old male with nephrolithiasis, history of bladder cancer who had an episode of urinary retention who presents today for evaluation.   He presented to the [facility] ED on 01/01/2019 with the complaint of no voiding through the night.  Bladder scan was > 500 cc.     Labs in the ED:  WBC count 9.9, serum creatinine 0.89 and UA 4 WBC's   Meds given in the ED: Colace and hydrocortisone cream   Prior urological history:  Hx of bladder cancer - due for surveillance cystoscopy in 06/2019   Current NSAID/anticoagulation:   ASA and  Mobic   Today, he is experiencing frequency, urgency, dysuria, nocturia and gross hematuria.  He states that he is voiding every 15 minutes and it is burning with urination.  He states he had gross hematuria after the catheter was removed, but the gross hematuria has resolved.  He remove the catheter himself on August 11 without difficulty.  Patient denies suprapubic/flank pain.  Patient denies any fevers, chills, nausea or vomiting.   He thinks the episode of retention was brought on by constipation.  He states he is having bowel movements at this time.  His UA today is nitrate positive, positive for greater than 30 WBCs, 11-30 RBCs and moderate bacteria.  Her PVR is 97 mL.    PMH: Past Medical History:  Diagnosis Date  . Arthritis   . Asthma   . Cancer ([place]) 2018   bladder  . Cold   . COPD (chronic obstructive pulmonary disease) ([place])   . Coronary artery disease   . DDD (degenerative disc disease), cervical   . Diabetes mellitus without complication ([place])   . Dyspnea   . GERD (gastroesophageal reflux disease)   . Hearing loss associated with syndrome of both ears   . Heart attack ([place]) 2016  . History  of aspiration pneumonitis   . History of kidney stones   . Hyperlipidemia   . Kidney cysts   . Kidney stones   . Osteoarthritis of back   . Psoriasis   . Psoriasis   . Seasonal allergies     Surgical History: Past Surgical History:  Procedure Laterality Date  . CARDIAC CATHETERIZATION  2016 and 2018  . COLONOSCOPY    . COLONOSCOPY WITH PROPOFOL N/A 09/18/2017   Procedure: COLONOSCOPY WITH PROPOFOL;  Surgeon: Lollie Sails, MD;  Location[facility] ENDOSCOPY;  Service: Endoscopy;  Laterality: N/A;  . CORONARY ANGIOPLASTY  2016  . CORONARY STENT PLACEMENT  2016  . CYSTOSCOPY W/ RETROGRADES Bilateral 06/12/2016   Procedure: CYSTOSCOPY WITH RETROGRADE PYELOGRAM;  Surgeon: Hollice Espy, MD;  Location: [facility] ORS;  Service: Urology;  Laterality: Bilateral;  . CYSTOSCOPY W/ RETROGRADES Bilateral 10/02/2017   Procedure: CYSTOSCOPY WITH RETROGRADE PYELOGRAM;  Surgeon: Hollice Espy, MD;  Location: [facility] ORS;  Service: Urology;  Laterality: Bilateral;  . CYSTOSCOPY WITH BIOPSY N/A 10/02/2017   Procedure: CYSTOSCOPY WITH Bladder BIOPSY;  Surgeon: Hollice Espy, MD;  Location: [facility] ORS;  Service: Urology;  Laterality: N/A;  . CYSTOSCOPY WITH URETEROSCOPY Right 10/02/2017   Procedure: DIAGNOSTIC RIGHT URETEROSCOPY;  Surgeon: Hollice Espy, MD;  Location: [facility] ORS;  Service: Urology;  Laterality: Right;  . ESOPHAGOGASTRODUODENOSCOPY (EGD) WITH PROPOFOL N/A  09/18/2017   Procedure: ESOPHAGOGASTRODUODENOSCOPY (EGD) WITH PROPOFOL;  Surgeon: Lollie Sails, MD;  Location: [facility] ENDOSCOPY;  Service: Endoscopy;  Laterality: N/A;  . fatty tumors removed    . HIATAL HERNIA REPAIR  1992  . LIPOMA EXCISION     right leg  . NASOPHARYNGOSCOPY EUSTATION TUBE BALLOON DILATION Bilateral 06/13/2018   Procedure: NASOPHARYNGOSCOPY EUSTATION TUBE BALLOON DILATION DIABETIC, NEED EUSTATION TUBE BALLOON SYSTEM;  Surgeon: Margaretha Sheffield, MD;  Location: [place];  Service: ENT;  Laterality: Bilateral;  .  RIGHT/LEFT HEART CATH AND CORONARY ANGIOGRAPHY N/A 12/21/2016   Procedure: Right/Left Heart Cath and Coronary Angiography;  Surgeon: Isaias Cowman, MD;  Location: [place] CV LAB;  Service: Cardiovascular;  Laterality: N/A;  . SINUS EXPLORATION  1992  . TRANSURETHRAL RESECTION OF BLADDER TUMOR WITH MITOMYCIN-C N/A 06/12/2016   Procedure: TRANSURETHRAL RESECTION OF BLADDER TUMOR WITH MITOMYCIN-C;  Surgeon: Hollice Espy, MD;  Location: [facility] ORS;  Service: Urology;  Laterality: N/A;  . TURBINATE REDUCTION Bilateral 06/13/2018   Procedure: Orrin Brigham;  Surgeon: Margaretha Sheffield, MD;  Location: [place];  Service: ENT;  Laterality: Bilateral;  Diabetic - insulin  . UMBILICAL HERNIA REPAIR N/A 06/12/2016   Procedure: HERNIA REPAIR UMBILICAL ADULT;  Surgeon: Christene Lye, MD;  Location: [facility] ORS;  Service: General;  Laterality: N/A;  . URETEROSCOPY Right 06/12/2016   Procedure: URETEROSCOPY;  Surgeon: Hollice Espy, MD;  Location: [facility] ORS;  Service: Urology;  Laterality: Right;    Home Medications:  Allergies as of 01/10/2019      Reactions   Actos [pioglitazone] Palpitations   Pt reported a MI    Glimepiride Rash   Glipizide Shortness Of Breath   Metformin Shortness Of Breath   Pt states he "can't breathe and it gives him sinus congestion".   Amoxicillin Nausea Only   Erythromycin Base Nausea Only   Esomeprazole Magnesium Other (See Comments)   Makes patients heart burn worse   Lipitor [atorvastatin]    indigestion   Liraglutide Other (See Comments)   bloating   Penicillins Nausea Only   Has patient had a PCN reaction causing immediate rash, facial/tongue/throat swelling, SOB or lightheadedness with hypotension: no Has patient had a PCN reaction causing severe rash involving mucus membranes or skin necrosis: no Has patient had a PCN reaction that required hospitalization:no Has patient had a PCN reaction occurring within the last 10 years: no If all  of the above answers are "NO", then may proceed with Cephalosporin use.   Sitagliptin Other (See Comments)   Pt states it "gives him joint pain" & redness      Medication List       Accurate as of January 10, 2019 11:59 PM. If you have any questions, ask your nurse or doctor.        STOP taking these medications   B-12 PO Stopped by: Glen Kesinger, PA-C   insulin NPH-regular Human (70-30) 100 UNIT/ML injection Stopped by: Kelda Azad, PA-C   ipratropium-albuterol 0.5-2.5 (3) MG/3ML Soln Commonly known as: DUONEB Stopped by: Kamyla Olejnik, PA-C   metoCLOPramide 5 MG tablet Commonly known as: REGLAN Stopped by: Sartaj Hoskin, PA-C   montelukast 10 MG tablet Commonly known as: SINGULAIR Stopped by: Maykayla Highley, PA-C   NON FORMULARY Stopped by: Zahir Eisenhour, PA-C   sucralfate 1 g tablet Commonly known as: CARAFATE Stopped by: Lorrie Gargan, PA-C   tamsulosin 0.4 MG Caps capsule Commonly known as: FLOMAX Stopped by: Zara Council, PA-C     TAKE  these medications   albuterol 108 (90 Base) MCG/ACT inhaler Commonly known as: VENTOLIN HFA Inhale into the lungs every 6 (six) hours as needed for wheezing or shortness of breath. What changed: Another medication with the same name was removed. Continue taking this medication, and follow the directions you see here. Changed by: Zara Council, PA-C   AndroGel 50 MG/5GM (1%) Gel Generic drug: testosterone Place 5 g onto the skin daily.   aspirin EC 81 MG tablet Take 81 mg by mouth daily.   cephALEXin 500 MG capsule Commonly known as: Keflex Take 1 capsule (500 mg total) by mouth 2 (two) times daily. Started by: Zara Council, PA-C   docusate sodium 100 MG capsule Commonly known as: COLACE Take by mouth.   fluocinonide 0.05 % external solution Commonly known as: LIDEX Apply 1 application topically 2 (two) times daily as needed (psoriasis).   hydrocortisone cream 1 % Apply to affected area  2 times daily   insulin NPH Human 100 UNIT/ML injection Commonly known as: NOVOLIN N Inject 10 Units into the skin 2 (two) times daily.   meloxicam 15 MG tablet Commonly known as: MOBIC Take 1 tablet by mouth once daily   ReliOn Pen Needles 31G X 6 MM Misc Generic drug: Insulin Pen Needle   Symbicort 160-4.5 MCG/ACT inhaler Generic drug: budesonide-formoterol INHALE 2 PUFFS BY MOUTH INTO LUNGS TWICE DAILY   tiZANidine 2 MG tablet Commonly known as: ZANAFLEX TAKE 1 TABLET BY MOUTH NIGHTLY AS NEEDED   triamcinolone cream 0.1 % Commonly known as: KENALOG Apply 1 application topically 2 (two) times daily as needed (psoriasis).       Allergies:  Allergies  Allergen Reactions  . Actos [Pioglitazone] Palpitations    Pt reported a MI   . Glimepiride Rash  . Glipizide Shortness Of Breath  . Metformin Shortness Of Breath    Pt states he "can't breathe and it gives him sinus congestion".   . Amoxicillin Nausea Only  . Erythromycin Base Nausea Only  . Esomeprazole Magnesium Other (See Comments)    Makes patients heart burn worse  . Lipitor [Atorvastatin]     indigestion  . Liraglutide Other (See Comments)    bloating  . Penicillins Nausea Only    Has patient had a PCN reaction causing immediate rash, facial/tongue/throat swelling, SOB or lightheadedness with hypotension: no Has patient had a PCN reaction causing severe rash involving mucus membranes or skin necrosis: no Has patient had a PCN reaction that required hospitalization:no Has patient had a PCN reaction occurring within the last 10 years: no If all of the above answers are "NO", then may proceed with Cephalosporin use.   . Sitagliptin Other (See Comments)    Pt states it "gives him joint pain" & redness     Family History: Family History  Problem Relation Age of Onset  . Cancer Mother   . Prostate cancer Maternal Uncle   . Prostate cancer Paternal Uncle   . Prostate cancer Cousin   . Congestive Heart  Failure Father   . Kidney cancer Neg Hx   . Bladder Cancer Neg Hx     Social History:  reports that he quit smoking about 28 years ago. His smoking use included cigarettes. He quit after 30.00 years of use. He has quit using smokeless tobacco.  His smokeless tobacco use included chew. He reports current alcohol use. He reports that he does not use drugs.  ROS: UROLOGY Frequent Urination?: Yes Hard to postpone urination?: Yes Burning/pain with  urination?: Yes Get up at night to urinate?: Yes Leakage of urine?: No Urine stream starts and stops?: No Trouble starting stream?: No Do you have to strain to urinate?: No Blood in urine?: Yes Urinary tract infection?: No Sexually transmitted disease?: No Injury to kidneys or bladder?: No Painful intercourse?: No Weak stream?: No Erection problems?: No Penile pain?: No  Gastrointestinal Nausea?: No Vomiting?: No Indigestion/heartburn?: Yes Diarrhea?: No Constipation?: No  Constitutional Fever: No Night sweats?: No Weight loss?: No Fatigue?: Yes  Skin Skin rash/lesions?: No Itching?: No  Eyes Blurred vision?: No Double vision?: No  Ears/Nose/Throat Sore throat?: No Sinus problems?: No  Hematologic/Lymphatic Swollen glands?: No Easy bruising?: No  Cardiovascular Leg swelling?: No Chest pain?: No  Respiratory Cough?: No Shortness of breath?: No  Endocrine Excessive thirst?: No  Musculoskeletal Back pain?: No Joint pain?: No  Neurological Headaches?: Yes Dizziness?: No  Psychologic Depression?: No Anxiety?: No  Physical Exam: BP (!) 143/82 (BP Location: Left Arm, Patient Position: Sitting, Cuff Size: Normal)   Pulse 90   Ht 5\' 10"  (1.778 m)   Wt 220 lb (99.8 kg)   BMI 31.57 kg/m   Constitutional:  Well nourished. Alert and oriented, No acute distress. HEENT: [place] AT, moist mucus membranes.  Trachea midline, no masses. Cardiovascular: No clubbing, cyanosis, or edema. Respiratory: Normal  respiratory effort, no increased work of breathing. Neurologic: Grossly intact, no focal deficits, moving all 4 extremities. Psychiatric: Normal mood and affect.  Laboratory Data: Lab Results  Component Value Date   WBC 10.5 06/02/2016   HGB 15.8 06/02/2016   HCT 46.5 06/02/2016   MCV 91.4 06/02/2016   PLT 260 06/02/2016    Lab Results  Component Value Date   CREATININE 0.80 06/02/2016    No results found for: PSA  No results found for: TESTOSTERONE  No results found for: HGBA1C  No results found for: TSH  No results found for: CHOL, HDL, CHOLHDL, VLDL, LDLCALC  No results found for: AST No results found for: ALT No components found for: ALKALINEPHOPHATASE No components found for: BILIRUBINTOTAL  No results found for: ESTRADIOL  Urinalysis Component     Latest Ref Rng & Units 01/10/2019  Specific Gravity, UA     1.005 - 1.030 1.010  pH, UA     5.0 - 7.5 5.5  Color, UA     Yellow Yellow  Appearance Ur     Clear Cloudy (A)  Leukocytes,UA     Negative 2+ (A)  Protein,UA     Negative/Trace 2+ (A)  Glucose, UA     Negative 2+ (A)  Ketones, UA     Negative Negative  RBC, UA     Negative 3+ (A)  Bilirubin, UA     Negative Negative  Urobilinogen, Ur     0.2 - 1.0 mg/dL 1.0  Nitrite, UA     Negative Positive (A)  Microscopic Examination      See below:   Component     Latest Ref Rng & Units 01/10/2019  WBC, UA     0 - 5 /hpf >30 (A)  RBC     0 - 2 /hpf 11-30 (A)  Epithelial Cells (non renal)     0 - 10 /hpf 0-10  Renal Epithel, UA     None seen /hpf 0-10 (A)  Bacteria, UA     None seen/Few Moderate (A)    I have reviewed the labs.   Pertinent Imaging: Results for HERSCHELL, VIRANI" (MRN [id number])  as of 01/10/2019 10:23  Ref. Range 01/10/2019 08:30  Scan Result Unknown 27     Assessment & Plan:    1. Urinary frequency -UA suspicious for infection, will start Keflex 500 mg, twice daily, urine is sent for cultures and will adjust  antibiotic if necessary - Bladder Scan (Post Void Residual) in office  2. Dysuria - see above - Urinalysis, Complete  3. History of retention - PVR minimal today - I PSS, exam and PVR in one month  4. Gross hematuria - likely due to Foley trauma and UTI - recheck UA in one month  5. History of bladder cancer - appt. 06/2019 for cystoscopy    Return in about 1 month (around 02/10/2019) for UA, I PSS, exam and PVR .  These notes generated with voice recognition software. I apologize for typographical errors.  Zara Council, PA-C  [facility] [address]  [place], [place] [address] [phone]

## 2019-01-12 LAB — CULTURE, URINE COMPREHENSIVE

## 2019-01-13 ENCOUNTER — Other Ambulatory Visit: Payer: Self-pay | Admitting: Urology

## 2019-01-13 ENCOUNTER — Telehealth: Payer: Self-pay

## 2019-01-13 ENCOUNTER — Ambulatory Visit: Payer: Medicare HMO | Admitting: Urology

## 2019-01-13 DIAGNOSIS — N390 Urinary tract infection, site not specified: Secondary | ICD-10-CM

## 2019-01-13 DIAGNOSIS — B9620 Unspecified Escherichia coli [E. coli] as the cause of diseases classified elsewhere: Secondary | ICD-10-CM

## 2019-01-13 MED ORDER — CIPROFLOXACIN HCL 250 MG PO TABS: 250.0000 mg | ORAL_TABLET | Freq: Two times a day (BID) | ORAL | 0 refills | Status: DC

## 2019-01-13 NOTE — Telephone Encounter (Signed)
Patient notified and voiced understanding. Patient states he is not taking the Zanaflex at this time

## 2019-01-13 NOTE — Telephone Encounter (Signed)
I have sent the prescription in for the Cipro.  He cannot take the tizanidine (Zanaflex) -muscle relaxer- while he is on the Cipro.  It can make him severely dizzy and hypotensive.

## 2019-01-13 NOTE — Telephone Encounter (Signed)
See previous telephone encounter.

## 2019-01-13 NOTE — Telephone Encounter (Signed)
Pt would like to have Cipro sent in to [facility] in [place].

## 2019-01-13 NOTE — Telephone Encounter (Signed)
-----   Message from Nori Riis, PA-C sent at 01/13/2019  7:54 AM EDT ----- Please let Mr. Bady know that his urine culture was positive for infection and the Keflex that he was prescribed should clear the infection.  We will see him on the 14th for a recheck.

## 2019-01-13 NOTE — Telephone Encounter (Signed)
Called pt he states that he is unable to tolerate the Keflex. He states that he has caused him to have eye pain and a severe headache. Pt tried tylenol, however there was no relief. Please advise.

## 2019-01-13 NOTE — Telephone Encounter (Signed)
Pt wife calling on behalf of pt stating pt was seen by Larene Beach on Friday, states abx given to pt is causing headache, nausea and eye sight is blurry. Please advise. Thanks.

## 2019-01-13 NOTE — Telephone Encounter (Signed)
We could switch him to Cipro 250 mg BID x 7 days.  He needs to keep an eye on his sugars while on the medication as it could cause them to go lower.

## 2019-02-10 ENCOUNTER — Ambulatory Visit: Payer: Medicare HMO | Admitting: Urology

## 2019-03-24 NOTE — Telephone Encounter (Signed)
Dustin Blackburn has seen his PCP in September and was found to have no hematuria on dip and urine culture was negative.  He has since stopped the Cipro.

## 2019-04-21 ENCOUNTER — Emergency Department: Payer: Medicare HMO

## 2019-04-21 ENCOUNTER — Emergency Department
Admission: EM | Admit: 2019-04-21 | Discharge: 2019-04-21 | Disposition: A | Discharge status: Home / Self Care | Payer: Medicare HMO | Attending: Emergency Medicine | Admitting: Emergency Medicine

## 2019-04-21 ENCOUNTER — Other Ambulatory Visit: Payer: Self-pay

## 2019-04-21 ENCOUNTER — Encounter: Payer: Self-pay | Admitting: Emergency Medicine

## 2019-04-21 DIAGNOSIS — Z87891 Personal history of nicotine dependence: Secondary | ICD-10-CM | POA: Insufficient documentation

## 2019-04-21 DIAGNOSIS — Z8551 Personal history of malignant neoplasm of bladder: Secondary | ICD-10-CM | POA: Diagnosis not present

## 2019-04-21 DIAGNOSIS — R0789 Other chest pain: Secondary | ICD-10-CM

## 2019-04-21 DIAGNOSIS — Z955 Presence of coronary angioplasty implant and graft: Secondary | ICD-10-CM | POA: Insufficient documentation

## 2019-04-21 DIAGNOSIS — E119 Type 2 diabetes mellitus without complications: Secondary | ICD-10-CM | POA: Diagnosis not present

## 2019-04-21 DIAGNOSIS — I252 Old myocardial infarction: Secondary | ICD-10-CM | POA: Diagnosis not present

## 2019-04-21 DIAGNOSIS — J449 Chronic obstructive pulmonary disease, unspecified: Secondary | ICD-10-CM | POA: Diagnosis not present

## 2019-04-21 DIAGNOSIS — M546 Pain in thoracic spine: Secondary | ICD-10-CM | POA: Insufficient documentation

## 2019-04-21 DIAGNOSIS — R079 Chest pain, unspecified: Secondary | ICD-10-CM | POA: Diagnosis present

## 2019-04-21 LAB — BASIC METABOLIC PANEL
Anion gap: 11 (ref 5–15)
BUN: 25 mg/dL — ABNORMAL HIGH (ref 8–23)
CO2: 25 mmol/L (ref 22–32)
Calcium: 8.9 mg/dL (ref 8.9–10.3)
Chloride: 102 mmol/L (ref 98–111)
Creatinine, Ser: 0.88 mg/dL (ref 0.61–1.24)
GFR calc Af Amer: >60 mL/min (ref >60)
GFR calc non Af Amer: >60 mL/min (ref >60)
Glucose, Bld: 183 mg/dL — ABNORMAL HIGH (ref 70–99)
Potassium: 4.2 mmol/L (ref 3.5–5.1)
Sodium: 138 mmol/L (ref 135–145)

## 2019-04-21 LAB — TROPONIN I (HIGH SENSITIVITY)
Troponin I (High Sensitivity): 5 ng/L (ref <18)
Troponin I (High Sensitivity): 5 ng/L (ref <18)

## 2019-04-21 LAB — CBC
HCT: 36.8 % — ABNORMAL LOW (ref 39.0–52.0)
Hemoglobin: 12.3 g/dL — ABNORMAL LOW (ref 13.0–17.0)
MCH: 30.4 pg (ref 26.0–34.0)
MCHC: 33.4 g/dL (ref 30.0–36.0)
MCV: 91.1 fL (ref 80.0–100.0)
Platelets: 190 10*3/uL (ref 150–400)
RBC: 4.04 MIL/uL — ABNORMAL LOW (ref 4.22–5.81)
RDW: 12.4 % (ref 11.5–15.5)
WBC: 8.3 10*3/uL (ref 4.0–10.5)
nRBC: 0 % (ref 0.0–0.2)

## 2019-04-21 MED ORDER — MORPHINE SULFATE (PF) 4 MG/ML IV SOLN
4.0000 mg | Freq: Once | INTRAVENOUS | Status: AC
  Administered 2019-04-21: 4 mg via INTRAVENOUS
  Filled 2019-04-21: qty 1

## 2019-04-21 MED ORDER — OXYCODONE-ACETAMINOPHEN 5-325 MG PO TABS: 1.0000 | ORAL_TABLET | Freq: Three times a day (TID) | ORAL | 0 refills | Status: DC | PRN

## 2019-04-21 MED ORDER — OXYCODONE-ACETAMINOPHEN 5-325 MG PO TABS
2.0000 | ORAL_TABLET | Freq: Once | ORAL | Status: AC
  Administered 2019-04-21: 2 via ORAL
  Filled 2019-04-21: qty 2

## 2019-04-21 MED ORDER — IOHEXOL 350 MG/ML SOLN
75.0000 mL | Freq: Once | INTRAVENOUS | Status: AC | PRN
  Administered 2019-04-21: 13:00:00 75 mL via INTRAVENOUS

## 2019-04-21 NOTE — ED Notes (Signed)
NAD noted at time of D/C. Pt denies questions or concerns. Pt taken to the lobby via wheelchair by this RN at this time.

## 2019-04-21 NOTE — ED Triage Notes (Signed)
Pt reports chest pain that radiates through to his back for 2 days. Pt reports pain is sharp like. Pt denies [place] or SOB and states the ache is always there but there are intermittent stabs of sharp pain. Pt reports has been trying muscle relaxer's with it with little relief.

## 2019-04-21 NOTE — ED Provider Notes (Signed)
[facility] Emergency Department Provider Note       Time seen: ----------------------------------------- 12:36 PM on 04/21/2019 -----------------------------------------   I have reviewed the triage vital signs and the nursing notes.  HISTORY   Chief Complaint Chest Pain and Back Pain    HPI Dustin Blackburn is a 78 y.o. male with a history of arthritis, asthma, bladder cancer, COPD, diabetes, GERD, hyperlipidemia who presents to the ED for chest pain that radiates through to his back for 2 days.  Patient reports pain is sharp, denies shortness of breath.  Patient reports trying muscle relaxers with very little relief.  Past Medical History:  Diagnosis Date  . Arthritis   . Asthma   . Cancer ([place]) 2018   bladder  . Cold   . COPD (chronic obstructive pulmonary disease) ([place])   . Coronary artery disease   . DDD (degenerative disc disease), cervical   . Diabetes mellitus without complication ([place])   . Dyspnea   . GERD (gastroesophageal reflux disease)   . Hearing loss associated with syndrome of both ears   . Heart attack ([place]) 2016  . History of aspiration pneumonitis   . History of kidney stones   . Hyperlipidemia   . Kidney cysts   . Kidney stones   . Osteoarthritis of back   . Psoriasis   . Psoriasis   . Seasonal allergies     Patient Active Problem List   Diagnosis Date Noted  . Preop cardiovascular exam 09/28/2017  . Unstable angina pectoris ([place]) 12/21/2016  . CAFL (chronic airflow limitation) ([place]) 02/22/2015  . Bilateral hearing loss 02/22/2015  . ST elevation (STEMI) myocardial infarction ([place]) 12/31/2014  . DDD (degenerative disc disease), cervical 08/28/2014  . HLD (hyperlipidemia) 01/13/2014  . Hay fever 05/14/2012  . Brachial neuritis 10/20/2010  . Psoriasis 08/06/2009  . Diabetes mellitus, type 2 ([place]) 07/15/2009  . Adiposity 07/30/2008  . Sinusitis, chronic 05/01/2007  . Airway hyperreactivity 08/14/2000  .  Atherosclerosis of coronary artery 08/14/2000  . Acid reflux 08/14/2000    Past Surgical History:  Procedure Laterality Date  . CARDIAC CATHETERIZATION  2016 and 2018  . COLONOSCOPY    . COLONOSCOPY WITH PROPOFOL N/A 09/18/2017   Procedure: COLONOSCOPY WITH PROPOFOL;  Surgeon: Lollie Sails, MD;  Location: [facility] ENDOSCOPY;  Service: Endoscopy;  Laterality: N/A;  . CORONARY ANGIOPLASTY  2016  . CORONARY STENT PLACEMENT  2016  . CYSTOSCOPY W/ RETROGRADES Bilateral 06/12/2016   Procedure: CYSTOSCOPY WITH RETROGRADE PYELOGRAM;  Surgeon: Hollice Espy, MD;  Location: [facility] ORS;  Service: Urology;  Laterality: Bilateral;  . CYSTOSCOPY W/ RETROGRADES Bilateral 10/02/2017   Procedure: CYSTOSCOPY WITH RETROGRADE PYELOGRAM;  Surgeon: Hollice Espy, MD;  Location: [facility] ORS;  Service: Urology;  Laterality: Bilateral;  . CYSTOSCOPY WITH BIOPSY N/A 10/02/2017   Procedure: CYSTOSCOPY WITH Bladder BIOPSY;  Surgeon: Hollice Espy, MD;  Location: [facility] ORS;  Service: Urology;  Laterality: N/A;  . CYSTOSCOPY WITH URETEROSCOPY Right 10/02/2017   Procedure: DIAGNOSTIC RIGHT URETEROSCOPY;  Surgeon: Hollice Espy, MD;  Location: [facility] ORS;  Service: Urology;  Laterality: Right;  . ESOPHAGOGASTRODUODENOSCOPY (EGD) WITH PROPOFOL N/A 09/18/2017   Procedure: ESOPHAGOGASTRODUODENOSCOPY (EGD) WITH PROPOFOL;  Surgeon: Lollie Sails, MD;  Location: [facility] ENDOSCOPY;  Service: Endoscopy;  Laterality: N/A;  . fatty tumors removed    . HIATAL HERNIA REPAIR  1992  . LIPOMA EXCISION     right leg  . NASOPHARYNGOSCOPY EUSTATION TUBE BALLOON DILATION Bilateral 06/13/2018   Procedure: NASOPHARYNGOSCOPY EUSTATION TUBE  BALLOON DILATION DIABETIC, NEED EUSTATION TUBE BALLOON SYSTEM;  Surgeon: Margaretha Sheffield, MD;  Location: [place];  Service: ENT;  Laterality: Bilateral;  . RIGHT/LEFT HEART CATH AND CORONARY ANGIOGRAPHY N/A 12/21/2016   Procedure: Right/Left Heart Cath and Coronary Angiography;  Surgeon: Isaias Cowman, MD;  Location: [place] CV LAB;  Service: Cardiovascular;  Laterality: N/A;  . SINUS EXPLORATION  1992  . TRANSURETHRAL RESECTION OF BLADDER TUMOR WITH MITOMYCIN-C N/A 06/12/2016   Procedure: TRANSURETHRAL RESECTION OF BLADDER TUMOR WITH MITOMYCIN-C;  Surgeon: Hollice Espy, MD;  Location: [facility] ORS;  Service: Urology;  Laterality: N/A;  . TURBINATE REDUCTION Bilateral 06/13/2018   Procedure: Orrin Brigham;  Surgeon: Margaretha Sheffield, MD;  Location: [place];  Service: ENT;  Laterality: Bilateral;  Diabetic - insulin  . UMBILICAL HERNIA REPAIR N/A 06/12/2016   Procedure: HERNIA REPAIR UMBILICAL ADULT;  Surgeon: Christene Lye, MD;  Location: [facility] ORS;  Service: General;  Laterality: N/A;  . URETEROSCOPY Right 06/12/2016   Procedure: URETEROSCOPY;  Surgeon: Hollice Espy, MD;  Location: [facility] ORS;  Service: Urology;  Laterality: Right;    Allergies Actos [pioglitazone], Glimepiride, Glipizide, Metformin, Amoxicillin, Erythromycin base, Esomeprazole magnesium, Lipitor [atorvastatin], Liraglutide, Penicillins, and Sitagliptin  Social History Social History   Tobacco Use  . Smoking status: Former Smoker    Years: 30.00    Types: Cigarettes    Quit date: 05/29/1990    Years since quitting: 28.9  . Smokeless tobacco: Former Systems developer    Types: Chew  Substance Use Topics  . Alcohol use: Yes    Comment: rarely  . Drug use: No    Review of Systems Constitutional: Negative for fever. Cardiovascular: Positive for chest pain Respiratory: Negative for shortness of breath. Gastrointestinal: Negative for abdominal pain, vomiting and diarrhea. Musculoskeletal: Positive for back pain Skin: Negative for rash. Neurological: Negative for headaches, focal weakness or numbness.  All systems negative/normal/unremarkable except as stated in the HPI  ____________________________________________   PHYSICAL EXAM:  VITAL SIGNS: ED Triage Vitals  Enc Vitals Group      BP 04/21/19 0822 123/65     Pulse Rate 04/21/19 0822 76     Resp 04/21/19 0822 20     Temp 04/21/19 0822 98.7 F (37.1 C)     Temp Source 04/21/19 0822 Oral     SpO2 04/21/19 0822 95 %     Weight 04/21/19 0823 215 lb (97.5 kg)     Height 04/21/19 0823 5\' 10"  (1.778 m)     Head Circumference --      Peak Flow --      Pain Score 04/21/19 0823 10     Pain Loc --      Pain Edu? --      Excl. in [place]? --    Constitutional: Alert and oriented. Well appearing and in no distress. Eyes: Conjunctivae are normal. Normal extraocular movements. Cardiovascular: Normal rate, regular rhythm. No murmurs, rubs, or gallops. Respiratory: Normal respiratory effort without tachypnea nor retractions. Breath sounds are clear and equal bilaterally. No wheezes/rales/rhonchi. Gastrointestinal: Soft and nontender. Normal bowel sounds Musculoskeletal: Nontender with normal range of motion in extremities. No lower extremity tenderness nor edema. Neurologic:  Normal speech and language. No gross focal neurologic deficits are appreciated.  Skin:  Skin is warm, dry and intact. No rash noted. Psychiatric: Mood and affect are normal. Speech and behavior are normal.  ____________________________________________  EKG: Interpreted by me.  Sinus rhythm with rate of 80 bpm, normal PR interval, normal QRS, normal  QT  ____________________________________________  ED COURSE:  As part of my medical decision making, I reviewed the following data within the [place] History obtained from family if available, nursing notes, old chart and ekg, as well as notes from prior ED visits. Patient presented for chest pain, we will assess with labs and imaging as indicated at this time.   Procedures  Dustin Blackburn was evaluated in Emergency Department on 04/21/2019 for the symptoms described in the history of present illness. He was evaluated in the context of the global COVID-19 pandemic, which necessitated  consideration that the patient might be at risk for infection with the SARS-CoV-2 virus that causes COVID-19. Institutional protocols and algorithms that pertain to the evaluation of patients at risk for COVID-19 are in a state of rapid change based on information released by regulatory bodies including the CDC and federal and state organizations. These policies and algorithms were followed during the patient's care in the ED.  ____________________________________________   LABS (pertinent positives/negatives)  Labs Reviewed  BASIC METABOLIC PANEL - Abnormal; Notable for the following components:      Result Value   Glucose, Bld 183 (*)    BUN 25 (*)    All other components within normal limits  CBC - Abnormal; Notable for the following components:   RBC 4.04 (*)    Hemoglobin 12.3 (*)    HCT 36.8 (*)    All other components within normal limits  TROPONIN I (HIGH SENSITIVITY)  TROPONIN I (HIGH SENSITIVITY)    RADIOLOGY Images were viewed by me  Chest x-ray is unremarkable CTA IMPRESSION: 1. No thoracic aortic aneurysm or dissection. No intramural hematoma evident.  2.  No demonstrable pulmonary embolus.  3. Scarring and atelectatic change left lower lobe. Note old rib trauma in this area. No edema or consolidation evident.  4.  Several mildly prominent lymph nodes of uncertain etiology.  5. Incomplete visualization of mesenteric sclerosis slightly to the left of midline in the mid abdomen. 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 Note that this finding was present on previous CT abdomen and pelvis.  6.  Multiple foci of coronary artery calcification. ____________________________________________   DIFFERENTIAL DIAGNOSIS   Musculoskeletal pain, GERD, spasm, MI, PE, dissection  FINAL ASSESSMENT AND PLAN  Chest pain   Plan: The patient had presented for chest and back pain. Patient's labs were unremarkable including repeat troponin testing. Patient's imaging  did not reveal any acute process.  Pain is likely musculoskeletal in origin.  He is cleared for outpatient follow-up.   Laurence Aly, MD    Note: This note was generated in part or whole with voice recognition software. Voice recognition is usually quite accurate but there are transcription errors that can and very often do occur. I apologize for any typographical errors that were not detected and corrected.     Earleen Newport, MD 04/21/19 [phone]

## 2019-04-21 NOTE — ED Notes (Signed)
Pt returned from CT at this time.  

## 2019-05-08 ENCOUNTER — Other Ambulatory Visit: Payer: Self-pay | Admitting: Orthopedic Surgery

## 2019-05-08 DIAGNOSIS — M546 Pain in thoracic spine: Secondary | ICD-10-CM

## 2019-05-18 ENCOUNTER — Ambulatory Visit
Admission: RE | Admit: 2019-05-18 | Discharge: 2019-05-18 | Disposition: A | Discharge status: Home / Self Care | Payer: Medicare HMO | Source: Ambulatory Visit | Attending: Orthopedic Surgery | Admitting: Orthopedic Surgery

## 2019-05-18 DIAGNOSIS — M546 Pain in thoracic spine: Secondary | ICD-10-CM

## 2019-06-04 NOTE — Progress Notes (Signed)
@Patient  ID: Dustin Blackburn, male    DOB: [birth year], 79 y.o.   MRN: [id number]  Chief Complaint  Patient presents with  . Follow-up    former DR pt- pt states breathing is doing well. pt reports of occ sob with exertion.     Referring provider: Ricardo Jericho*  HPI:  79 year old male former smoker followed in our office for COPD  PMH: Type 2 diabetes, acid reflux, hyperlipidemia, psoriasis, status post balloon dilatation of bilateral eustachian tubes, endoscopic nasopharyngoscopy in January/2020 Smoker/ Smoking History: Former smoker.  Quit 1992 Maintenance:  Advair 250? Pt of: Dr. Ashby Dawes  06/05/2019  - Visit   79 year old male former smoker followed in our office for COPD.  He is a former patient of DR.  Patient reports today that overall he has been doing well.  This is a simple follow-up for him.  I do not see formal pulmonary function testing in the patient's chart.  Patient has stopped using his inhalers.  He reports that he is doing so well.  At last office visit in January/2020 he was encouraged to remain on Advair 1 puff twice daily.  Could increase to 2 puffs twice daily if he felt a cough or bronchitis-like episode was coming on.  Patient reports that he has been doing well he is getting "ozone IV treatments" by medical doctor in [facility] who practices holistic medicine.  Patient is not as active in the winter months.  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  Chart review shows that he has had a pulmonary function test at [facility] clinic in December/2017.  I cannot review these results.  We will request these records today.  Questionaires / Pulmonary Flowsheets:   MMRC: mMRC Dyspnea Scale mMRC Score  06/05/2019 1    Tests:   04/21/2019-CTA chest-scarring and atelectatic change in the periphery of left lower lobe, no thoracic aortic aneurysm or dissection, no PE, several mildly prominent lymph nodes of uncertain  etiology   0000000 and left heart cath  Prox Cx lesion, 60 %stenosed.  2nd Mrg lesion, 50 %stenosed.  Ost LAD to Prox LAD lesion, 30 %stenosed.  Mid RCA to Dist RCA lesion, 0 %stenosed.  Prox RCA lesion, 20 %stenosed.  Mid RCA lesion, 30 %stenosed.   1. Insignificant coronary artery disease with patent stent distal RCA 2. Mildly reduced left ventricular function with posterior wall akinesis 3. Near normal right sided heart pressures with mean pulmonary arterial pressure 21 mmHg not diagnostic for pulmonary hypertension  07/24/2017-CBC with differential-eosinophils 4.4, eosinophils absolute 310  12/01/2016-CT chest with contrast with 3D MIPS protocol-reticulation and groundglass opacity along the ab normal course of the left eighth rib likely postsurgical or posttraumatic in etiology, associated with left lateral lung herniation, severe three-vessel coronary artery calcifications, mild bronchial wall thickening consistent with small airways disease  06/01/2016-echocardiogram-LV ejection fraction 55%, mild LVH, normal right ventricular systolic function  FENO:  No results found for: NITRICOXIDE  PFT: No flowsheet data found.  WALK:  No flowsheet data found.  Imaging: MR THORACIC SPINE WO CONTRAST  Result Date: 05/18/2019 CLINICAL DATA:  Right-sided mid back pain for 1-2 months. History of bladder cancer. EXAM: MRI THORACIC SPINE WITHOUT CONTRAST TECHNIQUE: Multiplanar, multisequence MR imaging of the thoracic spine was performed. No intravenous contrast was administered. COMPARISON:  None. FINDINGS: Alignment:  Normal Vertebrae: Hemangioma at C7 and T10. No acute compression fracture, discitis-osteomyelitis, facet edema or other focal marrow lesion. No epidural collection.  Cord:  Normal Paraspinal and other soft tissues: Negative Disc levels: There is multilevel anterior osteophytosis. At T9-10, there is a small right subarticular disc protrusion without spinal canal or neural  foraminal stenosis. At T11-12, there is a left foraminal disc protrusion with annular fissure but no associated stenosis. Otherwise, no disc herniation, spinal canal stenosis or neural impingement. IMPRESSION: Mild thoracic degenerative disc disease without spinal canal or neural foraminal stenosis. Electronically Signed   By: Ulyses Jarred M.D.   On: 05/18/2019 19:15    Lab Results:  CBC    Component Value Date/Time   WBC 8.3 04/21/2019 0830   RBC 4.04 (L) 04/21/2019 0830   HGB 12.3 (L) 04/21/2019 0830   HCT 36.8 (L) 04/21/2019 0830   PLT 190 04/21/2019 0830   MCV 91.1 04/21/2019 0830   MCH 30.4 04/21/2019 0830   MCHC 33.4 04/21/2019 0830   RDW 12.4 04/21/2019 0830    BMET    Component Value Date/Time   NA 138 04/21/2019 0830   K 4.2 04/21/2019 0830   CL 102 04/21/2019 0830   CO2 25 04/21/2019 0830   GLUCOSE 183 (H) 04/21/2019 0830   BUN 25 (H) 04/21/2019 0830   CREATININE 0.88 04/21/2019 0830   CALCIUM 8.9 04/21/2019 0830   GFRNONAA >60 04/21/2019 0830   GFRAA >60 04/21/2019 0830    BNP No results found for: BNP  ProBNP No results found for: PROBNP  Specialty Problems      Pulmonary Problems   Airway hyperreactivity   Sinusitis, chronic   CAFL (chronic airflow limitation) (HCC)   Allergic rhinitis      Allergies  Allergen Reactions  . Actos [Pioglitazone] Palpitations    Pt reported a MI   . Glimepiride Rash  . Glipizide Shortness Of Breath  . Metformin Shortness Of Breath    Pt states he "can't breathe and it gives him sinus congestion".   . Amoxicillin Nausea Only  . Erythromycin Base Nausea Only  . Esomeprazole Magnesium Other (See Comments)    Makes patients heart burn worse  . Lipitor [Atorvastatin]     indigestion  . Liraglutide Other (See Comments)    bloating  . Penicillins Nausea Only    Has patient had a PCN reaction causing immediate rash, facial/tongue/throat swelling, SOB or lightheadedness with hypotension: no Has patient had a PCN  reaction causing severe rash involving mucus membranes or skin necrosis: no Has patient had a PCN reaction that required hospitalization:no Has patient had a PCN reaction occurring within the last 10 years: no If all of the above answers are "NO", then may proceed with Cephalosporin use.   . Sitagliptin Other (See Comments)    Pt states it "gives him joint pain" & redness     Immunization History  Administered Date(s) Administered  . Influenza Split 03/14/2010, 05/05/2013  . Influenza, High Dose Seasonal PF 05/05/2013, 05/15/2017, 05/05/2018  . Influenza, Seasonal, Injecte, Preservative Fre 08/01/2008, 02/22/2009, 08/31/2010, 02/27/2011, 05/03/2012  . Influenza,inj,Quad PF,6+ Mos 05/05/2013, 04/14/2014  . Influenza-Unspecified 05/05/2013, 03/17/2014, 05/18/2015  . Pneumococcal Conjugate-13 05/29/2012, 01/09/2014  . Pneumococcal Polysaccharide-23 08/01/2008, 10/06/2015  . Tdap 05/29/2013, 07/20/2014  . Zoster 03/02/2011, 04/28/2013    Past Medical History:  Diagnosis Date  . Arthritis   . Asthma   . Cancer ([place]) 2018   bladder  . Cold   . COPD (chronic obstructive pulmonary disease) ([place])   . Coronary artery disease   . DDD (degenerative disc disease), cervical   . Diabetes mellitus without complication (  HCC)   . Dyspnea   . GERD (gastroesophageal reflux disease)   . Hearing loss associated with syndrome of both ears   . Heart attack ([place]) 2016  . History of aspiration pneumonitis   . History of kidney stones   . Hyperlipidemia   . Kidney cysts   . Kidney stones   . Osteoarthritis of back   . Psoriasis   . Psoriasis   . Seasonal allergies     Tobacco History: Social History   Tobacco Use  Smoking Status Former Smoker  . Years: 30.00  . Types: Cigarettes  . Quit date: 05/29/1990  . Years since quitting: 29.0  Smokeless Tobacco Former Systems developer  . Types: Chew   Counseling given: Not Answered   Continue to not smoke  Outpatient Encounter Medications as of  06/05/2019  Medication Sig  . albuterol (PROVENTIL HFA;VENTOLIN HFA) 108 (90 Base) MCG/ACT inhaler Inhale into the lungs every 6 (six) hours as needed for wheezing or shortness of breath.  Marland Kitchen aspirin EC 81 MG tablet Take 81 mg by mouth daily.  . fluocinonide (LIDEX) 0.05 % external solution Apply 1 application topically 2 (two) times daily as needed (psoriasis).   . hydrocortisone cream 1 % Apply to affected area 2 times daily  . insulin NPH Human (HUMULIN N,NOVOLIN N) 100 UNIT/ML injection Inject 10 Units into the skin 2 (two) times daily.   Marland Kitchen oxyCODONE-acetaminophen (PERCOCET) 5-325 MG tablet Take 1 tablet by mouth every 8 (eight) hours as needed.  Marland Kitchen RELION PEN NEEDLES 31G X 6 MM MISC   . tiZANidine (ZANAFLEX) 2 MG tablet TAKE 1 TABLET BY MOUTH NIGHTLY AS NEEDED  . triamcinolone cream (KENALOG) 0.1 % Apply 1 application topically 2 (two) times daily as needed (psoriasis).   . [DISCONTINUED] budesonide-formoterol (SYMBICORT) 160-4.5 MCG/ACT inhaler INHALE 2 PUFFS BY MOUTH INTO LUNGS TWICE DAILY  . [DISCONTINUED] cephALEXin (KEFLEX) 500 MG capsule Take 1 capsule (500 mg total) by mouth 2 (two) times daily.  . [DISCONTINUED] ciprofloxacin (CIPRO) 250 MG tablet Take 1 tablet (250 mg total) by mouth 2 (two) times daily.  . [DISCONTINUED] meloxicam (MOBIC) 15 MG tablet Take 1 tablet by mouth once daily  . [DISCONTINUED] testosterone (ANDROGEL) 50 MG/5GM (1%) GEL Place 5 g onto the skin daily.    No facility-administered encounter medications on file as of 06/05/2019.     Review of Systems  Review of Systems  Constitutional: Negative for activity change, chills, fatigue, fever and unexpected weight change.  HENT: Negative for postnasal drip, rhinorrhea, sinus pressure, sinus pain and sore throat.   Eyes: Negative.   Respiratory: Negative for cough, shortness of breath and wheezing.   Cardiovascular: Negative for chest pain and palpitations.  Gastrointestinal: Negative for constipation, diarrhea,  nausea and vomiting.  Endocrine: Negative.   Genitourinary: Negative.   Musculoskeletal: Positive for arthralgias.  Skin: Negative.   Neurological: Negative for dizziness and headaches.  Psychiatric/Behavioral: Negative.  Negative for dysphoric mood. The patient is not nervous/anxious.   All other systems reviewed and are negative.    Physical Exam  BP 136/78 (BP Location: Left Arm, Cuff Size: Normal)   Pulse 75   Temp (!) 97.2 F (36.2 C) (Temporal)   Ht 5\' 10"  (1.778 m)   Wt 225 lb 3.2 oz (102.2 kg)   SpO2 95%   BMI 32.31 kg/m   Wt Readings from Last 5 Encounters:  06/05/19 225 lb 3.2 oz (102.2 kg)  04/21/19 215 lb (97.5 kg)  01/10/19 220 lb (99.8  kg)  07/12/18 210 lb (95.3 kg)  06/13/18 219 lb 3.2 oz (99.4 kg)    BMI Readings from Last 5 Encounters:  06/05/19 32.31 kg/m  04/21/19 30.85 kg/m  01/10/19 31.57 kg/m  07/12/18 30.13 kg/m  06/13/18 32.37 kg/m     Physical Exam Vitals and nursing note reviewed.  Constitutional:      General: He is not in acute distress.    Appearance: Normal appearance. He is obese.  HENT:     Head: Normocephalic and atraumatic.     Right Ear: Hearing and external ear normal.     Left Ear: Hearing and external ear normal.     Nose: No mucosal edema.     Right Turbinates: Not enlarged.     Left Turbinates: Not enlarged.  Eyes:     Pupils: Pupils are equal, round, and reactive to light.  Cardiovascular:     Rate and Rhythm: Normal rate and regular rhythm.     Pulses: Normal pulses.     Heart sounds: Normal heart sounds. No murmur.  Pulmonary:     Effort: Pulmonary effort is normal.     Breath sounds: Normal breath sounds. No decreased breath sounds, wheezing or rales.  Musculoskeletal:     Cervical back: Normal range of motion.     Right lower leg: No edema.     Left lower leg: No edema.  Lymphadenopathy:     Cervical: No cervical adenopathy.  Skin:    General: Skin is warm and dry.     Capillary Refill: Capillary  refill takes less than 2 seconds.     Findings: No erythema or rash.  Neurological:     General: No focal deficit present.     Mental Status: He is alert and oriented to person, place, and time.     Motor: No weakness.     Coordination: Coordination normal.     Gait: Gait is intact. Gait (Tolerated walk in office today with no complications) normal.  Psychiatric:        Mood and Affect: Mood normal.        Behavior: Behavior normal. Behavior is cooperative.        Thought Content: Thought content normal.        Judgment: Judgment normal.       Assessment & Plan:   Airway hyperreactivity Unsure of the severity of this Eosinophilia on 2019 lab work No IgE in chart There is a component of allergic rhinitis May be atopic component Doing well off of inhalers today MMRC: 1  Plan: Walk test today Okay to continue to monitor patient clinically Patient can resume using inhalers if he starts to be symptomatic We will request the pulmonary function test from 2017 that he performed at the [facility] Follow-up with our office if symptoms worsen 25-month follow-up to establish care with Dr. Mortimer Fries or Dr. Patsey Berthold  Acid reflux Plan: Continue to follow-up with gastroenterology  Allergic rhinitis Plan: Continue to follow-up with ENT  Abnormal findings on diagnostic imaging of lung Abnormal CT in 2018 status post GI surgery in Care Everywhere  Plan: Continue to monitor clinically We will request last pulmonary function test At next office visit could consider repeating CT imaging  Advice given about COVID-19 virus infection Reviewed with patient today process should he start to develop symptoms of COVID-19 Patient was informed to contact our office with any questions or concerns If he does start to become symptomatic or has an exposure or develops a fever  then we need to obtain outpatient testing Patient would be a good candidate for the monoclonal antibody  infusion Emphasized to the patient that he needs to contact our office quickly if he does start to develop any of the symptoms or concerns Patient is aware to contact our office with any questions  Arthritis Patient reporting arthritic pain in his back He reports he is followed up with primary care on this Patient is also completed physical therapy He continues to persist  Plan: Recommended for the patient to follow back up with primary care May be candidate for Voltaren gel Also emphasized need for the patient to be more physically active    Return in about 6 months (around 12/03/2019) for [employer] - Dr. Patsey Berthold, [facility] - Dr. Mortimer Fries.   Lauraine Rinne, NP 06/05/2019   This appointment required 42 minutes of patient care (this includes precharting, chart review, review of results, face-to-face care, etc.).

## 2019-06-05 ENCOUNTER — Ambulatory Visit: Payer: Medicare HMO | Admitting: Pulmonary Disease

## 2019-06-05 ENCOUNTER — Other Ambulatory Visit: Payer: Self-pay

## 2019-06-05 ENCOUNTER — Encounter: Payer: Self-pay | Admitting: Pulmonary Disease

## 2019-06-05 VITALS — BP 136/78 | HR 75 | Temp 97.2°F | Ht 70.0 in | Wt 225.2 lb

## 2019-06-05 DIAGNOSIS — Z7189 Other specified counseling: Secondary | ICD-10-CM

## 2019-06-05 DIAGNOSIS — R918 Other nonspecific abnormal finding of lung field: Secondary | ICD-10-CM

## 2019-06-05 DIAGNOSIS — K219 Gastro-esophageal reflux disease without esophagitis: Secondary | ICD-10-CM

## 2019-06-05 DIAGNOSIS — J309 Allergic rhinitis, unspecified: Secondary | ICD-10-CM

## 2019-06-05 DIAGNOSIS — J45909 Unspecified asthma, uncomplicated: Secondary | ICD-10-CM

## 2019-06-05 DIAGNOSIS — M1990 Unspecified osteoarthritis, unspecified site: Secondary | ICD-10-CM | POA: Insufficient documentation

## 2019-06-05 NOTE — Assessment & Plan Note (Addendum)
Unsure of the severity of this Eosinophilia on 2019 lab work No IgE in chart There is a component of allergic rhinitis May be atopic component Doing well off of inhalers today MMRC: 1  Plan: Walk test today Okay to continue to monitor patient clinically Patient can resume using inhalers if he starts to be symptomatic We will request the pulmonary function test from 2017 that he performed at the [facility] clinic Follow-up with our office if symptoms worsen 70-month follow-up to establish care with Dr. Mortimer Fries or Dr. Patsey Berthold

## 2019-06-05 NOTE — Assessment & Plan Note (Addendum)
Abnormal CT in 2018 status post GI surgery in Care Everywhere  Plan: Continue to monitor clinically We will request last pulmonary function test At next office visit could consider repeating CT imaging

## 2019-06-05 NOTE — Assessment & Plan Note (Signed)
Patient reporting arthritic pain in his back He reports he is followed up with primary care on this Patient is also completed physical therapy He continues to persist  Plan: Recommended for the patient to follow back up with primary care May be candidate for Voltaren gel Also emphasized need for the patient to be more physically active

## 2019-06-05 NOTE — Assessment & Plan Note (Signed)
Plan: Continue to follow-up with gastroenterology

## 2019-06-05 NOTE — Patient Instructions (Addendum)
You were seen today by Lauraine Rinne, NP  for:   1. Uncomplicated asthma, unspecified asthma severity, unspecified whether persistent  Walk test in office today went well, congratulations  Okay to remain off of inhalers  If you start to become more symptomatic such as cough, shortness of breath, wheezing if you like you need to restart your inhalers okay to do so and please contact our office  We will request the pulmonary function test from [facility] clinic today  2. Gastroesophageal reflux disease, unspecified whether esophagitis present  Continue to follow-up with gastroenterology  3. Allergic rhinitis, unspecified seasonality, unspecified trigger  4. Abnormal findings on diagnostic imaging of lung  Last CT in 2018 was abnormal You are doing well clinically today We will continue to monitor you clinically  We may consider repeating these CT after next office visit when you establish with a new pulmonologist  5. Advice given about COVID-19 virus infection  Practice good hand hygiene Wear a mask Practice physical distancing remaining about 6 to 8 feet apart from people when able  If you do start developing symptoms of body aches, chills, fevers, loss of sense of smell or taste, etc. please contact our office  If you feel that you have been exposed to COVID-19 please contact our office  We likely need to move quickly to obtain outpatient Covid testing because there outpatient treatment therapies that you would likely qualify for if in fact you do test positive for COVID-19 in the future   6. Arthritis    Also may be beneficial to discuss with primary care if you would be a candidate for Voltaren gel Follow-up with them regarding your arthritic symptoms    Follow Up:    Return in about 6 months (around 12/03/2019) for [facility] - Dr. Patsey Berthold, [facility] - Dr. Mortimer Fries.   Please do your part to reduce the spread of COVID-19:      Reduce your risk of any  infection  and COVID19 by using the similar precautions used for avoiding the common cold or flu:  Marland Kitchen Wash your hands often with soap and warm water for at least 20 seconds.  If soap and water are not readily available, use an alcohol-based hand sanitizer with at least 60% alcohol.  . If coughing or sneezing, cover your mouth and nose by coughing or sneezing into the elbow areas of your shirt or coat, into a tissue or into your sleeve (not your hands). Langley Gauss A MASK when in public  . Avoid shaking hands with others and consider head nods or verbal greetings only. . Avoid touching your eyes, nose, or mouth with unwashed hands.  . Avoid close contact with people who are sick. . Avoid places or events with large numbers of people in one location, like concerts or sporting events. . If you have some symptoms but not all symptoms, continue to monitor at home and seek medical attention if your symptoms worsen. . If you are having a medical emergency, call 911.   [place] / e-Visit: [email or web address]         [facility]: [facility]: [id number]                   [facility]: [id number]     It is flu season:   >>> Best ways to protect herself from the flu: Receive the yearly flu vaccine, practice good  hand hygiene washing with soap and also using hand sanitizer when available, eat a nutritious meals, get adequate rest, hydrate appropriately   Please contact the office if your symptoms worsen or you have concerns that you are not improving.   Thank you for choosing [facility] for your healthcare, and for allowing [place] to partner with you on your healthcare journey. I am thankful to be able to provide care to you today.   Wyn Quaker FNP-C     Arthritis Arthritis means joint pain. It can also mean joint disease. A joint is a  place where bones come together. There are more than 100 types of arthritis. What are the causes? This condition may be caused by:  Wear and tear of a joint. This is the most common cause.  A lot of acid in the blood, which leads to pain in the joint (gout).  Pain and swelling (inflammation) in a joint.  Infection of a joint.  Injuries in the joint.  A reaction to medicines (allergy). In some cases, the cause may not be known. What are the signs or symptoms? Symptoms of this condition include:  Redness at a joint.  Swelling at a joint.  Stiffness at a joint.  Warmth coming from the joint.  A fever.  A feeling of being sick. How is this treated? This condition may be treated with:  Treating the cause, if it is known.  Rest.  Raising (elevating) the joint.  Putting cold or hot packs on the joint.  Medicines to treat symptoms and reduce pain and swelling.  Shots of medicines (cortisone) into the joint. You may also be told to make changes in your life, such as doing exercises and losing weight. Follow these instructions at home: Medicines  Take over-the-counter and prescription medicines only as told by your doctor.  Do not take aspirin for pain if your doctor says that you may have gout. Activity  Rest your joint if your doctor tells you to.  Avoid activities that make the pain worse.  Exercise your joint regularly as told by your doctor. Try doing exercises like: ? Swimming. ? Water aerobics. ? Biking. ? Walking. Managing pain, stiffness, and swelling      If told, put ice on the affected area. ? Put ice in a plastic bag. ? Place a towel between your skin and the bag. ? Leave the ice on for 20 minutes, 2-3 times per day.  If your joint is swollen, raise (elevate) it above the level of your heart if told by your doctor.  If your joint feels stiff in the morning, try taking a warm shower.  If told, put heat on the affected area. Do this as  often as told by your doctor. Use the heat source that your doctor recommends, such as a moist heat pack or a heating pad. If you have diabetes, do not apply heat without asking your doctor. To apply heat: ? Place a towel between your skin and the heat source. ? Leave the heat on for 20-30 minutes. ? Remove the heat if your skin turns bright red. This is very important if you are unable to feel pain, heat, or cold. You may have a greater risk of getting burned. General instructions  Do not use any products that contain nicotine or tobacco, such as cigarettes, e-cigarettes, and chewing tobacco. If you need help quitting, ask your doctor.  Keep all follow-up visits as told by your doctor. This is important. Contact  a doctor if:  The pain gets worse.  You have a fever. Get help right away if:  You have very bad pain in your joint.  You have swelling in your joint.  Your joint is red.  Many joints become painful and swollen.  You have very bad back pain.  Your leg is very weak.  You cannot control your pee (urine) or poop (stool). Summary  Arthritis means joint pain. It can also mean joint disease. A joint is a place where bones come together.  The most common cause of this condition is wear and tear of a joint.  Symptoms of this condition include redness, swelling, or stiffness of the joint.  This condition is treated with rest, raising the joint, medicines, and putting cold or hot packs on the joint.  Follow your doctor's instructions about medicines, activity, exercises, and other home care treatments. This information is not intended to replace advice given to you by your health care provider. Make sure you discuss any questions you have with your health care provider. Document Revised: 04/22/2018 Document Reviewed: 04/22/2018 Elsevier Patient Education  2020 [employer].

## 2019-06-05 NOTE — Assessment & Plan Note (Signed)
Plan: Continue to follow-up with ENT

## 2019-06-05 NOTE — Assessment & Plan Note (Signed)
Reviewed with patient today process should he start to develop symptoms of COVID-19 Patient was informed to contact our office with any questions or concerns If he does start to become symptomatic or has an exposure or develops a fever then we need to obtain outpatient testing Patient would be a good candidate for the monoclonal antibody infusion Emphasized to the patient that he needs to contact our office quickly if he does start to develop any of the symptoms or concerns Patient is aware to contact our office with any questions

## 2019-06-25 ENCOUNTER — Ambulatory Visit: Admit: 2019-06-25 | Discharge: 2019-06-26 | Payer: MEDICARE | Attending: Family | Primary: Family

## 2019-06-25 DIAGNOSIS — R42 Dizziness and giddiness: Principal | ICD-10-CM

## 2019-06-25 DIAGNOSIS — E119 Type 2 diabetes mellitus without complications: Principal | ICD-10-CM

## 2019-06-25 DIAGNOSIS — I2510 Atherosclerotic heart disease of native coronary artery without angina pectoris: Principal | ICD-10-CM

## 2019-06-25 DIAGNOSIS — G8929 Other chronic pain: Secondary | ICD-10-CM

## 2019-06-25 DIAGNOSIS — Z7289 Other problems related to lifestyle: Principal | ICD-10-CM

## 2019-06-25 DIAGNOSIS — M545 Low back pain: Secondary | ICD-10-CM

## 2019-06-25 DIAGNOSIS — Z0000 Encounter for general adult medical examination without abnormal findings: Principal | ICD-10-CM

## 2019-06-25 DIAGNOSIS — E782 Mixed hyperlipidemia: Principal | ICD-10-CM

## 2019-06-25 DIAGNOSIS — K219 Gastro-esophageal reflux disease without esophagitis: Principal | ICD-10-CM

## 2019-06-25 MED ORDER — DICLOFENAC 1 % TOPICAL GEL
Freq: Four times a day (QID) | TOPICAL | 0 refills | 13.00000 days | Status: CP
Start: 2019-06-25 — End: 2020-06-24

## 2019-06-25 MED ORDER — FAMOTIDINE 20 MG TABLET
ORAL_TABLET | Freq: Two times a day (BID) | ORAL | 1 refills | 30 days | Status: CP | PRN
Start: 2019-06-25 — End: 2020-06-24

## 2019-06-25 MED ORDER — MECLIZINE 25 MG TABLET
ORAL_TABLET | Freq: Three times a day (TID) | ORAL | 1 refills | 30 days | Status: CP | PRN
Start: 2019-06-25 — End: ?

## 2019-06-25 MED ORDER — MELOXICAM 15 MG TABLET
ORAL_TABLET | Freq: Every day | ORAL | 3 refills | 90 days | Status: CP
Start: 2019-06-25 — End: ?

## 2019-06-25 MED ORDER — INSULIN HUMAN U-100 NPH-REGULR 70-30 MIX 100 UNIT/ML SUBCUTANEOUS SUSP
Freq: Two times a day (BID) | SUBCUTANEOUS | 6 refills | 42 days | Status: CP
Start: 2019-06-25 — End: 2020-06-24

## 2019-07-01 DIAGNOSIS — K219 Gastro-esophageal reflux disease without esophagitis: Principal | ICD-10-CM

## 2019-07-03 ENCOUNTER — Emergency Department
Admit: 2019-07-03 | Discharge: 2019-07-03 | Disposition: A | Discharge status: Home / Self Care | Payer: MEDICARE | Attending: Family

## 2019-07-03 ENCOUNTER — Ambulatory Visit
Admit: 2019-07-03 | Discharge: 2019-07-03 | Disposition: A | Discharge status: Home / Self Care | Payer: MEDICARE | Attending: Family

## 2019-07-03 ENCOUNTER — Institutional Professional Consult (permissible substitution): Admit: 2019-07-03 | Discharge: 2019-07-04 | Payer: MEDICARE | Attending: Family Medicine | Primary: Family Medicine

## 2019-07-03 DIAGNOSIS — N3000 Acute cystitis without hematuria: Principal | ICD-10-CM

## 2019-07-03 MED ORDER — CEFUROXIME AXETIL 500 MG TABLET
ORAL_TABLET | Freq: Two times a day (BID) | ORAL | 0 refills | 10.00000 days | Status: CP
Start: 2019-07-03 — End: 2019-07-13

## 2019-07-15 ENCOUNTER — Other Ambulatory Visit: Payer: Self-pay

## 2019-07-15 DIAGNOSIS — Z8551 Personal history of malignant neoplasm of bladder: Secondary | ICD-10-CM

## 2019-07-16 ENCOUNTER — Ambulatory Visit: Admit: 2019-07-16 | Discharge: 2019-07-17 | Payer: MEDICARE | Attending: Family | Primary: Family

## 2019-07-16 DIAGNOSIS — N3090 Cystitis, unspecified without hematuria: Principal | ICD-10-CM

## 2019-07-16 DIAGNOSIS — L409 Psoriasis, unspecified: Principal | ICD-10-CM

## 2019-07-16 MED ORDER — CEFUROXIME AXETIL 500 MG TABLET
ORAL_TABLET | Freq: Two times a day (BID) | ORAL | 0 refills | 10.00000 days | Status: CP
Start: 2019-07-16 — End: 2019-07-26

## 2019-07-16 MED ORDER — FLUOCINONIDE 0.05 % TOPICAL SOLUTION
Freq: Two times a day (BID) | TOPICAL | 1 refills | 0 days | Status: CP
Start: 2019-07-16 — End: ?

## 2019-07-18 ENCOUNTER — Ambulatory Visit: Payer: Medicare HMO | Admitting: Urology

## 2019-07-18 ENCOUNTER — Other Ambulatory Visit
Admission: RE | Admit: 2019-07-18 | Discharge: 2019-07-18 | Disposition: A | Discharge status: Home / Self Care | Payer: Medicare HMO | Attending: Urology | Admitting: Urology

## 2019-07-18 ENCOUNTER — Encounter: Payer: Self-pay | Admitting: Urology

## 2019-07-18 ENCOUNTER — Other Ambulatory Visit: Payer: Medicare HMO | Admitting: Urology

## 2019-07-18 ENCOUNTER — Other Ambulatory Visit: Payer: Self-pay

## 2019-07-18 VITALS — BP 135/82 | HR 103 | Ht 70.0 in | Wt 216.0 lb

## 2019-07-18 DIAGNOSIS — N390 Urinary tract infection, site not specified: Secondary | ICD-10-CM

## 2019-07-18 DIAGNOSIS — Z8551 Personal history of malignant neoplasm of bladder: Secondary | ICD-10-CM | POA: Diagnosis present

## 2019-07-18 DIAGNOSIS — Z87898 Personal history of other specified conditions: Secondary | ICD-10-CM

## 2019-07-18 LAB — URINALYSIS, COMPLETE (UACMP) WITH MICROSCOPIC
Bacteria, UA: NONE SEEN
Bilirubin Urine: NEGATIVE
Glucose, UA: NEGATIVE mg/dL
Ketones, ur: NEGATIVE mg/dL
Nitrite: NEGATIVE
Specific Gravity, Urine: 1.025 (ref 1.005–1.030)
pH: 5 (ref 5.0–8.0)

## 2019-07-18 MED ORDER — LIDOCAINE HCL URETHRAL/MUCOSAL 2 % EX GEL
1.0000 "application " | Freq: Once | CUTANEOUS | Status: AC
  Administered 2019-07-18: 1 via URETHRAL

## 2019-07-18 MED ORDER — TAMSULOSIN HCL 0.4 MG PO CAPS: 0.4000 mg | ORAL_CAPSULE | Freq: Every day | ORAL | 0 refills | Status: DC

## 2019-07-18 NOTE — Progress Notes (Signed)
   07/18/19  CC:  Chief Complaint  Patient presents with  . Cysto    HPI: 79 year old male with a history of low-grade noninvasive bladder cancer returns today for routine surveillance cystoscopy.  He was initially found to have incidental bladder wall thickening on CT abdomen pelvis on 03/2016. Cystoscopy showed a papillary proximal and 1 cm tumor at the dome of the bladder. He was taken to the operating room on 06/12/2016 for TURBT, bilateral retrograde, and instillation of mitomycin.  Surgical pathology was consistent with low-grade, noninvasive tumor.  More recently, he returned to the operating room on 10/02/2017 for cystoscopy, bladder biopsy, bilateral retrograde pyelograms secondary to bladder erythema.    Surgical pathology was benign with evidence of squamous metaplasia and reactive changes, scarlike fibrosis, but no evidence of malignancy or dysplasia.  2/20, NED.  Since his last visit, he had an episode of urinary retention as well as several urinary tract infections.  His last one was at the beginning of February.  He reports that he saw his primary care just 2 days ago and was restarted on cefuroxime although his urine analysis at this time was negative other than for blood on dip.  He is not currently on any BPH medications.  Vitals:   07/18/19 1303  BP: 135/82  Pulse: (!) 103   NED. A&Ox3.   No respiratory distress   Abd soft, NT, ND Normal phallus with bilateral descended testicles  Cystoscopy Procedure Note  Patient identification was confirmed, informed consent was obtained, and patient was prepped using Betadine solution.  Lidocaine jelly was administered per urethral meatus.    Preoperative abx where received prior to procedure.     Pre-Procedure: - Inspection reveals a normal caliber ureteral meatus.  Procedure: The flexible cystoscope was introduced without difficulty - No urethral strictures/lesions are present. - Enlarged prostate bilobar  coaptation - Minimally elevated bladder neck - Bilateral ureteral orifices identified - Bladder mucosa  reveals erythematous patch of the dome, approximately 1 cm which has a bullous type appearance without any obvious papillary change. - No bladder stones - Mild trabeculation  Retroflexion shows no significant median lobe   Post-Procedure: - Patient tolerated the procedure well  Assessment/ Plan:  1. History of bladder cancer Patchy erythema at the dome, not likely related to recurrence however it is relatively focal  I recommend a repeat cystoscopy in 6 weeks allowing more time for after his most recent UTI.  If the lesion persists, would recommend repeat biopsy.  He is agreeable this plan.  - lidocaine (XYLOCAINE) 2 % jelly 1 application  2. Recurrent UTI Suspect this is likely related to bladder outlet obstruction, possibly incomplete bladder emptying especially in the light of history of urinary retention  He is not currently on any BPH medications and asked him to resume Flomax, he has been on this in the past.  He may benefit from consideration of outlet procedure.  We will also plan for TRUS prostate sizing at the time of evaluation to help counsel him better about various outlet techniques. -flomax  3. History of urinary retention As above   Return in about 6 weeks (around 08/29/2019) for cysto/TRUS.  Hollice Espy, MD

## 2019-07-22 ENCOUNTER — Telehealth: Payer: Self-pay | Admitting: Urology

## 2019-07-22 NOTE — Telephone Encounter (Signed)
Pt wife states that the tamsulosin is making his stomach hurt and feels like it's on fire. Pt wants to know what can be done about this. Please advise. Thanks. [phone]

## 2019-07-22 NOTE — Telephone Encounter (Signed)
Take with food.   Hollice Espy, MD

## 2019-07-22 NOTE — Telephone Encounter (Signed)
Spoke with wife, patient has tried taking with food yesterday. Patient has not taken any Flomax today, he has been experiencing stomach pain,  and burning. Advised to call PCP and seek medical attention if symptoms persist.

## 2019-08-15 ENCOUNTER — Ambulatory Visit: Admit: 2019-08-15 | Discharge: 2019-08-16 | Payer: MEDICARE | Attending: "Endocrinology | Primary: "Endocrinology

## 2019-08-15 DIAGNOSIS — Z7289 Other problems related to lifestyle: Principal | ICD-10-CM

## 2019-08-15 DIAGNOSIS — E782 Mixed hyperlipidemia: Principal | ICD-10-CM

## 2019-08-15 DIAGNOSIS — E119 Type 2 diabetes mellitus without complications: Principal | ICD-10-CM

## 2019-08-16 ENCOUNTER — Other Ambulatory Visit: Payer: Self-pay | Admitting: Urology

## 2019-08-27 ENCOUNTER — Ambulatory Visit: Admit: 2019-08-27 | Discharge: 2019-08-28 | Payer: MEDICARE

## 2019-08-27 ENCOUNTER — Ambulatory Visit: Admit: 2019-08-27 | Discharge: 2019-08-28 | Payer: MEDICARE | Attending: Family | Primary: Family

## 2019-08-29 ENCOUNTER — Encounter: Admit: 2019-08-29 | Discharge: 2019-08-29 | Payer: MEDICARE | Attending: Anesthesiology | Primary: Anesthesiology

## 2019-08-29 ENCOUNTER — Ambulatory Visit: Admit: 2019-08-29 | Discharge: 2019-08-29 | Payer: MEDICARE

## 2019-09-01 NOTE — Progress Notes (Signed)
09/02/19  Chief Complaint  Patient presents with  . Cysto/TRUS     HPI: 79 year old male with a personal tree of low-grade noninvasive bladder cancer who presents today for cystoscopic surveillance as well as prostate sizing for refractory urinary symptoms and recent urinary retention.  He was initially found to have incidental bladder wall thickening on CT abdomen pelvis on 03/2016. Cystoscopy showed a papillary proximal and 1 cm tumor at the dome of the bladder. He was taken to the operating room on 06/12/2016 for TURBT, bilateral retrograde, and instillation ofmitomycin.  Surgical pathology was consistent with low-grade, noninvasive tumor.  More recently, he returned to the operating room on 10/02/2017 for cystoscopy, bladder biopsy, bilateral retrograde pyelograms secondary to bladder erythema.  Surgical pathology was benign with evidence of squamous metaplasia and reactive changes, scarlike fibrosis, but no evidence of malignancy or dysplasia.  Last cystoscopy 06/2019 showed an erythematous lesion at the dome of the bladder which is being followed.  He reports of right flank pain onset 1 week ago with no associated symptoms of nausea, vomiting, chills or fevers. He reports of pain being similar to previous stone episodes. He attributes his pain to chronic history of low back pain.   Denies burning with urination and gross hematuria.    Please see previous notes for details.      NED. A&Ox3.   No respiratory distress   Abd soft, NT, ND Normal phallus with bilateral descended testicles  Periprocedural Cipro was given today due to his history of UTI.    Cystoscopy Procedure Note  Patient identification was confirmed, informed consent was obtained, and patient was prepped using Betadine solution.  Lidocaine jelly was administered per urethral meatus.    Preoperative abx where received prior to procedure.     Pre-Procedure: - Inspection reveals a normal caliber ureteral  meatus.  Procedure: The flexible cystoscope was introduced without difficulty - No urethral strictures/lesions are present. - Enlarged prostate bilobar coaptation  - Minimally elevated bladder neck - Bilateral ureteral orifices identified - Bladder mucosa revealsreveals erythematous patch of the dome, approximately 1 cm which has a bullous type appearance without any obvious papillary change, stable from cysto 2/21 - No bladder stones - No trabeculation  Retroflexion shows no significant median lobe.    Post-Procedure: - Patient tolerated the procedure well   Prostate transrectal ultrasound sizing   Informed consent was obtained after discussing risks/benefits of the procedure.  A time out was performed to ensure correct patient identity.   Pre-Procedure: -Transrectal probe was placed without difficulty -Transrectal Ultrasound performed revealing a 22 gm prostate measuring 2.8 x 4.2 x 3.5 cm (length) -No significant hypoechoic or median lobe noted     Assessment/ Plan:  1. Right flank pain   KUB to rule out stones and other musculoskeletal problems   2. BPH with outlet obstruction  Currently on Flomax  Given low prostate volume of 22 gm, good candidate for Urolift vs TURP. Discussed procedure along with risks and benefits.  He is most interested in [place]. He understands all risks including bleeding, infection, damage surrounding structures, need for further procedures, possible need for Foley catheter amongst others. All questions answered. He was given literature on the procedure outline the above as well.  3. History of bladder cancer  Bladder erythema at the dome, stable from last cystoscopy without regression or progression  Given his history of bladder cancer, I recommend proceeding with biopsy of this lesion especially in light of him undergoing an outlet procedure as  above. All questions answered. Preop urine culture today.   Jamas Lav, am acting as a  scribe for Dr. Hollice Espy,  I have reviewed the above documentation for accuracy and completeness, and I agree with the above.   Hollice Espy, MD

## 2019-09-02 ENCOUNTER — Ambulatory Visit
Admission: RE | Admit: 2019-09-02 | Discharge: 2019-09-02 | Disposition: A | Discharge status: Home / Self Care | Payer: Medicare HMO | Source: Ambulatory Visit | Attending: Urology | Admitting: Urology

## 2019-09-02 ENCOUNTER — Other Ambulatory Visit: Payer: Self-pay

## 2019-09-02 ENCOUNTER — Other Ambulatory Visit: Payer: Self-pay | Admitting: Radiology

## 2019-09-02 ENCOUNTER — Ambulatory Visit (INDEPENDENT_AMBULATORY_CARE_PROVIDER_SITE_OTHER): Payer: Medicare HMO | Admitting: Urology

## 2019-09-02 DIAGNOSIS — Z87898 Personal history of other specified conditions: Secondary | ICD-10-CM

## 2019-09-02 DIAGNOSIS — Z8551 Personal history of malignant neoplasm of bladder: Secondary | ICD-10-CM

## 2019-09-02 DIAGNOSIS — N3289 Other specified disorders of bladder: Secondary | ICD-10-CM

## 2019-09-02 DIAGNOSIS — R109 Unspecified abdominal pain: Secondary | ICD-10-CM

## 2019-09-02 DIAGNOSIS — N401 Enlarged prostate with lower urinary tract symptoms: Secondary | ICD-10-CM

## 2019-09-02 DIAGNOSIS — R300 Dysuria: Secondary | ICD-10-CM

## 2019-09-02 DIAGNOSIS — N390 Urinary tract infection, site not specified: Secondary | ICD-10-CM

## 2019-09-02 MED ORDER — CIPROFLOXACIN HCL 500 MG PO TABS
500.0000 mg | ORAL_TABLET | Freq: Once | ORAL | Status: AC
  Administered 2019-09-02: 500 mg via ORAL

## 2019-09-02 MED ORDER — TESTOSTERONE 20.25 MG/1.25 GRAM (1.62 %) TRANSDERMAL GEL PUMP
Freq: Every day | TRANSDERMAL | 3 refills | 0 days | Status: CP
Start: 2019-09-02 — End: ?

## 2019-09-03 ENCOUNTER — Telehealth: Payer: Self-pay | Admitting: *Deleted

## 2019-09-03 LAB — URINALYSIS, COMPLETE
Bilirubin, UA: NEGATIVE
Glucose, UA: NEGATIVE
Ketones, UA: NEGATIVE
Nitrite, UA: NEGATIVE
Specific Gravity, UA: 1.025 (ref 1.005–1.030)
Urobilinogen, Ur: 0.2 mg/dL (ref 0.2–1.0)
pH, UA: 5 (ref 5.0–7.5)

## 2019-09-03 LAB — MICROSCOPIC EXAMINATION: RBC, Urine: >30 /hpf — AB (ref 0–2)

## 2019-09-03 NOTE — Telephone Encounter (Addendum)
Patient informed, pain has not worsened. Will notify us if pain worsens. Voiced understanding.    ----- Message from Hollice Espy, MD sent at 09/03/2019  9:37 AM EDT ----- No obvious stone seen on this x-ray.  That being said, we know that you have stones from your previous CT scan.  Sometimes changes can be seen on x-ray alone.  If you continue to have right back pain or gets worse, let us know we can order a noncontrast CT scan.  Hollice Espy, MD

## 2019-09-06 LAB — CULTURE, URINE COMPREHENSIVE

## 2019-09-08 ENCOUNTER — Telehealth: Payer: Self-pay

## 2019-09-08 NOTE — Telephone Encounter (Signed)
Patient called today stating that he would like to cancel his surgery. He currently is having a lot going on with his arthritis pain and would like to get this under control before he thinks about surgery. It was explained to the patient that the surgery is for a bladder biopsy along with a Urolift. Dr. Cherrie Gauze note was reviewed with the patient explaining that there is a possibility of bladder cancer recurrence but we will not know unless we can biopsy if it needs to be treated. Patient verbalized understanding and states he still wishes to cancel. He also would like pre-op and clearance cancelled

## 2019-09-09 NOTE — Telephone Encounter (Signed)
Case has been cancelled per patient request

## 2019-09-12 ENCOUNTER — Ambulatory Visit: Payer: Medicare HMO | Admitting: Pulmonary Disease

## 2019-09-16 ENCOUNTER — Telehealth: Payer: Self-pay

## 2019-09-16 ENCOUNTER — Ambulatory Visit: Admit: 2019-09-16 | Discharge: 2019-09-17 | Payer: MEDICARE | Attending: Family | Primary: Family

## 2019-09-16 DIAGNOSIS — J410 Simple chronic bronchitis: Principal | ICD-10-CM

## 2019-09-16 DIAGNOSIS — Z87442 Personal history of urinary calculi: Principal | ICD-10-CM

## 2019-09-16 DIAGNOSIS — G8929 Other chronic pain: Principal | ICD-10-CM

## 2019-09-16 DIAGNOSIS — M545 Low back pain: Principal | ICD-10-CM

## 2019-09-16 DIAGNOSIS — Z9889 Other specified postprocedural states: Principal | ICD-10-CM

## 2019-09-16 DIAGNOSIS — R1011 Right upper quadrant pain: Secondary | ICD-10-CM

## 2019-09-16 MED ORDER — OXYCODONE-ACETAMINOPHEN 10 MG-325 MG TABLET: 1 | tablet | Freq: Four times a day (QID) | 0 refills | 8 days | Status: AC

## 2019-09-16 MED ORDER — BUDESONIDE-FORMOTEROL HFA 160 MCG-4.5 MCG/ACTUATION AEROSOL INHALER
Freq: Two times a day (BID) | RESPIRATORY_TRACT | 3 refills | 9 days | Status: CP
Start: 2019-09-16 — End: ?

## 2019-09-16 MED ORDER — ALBUTEROL SULFATE 2.5 MG/3 ML (0.083 %) SOLUTION FOR NEBULIZATION
Freq: Four times a day (QID) | RESPIRATORY_TRACT | 1 refills | 1 days | Status: CP | PRN
Start: 2019-09-16 — End: 2020-09-16

## 2019-09-16 MED ORDER — ANDRODERM 2 MG/24 HOUR TRANSDERMAL 24 HOUR PATCH
MEDICATED_PATCH | Freq: Every day | TOPICAL | 3 refills | 30 days | Status: CP
Start: 2019-09-16 — End: ?

## 2019-09-16 MED ORDER — OXYCODONE-ACETAMINOPHEN 10 MG-325 MG TABLET
ORAL_TABLET | Freq: Four times a day (QID) | ORAL | 0 refills | 8.00000 days | Status: CP | PRN
Start: 2019-09-16 — End: 2019-09-16

## 2019-09-16 NOTE — Telephone Encounter (Signed)
Patient called stating that he is still having right flank and back pain. He would like to go ahead with CTscan for further evaluation

## 2019-09-17 MED ORDER — ANDRODERM 2 MG/24 HOUR TRANSDERMAL 24 HOUR PATCH
MEDICATED_PATCH | 3 refills | 0 days | Status: CP
Start: 2019-09-17 — End: ?

## 2019-09-19 ENCOUNTER — Other Ambulatory Visit: Payer: Medicare HMO

## 2019-09-22 ENCOUNTER — Other Ambulatory Visit: Payer: Medicare HMO

## 2019-09-23 ENCOUNTER — Other Ambulatory Visit: Payer: Self-pay | Admitting: Radiology

## 2019-09-23 DIAGNOSIS — N401 Enlarged prostate with lower urinary tract symptoms: Secondary | ICD-10-CM

## 2019-09-23 DIAGNOSIS — N3289 Other specified disorders of bladder: Secondary | ICD-10-CM

## 2019-09-23 DIAGNOSIS — Z8551 Personal history of malignant neoplasm of bladder: Secondary | ICD-10-CM

## 2019-09-25 ENCOUNTER — Other Ambulatory Visit: Payer: Medicare HMO

## 2019-09-29 ENCOUNTER — Ambulatory Visit
Admission: RE | Admit: 2019-09-29 | Discharge: 2019-09-29 | Disposition: A | Discharge status: Home / Self Care | Payer: Medicare HMO | Source: Ambulatory Visit | Attending: Urology | Admitting: Urology

## 2019-09-29 ENCOUNTER — Ambulatory Visit: Admit: 2019-09-29 | Payer: Medicare HMO | Admitting: Urology

## 2019-09-29 ENCOUNTER — Other Ambulatory Visit: Payer: Self-pay

## 2019-09-29 DIAGNOSIS — R1011 Right upper quadrant pain: Secondary | ICD-10-CM

## 2019-09-29 SURGERY — CYSTOSCOPY, WITH BIOPSY
Anesthesia: General

## 2019-09-29 NOTE — Telephone Encounter (Signed)
Patient had his CT scan today   Sharyn Lull

## 2019-10-01 ENCOUNTER — Telehealth: Payer: Self-pay | Admitting: Urology

## 2019-10-01 MED ORDER — TAMSULOSIN HCL 0.4 MG PO CAPS: 0.4000 mg | ORAL_CAPSULE | Freq: Every day | ORAL | 0 refills | Status: DC

## 2019-10-01 NOTE — Telephone Encounter (Signed)
Ms. Lahm called on behalf of the pt, asking if he should continue to take Flomax, if so he needs a refill as the pt is completely out.  Please send Rx to [facility] in [place]. Please advise, thanks.

## 2019-10-01 NOTE — Telephone Encounter (Signed)
Left VM refill sent in as requested, per Dr. Cherrie Gauze last note-continue Flomax

## 2019-10-06 ENCOUNTER — Telehealth: Payer: Self-pay | Admitting: *Deleted

## 2019-10-06 NOTE — Telephone Encounter (Addendum)
Spoke to patient's wife, Voiced understanding, appointment scheduled.   ----- Message from Hollice Espy, MD sent at 10/06/2019  3:11 PM EDT ----- Please schedule virtual visit with me to discuss CT scan results.   Hollice Espy, MD

## 2019-10-07 NOTE — Progress Notes (Signed)
Virtual Visit via Telephone Note  I connected with Dustin Blackburn on 10/08/19 at  4:00 PM EDT by telephone and verified that I am speaking with the correct person using two identifiers.  Location: Patient: home Provider: office    I discussed the limitations, risks, security and privacy concerns of performing an evaluation and management service by telephone and the availability of in person appointments. I also discussed with the patient that there may be a patient responsible charge related to this service. The patient expressed understanding and agreed to proceed.   History of Present Illness: Dustin Blackburn is a 79 y.o. M with a personal tree of low-grade noninvasive bladder cancer who presents today for f/u.   He was initially found to have incidental bladder wall thickening on CT abdomen pelvis on 03/2016. Cystoscopy showed a papillary proximal and 1 cm tumor at the dome of the bladder. He was taken to the operating room on 06/12/2016 for TURBT, bilateral retrograde, and instillation ofmitomycin.  Surgical pathology was consistent with low-grade, noninvasive tumor.  More recently, he returned to the operating room on 10/02/2017 for cystoscopy, bladder biopsy, bilateral retrograde pyelograms secondary to bladder erythema. Surgical pathology was benign with evidence of squamous metaplasia and reactive changes, scarlike fibrosis, but no evidence of malignancy or dysplasia.  His most recent CT renal stone study from 09/29/19 revealed bilateral nephrolithiasis, with 9 mm calculus in right renal pelvis.  He reports of right lower back pain which worsens w/ movement a few weeks ago. He is currently doing fine.   Observations/Objective: Pt is engaged and asking good questions.   Pertinent Imagings:  CLINICAL DATA:  Right flank pain for 1 week. Nephrolithiasis. Personal history of bladder carcinoma. Urinary retention.  EXAM: CT ABDOMEN AND PELVIS WITHOUT  CONTRAST  TECHNIQUE: Multidetector CT imaging of the abdomen and pelvis was performed following the standard protocol without IV contrast.  COMPARISON:  08/27/2018 from [facility]  FINDINGS: Lower chest: No acute findings. Stable left basilar pleural-parenchymal scarring.  Hepatobiliary: No mass visualized on this unenhanced exam. Gallbladder is unremarkable. No evidence of biliary ductal dilatation.  Pancreas: No mass or inflammatory process visualized on this unenhanced exam.  Spleen:  Within normal limits in size.  Adrenals/Urinary tract: Multiple fluid attenuation renal cysts are again seen bilaterally. Bilateral nephrolithiasis is again seen, with largest calculus in the lower pole of the right kidney measuring 11 mm. A 9 mm calculus is noted in the right renal pelvis, however there is no evidence of ureteral calculi or hydronephrosis. Unremarkable unopacified urinary bladder.  Stomach/Bowel: No evidence of obstruction, inflammatory process, or abnormal fluid collections. Normal appendix visualized.  Vascular/Lymphatic: No pathologically enlarged lymph nodes identified. No evidence of abdominal aortic aneurysm. Aortic atherosclerosis incidentally noted.  Reproductive:  No mass or other significant abnormality.  Other:  None.  Musculoskeletal: No suspicious bone lesions identified. 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  IMPRESSION: Bilateral nephrolithiasis, with 9 mm calculus in right renal pelvis. No evidence of ureteral calculi, hydronephrosis, or other acute findings.   Electronically Signed   By: Marlaine Hind M.D.   On: 09/29/2019 09:25  I have personally reviewed the images and agree with radiologist interpretation.   Assessment and Plan:  1. Intermittent Right flank pain   CT renal stone study from 09/29/19 revealed bilateral nephrolithiasis, with 9  mm calculus in right renal pelvis. We discussed various treatment options including ESWL prior to Urolift procedure  vs. ureteroscopy, laser lithotripsy, and stent during time of Urolift. We discussed the risks and benefits of both including bleeding, infection, damage to surrounding structures, efficacy with need for possible further intervention, and need for temporary ureteral stent. Warning symptoms reviewed including risks and benefits  Pt has elected ureteroscopy, laser lithotripsy and stent at same time of Urolift/bladder bx  Likely leave stent w/o string given he is having an outlet procedure   2. BPH with outlet obstruction  Scheduled for upcoming Urolift/bladder bx on 11/10/2019   3. History of bladder cancer  Bladder erythema at the dome, stable from last cystoscopy without regression or progression  Given his history of bladder cancer, I recommend proceeding with biopsy of this lesion especially in light of him undergoing an outlet procedure as above.    I discussed the assessment and treatment plan with the patient. The patient was provided an opportunity to ask questions and all were answered. The patient agreed with the plan and demonstrated an understanding of the instructions.   The patient was advised to call back or seek an in-person evaluation if the symptoms worsen or if the condition fails to improve as anticipated.  I provided 20 minutes of non-face-to-face time during this encounter.   Dustin Blackburn, am acting as a scribe for Dr. Hollice Espy,  I have reviewed the above documentation for accuracy and completeness, and I agree with the above.   Hollice Espy, MD

## 2019-10-08 ENCOUNTER — Other Ambulatory Visit: Payer: Self-pay

## 2019-10-08 ENCOUNTER — Telehealth (INDEPENDENT_AMBULATORY_CARE_PROVIDER_SITE_OTHER): Payer: Medicare HMO | Admitting: Urology

## 2019-10-08 DIAGNOSIS — N138 Other obstructive and reflux uropathy: Secondary | ICD-10-CM

## 2019-10-08 DIAGNOSIS — N401 Enlarged prostate with lower urinary tract symptoms: Secondary | ICD-10-CM

## 2019-10-08 DIAGNOSIS — Z8551 Personal history of malignant neoplasm of bladder: Secondary | ICD-10-CM

## 2019-10-17 ENCOUNTER — Telehealth: Payer: Self-pay | Admitting: Pulmonary Disease

## 2019-10-17 NOTE — Telephone Encounter (Signed)
10/17/2019  Received surgical clearance from [facility] for the patient attached.  Patient is scheduled to go undergo bladder biopsy as well as UroLift insertion.  This is due to bladder erythema, BPH.  Patient will receive general anesthesia.  Peri-operative Assessment of Pulmonary Risk for Non-Thoracic Surgery:  ForMr. [place], moderate risk of perioperative pulmonary complications is increased by:  Age greater than 64 years  Asthma  Former Smoker   Respiratory complications generally occur in 1% of ASA Class I patients, 5% of ASA Class II and 10% of ASA Class III-IV patients These complications rarely result in mortality and iclude postoperative pneumonia, atelectasis, pulmonary embolism, ARDS and increased time requiring postoperative mechanical ventilation.  Overall, I recommend proceeding with the surgery if the risk for respiratory complications are outweighed by the potential benefits. This will need to be discussed between the patient and surgeon.  To reduce risks of respiratory complications, I recommend: --Pre- and post-operative incentive spirometry performed frequently while awake --Avoiding use of pancuronium during anesthesia.  I have discussed the risk factors and recommendations above with the patient.    Would also recommend that if the patient starts to have acute worsening changes with his breathing he needs to present to our office for follow-up or be seen in our [place] office location.  Wyn Quaker, FNP

## 2019-10-17 NOTE — Telephone Encounter (Signed)
Surgical clearance has been faxed back. Nothing further needed at this time.

## 2019-10-20 ENCOUNTER — Ambulatory Visit: Admit: 2019-10-20 | Discharge: 2019-10-21 | Payer: MEDICARE | Attending: "Endocrinology | Primary: "Endocrinology

## 2019-10-20 DIAGNOSIS — N500 Atrophy of testis: Principal | ICD-10-CM

## 2019-10-20 DIAGNOSIS — E119 Type 2 diabetes mellitus without complications: Principal | ICD-10-CM

## 2019-10-20 DIAGNOSIS — Z125 Encounter for screening for malignant neoplasm of prostate: Principal | ICD-10-CM

## 2019-10-30 ENCOUNTER — Other Ambulatory Visit: Payer: Self-pay

## 2019-10-30 ENCOUNTER — Encounter
Admission: RE | Admit: 2019-10-30 | Discharge: 2019-10-30 | Disposition: A | Discharge status: Home / Self Care | Payer: Medicare HMO | Source: Ambulatory Visit | Attending: Urology | Admitting: Urology

## 2019-10-30 HISTORY — DX: Unspecified cataract: H26.9

## 2019-10-30 HISTORY — DX: Depression, unspecified: F32.A

## 2019-10-30 NOTE — Patient Instructions (Addendum)
INSTRUCTIONS FOR SURGERY     Your surgery is scheduled for:   Monday, June 14TH     To find out your arrival time for the day of surgery,          please call [phone] between 1 pm and 3 pm on :  Friday, June 11TH     When you arrive for surgery, report to the [place].       Do NOT stop on the first floor to register.    REMEMBER: Instructions that are not followed completely may result in serious medical risk,  up to and including death, or upon the discretion of your surgeon and anesthesiologist,            your surgery may need to be rescheduled.  __X__ 1. Do not eat food after midnight the night before your procedure.                    No gum, candy, lozenger, tic tacs, tums or hard candies.                  ABSOLUTELY NOTHING SOLID IN YOUR MOUTH AFTER MIDNIGHT                    You may drink unlimited clear liquids up to 2 hours before you are scheduled to arrive for surgery.                   Do not drink anything within those 2 hours unless you need to take medicine, then take the                   smallest amount you need.  Clear liquids include:  water, apple juice without pulp,                   any flavor Gatorade, Black coffee, black tea.  Sugar may be added but no dairy/ honey /lemon.                        Broth and jello is not considered a clear liquid.  __x__  2. On the morning of surgery, please brush your teeth with toothpaste and water. You may rinse with                  mouthwash if you wish but DO NOT SWALLOW TOOTHPASTE OR MOUTHWASH  __X___3. NO alcohol for 24 hours before or after surgery.  __x___ 4.  Do NOT smoke or use e-cigarettes for 24 HOURS PRIOR TO SURGERY.                      DO NOT Use any chewable tobacco products for at least 6 hours prior to surgery.  __x___ 5. If you start any new medication after this appointment and prior to surgery, please  Bring it with you on the day of surgery.  ___x__ 6. Notify your doctor if there is any change in your medical condition, such as fever,  infection, vomitting, diarrhea or any open sores.  __x___ 7.  USE the ANTIBACTERIAL SOAP as instructed, the night before surgery and the day of surgery.                   Once you have washed with this soap, do NOT use any of the following: Powders, perfumes                    or lotions. Please do not wear make up, hairpins, clips or nail polish. You MAY wear deodorant.                   Men may shave their face and neck.  Women need to shave 48 hours prior to surgery.                   DO NOT wear ANY jewelry on the day of surgery. If there are rings that are too tight to                    remove easily, please address this prior to the surgery day. Piercings need to be removed.                                                                     NO METAL ON YOUR BODY.                    Do NOT bring any valuables.  If you came to Pre-Admit testing then you will not need license,                     insurance card or credit card.  If you will be staying overnight, please either leave your things in                     the car or have your family be responsible for these items.                     [facility] IS NOT RESPONSIBLE FOR BELONGINGS OR VALUABLES.  ___X__ 8. DO NOT wear contact lenses on surgery day.  You may not have dentures,                     Hearing aides, contacts or glasses in the operating room. These items can be                    Placed in the Recovery Room to receive immediately after surgery.  __x___ 9. IF YOU ARE SCHEDULED TO GO HOME ON THE SAME DAY, YOU MUST                   Have someone to drive you home and to stay with you  for the first 24 hours.                    Have an arrangement prior to arriving on surgery day.  ___x__ 10. Take the following medications on the morning of surgery with a sip of water:  1. FLOMAX                     2. HYDROCORTISONE/CORTEF                      3. SYMBICORT INHALER                     4. NASACORT SPRAY, if needed                     5.                     6.  _____ 11.  Follow any instructions provided to you by your surgeon.                        Such as enema, clear liquid bowel prep  __X__  12. STOP ALL ASPIRIN PRODUCTS ONE WEEK BEFORE SURGERY.                        THIS INCLUDES BC POWDERS / GOODIES POWDER  __x___ 13. STOP Anti-inflammatories as of: ONE WEEK BEFORE SURGERY                      This includes IBUPROFEN / MOTRIN / ADVIL / ALEVE/ NAPROXYN                    YOU MAY TAKE TYLENOL ANY TIME PRIOR TO SURGERY.  _____ 14.  Stop supplements until after surgery.                     This includes: PROJOINT (the over the counter pain reliever)                  ___x___16.   TAKE 1/2 OF USUAL INSULIN DOSE ON THE EVENING PRIOR TO SURGERY.                     Do NOT take any diabetes medications on surgery day.  ___x___18.  Wear clean and comfortable clothing to the hospital.  PLEASE BRING PHONE [place].

## 2019-10-30 NOTE — Pre-Procedure Instructions (Signed)
ECG 12 Lead2/08/2019 [facility] Component Name Value Ref Range  EKG Systolic BP  mmHg  EKG Diastolic BP  mmHg  EKG Ventricular Rate 79 BPM  EKG Atrial Rate 79 BPM  EKG P-R Interval 186 ms  EKG QRS Duration 100 ms  EKG Q-T Interval 354 ms  EKG QTC Calculation 405 ms  EKG Calculated P Axis 60 degrees  EKG Calculated R Axis -8 degrees  EKG Calculated T Axis 52 degrees  QTC Fredericia 388 ms  Result Narrative  NORMAL SINUS RHYTHM INFERIOR INFARCT (CITED ON OR BEFORE 30-Dec-2014) ABNORMAL ECG WHEN COMPARED WITH ECG OF 08-May-2016 08:22, NO SIGNIFICANT CHANGE WAS FOUND Confirmed by Shara Blazing ([id number]) on 07/03/2019 9:01:38 PM  Other Result Information  Interface, Rad Results In - 07/03/2019  9:01 PM EST NORMAL SINUS RHYTHM INFERIOR INFARCT (CITED ON OR BEFORE 30-Dec-2014) ABNORMAL ECG WHEN COMPARED WITH ECG OF 08-May-2016 08:22, NO SIGNIFICANT CHANGE WAS FOUND Confirmed by Shara Blazing ([id number]) on 07/03/2019 9:01:38 PM

## 2019-10-31 ENCOUNTER — Other Ambulatory Visit: Payer: Medicare HMO

## 2019-10-31 ENCOUNTER — Other Ambulatory Visit: Payer: Self-pay | Admitting: Family Medicine

## 2019-10-31 ENCOUNTER — Other Ambulatory Visit: Payer: Self-pay

## 2019-10-31 DIAGNOSIS — N401 Enlarged prostate with lower urinary tract symptoms: Secondary | ICD-10-CM

## 2019-10-31 LAB — URINALYSIS, COMPLETE
Bilirubin, UA: NEGATIVE
Glucose, UA: NEGATIVE
Ketones, UA: NEGATIVE
Nitrite, UA: NEGATIVE
Protein,UA: NEGATIVE
Specific Gravity, UA: 1.02 (ref 1.005–1.030)
Urobilinogen, Ur: 0.2 mg/dL (ref 0.2–1.0)
pH, UA: 5 (ref 5.0–7.5)

## 2019-10-31 LAB — MICROSCOPIC EXAMINATION: Bacteria, UA: NONE SEEN

## 2019-10-31 MED ORDER — TAMSULOSIN HCL 0.4 MG PO CAPS: 0.4000 mg | ORAL_CAPSULE | Freq: Every day | ORAL | 3 refills | Status: DC

## 2019-11-06 ENCOUNTER — Other Ambulatory Visit: Payer: Self-pay

## 2019-11-06 ENCOUNTER — Other Ambulatory Visit
Admission: RE | Admit: 2019-11-06 | Discharge: 2019-11-06 | Disposition: A | Discharge status: Home / Self Care | Payer: Medicare HMO | Source: Ambulatory Visit | Attending: Urology | Admitting: Urology

## 2019-11-06 DIAGNOSIS — Z20822 Contact with and (suspected) exposure to covid-19: Secondary | ICD-10-CM | POA: Diagnosis not present

## 2019-11-06 DIAGNOSIS — Z01812 Encounter for preprocedural laboratory examination: Secondary | ICD-10-CM | POA: Insufficient documentation

## 2019-11-06 LAB — CBC
HCT: 38.3 % — ABNORMAL LOW (ref 39.0–52.0)
Hemoglobin: 12.7 g/dL — ABNORMAL LOW (ref 13.0–17.0)
MCH: 31.3 pg (ref 26.0–34.0)
MCHC: 33.2 g/dL (ref 30.0–36.0)
MCV: 94.3 fL (ref 80.0–100.0)
Platelets: 207 10*3/uL (ref 150–400)
RBC: 4.06 MIL/uL — ABNORMAL LOW (ref 4.22–5.81)
RDW: 12.7 % (ref 11.5–15.5)
WBC: 5.8 10*3/uL (ref 4.0–10.5)
nRBC: 0 % (ref 0.0–0.2)

## 2019-11-06 LAB — CULTURE, URINE COMPREHENSIVE

## 2019-11-06 LAB — BASIC METABOLIC PANEL
Anion gap: 7 (ref 5–15)
BUN: 17 mg/dL (ref 8–23)
CO2: 27 mmol/L (ref 22–32)
Calcium: 8.7 mg/dL — ABNORMAL LOW (ref 8.9–10.3)
Chloride: 106 mmol/L (ref 98–111)
Creatinine, Ser: 1.21 mg/dL (ref 0.61–1.24)
GFR calc Af Amer: >60 mL/min (ref >60)
GFR calc non Af Amer: 58 mL/min — ABNORMAL LOW (ref >60)
Glucose, Bld: 127 mg/dL — ABNORMAL HIGH (ref 70–99)
Potassium: 4.2 mmol/L (ref 3.5–5.1)
Sodium: 140 mmol/L (ref 135–145)

## 2019-11-07 ENCOUNTER — Telehealth: Payer: Self-pay

## 2019-11-07 DIAGNOSIS — B952 Enterococcus as the cause of diseases classified elsewhere: Secondary | ICD-10-CM

## 2019-11-07 DIAGNOSIS — N390 Urinary tract infection, site not specified: Secondary | ICD-10-CM

## 2019-11-07 LAB — SARS CORONAVIRUS 2 (TAT 6-24 HRS): SARS Coronavirus 2: NEGATIVE

## 2019-11-07 MED ORDER — CIPROFLOXACIN HCL 500 MG PO TABS: 500.0000 mg | ORAL_TABLET | Freq: Two times a day (BID) | ORAL | 0 refills | Status: AC

## 2019-11-07 NOTE — Telephone Encounter (Signed)
-----   Message from Hollice Espy, MD sent at 11/07/2019 12:43 PM EDT ----- Please treat with cipro 500 mg bid x 7 days starting today!!!!   Surgery Monday.  Hollice Espy, MD

## 2019-11-07 NOTE — Telephone Encounter (Signed)
Called pt informed him of the information below. Pt gave verbal understanding. RX sent.  

## 2019-11-09 MED ORDER — CIPROFLOXACIN IN D5W 400 MG/200ML IV SOLN
400.0000 mg | INTRAVENOUS | Status: AC
  Administered 2019-11-10: 400 mg via INTRAVENOUS
  Filled 2019-11-09: qty 200

## 2019-11-10 ENCOUNTER — Other Ambulatory Visit: Payer: Self-pay

## 2019-11-10 ENCOUNTER — Encounter: Payer: Self-pay | Admitting: Urology

## 2019-11-10 ENCOUNTER — Encounter
Admission: RE | Disposition: A | Discharge status: Home / Self Care | Payer: Self-pay | Source: Home / Self Care | Attending: Urology

## 2019-11-10 ENCOUNTER — Ambulatory Visit
Admission: RE | Admit: 2019-11-10 | Discharge: 2019-11-10 | Disposition: A | Discharge status: Home / Self Care | Payer: Medicare HMO | Attending: Urology | Admitting: Urology

## 2019-11-10 ENCOUNTER — Ambulatory Visit: Payer: Medicare HMO | Admitting: Anesthesiology

## 2019-11-10 ENCOUNTER — Ambulatory Visit: Payer: Medicare HMO

## 2019-11-10 DIAGNOSIS — Z79899 Other long term (current) drug therapy: Secondary | ICD-10-CM | POA: Insufficient documentation

## 2019-11-10 DIAGNOSIS — E119 Type 2 diabetes mellitus without complications: Secondary | ICD-10-CM | POA: Insufficient documentation

## 2019-11-10 DIAGNOSIS — Z7951 Long term (current) use of inhaled steroids: Secondary | ICD-10-CM | POA: Insufficient documentation

## 2019-11-10 DIAGNOSIS — N3289 Other specified disorders of bladder: Secondary | ICD-10-CM | POA: Diagnosis not present

## 2019-11-10 DIAGNOSIS — Z79890 Hormone replacement therapy (postmenopausal): Secondary | ICD-10-CM | POA: Insufficient documentation

## 2019-11-10 DIAGNOSIS — N138 Other obstructive and reflux uropathy: Secondary | ICD-10-CM | POA: Insufficient documentation

## 2019-11-10 DIAGNOSIS — Z955 Presence of coronary angioplasty implant and graft: Secondary | ICD-10-CM | POA: Diagnosis not present

## 2019-11-10 DIAGNOSIS — Z8551 Personal history of malignant neoplasm of bladder: Secondary | ICD-10-CM | POA: Insufficient documentation

## 2019-11-10 DIAGNOSIS — J449 Chronic obstructive pulmonary disease, unspecified: Secondary | ICD-10-CM | POA: Diagnosis not present

## 2019-11-10 DIAGNOSIS — Z87442 Personal history of urinary calculi: Secondary | ICD-10-CM | POA: Insufficient documentation

## 2019-11-10 DIAGNOSIS — Z888 Allergy status to other drugs, medicaments and biological substances status: Secondary | ICD-10-CM | POA: Insufficient documentation

## 2019-11-10 DIAGNOSIS — Z794 Long term (current) use of insulin: Secondary | ICD-10-CM | POA: Diagnosis not present

## 2019-11-10 DIAGNOSIS — I252 Old myocardial infarction: Secondary | ICD-10-CM | POA: Insufficient documentation

## 2019-11-10 DIAGNOSIS — Z880 Allergy status to penicillin: Secondary | ICD-10-CM | POA: Insufficient documentation

## 2019-11-10 DIAGNOSIS — N401 Enlarged prostate with lower urinary tract symptoms: Secondary | ICD-10-CM | POA: Diagnosis not present

## 2019-11-10 DIAGNOSIS — I2510 Atherosclerotic heart disease of native coronary artery without angina pectoris: Secondary | ICD-10-CM | POA: Insufficient documentation

## 2019-11-10 DIAGNOSIS — Z881 Allergy status to other antibiotic agents status: Secondary | ICD-10-CM | POA: Insufficient documentation

## 2019-11-10 DIAGNOSIS — N200 Calculus of kidney: Secondary | ICD-10-CM | POA: Insufficient documentation

## 2019-11-10 HISTORY — PX: CYSTOSCOPY WITH STENT PLACEMENT: SHX5790

## 2019-11-10 HISTORY — PX: CYSTOSCOPY WITH BIOPSY: SHX5122

## 2019-11-10 HISTORY — PX: CYSTOSCOPY WITH INSERTION OF UROLIFT: SHX6678

## 2019-11-10 HISTORY — PX: URETEROSCOPY WITH HOLMIUM LASER LITHOTRIPSY: SHX6645

## 2019-11-10 LAB — GLUCOSE, CAPILLARY
Glucose-Capillary: 132 mg/dL — ABNORMAL HIGH (ref 70–99)
Glucose-Capillary: 134 mg/dL — ABNORMAL HIGH (ref 70–99)

## 2019-11-10 SURGERY — CYSTOSCOPY, WITH BIOPSY
Anesthesia: General | Site: Ureter | Laterality: Right

## 2019-11-10 MED ORDER — FAMOTIDINE 20 MG PO TABS
ORAL_TABLET | ORAL | Status: AC
  Administered 2019-11-10: 20 mg via ORAL
  Filled 2019-11-10: qty 1

## 2019-11-10 MED ORDER — EPHEDRINE SULFATE 50 MG/ML IJ SOLN
INTRAMUSCULAR | Status: DC | PRN
  Administered 2019-11-10 (×2): 10 mg via INTRAVENOUS

## 2019-11-10 MED ORDER — ACETAMINOPHEN 10 MG/ML IV SOLN
INTRAVENOUS | Status: DC | PRN
  Administered 2019-11-10: 1000 mg via INTRAVENOUS

## 2019-11-10 MED ORDER — ORAL CARE MOUTH RINSE: 15.0000 mL | Freq: Once | OROMUCOSAL | Status: AC

## 2019-11-10 MED ORDER — SEVOFLURANE IN SOLN
RESPIRATORY_TRACT | Status: AC
  Filled 2019-11-10: qty 250

## 2019-11-10 MED ORDER — CHLORHEXIDINE GLUCONATE 0.12 % MT SOLN
OROMUCOSAL | Status: AC
  Administered 2019-11-10: 15 mL via OROMUCOSAL
  Filled 2019-11-10: qty 15

## 2019-11-10 MED ORDER — PROPOFOL 10 MG/ML IV BOLUS
INTRAVENOUS | Status: DC | PRN
  Administered 2019-11-10: 170 mg via INTRAVENOUS
  Administered 2019-11-10: 30 mg via INTRAVENOUS

## 2019-11-10 MED ORDER — OXYCODONE HCL 5 MG/5ML PO SOLN: 5.0000 mg | Freq: Once | ORAL | Status: DC | PRN

## 2019-11-10 MED ORDER — OXYBUTYNIN CHLORIDE 5 MG PO TABS
5.0000 mg | ORAL_TABLET | Freq: Three times a day (TID) | ORAL | 0 refills | Status: DC | PRN
Start: 2019-11-10 — End: 2019-12-30

## 2019-11-10 MED ORDER — HYDROCODONE-ACETAMINOPHEN 5-325 MG PO TABS: 1.0000 | ORAL_TABLET | Freq: Four times a day (QID) | ORAL | 0 refills | Status: DC | PRN

## 2019-11-10 MED ORDER — DEXAMETHASONE SODIUM PHOSPHATE 10 MG/ML IJ SOLN
INTRAMUSCULAR | Status: DC | PRN
  Administered 2019-11-10: 10 mg via INTRAVENOUS

## 2019-11-10 MED ORDER — FENTANYL CITRATE (PF) 100 MCG/2ML IJ SOLN
INTRAMUSCULAR | Status: DC | PRN
  Administered 2019-11-10 (×2): 25 ug via INTRAVENOUS
  Administered 2019-11-10: 50 ug via INTRAVENOUS

## 2019-11-10 MED ORDER — SODIUM CHLORIDE 0.9 % IV SOLN: INTRAVENOUS | Status: DC

## 2019-11-10 MED ORDER — LIDOCAINE HCL (CARDIAC) PF 100 MG/5ML IV SOSY
PREFILLED_SYRINGE | INTRAVENOUS | Status: DC | PRN
  Administered 2019-11-10: 100 mg via INTRAVENOUS

## 2019-11-10 MED ORDER — CHLORHEXIDINE GLUCONATE 0.12 % MT SOLN: 15.0000 mL | Freq: Once | OROMUCOSAL | Status: AC

## 2019-11-10 MED ORDER — OXYCODONE HCL 5 MG PO TABS: 5.0000 mg | ORAL_TABLET | Freq: Once | ORAL | Status: DC | PRN

## 2019-11-10 MED ORDER — FENTANYL CITRATE (PF) 100 MCG/2ML IJ SOLN: 25.0000 ug | INTRAMUSCULAR | Status: DC | PRN

## 2019-11-10 MED ORDER — FAMOTIDINE 20 MG PO TABS: 20.0000 mg | ORAL_TABLET | Freq: Once | ORAL | Status: AC

## 2019-11-10 MED ORDER — IOHEXOL 180 MG/ML  SOLN
INTRAMUSCULAR | Status: DC | PRN
  Administered 2019-11-10: 20 mL

## 2019-11-10 MED ORDER — PHENYLEPHRINE HCL (PRESSORS) 10 MG/ML IV SOLN
INTRAVENOUS | Status: DC | PRN
  Administered 2019-11-10 (×9): 100 ug via INTRAVENOUS

## 2019-11-10 MED ORDER — EPHEDRINE 5 MG/ML INJ
INTRAVENOUS | Status: AC
  Filled 2019-11-10: qty 10

## 2019-11-10 MED ORDER — ONDANSETRON HCL 4 MG/2ML IJ SOLN: 4.0000 mg | Freq: Once | INTRAMUSCULAR | Status: DC | PRN

## 2019-11-10 MED ORDER — ONDANSETRON HCL 4 MG/2ML IJ SOLN
INTRAMUSCULAR | Status: DC | PRN
  Administered 2019-11-10: 4 mg via INTRAVENOUS

## 2019-11-10 MED ORDER — FENTANYL CITRATE (PF) 100 MCG/2ML IJ SOLN
INTRAMUSCULAR | Status: AC
  Filled 2019-11-10: qty 2

## 2019-11-10 MED ORDER — ACETAMINOPHEN 10 MG/ML IV SOLN
INTRAVENOUS | Status: AC
  Filled 2019-11-10: qty 100

## 2019-11-10 SURGICAL SUPPLY — 37 items
BAG DRAIN CYSTO-URO LG1000N (MISCELLANEOUS) ×4 IMPLANT
BASKET ZERO TIP 1.9FR (BASKET) ×4 IMPLANT
BRUSH SCRUB EZ  4% CHG (MISCELLANEOUS) ×1
BRUSH SCRUB EZ 1% IODOPHOR (MISCELLANEOUS) ×4 IMPLANT
BRUSH SCRUB EZ 4% CHG (MISCELLANEOUS) ×3 IMPLANT
CATH COUDE FOLEY 2W 5CC 18FR (CATHETERS) ×4 IMPLANT
CATH URETL 5X70 OPEN END (CATHETERS) ×4 IMPLANT
CNTNR SPEC 2.5X3XGRAD LEK (MISCELLANEOUS) ×3
CONT SPEC 4OZ STER OR WHT (MISCELLANEOUS) ×1
CONTAINER SPEC 2.5X3XGRAD LEK (MISCELLANEOUS) ×3 IMPLANT
COVER WAND RF STERILE (DRAPES) ×4 IMPLANT
DRAPE UTILITY 15X26 TOWEL STRL (DRAPES) ×4 IMPLANT
DRSG TELFA 4X3 1S NADH ST (GAUZE/BANDAGES/DRESSINGS) ×4 IMPLANT
ELECT REM PT RETURN 9FT ADLT (ELECTROSURGICAL) ×4
ELECTRODE REM PT RTRN 9FT ADLT (ELECTROSURGICAL) ×3 IMPLANT
FIBER LASER TRAC TIP (UROLOGICAL SUPPLIES) ×4 IMPLANT
GLOVE BIO SURGEON STRL SZ 6.5 (GLOVE) ×4 IMPLANT
GOWN STRL REUS W/ TWL LRG LVL3 (GOWN DISPOSABLE) ×6 IMPLANT
GOWN STRL REUS W/TWL LRG LVL3 (GOWN DISPOSABLE) ×2
GUIDEWIRE GREEN .038 145CM (MISCELLANEOUS) ×4 IMPLANT
GUIDEWIRE STR DUAL SENSOR (WIRE) ×4 IMPLANT
INFUSOR MANOMETER BAG 3000ML (MISCELLANEOUS) ×4 IMPLANT
INTRODUCER DILATOR DOUBLE (INTRODUCER) ×4 IMPLANT
KIT TURNOVER CYSTO (KITS) ×4 IMPLANT
NDL SAFETY ECLIPSE 18X1.5 (NEEDLE) ×3 IMPLANT
NEEDLE HYPO 18GX1.5 SHARP (NEEDLE) ×1
PACK CYSTO AR (MISCELLANEOUS) ×4 IMPLANT
SET CYSTO W/LG BORE CLAMP LF (SET/KITS/TRAYS/PACK) ×4 IMPLANT
SHEATH URETERAL 12FRX35CM (MISCELLANEOUS) ×4 IMPLANT
SOL .9 NS 3000ML IRR  AL (IV SOLUTION) ×1
SOL .9 NS 3000ML IRR UROMATIC (IV SOLUTION) ×3 IMPLANT
STENT URET 6FRX24 CONTOUR (STENTS) IMPLANT
STENT URET 6FRX26 CONTOUR (STENTS) ×4 IMPLANT
SURGILUBE 2OZ TUBE FLIPTOP (MISCELLANEOUS) ×4 IMPLANT
SYSTEM UROLIFT (Male Continence) ×20 IMPLANT
WATER STERILE IRR 1000ML POUR (IV SOLUTION) ×4 IMPLANT
WATER STERILE IRR 3000ML UROMA (IV SOLUTION) ×4 IMPLANT

## 2019-11-10 NOTE — H&P (Signed)
11/10/19  RRR CTAB  History of Present Illness: Dustin Blackburn is a 79 y.o. M with a personal tree of low-grade noninvasive bladder cancer who presents today for f/u.   He was initially found to have incidental bladder wall thickening on CT abdomen pelvis on 03/2016. Cystoscopy showed a papillary proximal and 1 cm tumor at the dome of the bladder. He was taken to the operating room on 06/12/2016 for TURBT, bilateral retrograde, and instillation ofmitomycin.  Surgical pathology was consistent with low-grade, noninvasive tumor.  More recently, he returned to the operating room on 10/02/2017 for cystoscopy, bladder biopsy, bilateral retrograde pyelograms secondary to bladder erythema. Surgical pathology was benign with evidence of squamous metaplasia and reactive changes, scarlike fibrosis, but no evidence of malignancy or dysplasia.  His most recent CT renal stone study from 09/29/19 revealed bilateral nephrolithiasis, with 9 mm calculus in right renal pelvis.  He reports of right lower back pain which worsens w/ movement a few weeks ago. He is currently doing fine.   Observations/Objective: Pt is engaged and asking good questions.   Past Medical History:  Diagnosis Date  . Arthritis   . Asthma   . Cancer ([place]) 2018   bladder  . Cataracts, bilateral   . Cold   . COPD (chronic obstructive pulmonary disease) ([place])   . Coronary artery disease   . DDD (degenerative disc disease), cervical   . Depression   . Diabetes mellitus without complication ([place])   . Dyspnea   . GERD (gastroesophageal reflux disease)   . Hearing loss associated with syndrome of both ears   . Heart attack ([place]) 2016   stented x 1  . History of aspiration pneumonitis   . History of kidney stones   . Hyperlipidemia   . Kidney cysts   . Kidney stones   . Osteoarthritis of back   . Psoriasis   . Psoriasis   . Seasonal allergies    Current Meds  Medication Sig  . ANDRODERM 2 MG/24HR PT24 Apply 1  patch topically daily.  . ciprofloxacin (CIPRO) 500 MG tablet Take 1 tablet (500 mg total) by mouth 2 (two) times daily for 7 days.  . clobetasol cream (TEMOVATE) 0.99 % Apply 1 application topically 2 (two) times daily as needed (psoriasis).   . hydrocortisone (CORTEF) 10 MG tablet Take 10 mg by mouth daily. Was ordered by dr. Sheppard Evens ([place])  . insulin NPH Human (HUMULIN N,NOVOLIN N) 100 UNIT/ML injection Inject 12 Units into the skin 2 (two) times daily.   Marland Kitchen RELION PEN NEEDLES 31G X 6 MM MISC   . SYMBICORT 160-4.5 MCG/ACT inhaler Inhale 2 puffs into the lungs 2 (two) times daily as needed for shortness of breath.  . tamsulosin (FLOMAX) 0.4 MG CAPS capsule Take 1 capsule (0.4 mg total) by mouth daily.  . [DISCONTINUED] tamsulosin (FLOMAX) 0.4 MG CAPS capsule Take 1 capsule (0.4 mg total) by mouth daily.   Allergies  Allergen Reactions  . Actos [Pioglitazone] Palpitations    Pt reported a MI   . Glimepiride Rash    Shortness of breath also  . Glipizide Shortness Of Breath  . Metformin Shortness Of Breath    Pt states he "can't breathe and it gives him sinus congestion".   . Amoxicillin Nausea Only  . Erythromycin Base Nausea Only  . Esomeprazole Magnesium Other (See Comments)    Makes patients heart burn worse  . Lipitor [Atorvastatin]     indigestion  . Liraglutide Other (See Comments)  bloating  . Penicillins Nausea Only    Has patient had a PCN reaction causing immediate rash, facial/tongue/throat swelling, SOB or lightheadedness with hypotension: no Has patient had a PCN reaction causing severe rash involving mucus membranes or skin necrosis: no Has patient had a PCN reaction that required hospitalization:no Has patient had a PCN reaction occurring within the last 10 years: no If all of the above answers are "NO", then may proceed with Cephalosporin use.   . Sitagliptin Other (See Comments)    Pt states it "gives him joint pain" & redness    Past Surgical History:   Procedure Laterality Date  . CARDIAC CATHETERIZATION  2016 and 2018  . COLONOSCOPY    . COLONOSCOPY WITH PROPOFOL N/A 09/18/2017   Procedure: COLONOSCOPY WITH PROPOFOL;  Surgeon: Lollie Sails, MD;  Location: [facility] ENDOSCOPY;  Service: Endoscopy;  Laterality: N/A;  . CORONARY ANGIOPLASTY  2016  . CORONARY STENT PLACEMENT  2016  . CYSTOSCOPY W/ RETROGRADES Bilateral 06/12/2016   Procedure: CYSTOSCOPY WITH RETROGRADE PYELOGRAM;  Surgeon: Hollice Espy, MD;  Location: [facility] ORS;  Service: Urology;  Laterality: Bilateral;  . CYSTOSCOPY W/ RETROGRADES Bilateral 10/02/2017   Procedure: CYSTOSCOPY WITH RETROGRADE PYELOGRAM;  Surgeon: Hollice Espy, MD;  Location: [facility] ORS;  Service: Urology;  Laterality: Bilateral;  . CYSTOSCOPY WITH BIOPSY N/A 10/02/2017   Procedure: CYSTOSCOPY WITH Bladder BIOPSY;  Surgeon: Hollice Espy, MD;  Location: [facility] ORS;  Service: Urology;  Laterality: N/A;  . CYSTOSCOPY WITH URETEROSCOPY Right 10/02/2017   Procedure: DIAGNOSTIC RIGHT URETEROSCOPY;  Surgeon: Hollice Espy, MD;  Location: [facility] ORS;  Service: Urology;  Laterality: Right;  . ESOPHAGOGASTRODUODENOSCOPY (EGD) WITH PROPOFOL N/A 09/18/2017   Procedure: ESOPHAGOGASTRODUODENOSCOPY (EGD) WITH PROPOFOL;  Surgeon: Lollie Sails, MD;  Location: [facility] ENDOSCOPY;  Service: Endoscopy;  Laterality: N/A;  . fatty tumors removed    . HIATAL HERNIA REPAIR  1992  . LIPOMA EXCISION     right leg  . NASOPHARYNGOSCOPY EUSTATION TUBE BALLOON DILATION Bilateral 06/13/2018   Procedure: NASOPHARYNGOSCOPY EUSTATION TUBE BALLOON DILATION DIABETIC, NEED EUSTATION TUBE BALLOON SYSTEM;  Surgeon: Margaretha Sheffield, MD;  Location: [place];  Service: ENT;  Laterality: Bilateral;  . RIGHT/LEFT HEART CATH AND CORONARY ANGIOGRAPHY N/A 12/21/2016   Procedure: Right/Left Heart Cath and Coronary Angiography;  Surgeon: Isaias Cowman, MD;  Location: [place] CV LAB;  Service: Cardiovascular;  Laterality: N/A;  . SINUS  EXPLORATION  1992  . TRANSURETHRAL RESECTION OF BLADDER TUMOR WITH MITOMYCIN-C N/A 06/12/2016   Procedure: TRANSURETHRAL RESECTION OF BLADDER TUMOR WITH MITOMYCIN-C;  Surgeon: Hollice Espy, MD;  Location: [facility] ORS;  Service: Urology;  Laterality: N/A;  . TURBINATE REDUCTION Bilateral 06/13/2018   Procedure: Orrin Brigham;  Surgeon: Margaretha Sheffield, MD;  Location: [place];  Service: ENT;  Laterality: Bilateral;  Diabetic - insulin  . UMBILICAL HERNIA REPAIR N/A 06/12/2016   Procedure: HERNIA REPAIR UMBILICAL ADULT;  Surgeon: Christene Lye, MD;  Location: [facility] ORS;  Service: General;  Laterality: N/A;  . URETEROSCOPY Right 06/12/2016   Procedure: URETEROSCOPY;  Surgeon: Hollice Espy, MD;  Location: [facility] ORS;  Service: Urology;  Laterality: Right;     Pertinent Imagings:  CLINICAL DATA: Right flank pain for 1 week. Nephrolithiasis. Personal history of bladder carcinoma. Urinary retention.  EXAM: CT ABDOMEN AND PELVIS WITHOUT CONTRAST  TECHNIQUE: Multidetector CT imaging of the abdomen and pelvis was performed following the standard protocol without IV contrast.  COMPARISON: 08/27/2018 from [facility]  FINDINGS: Lower chest: No acute  findings. Stable left basilar pleural-parenchymal scarring.  Hepatobiliary: No mass visualized on this unenhanced exam. Gallbladder is unremarkable. No evidence of biliary ductal dilatation.  Pancreas: No mass or inflammatory process visualized on this unenhanced exam.  Spleen: Within normal limits in size.  Adrenals/Urinary tract: Multiple fluid attenuation renal cysts are again seen bilaterally. Bilateral nephrolithiasis is again seen, with largest calculus in the lower pole of the right kidney measuring 11 mm. A 9 mm calculus is noted in the right renal pelvis, however there is no evidence of ureteral calculi or hydronephrosis. Unremarkable unopacified urinary  bladder.  Stomach/Bowel: No evidence of obstruction, inflammatory process, or abnormal fluid collections. Normal appendix visualized.  Vascular/Lymphatic: No pathologically enlarged lymph nodes identified. No evidence of abdominal aortic aneurysm. Aortic atherosclerosis incidentally noted.  Reproductive: No mass or other significant abnormality.  Other: None.  Musculoskeletal: No suspicious bone lesions identified. 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  IMPRESSION: Bilateral nephrolithiasis, with 9 mm calculus in right renal pelvis. No evidence of ureteral calculi, hydronephrosis, or other acute findings.   Electronically Signed By: Marlaine Hind M.D. On: 09/29/2019 09:25  I have personally reviewed the images and agree with radiologist interpretation.   Assessment and Plan:  1.Intermittent Right flank pain  CT renal stone study from 09/29/19 revealed bilateral nephrolithiasis, with 9 mm calculus in right renal pelvis. We discussed various treatment options including ESWL prior to Urolift procedure vs. ureteroscopy, laser lithotripsy, and stent during time of Urolift. We discussed the risks and benefits of both including bleeding, infection, damage to surrounding structures, efficacy with need for possible further intervention, and need for temporary ureteral stent. Warning symptoms reviewed including risks and benefits  Pt has elected ureteroscopy, laser lithotripsy and stent at same time of Urolift/bladder bx  Likely leave stent w/o string given he is having an outlet procedure   2.BPH with outlet obstruction  Scheduled for upcoming Urolift/bladder bx on 11/10/2019   3.History of bladder cancer  Bladder erythema at the dome,stable from last cystoscopy without regression or progression  Given his history of bladder cancer, I recommend proceeding with biopsy of this lesion especially in light  of him undergoing an outlet procedure as above.    I discussed the assessment and treatment plan with the patient. The patient was provided an opportunity to ask questions and all were answered. The patient agreed with the plan and demonstrated an understanding of the instructions.  The patient was advised to call back or seek an in-person evaluation if the symptoms worsen or if the condition fails to improve as anticipated.   Hollice Espy, MD

## 2019-11-10 NOTE — Progress Notes (Signed)
Patient is having trouble passing his urine so I bladder scanned him and he had 423 in his bladder. Dr. Erlene Quan stated to insert a 18 fr coude catheter. So a catheter will be placed. Dr. Erlene Quan said the patient will need to keep the catheter in for 2 days but he will need to call the office tomorrow.

## 2019-11-10 NOTE — Discharge Instructions (Signed)
You have a ureteral stent in place.  This is a tube that extends from your kidney to your bladder.  This may cause urinary bleeding, burning with urination, and urinary frequency.  Please call our office or present to the ED if you develop fevers >101 or pain which is not able to be controlled with oral pain medications.  You may be given either Flomax and/ or ditropan to help with bladder spasms and stent pain in addition to pain medications.    [facility] [address] [place], [place] [address] [phone]  Urolift Post-Operative Instructions     Patient Expectations   1. Mild blood in your urine for about 1 week.  2. Urinary buring, frequency, and urgency for 10 days.  3. Mild pelvic pain 1-2 weeks.     Return to Activity     1. Drink water post procedure.  2. Take meds as needed.  Tylenol and/or Motrin is most helpful.  You may also by Pyridium/Azo over-the-counter for urinary burning.  3. No lifting or straining 48hrs.  4. Other activity when they feel up to it.

## 2019-11-10 NOTE — Anesthesia Procedure Notes (Signed)
Procedure Name: LMA Insertion Date/Time: 11/10/2019 12:00 PM Performed by: Nelda Marseille, CRNA Pre-anesthesia Checklist: Patient identified, Patient being monitored, Timeout performed, Emergency Drugs available and Suction available Patient Re-evaluated:Patient Re-evaluated prior to induction Oxygen Delivery Method: Circle system utilized Preoxygenation: Pre-oxygenation with 100% oxygen Induction Type: IV induction Ventilation: Mask ventilation without difficulty LMA: LMA inserted LMA Size: 4.0 Tube type: Oral Number of attempts: 1 Placement Confirmation: positive ETCO2 and breath sounds checked- equal and bilateral Tube secured with: Tape Dental Injury: Teeth and Oropharynx as per pre-operative assessment

## 2019-11-10 NOTE — Anesthesia Preprocedure Evaluation (Addendum)
Anesthesia Evaluation  Patient identified by MRN, date of birth, ID band Patient awake    Reviewed: Allergy & Precautions, H&P , NPO status , Patient's Chart, lab work & pertinent test results  Airway Mallampati: III  TM Distance: <3 FB     Dental  (+) Upper Dentures   Pulmonary shortness of breath and with exertion, asthma , neg sleep apnea, COPD, Not current smoker, former smoker,    breath sounds clear to auscultation       Cardiovascular (-) angina+ CAD, + Past MI and + Cardiac Stents   Rhythm:regular Rate:Normal     Neuro/Psych PSYCHIATRIC DISORDERS Depression negative neurological ROS     GI/Hepatic Neg liver ROS, GERD  ,  Endo/Other  diabetes  Renal/GU Renal stones     Musculoskeletal   Abdominal   Peds  Hematology negative hematology ROS (+)   Anesthesia Other Findings Past Medical History: No date: Arthritis No date: Asthma 2018: Cancer ([place])     Comment:  bladder No date: Cataracts, bilateral No date: Cold No date: COPD (chronic obstructive pulmonary disease) ([facility]) No date: Coronary artery disease No date: DDD (degenerative disc disease), cervical No date: Depression No date: Diabetes mellitus without complication ([facility]) No date: Dyspnea No date: GERD (gastroesophageal reflux disease) No date: Hearing loss associated with syndrome of both ears 2016: Heart attack ([place])     Comment:  stented x 1 No date: History of aspiration pneumonitis No date: History of kidney stones No date: Hyperlipidemia No date: Kidney cysts No date: Kidney stones No date: Osteoarthritis of back No date: Psoriasis No date: Psoriasis No date: Seasonal allergies  Past Surgical History: 2016 and 2018: CARDIAC CATHETERIZATION No date: COLONOSCOPY 09/18/2017: COLONOSCOPY WITH PROPOFOL; N/A     Comment:  Procedure: COLONOSCOPY WITH PROPOFOL;  Surgeon:               Lollie Sails, MD;  Location: [facility] ENDOSCOPY;                 Service: Endoscopy;  Laterality: N/A; 2016: CORONARY ANGIOPLASTY 2016: CORONARY STENT PLACEMENT 06/12/2016: CYSTOSCOPY W/ RETROGRADES; Bilateral     Comment:  Procedure: CYSTOSCOPY WITH RETROGRADE PYELOGRAM;                Surgeon: Hollice Espy, MD;  Location: [facility] ORS;                Service: Urology;  Laterality: Bilateral; 10/02/2017: CYSTOSCOPY W/ RETROGRADES; Bilateral     Comment:  Procedure: CYSTOSCOPY WITH RETROGRADE PYELOGRAM;                Surgeon: Hollice Espy, MD;  Location: [facility] ORS;                Service: Urology;  Laterality: Bilateral; 10/02/2017: CYSTOSCOPY WITH BIOPSY; N/A     Comment:  Procedure: CYSTOSCOPY WITH Bladder BIOPSY;  Surgeon:               Hollice Espy, MD;  Location: [facility]S;  Service:               Urology;  Laterality: N/A; 10/02/2017: CYSTOSCOPY WITH URETEROSCOPY; Right     Comment:  Procedure: DIAGNOSTIC RIGHT URETEROSCOPY;  Surgeon:               Hollice Espy, MD;  Location: [facility]S;  Service:               Urology;  Laterality: Right; 09/18/2017: ESOPHAGOGASTRODUODENOSCOPY (EGD) WITH PROPOFOL; N/A  Comment:  Procedure: ESOPHAGOGASTRODUODENOSCOPY (EGD) WITH               PROPOFOL;  Surgeon: Lollie Sails, MD;  Location:               [facility] ENDOSCOPY;  Service: Endoscopy;  Laterality: N/A; No date: fatty tumors removed 1992: HIATAL HERNIA REPAIR No date: LIPOMA EXCISION     Comment:  right leg 06/13/2018: NASOPHARYNGOSCOPY EUSTATION TUBE BALLOON DILATION;  Bilateral     Comment:  Procedure: NASOPHARYNGOSCOPY EUSTATION TUBE BALLOON               DILATION DIABETIC, NEED EUSTATION TUBE BALLOON SYSTEM;                Surgeon: Margaretha Sheffield, MD;  Location: [place];  Service: ENT;  Laterality: Bilateral; 12/21/2016: RIGHT/LEFT HEART CATH AND CORONARY ANGIOGRAPHY; N/A     Comment:  Procedure: Right/Left Heart Cath and Coronary               Angiography;  Surgeon: Isaias Cowman, MD;                 Location: [place] CV LAB;  Service: Cardiovascular;              Laterality: N/A; 1992: SINUS EXPLORATION 06/12/2016: TRANSURETHRAL RESECTION OF BLADDER TUMOR WITH MITOMYCIN-C;  N/A     Comment:  Procedure: TRANSURETHRAL RESECTION OF BLADDER TUMOR WITH              MITOMYCIN-C;  Surgeon: Hollice Espy, MD;  Location:               [facility] ORS;  Service: Urology;  Laterality: N/A; 06/13/2018: TURBINATE REDUCTION; Bilateral     Comment:  Procedure: TURBINATE OUTFRACTURE;  Surgeon: Margaretha Sheffield, MD;  Location: [place];  Service: ENT;               Laterality: Bilateral;  Diabetic - insulin 09/09/2438: UMBILICAL HERNIA REPAIR; N/A     Comment:  Procedure: HERNIA REPAIR UMBILICAL ADULT;  Surgeon:               Christene Lye, MD;  Location: [facility] ORS;  Service:              General;  Laterality: N/A; 06/12/2016: URETEROSCOPY; Right     Comment:  Procedure: URETEROSCOPY;  Surgeon: Hollice Espy, MD;                Location: [facility] ORS;  Service: Urology;  Laterality:               Right;     Reproductive/Obstetrics negative OB ROS                            Anesthesia Physical Anesthesia Plan  ASA: III  Anesthesia Plan: General LMA   Post-op Pain Management:    Induction:   PONV Risk Score and Plan: Ondansetron, Dexamethasone and Treatment may vary due to age or medical condition  Airway Management Planned:   Additional Equipment:   Intra-op Plan:   Post-operative Plan:   Informed Consent: I have reviewed the patients History and Physical, chart, labs and discussed the procedure including the risks, benefits and alternatives for the proposed anesthesia with the patient or  authorized representative who has indicated his/her understanding and acceptance.     Dental Advisory Given  Plan Discussed with: Anesthesiologist, CRNA and Surgeon  Anesthesia Plan Comments:      Anesthesia Quick Evaluation

## 2019-11-10 NOTE — Transfer of Care (Signed)
Immediate Anesthesia Transfer of Care Note  Patient: Dustin Blackburn  Procedure(s) Performed: CYSTOSCOPY WITH Bladder BIOPSY (N/A Bladder) CYSTOSCOPY WITH INSERTION OF UROLIFT (N/A Prostate) URETEROSCOPY WITH HOLMIUM LASER LITHOTRIPSY (Right Ureter) CYSTOSCOPY WITH STENT PLACEMENT (Right Ureter)  Patient Location: PACU  Anesthesia Type:General  Level of Consciousness: sedated  Airway & Oxygen Therapy: Patient Spontanous Breathing and Patient connected to face mask oxygen  Post-op Assessment: Report given to RN and Post -op Vital signs reviewed and stable  Post vital signs: Reviewed and stable  Last Vitals:  Vitals Value Taken Time  BP 124/71 11/10/19 1307  Temp 36.1 C 11/10/19 1307  Pulse 85 11/10/19 1314  Resp 13 11/10/19 1314  SpO2 98 % 11/10/19 1314  Vitals shown include unvalidated device data.  Last Pain:  Vitals:   11/10/19 1307  TempSrc:   PainSc: Asleep         Complications: No complications documented.

## 2019-11-10 NOTE — Op Note (Signed)
Date of procedure: 11/10/19  Preoperative diagnosis:  1. Right kidney stone 2. Bladder lesion 3. BPH with outlet obstruction  Postoperative diagnosis:  1. Same as above  Procedure: 1. Right ureteroscopy with laser lithotripsy 2. Right retrograde pyelogram 3. Right ureteral stent placement 4. Basket extraction of stone fragment 5. Bladder biopsy 6. Insertion of UroLift implant   Surgeon: Hollice Espy, MD  Anesthesia: General  Complications: None  Intraoperative findings: A total of 5 attempted implants, 4 implants retained.  Bladder erythema on left dome, biopsied.  8 mm right renal pelvic stone with bifid type collecting system.  EBL: Minimal  Specimens: Stone fragment, bladder biopsy  Drains: 6 x 26 French double-J ureteral stent on right  Indication: Dustin Blackburn is a 79 y.o. patient with multiple GU issues including history of nephrolithiasis, BPH with outlet obstruction as well as history of bladder cancer with bladder erythema.  He elected to address all 3 issues concomitantly today as per HPI after reviewing the management options for treatment, he elected to proceed with the above surgical procedure(s). We have discussed the potential benefits and risks of the procedure, side effects of the proposed treatment, the likelihood of the patient achieving the goals of the procedure, and any potential problems that might occur during the procedure or recuperation. Informed consent has been obtained.  Description of procedure:  The patient was taken to the operating room and general anesthesia was induced.  The patient was placed in the dorsal lithotomy position, prepped and draped in the usual sterile fashion, and preoperative antibiotics were administered. A preoperative time-out was performed.   A 21 French scope was advanced per urethra into the bladder.  Notably, the patient had significant bilobar coaptation and a mildly trabeculated bladder.  Attention was first turned  to the right ureteral orifice.  Was cannulated using a 5 [place] open-ended ureteral catheter and a sensor wire was placed up to the level of the kidney without difficulty.  A dual-lumen access sheath was then used to introduce a Super Stiff wire as a working wire.  The safety wire was snapped in place.  A Cook 12/14 ureteral access sheath was advanced very easily over the Super Stiff wire up to level the proximal ureter.  The wire and inner cannula were removed and a dual-lumen digital flexible ureteroscope was advanced through the scope into the kidney.  The kidney had a slightly unusual collecting system which is almost bifid in nature.  The stone was encountered measuring approximately 8 mm.  I used dusting settings of 0.2 J and 40 Hz to dust the stone into very tiny fragments.  The larger fragments were then removed using tipless nitinol basket.  In another portion of the kidney, few smaller stones were identified and able to be basketed without fragmentation.  Finally, contrast was injected into the collecting system and no extravasation was appreciated.  This created a roadmap to ensure that each every calyx was directly visualized.  Once the renal unit was adequately cleared of all stone burden, the scope was backed in length the ureter removing the access sheath along the way.  No significant residual stone fragments remained and there was no ureteral injuries appreciated.  Finally, the safety wire was backloaded over rigid cystoscope.  A 6 x 26 French double-J ureteral stent was advanced over the wire up to the level of the kidney.  The wires partially removed until full coils noted within the renal pelvis and then ultimately finally completely removed and full  coils noted within the bladder.  Next, attention was turned to bladder biopsy of the bladder erythema.  This was located, stable in size approximately 1 cm on the left dome of the bladder unchanged from previous cystoscopies.  Use cold cup biopsy  forceps to biopsy this.  He is relatively fibrotic and scarred-like in nature.  Bugbee electrocautery was then used to completely fulgurate this area for hemostasis and distraction purposes.  Finally, the cystoscope was exchanged for a 17 [place] UroLift scope.  A set of implants were deployed proximal and 1.5 cm distal to the bladder neck to create an anterior channel.  A second set of implants were deployed near the level of the verumontanum anteriorly which created a nice widely open channel.  Unfortunately, the left clip did hang over the edge of the bladder neck slightly into the bladder and thus ultimately the decision was made to remove this particular implant.  I used graspers and ultimately biopsy forceps to completely remove the entire clip.  Notably, the clip including both ends and suture were completely pulled out and removed.  The Bugbee was used to fulgurate this area for hemostasis.  Finally, 1/5 implant was deployed this time more distally in a similar location.  This created a nice wide open anterior channel.  The location of these clips were satisfactory and hemostasis was adequate.  A total of 4 implanted devices were made at the end of the procedure.  5 device implants were attempted.  The bladder was then drained and the scope was removed.  The patient was then cleaned and dried, reversed from anesthesia and taken the PACU in stable condition.  Plan: We will have him return next week for cystoscopy, stent removal.  Hollice Espy, M.D.

## 2019-11-11 ENCOUNTER — Encounter: Payer: Self-pay | Admitting: Urology

## 2019-11-11 LAB — SURGICAL PATHOLOGY

## 2019-11-11 NOTE — Anesthesia Postprocedure Evaluation (Signed)
Anesthesia Post Note  Patient: Dustin Blackburn  Procedure(s) Performed: CYSTOSCOPY WITH Bladder BIOPSY (N/A Bladder) CYSTOSCOPY WITH INSERTION OF UROLIFT (N/A Prostate) URETEROSCOPY WITH HOLMIUM LASER LITHOTRIPSY (Right Ureter) CYSTOSCOPY WITH STENT PLACEMENT (Right Ureter)  Anesthesia Type: General Level of consciousness: awake and alert and oriented Pain management: pain level controlled Vital Signs Assessment: post-procedure vital signs reviewed and stable Respiratory status: spontaneous breathing Cardiovascular status: blood pressure returned to baseline Anesthetic complications: no   No complications documented.   Last Vitals:  Vitals:   11/10/19 1400 11/10/19 1822  BP: 139/85 138/85  Pulse: 79 95  Resp: 18 18  Temp: (!) 36.1 C (!) 36.2 C  SpO2: 94% 95%    Last Pain:  Vitals:   11/10/19 1822  TempSrc: Temporal  PainSc: 3                  [name]

## 2019-11-14 ENCOUNTER — Telehealth: Payer: Self-pay | Admitting: Urology

## 2019-11-14 MED ORDER — HYDROCODONE-ACETAMINOPHEN 5-325 MG PO TABS: 1.0000 | ORAL_TABLET | Freq: Four times a day (QID) | ORAL | 0 refills | Status: DC | PRN

## 2019-11-14 NOTE — Telephone Encounter (Signed)
Called and left a message to return the call. Did not leave a detailed message as the voicemail was generic.   Sharyn Lull

## 2019-11-14 NOTE — Telephone Encounter (Signed)
Patient's wife called and left a voicemail asking for more pain meds to be called in. Please call them back @ [phone]   Thanks, Sharyn Lull

## 2019-11-14 NOTE — Telephone Encounter (Signed)
Please contact the patient and inform him that I have refilled his pain medication.  He needs to use these sparingly as they need to last him until his stent removal next week.  Also, please make sure that he is using oxybutynin up to every 8 hours, as this will relieve the source of his discomfort.

## 2019-11-17 LAB — CALCULI, WITH PHOTOGRAPH (CLINICAL LAB)
Calcium Oxalate Monohydrate: 20 %
Uric Acid Calculi: 80 %
Weight Calculi: 78 mg

## 2019-11-18 NOTE — Progress Notes (Signed)
° °  11/19/19   CC:  Chief Complaint  Patient presents with   Cysto Stent Removal    HPI: Dustin Blackburn is a 79 y.o. M who returns today for cysto/stent removal s/p cystoscopy w/ bladder bx, Urolift, ureteroscopy/lithotripsy, and stent placement on 11/10/19.   Path report of bladder bx revealed partially denuded urothelial mucosa w/ edema and mixed inflammatory infiltrate negative for malignancy.   Stone analysis revealed 20% calcium oxalate monohydrate and 80% uric acid.   Today's Vitals   11/19/19 1016  BP: (!) 164/93  Pulse: 71   There is no height or weight on file to calculate BMI. NED. A&Ox3.   No respiratory distress   Abd soft, NT, ND Normal phallus with bilateral descended testicles   Stent removal procedure  Patient identification was confirmed, informed consent was obtained, and patient was prepped using Betadine solution.  Lidocaine jelly was administered per urethral meatus.    Preoperative abx where received prior to procedure.    Procedure: - Flexible cystoscope introduced, without any difficulty.   - Thorough search of the bladder revealed:    normal urethral meatus  Stent seen emanating from right ureteral orifice, grasped with stent graspers, and removed in entirety.     Post-Procedure: - Patient tolerated the procedure well  Post-Procedure: - Patient tolerated the procedure well  Assessment/ Plan:  1. Bladder lesion  Bx of lesion on dome negative Reviewed surgical pathology in detail today  2. Kidney stone S/p ureterosocpy  Stone analysis revealed  20% calcium oxalate monohydrate and 80% uric acid.  May benefit from metabolic evaluation / stone prevention meds Will discuss more at f/u  F/u in 6 weeks w/ RUS  3. BPH w/ outlet obstruction  S/p HoLEP Will access PVR and IPSS at f/u visit  F/u in 6 weeks w/ RUS/ PVR/ IPSS  I, Nethusan Sivanesan, am acting as a scribe for Dr. Hollice Espy,  I have reviewed the above documentation for  accuracy and completeness, and I agree with the above.   Hollice Espy, MD

## 2019-11-19 ENCOUNTER — Other Ambulatory Visit: Payer: Self-pay

## 2019-11-19 ENCOUNTER — Encounter: Payer: Self-pay | Admitting: Urology

## 2019-11-19 ENCOUNTER — Ambulatory Visit (INDEPENDENT_AMBULATORY_CARE_PROVIDER_SITE_OTHER): Payer: Medicare HMO | Admitting: Urology

## 2019-11-19 VITALS — BP 164/93 | HR 71

## 2019-11-19 DIAGNOSIS — Z87442 Personal history of urinary calculi: Secondary | ICD-10-CM | POA: Diagnosis not present

## 2019-11-19 MED ORDER — CIPROFLOXACIN HCL 500 MG PO TABS
500.0000 mg | ORAL_TABLET | Freq: Once | ORAL | Status: AC
  Administered 2019-11-19: 500 mg via ORAL

## 2019-11-20 LAB — URINALYSIS, COMPLETE
Bilirubin, UA: NEGATIVE
Glucose, UA: NEGATIVE
Ketones, UA: NEGATIVE
Nitrite, UA: NEGATIVE
Specific Gravity, UA: 1.015 (ref 1.005–1.030)
Urobilinogen, Ur: 0.2 mg/dL (ref 0.2–1.0)
pH, UA: 5 (ref 5.0–7.5)

## 2019-11-20 LAB — MICROSCOPIC EXAMINATION
Bacteria, UA: NONE SEEN
RBC, Urine: >30 /hpf — AB (ref 0–2)

## 2019-11-25 ENCOUNTER — Ambulatory Visit: Admit: 2019-11-25 | Discharge: 2019-11-26 | Payer: MEDICARE

## 2019-11-25 DIAGNOSIS — G8929 Other chronic pain: Secondary | ICD-10-CM

## 2019-11-25 DIAGNOSIS — M545 Low back pain: Principal | ICD-10-CM

## 2019-11-25 MED ORDER — MELOXICAM 7.5 MG TABLET
ORAL_TABLET | Freq: Every day | ORAL | 1 refills | 30.00000 days | Status: CP
Start: 2019-11-25 — End: 2019-12-25

## 2019-12-16 ENCOUNTER — Ambulatory Visit: Admit: 2019-12-16 | Discharge: 2019-12-17 | Payer: MEDICARE | Attending: Family | Primary: Family

## 2019-12-16 DIAGNOSIS — M5030 Other cervical disc degeneration, unspecified cervical region: Principal | ICD-10-CM

## 2019-12-16 DIAGNOSIS — R350 Frequency of micturition: Principal | ICD-10-CM

## 2019-12-16 DIAGNOSIS — E119 Type 2 diabetes mellitus without complications: Principal | ICD-10-CM

## 2019-12-16 DIAGNOSIS — M1990 Unspecified osteoarthritis, unspecified site: Principal | ICD-10-CM

## 2019-12-16 DIAGNOSIS — N200 Calculus of kidney: Principal | ICD-10-CM

## 2019-12-16 MED ORDER — HYDROCODONE 5 MG-ACETAMINOPHEN 325 MG TABLET
ORAL_TABLET | Freq: Four times a day (QID) | ORAL | 0 refills | 4.00000 days | Status: CP | PRN
Start: 2019-12-16 — End: ?

## 2019-12-16 MED ORDER — NEOMYCIN-POLYMYXIN-HYDROCORT 3.5 MG/ML-10,000 UNIT/ML-1 % EAR SOLUTION
Freq: Four times a day (QID) | OTIC | 0 refills | 0.00000 days | Status: CP
Start: 2019-12-16 — End: ?

## 2019-12-16 MED ORDER — OXYBUTYNIN CHLORIDE 5 MG TABLET
ORAL_TABLET | Freq: Three times a day (TID) | ORAL | 0 refills | 10.00000 days | Status: CP
Start: 2019-12-16 — End: ?

## 2019-12-29 ENCOUNTER — Other Ambulatory Visit: Payer: Self-pay

## 2019-12-29 ENCOUNTER — Ambulatory Visit
Admission: RE | Admit: 2019-12-29 | Discharge: 2019-12-29 | Disposition: A | Discharge status: Home / Self Care | Payer: Medicare HMO | Source: Ambulatory Visit | Attending: Urology | Admitting: Urology

## 2019-12-29 DIAGNOSIS — Z87442 Personal history of urinary calculi: Secondary | ICD-10-CM | POA: Diagnosis not present

## 2019-12-29 DIAGNOSIS — N200 Calculus of kidney: Secondary | ICD-10-CM | POA: Insufficient documentation

## 2019-12-29 DIAGNOSIS — N281 Cyst of kidney, acquired: Secondary | ICD-10-CM | POA: Diagnosis not present

## 2019-12-29 NOTE — Progress Notes (Signed)
12/30/2019 1:08 PM   Dustin Blackburn December 25, 1940 [id number]  Referring provider: [facility], [facility] [place],   [id number] Chief Complaint  Patient presents with   Follow-up    HPI: Dustin Blackburn is a 79 y.o. male with a history of kidney stones, BPH w/ outlet obstruction , bladder lesion, and right renal stones returns for follow up  .  Previous history: He was initially found to have incidental bladder wall thickening on CT abdomen pelvis on 03/2016. Cystoscopy showed a papillary proximal and 1 cm tumor at the dome of the bladder. He was taken to the operating room on 06/12/2016 for TURBT, bilateral retrograde, and instillation ofmitomycin.  Surgical pathology was consistent with low-grade, noninvasive tumor.  On 10/02/2017 for cystoscopy, bladder biopsy, bilateral retrograde pyelograms secondary to bladder erythema.  Surgical pathology was benign with evidence of squamous metaplasia and reactive changes, scarlike fibrosis, but no evidence of malignancy or dysplasia.   Patient now s/p cystoscopy w/ bladder bx, Urolift, ureteroscopy/lithotripsy, and stent placement on 11/10/19.   Path report of bladder bx revealed partially denuded urothelial mucosa w/ edema and mixed inflammatory infiltrate negative for malignancy.   Stone analysis revealed 20% calcium oxalate monohydrate and 80% uric acid.   RUS on 12/29/2019 revealed numerous bilateral renal cysts. Right lower pole nephrolithiasis. No hydronephrosis.  Today his PVR is 84 mL. After Urolift he experienced some bleeding and incontinence which have since resolved. Symptoms have improved s/p Urolift. His stream is much better. He denies pain with urination. He stopped Flomax x 1 week ago and did not noticed a difference.   He reports drinking plenty of fluid and Gatorade. His back pain has improved.    IPSS    Row Name 12/30/19 1400         International Prostate Symptom Score   How often have you had the  sensation of not emptying your bladder? Not at All     How often have you had to urinate less than every two hours? Not at All     How often have you found you stopped and started again several times when you urinated? Not at All     How often have you found it difficult to postpone urination? Not at All     How often have you had a weak urinary stream? Less than half the time     How often have you had to strain to start urination? Less than 1 in 5 times     How many times did you typically get up at night to urinate? 2 Times     Total IPSS Score 5       Quality of Life due to urinary symptoms   If you were to spend the rest of your life with your urinary condition just the way it is now how would you feel about that? Mostly Satisfied            Score:  1-7 Mild 8-19 Moderate 20-35 Severe  PMH: Past Medical History:  Diagnosis Date   Arthritis    Asthma    Cancer ([place]) 2018   bladder   Cataracts, bilateral    Cold    COPD (chronic obstructive pulmonary disease) (HCC)    Coronary artery disease    DDD (degenerative disc disease), cervical    Depression    Diabetes mellitus without complication (HCC)    Dyspnea    GERD (gastroesophageal reflux disease)    Hearing loss  associated with syndrome of both ears    Heart attack ([place]) 2016   stented x 1   History of aspiration pneumonitis    History of kidney stones    Hyperlipidemia    Kidney cysts    Kidney stones    Osteoarthritis of back    Psoriasis    Psoriasis    Seasonal allergies     Surgical History: Past Surgical History:  Procedure Laterality Date   CARDIAC CATHETERIZATION  2016 and 2018   COLONOSCOPY     COLONOSCOPY WITH PROPOFOL N/A 09/18/2017   Procedure: COLONOSCOPY WITH PROPOFOL;  Surgeon: Lollie Sails, MD;  Location: [facility] ENDOSCOPY;  Service: Endoscopy;  Laterality: N/A;   CORONARY ANGIOPLASTY  2016   CORONARY STENT PLACEMENT  2016   CYSTOSCOPY W/ RETROGRADES  Bilateral 06/12/2016   Procedure: CYSTOSCOPY WITH RETROGRADE PYELOGRAM;  Surgeon: Hollice Espy, MD;  Location: [facility] ORS;  Service: Urology;  Laterality: Bilateral;   CYSTOSCOPY W/ RETROGRADES Bilateral 10/02/2017   Procedure: CYSTOSCOPY WITH RETROGRADE PYELOGRAM;  Surgeon: Hollice Espy, MD;  Location: [facility] ORS;  Service: Urology;  Laterality: Bilateral;   CYSTOSCOPY WITH BIOPSY N/A 10/02/2017   Procedure: CYSTOSCOPY WITH Bladder BIOPSY;  Surgeon: Hollice Espy, MD;  Location: [facility] ORS;  Service: Urology;  Laterality: N/A;   CYSTOSCOPY WITH BIOPSY N/A 11/10/2019   Procedure: CYSTOSCOPY WITH Bladder BIOPSY;  Surgeon: Hollice Espy, MD;  Location: [facility] ORS;  Service: Urology;  Laterality: N/A;   CYSTOSCOPY WITH INSERTION OF UROLIFT N/A 11/10/2019   Procedure: CYSTOSCOPY WITH INSERTION OF UROLIFT;  Surgeon: Hollice Espy, MD;  Location: [facility] ORS;  Service: Urology;  Laterality: N/A;   CYSTOSCOPY WITH STENT PLACEMENT Right 11/10/2019   Procedure: CYSTOSCOPY WITH STENT PLACEMENT;  Surgeon: Hollice Espy, MD;  Location: [facility]S;  Service: Urology;  Laterality: Right;   CYSTOSCOPY WITH URETEROSCOPY Right 10/02/2017   Procedure: DIAGNOSTIC RIGHT URETEROSCOPY;  Surgeon: Hollice Espy, MD;  Location: [facility];  Service: Urology;  Laterality: Right;   ESOPHAGOGASTRODUODENOSCOPY (EGD) WITH PROPOFOL N/A 09/18/2017   Procedure: ESOPHAGOGASTRODUODENOSCOPY (EGD) WITH PROPOFOL;  Surgeon: Lollie Sails, MD;  Location: [facility] ENDOSCOPY;  Service: Endoscopy;  Laterality: N/A;   fatty tumors removed     HIATAL HERNIA REPAIR  1992   LIPOMA EXCISION     right leg   NASOPHARYNGOSCOPY EUSTATION TUBE BALLOON DILATION Bilateral 06/13/2018   Procedure: NASOPHARYNGOSCOPY EUSTATION TUBE BALLOON DILATION DIABETIC, NEED EUSTATION TUBE BALLOON SYSTEM;  Surgeon: Margaretha Sheffield, MD;  Location: [facility];  Service: ENT;  Laterality: Bilateral;   RIGHT/LEFT HEART CATH AND CORONARY ANGIOGRAPHY N/A  12/21/2016   Procedure: Right/Left Heart Cath and Coronary Angiography;  Surgeon: Isaias Cowman, MD;  Location: [facility];  Service: Cardiovascular;  Laterality: N/A;   SINUS EXPLORATION  1992   TRANSURETHRAL RESECTION OF BLADDER TUMOR WITH MITOMYCIN-C N/A 06/12/2016   Procedure: TRANSURETHRAL RESECTION OF BLADDER TUMOR WITH MITOMYCIN-C;  Surgeon: Hollice Espy, MD;  Location: [facility];  Service: Urology;  Laterality: N/A;   TURBINATE REDUCTION Bilateral 06/13/2018   Procedure: Orrin Brigham;  Surgeon: Margaretha Sheffield, MD;  Location: [facility];  Service: ENT;  Laterality: Bilateral;  Diabetic - insulin   UMBILICAL HERNIA REPAIR N/A 06/12/2016   Procedure: HERNIA REPAIR UMBILICAL ADULT;  Surgeon: Christene Lye, MD;  Location: [facility] ORS;  Service: General;  Laterality: N/A;   URETEROSCOPY Right 06/12/2016   Procedure: URETEROSCOPY;  Surgeon: Hollice Espy, MD;  Location: [facility] ORS;  Service: Urology;  Laterality: Right;   URETEROSCOPY  WITH HOLMIUM LASER LITHOTRIPSY Right 11/10/2019   Procedure: URETEROSCOPY WITH HOLMIUM LASER LITHOTRIPSY;  Surgeon: Hollice Espy, MD;  Location: [facility] ORS;  Service: Urology;  Laterality: Right;    Home Medications:  Allergies as of 12/30/2019      Reactions   Actos [pioglitazone] Palpitations   Pt reported a MI    Glimepiride Rash   Shortness of breath also   Glipizide Shortness Of Breath   Metformin Shortness Of Breath   Pt states he "can't breathe and it gives him sinus congestion".   Amoxicillin Nausea Only   Erythromycin Base Nausea Only   Esomeprazole Magnesium Other (See Comments)   Makes patients heart burn worse   Lipitor [atorvastatin]    indigestion   Liraglutide Other (See Comments)   bloating   Penicillins Nausea Only   Has patient had a PCN reaction causing immediate rash, facial/tongue/throat swelling, SOB or lightheadedness with hypotension: no Has patient had a PCN reaction causing severe rash  involving mucus membranes or skin necrosis: no Has patient had a PCN reaction that required hospitalization:no Has patient had a PCN reaction occurring within the last 10 years: no If all of the above answers are "NO", then may proceed with Cephalosporin use.   Sitagliptin Other (See Comments)   Pt states it "gives him joint pain" & redness      Medication List       Accurate as of December 30, 2019 11:59 PM. If you have any questions, ask your nurse or doctor.        STOP taking these medications   HYDROcodone-acetaminophen 5-325 MG tablet Commonly known as: NORCO/VICODIN Stopped by: Hollice Espy, MD   oxybutynin 5 MG tablet Commonly known as: DITROPAN Stopped by: Hollice Espy, MD   tamsulosin 0.4 MG Caps capsule Commonly known as: FLOMAX Stopped by: Hollice Espy, MD     TAKE these medications   albuterol 108 (90 Base) MCG/ACT inhaler Commonly known as: VENTOLIN HFA Inhale 2 puffs into the lungs every 6 (six) hours as needed for wheezing or shortness of breath.   clobetasol cream 0.05 % Commonly known as: TEMOVATE Apply 1 application topically 2 (two) times daily as needed (psoriasis).   hydrocortisone 10 MG tablet Commonly known as: CORTEF Take 10 mg by mouth daily. Was ordered by dr. Sheppard Evens ([place])   ReliOn Pen Needles 31G X 6 MM Misc Generic drug: Insulin Pen Needle   Symbicort 160-4.5 MCG/ACT inhaler Generic drug: budesonide-formoterol Inhale 2 puffs into the lungs 2 (two) times daily as needed for shortness of breath.       Allergies:  Allergies  Allergen Reactions   Actos [Pioglitazone] Palpitations    Pt reported a MI    Glimepiride Rash    Shortness of breath also   Glipizide Shortness Of Breath   Metformin Shortness Of Breath    Pt states he "can't breathe and it gives him sinus congestion".    Amoxicillin Nausea Only   Erythromycin Base Nausea Only   Esomeprazole Magnesium Other (See Comments)    Makes patients heart burn  worse   Lipitor [Atorvastatin]     indigestion   Liraglutide Other (See Comments)    bloating   Penicillins Nausea Only    Has patient had a PCN reaction causing immediate rash, facial/tongue/throat swelling, SOB or lightheadedness with hypotension: no Has patient had a PCN reaction causing severe rash involving mucus membranes or skin necrosis: no Has patient had a PCN reaction that required hospitalization:no Has patient had a  PCN reaction occurring within the last 10 years: no If all of the above answers are "NO", then may proceed with Cephalosporin use.    Sitagliptin Other (See Comments)    Pt states it "gives him joint pain" & redness     Family History: Family History  Problem Relation Age of Onset   Cancer Mother    Prostate cancer Maternal Uncle    Prostate cancer Paternal Uncle    Prostate cancer Cousin    Congestive Heart Failure Father    Kidney cancer Neg Hx    Bladder Cancer Neg Hx     Social History:  reports that he quit smoking about 29 years ago. His smoking use included cigarettes. He quit after 30.00 years of use. He has quit using smokeless tobacco.  His smokeless tobacco use included chew. He reports current alcohol use. He reports that he does not use drugs.   Physical Exam: BP (!) 153/84    Pulse 87   Constitutional:  Alert and oriented, No acute distress. HEENT: [place] AT, moist mucus membranes.  Trachea midline, no masses. Cardiovascular: No clubbing, cyanosis, or edema. Respiratory: Normal respiratory effort, no increased work of breathing. Skin: No rashes, bruises or suspicious lesions. Neurologic: Grossly intact, no focal deficits, moving all 4 extremities. Psychiatric: Normal mood and affect.  Laboratory Data:  Lab Results  Component Value Date   CREATININE 1.21 11/06/2019    Pertinent Imaging: Results for orders placed or performed in visit on 12/30/19  Bladder Scan (Post Void Residual) in office  Result Value Ref Range   Scan  Result 84     Results for orders placed during the hospital encounter of 12/29/19  Ultrasound renal complete  Narrative CLINICAL DATA:  History of stones  EXAM: RENAL / URINARY TRACT ULTRASOUND COMPLETE  COMPARISON:  CT 09/29/2019  FINDINGS: Right Kidney:  Renal measurements: 13.3 x 6.7 x 6.1 cm = volume: 286 mL. Numerous cysts, the largest 4.2 cm in the midpole. Normal echotexture. No hydronephrosis. 15 mm lower pole stone noted.  Left Kidney:  Renal measurements: 15.3 x 6.8 x 6.3 cm = volume: 339 mL. Multiple cysts, the largest 6.6 cm in the lower pole. Normal echotexture. No hydronephrosis. Previously seen stones not visualized.  Bladder:  Appears normal for degree of bladder distention.  Other:  None.  IMPRESSION: Numerous bilateral renal cysts.  Right lower pole nephrolithiasis.  No hydronephrosis.   Electronically Signed By: Rolm Baptise M.D. On: 12/29/2019 12:54  I have personally reviewed the images and agree with radiologist interpretation.    Assessment & Plan:    1. History of bladder cancer/ recent bladder lesion Most recent biopsy benign Patient has had 2 biopsies in the past both negative.  Will continue cytsoscopy annually, due 11/2020  2. Kidney stone S/p successful ureteroscopy Reviewed stone analysis today No residual hydro on RUS Encouraged fluid intake- stone diet recs KUB in 6 months  3. BPH w/ history of urinary rentention S/p HoLEP Asymptomatic today. Stop Flomax.  PVR 84 mL.  IPSS 5/35, mild.   F/u up in 6 month with KUB and cystoscopy in 1 year.    [facility] [address] [place], [place] [address] [phone]  I, Selena Batten, am acting as a scribe for Dr. Hollice Espy.  I have reviewed the above documentation for accuracy and completeness, and I agree with the above.   Hollice Espy, MD  I spent 30 total minutes on the day of the encounter  including  pre-visit review of the medical record, face-to-face time with the patient, and post visit ordering of labs/imaging/tests.

## 2019-12-30 ENCOUNTER — Ambulatory Visit (INDEPENDENT_AMBULATORY_CARE_PROVIDER_SITE_OTHER): Payer: Medicare HMO | Admitting: Urology

## 2019-12-30 ENCOUNTER — Encounter: Payer: Self-pay | Admitting: Urology

## 2019-12-30 VITALS — BP 153/84 | HR 87

## 2019-12-30 DIAGNOSIS — Z87442 Personal history of urinary calculi: Secondary | ICD-10-CM

## 2019-12-30 DIAGNOSIS — N401 Enlarged prostate with lower urinary tract symptoms: Secondary | ICD-10-CM

## 2019-12-30 LAB — BLADDER SCAN AMB NON-IMAGING: Scan Result: 84

## 2019-12-31 ENCOUNTER — Ambulatory Visit: Payer: Self-pay | Admitting: Urology

## 2020-02-17 ENCOUNTER — Ambulatory Visit: Admit: 2020-02-17 | Discharge: 2020-02-18 | Payer: MEDICARE | Attending: Family | Primary: Family

## 2020-02-17 MED ORDER — HYDROCODONE 5 MG-ACETAMINOPHEN 325 MG TABLET
ORAL_TABLET | Freq: Four times a day (QID) | ORAL | 0 refills | 4.00000 days | Status: CP | PRN
Start: 2020-02-17 — End: ?

## 2020-04-02 ENCOUNTER — Ambulatory Visit: Admit: 2020-04-02 | Discharge: 2020-04-03 | Payer: MEDICARE | Attending: "Endocrinology | Primary: "Endocrinology

## 2020-04-02 DIAGNOSIS — E119 Type 2 diabetes mellitus without complications: Principal | ICD-10-CM

## 2020-04-02 DIAGNOSIS — Z794 Long term (current) use of insulin: Principal | ICD-10-CM

## 2020-04-02 DIAGNOSIS — E291 Testicular hypofunction: Principal | ICD-10-CM

## 2020-04-02 MED ORDER — TESTOSTERONE 1 % (25 MG/2.5 GRAM) TRANSDERMAL GEL PACKET
PACK | Freq: Every day | TRANSDERMAL | 0 refills | 90 days | Status: CP
Start: 2020-04-02 — End: ?

## 2020-04-19 ENCOUNTER — Other Ambulatory Visit: Payer: Self-pay | Admitting: Ophthalmology

## 2020-05-18 ENCOUNTER — Encounter: Payer: Self-pay | Admitting: Ophthalmology

## 2020-05-18 ENCOUNTER — Other Ambulatory Visit: Payer: Self-pay

## 2020-05-25 ENCOUNTER — Other Ambulatory Visit: Payer: Self-pay

## 2020-05-25 ENCOUNTER — Other Ambulatory Visit
Admission: RE | Admit: 2020-05-25 | Discharge: 2020-05-25 | Disposition: A | Discharge status: Home / Self Care | Payer: Medicare HMO | Source: Ambulatory Visit | Attending: Ophthalmology | Admitting: Ophthalmology

## 2020-05-25 ENCOUNTER — Ambulatory Visit: Admit: 2020-05-25 | Discharge: 2020-05-26 | Payer: MEDICARE | Attending: Family | Primary: Family

## 2020-05-25 DIAGNOSIS — E119 Type 2 diabetes mellitus without complications: Principal | ICD-10-CM

## 2020-05-25 DIAGNOSIS — Z20822 Contact with and (suspected) exposure to covid-19: Secondary | ICD-10-CM | POA: Insufficient documentation

## 2020-05-25 DIAGNOSIS — Z01812 Encounter for preprocedural laboratory examination: Secondary | ICD-10-CM | POA: Diagnosis present

## 2020-05-25 MED ORDER — POLYETHYLENE GLYCOL 3350 17 GRAM ORAL POWDER PACKET
PACK | Freq: Every day | ORAL | 0 refills | 10.00000 days | Status: CP
Start: 2020-05-25 — End: ?

## 2020-05-25 MED ORDER — ACETAMINOPHEN 500 MG TABLET
ORAL_TABLET | Freq: Four times a day (QID) | ORAL | 2 refills | 13.00000 days | Status: CP | PRN
Start: 2020-05-25 — End: 2020-05-25

## 2020-05-25 MED ORDER — ACETAMINOPHEN 500 MG TABLET: tablet | Freq: Four times a day (QID) | 2 refills | 13 days | Status: AC

## 2020-05-25 NOTE — Discharge Instructions (Signed)

## 2020-05-26 DIAGNOSIS — Z0000 Encounter for general adult medical examination without abnormal findings: Principal | ICD-10-CM

## 2020-05-26 LAB — SARS CORONAVIRUS 2 (TAT 6-24 HRS): SARS Coronavirus 2: NEGATIVE

## 2020-05-27 ENCOUNTER — Encounter: Payer: Self-pay | Admitting: Ophthalmology

## 2020-05-27 ENCOUNTER — Ambulatory Visit
Admission: RE | Admit: 2020-05-27 | Discharge: 2020-05-27 | Disposition: A | Discharge status: Home / Self Care | Payer: Medicare HMO | Attending: Ophthalmology | Admitting: Ophthalmology

## 2020-05-27 ENCOUNTER — Other Ambulatory Visit: Payer: Self-pay

## 2020-05-27 ENCOUNTER — Encounter
Admission: RE | Disposition: A | Discharge status: Home / Self Care | Payer: Self-pay | Source: Home / Self Care | Attending: Ophthalmology

## 2020-05-27 ENCOUNTER — Ambulatory Visit: Payer: Medicare HMO | Admitting: Anesthesiology

## 2020-05-27 DIAGNOSIS — Z79899 Other long term (current) drug therapy: Secondary | ICD-10-CM | POA: Insufficient documentation

## 2020-05-27 DIAGNOSIS — Z880 Allergy status to penicillin: Secondary | ICD-10-CM | POA: Insufficient documentation

## 2020-05-27 DIAGNOSIS — Z794 Long term (current) use of insulin: Secondary | ICD-10-CM | POA: Diagnosis not present

## 2020-05-27 DIAGNOSIS — Z881 Allergy status to other antibiotic agents status: Secondary | ICD-10-CM | POA: Insufficient documentation

## 2020-05-27 DIAGNOSIS — Z955 Presence of coronary angioplasty implant and graft: Secondary | ICD-10-CM | POA: Diagnosis not present

## 2020-05-27 DIAGNOSIS — E1136 Type 2 diabetes mellitus with diabetic cataract: Secondary | ICD-10-CM | POA: Insufficient documentation

## 2020-05-27 DIAGNOSIS — Z7951 Long term (current) use of inhaled steroids: Secondary | ICD-10-CM | POA: Insufficient documentation

## 2020-05-27 DIAGNOSIS — Z888 Allergy status to other drugs, medicaments and biological substances status: Secondary | ICD-10-CM | POA: Insufficient documentation

## 2020-05-27 DIAGNOSIS — Z87891 Personal history of nicotine dependence: Secondary | ICD-10-CM | POA: Diagnosis not present

## 2020-05-27 HISTORY — PX: CATARACT EXTRACTION W/PHACO: SHX586

## 2020-05-27 HISTORY — DX: Presence of dental prosthetic device (complete) (partial): Z97.2

## 2020-05-27 LAB — GLUCOSE, CAPILLARY: Glucose-Capillary: 144 mg/dL — ABNORMAL HIGH (ref 70–99)

## 2020-05-27 SURGERY — PHACOEMULSIFICATION, CATARACT, WITH IOL INSERTION
Anesthesia: Monitor Anesthesia Care | Site: Eye | Laterality: Left

## 2020-05-27 MED ORDER — LIDOCAINE HCL (PF) 2 % IJ SOLN
INTRAOCULAR | Status: DC | PRN
  Administered 2020-05-27: 1 mL via INTRAOCULAR

## 2020-05-27 MED ORDER — SODIUM HYALURONATE 23 MG/ML IO SOLN
INTRAOCULAR | Status: DC | PRN
  Administered 2020-05-27: 0.6 mL via INTRAOCULAR

## 2020-05-27 MED ORDER — ARMC OPHTHALMIC DILATING DROPS
1.0000 "application " | OPHTHALMIC | Status: DC | PRN
  Administered 2020-05-27 (×3): 1 via OPHTHALMIC

## 2020-05-27 MED ORDER — DEXMEDETOMIDINE HCL 200 MCG/2ML IV SOLN
INTRAVENOUS | Status: DC | PRN
  Administered 2020-05-27: 8 ug via INTRAVENOUS

## 2020-05-27 MED ORDER — LACTATED RINGERS IV SOLN: INTRAVENOUS | Status: DC

## 2020-05-27 MED ORDER — SODIUM HYALURONATE 10 MG/ML IO SOLN
INTRAOCULAR | Status: DC | PRN
  Administered 2020-05-27: 0.55 mL via INTRAOCULAR

## 2020-05-27 MED ORDER — TETRACAINE HCL 0.5 % OP SOLN
1.0000 [drp] | OPHTHALMIC | Status: DC | PRN
  Administered 2020-05-27 (×3): 1 [drp] via OPHTHALMIC

## 2020-05-27 MED ORDER — MOXIFLOXACIN HCL 0.5 % OP SOLN
OPHTHALMIC | Status: DC | PRN
  Administered 2020-05-27: 0.2 mL via OPHTHALMIC

## 2020-05-27 MED ORDER — EPINEPHRINE PF 1 MG/ML IJ SOLN
INTRAOCULAR | Status: DC | PRN
  Administered 2020-05-27: 66 mL via OPHTHALMIC

## 2020-05-27 MED ORDER — FENTANYL CITRATE (PF) 100 MCG/2ML IJ SOLN
INTRAMUSCULAR | Status: DC | PRN
  Administered 2020-05-27: 50 ug via INTRAVENOUS

## 2020-05-27 SURGICAL SUPPLY — 20 items
CANNULA ANT/CHMB 27G (MISCELLANEOUS) ×2 IMPLANT
CANNULA ANT/CHMB 27GA (MISCELLANEOUS) ×4 IMPLANT
DISSECTOR HYDRO NUCLEUS 50X22 (MISCELLANEOUS) ×2 IMPLANT
GLOVE SURG LX 7.5 STRW (GLOVE) ×2
GLOVE SURG LX STRL 7.5 STRW (GLOVE) ×1 IMPLANT
GLOVE SURG SYN 8.5  E (GLOVE) ×1
GLOVE SURG SYN 8.5 E (GLOVE) ×1 IMPLANT
GLOVE SURG SYN 8.5 PF PI (GLOVE) ×1 IMPLANT
GOWN STRL REUS W/ TWL LRG LVL3 (GOWN DISPOSABLE) ×2 IMPLANT
GOWN STRL REUS W/TWL LRG LVL3 (GOWN DISPOSABLE) ×2
LENS IOL IQ PAN TRC 40 19.5 IMPLANT
LENS IOL PANOPTIX TORIC 19.5 ×2 IMPLANT
MARKER SKIN DUAL TIP RULER LAB (MISCELLANEOUS) ×2 IMPLANT
PACK DR. KING ARMS (PACKS) ×2 IMPLANT
PACK EYE AFTER SURG (MISCELLANEOUS) ×2 IMPLANT
PACK OPTHALMIC (MISCELLANEOUS) ×2 IMPLANT
SYR 3ML LL SCALE MARK (SYRINGE) ×2 IMPLANT
SYR TB 1ML LUER SLIP (SYRINGE) ×2 IMPLANT
WATER STERILE IRR 250ML POUR (IV SOLUTION) ×2 IMPLANT
WIPE NON LINTING 3.25X3.25 (MISCELLANEOUS) ×2 IMPLANT

## 2020-05-27 NOTE — Anesthesia Postprocedure Evaluation (Signed)
Anesthesia Post Note  Patient: Dustin Blackburn  Procedure(s) Performed: CATARACT EXTRACTION PHACO AND INTRAOCULAR LENS PLACEMENT (IOC) LEFT DIABETIC (Left Eye)     Patient location during evaluation: PACU Anesthesia Type: MAC Level of consciousness: awake and alert Pain management: pain level controlled Vital Signs Assessment: post-procedure vital signs reviewed and stable Respiratory status: spontaneous breathing Cardiovascular status: stable Anesthetic complications: no   No complications documented.  Gillian Scarce

## 2020-05-27 NOTE — Anesthesia Preprocedure Evaluation (Signed)
Anesthesia Evaluation  Patient identified by MRN, date of birth, ID band Patient awake    Reviewed: Allergy & Precautions, H&P , NPO status , Patient's Chart, lab work & pertinent test results  Airway Mallampati: II  TM Distance: >3 FB Neck ROM: full    Dental no notable dental hx.    Pulmonary asthma , COPD, former smoker,    Pulmonary exam normal        Cardiovascular + CAD and + Past MI  Normal cardiovascular exam Rhythm:regular Rate:Normal     Neuro/Psych Depression    GI/Hepatic Medicated,  Endo/Other  diabetes, Well Controlled, Type 2, Insulin Dependent  Renal/GU CRF     Musculoskeletal   Abdominal   Peds  Hematology   Anesthesia Other Findings   Reproductive/Obstetrics                             Anesthesia Physical Anesthesia Plan  ASA: III  Anesthesia Plan: MAC   Post-op Pain Management:    Induction:   PONV Risk Score and Plan: 1 and Treatment may vary due to age or medical condition  Airway Management Planned:   Additional Equipment:   Intra-op Plan:   Post-operative Plan:   Informed Consent: I have reviewed the patients History and Physical, chart, labs and discussed the procedure including the risks, benefits and alternatives for the proposed anesthesia with the patient or authorized representative who has indicated his/her understanding and acceptance.       Plan Discussed with:   Anesthesia Plan Comments:         Anesthesia Quick Evaluation

## 2020-05-27 NOTE — Op Note (Signed)
OPERATIVE NOTE  Dustin Blackburn [id number] 05/27/2020   PREOPERATIVE DIAGNOSIS:  Nuclear sclerotic cataract left eye.  H25.12   POSTOPERATIVE DIAGNOSIS:    Nuclear sclerotic cataract left eye.     PROCEDURE:  Phacoemusification with posterior chamber intraocular lens placement of the left eye   LENS:   Implant Name Type Inv. Item Serial No. Manufacturer Lot No. LRB No. Used Action  LENS IOL PANOPTIX TORIC 19.5 - [id number]  LENS IOL PANOPTIX TORIC 19.5 [id number] ALCON  Left 1 Implanted      Procedure(s) with comments: CATARACT EXTRACTION PHACO AND INTRAOCULAR LENS PLACEMENT (IOC) LEFT DIABETIC (Left) - 3.73 0:38.1  [id number] +19.5   ULTRASOUND TIME: 0 minutes 38 seconds.  CDE 3.73   SURGEON:  Willey Blade, MD, MPH   ANESTHESIA:  Topical with tetracaine drops augmented with 1% preservative-free intracameral lidocaine.  ESTIMATED BLOOD LOSS: <1 mL   COMPLICATIONS:  None.   DESCRIPTION OF PROCEDURE:  The patient was identified in the holding room and transported to the operating room and placed in the supine position under the operating microscope.  The left eye was identified as the operative eye and it was prepped and draped in the usual sterile ophthalmic fashion.  The Verion system was registered without difficulty.   A 1.0 millimeter clear-corneal paracentesis was made at the 5:00 position. 0.5 ml of preservative-free 1% lidocaine with epinephrine was injected into the anterior chamber.  The anterior chamber was filled with Healon 5 viscoelastic.  A 2.4 millimeter keratome was used to make a near-clear corneal incision at the 2:00 position.  A curvilinear capsulorrhexis was made with a cystotome and capsulorrhexis forceps.  Balanced salt solution was used to hydrodissect and hydrodelineate the nucleus.   Phacoemulsification was then used in stop and chop fashion to remove the lens nucleus and epinucleus.  The remaining cortex was then removed using the irrigation and  aspiration handpiece. Healon was then placed into the capsular bag to distend it for lens placement.  A lens was then injected into the capsular bag.  The remaining viscoelastic was aspirated.   Wounds were hydrated with balanced salt solution.  The anterior chamber was inflated to a physiologic pressure with balanced salt solution.  The lens was rotated without difficulty using guidance from the verion system.  Intracameral vigamox 0.1 mL undiltued was injected into the eye and a drop placed onto the ocular surface.  No wound leaks were noted.  The patient was taken to the recovery room in stable condition without complications of anesthesia or surgery  Willey Blade 05/27/2020, 9:29 AM

## 2020-05-27 NOTE — Anesthesia Procedure Notes (Signed)
Procedure Name: MAC Date/Time: 05/27/2020 9:08 AM Performed by: Cameron Ali, CRNA Pre-anesthesia Checklist: Patient identified, Emergency Drugs available, Suction available, Timeout performed and Patient being monitored Patient Re-evaluated:Patient Re-evaluated prior to induction Oxygen Delivery Method: Nasal cannula Placement Confirmation: positive ETCO2

## 2020-05-27 NOTE — Transfer of Care (Signed)
Immediate Anesthesia Transfer of Care Note  Patient: Dustin Blackburn  Procedure(s) Performed: CATARACT EXTRACTION PHACO AND INTRAOCULAR LENS PLACEMENT (IOC) LEFT DIABETIC (Left Eye)  Patient Location: PACU  Anesthesia Type: MAC  Level of Consciousness: awake, alert  and patient cooperative  Airway and Oxygen Therapy: Patient Spontanous Breathing and Patient connected to supplemental oxygen  Post-op Assessment: Post-op Vital signs reviewed, Patient's Cardiovascular Status Stable, Respiratory Function Stable, Patent Airway and No signs of Nausea or vomiting  Post-op Vital Signs: Reviewed and stable  Complications: No complications documented.

## 2020-05-27 NOTE — H&P (Signed)
[facility]   Primary Care Physician:  Romualdo Bolk, FNP Ophthalmologist: Dr. Benay Pillow  Pre-Procedure History & Physical: HPI:  Dustin Blackburn is a 79 y.o. male here for cataract surgery.   Past Medical History:  Diagnosis Date  . Arthritis   . Asthma   . 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 Cataracts, bilateral   . Cold   . COPD (chronic obstructive pulmonary disease) ([place])   . Coronary artery disease   . DDD (degenerative disc disease), cervical   . Depression   . Diabetes mellitus without complication ([place])   . Dyspnea   . GERD (gastroesophageal reflux disease)   . Hearing loss associated with syndrome of both ears   . Heart attack ([place]) 2016   stented x 1  . History of aspiration pneumonitis   . History of kidney stones   . Hyperlipidemia   . Kidney cysts   . Kidney stones   . Osteoarthritis of back   . Psoriasis   . Psoriasis   . Seasonal allergies   . Wears dentures    full upper    Past Surgical History:  Procedure Laterality Date  . CARDIAC CATHETERIZATION  2016 and 2018  . COLONOSCOPY    . COLONOSCOPY WITH PROPOFOL N/A 09/18/2017   Procedure: COLONOSCOPY WITH PROPOFOL;  Surgeon: Lollie Sails, MD;  Location: [facility] ENDOSCOPY;  Service: Endoscopy;  Laterality: N/A;  . CORONARY ANGIOPLASTY  2016  . CORONARY STENT PLACEMENT  2016  . CYSTOSCOPY W/ RETROGRADES Bilateral 06/12/2016   Procedure: CYSTOSCOPY WITH RETROGRADE PYELOGRAM;  Surgeon: Hollice Espy, MD;  Location: [facility] ORS;  Service: Urology;  Laterality: Bilateral;  . CYSTOSCOPY W/ RETROGRADES Bilateral 10/02/2017   Procedure: CYSTOSCOPY WITH RETROGRADE PYELOGRAM;  Surgeon: Hollice Espy, MD;  Location: [facility] ORS;  Service: Urology;  Laterality: Bilateral;  . CYSTOSCOPY WITH BIOPSY N/A 10/02/2017   Procedure: CYSTOSCOPY WITH Bladder BIOPSY;  Surgeon: Hollice Espy, MD;  Location: [facility] ORS;  Service: Urology;  Laterality: N/A;  . CYSTOSCOPY WITH BIOPSY N/A 11/10/2019   Procedure: CYSTOSCOPY  WITH Bladder BIOPSY;  Surgeon: Hollice Espy, MD;  Location: [facility] ORS;  Service: Urology;  Laterality: N/A;  . CYSTOSCOPY WITH INSERTION OF UROLIFT N/A 11/10/2019   Procedure: CYSTOSCOPY WITH INSERTION OF UROLIFT;  Surgeon: Hollice Espy, MD;  Location: [facility] ORS;  Service: Urology;  Laterality: N/A;  . CYSTOSCOPY WITH STENT PLACEMENT Right 11/10/2019   Procedure: CYSTOSCOPY WITH STENT PLACEMENT;  Surgeon: Hollice Espy, MD;  Location: [facility] ORS;  Service: Urology;  Laterality: Right;  . CYSTOSCOPY WITH URETEROSCOPY Right 10/02/2017   Procedure: DIAGNOSTIC RIGHT URETEROSCOPY;  Surgeon: Hollice Espy, MD;  Location: [facility] ORS;  Service: Urology;  Laterality: Right;  . ESOPHAGOGASTRODUODENOSCOPY (EGD) WITH PROPOFOL N/A 09/18/2017   Procedure: ESOPHAGOGASTRODUODENOSCOPY (EGD) WITH PROPOFOL;  Surgeon: Lollie Sails, MD;  Location: [facility] ENDOSCOPY;  Service: Endoscopy;  Laterality: N/A;  . fatty tumors removed    . HIATAL HERNIA REPAIR  1992  . LIPOMA EXCISION     right leg  . NASOPHARYNGOSCOPY EUSTATION TUBE BALLOON DILATION Bilateral 06/13/2018   Procedure: NASOPHARYNGOSCOPY EUSTATION TUBE BALLOON DILATION DIABETIC, NEED EUSTATION TUBE BALLOON SYSTEM;  Surgeon: Margaretha Sheffield, MD;  Location: [facility];  Service: ENT;  Laterality: Bilateral;  . RIGHT/LEFT HEART CATH AND CORONARY ANGIOGRAPHY N/A 12/21/2016   Procedure: Right/Left Heart Cath and Coronary Angiography;  Surgeon: Isaias Cowman, MD;  Location: [facility] CV LAB;  Service: Cardiovascular;  Laterality: N/A;  . SINUS EXPLORATION  1992  . TRANSURETHRAL RESECTION OF BLADDER TUMOR WITH MITOMYCIN-C N/A 06/12/2016   Procedure: TRANSURETHRAL RESECTION OF BLADDER TUMOR WITH MITOMYCIN-C;  Surgeon: Vanna Scotland, MD;  Location: [facility] ORS;  Service: Urology;  Laterality: N/A;  . TURBINATE REDUCTION Bilateral 06/13/2018   Procedure: Salvatore Decent;  Surgeon: Vernie Murders, MD;  Location: [facility];  Service: ENT;   Laterality: Bilateral;  Diabetic - insulin  . UMBILICAL HERNIA REPAIR N/A 06/12/2016   Procedure: HERNIA REPAIR UMBILICAL ADULT;  Surgeon: Kieth Brightly, MD;  Location: [facility] ORS;  Service: General;  Laterality: N/A;  . URETEROSCOPY Right 06/12/2016   Procedure: URETEROSCOPY;  Surgeon: Vanna Scotland, MD;  Location: [facility] ORS;  Service: Urology;  Laterality: Right;  . URETEROSCOPY WITH HOLMIUM LASER LITHOTRIPSY Right 11/10/2019   Procedure: URETEROSCOPY WITH HOLMIUM LASER LITHOTRIPSY;  Surgeon: Vanna Scotland, MD;  Location: [facility] ORS;  Service: Urology;  Laterality: Right;    Prior to Admission medications   Medication Sig Start Date End Date Taking? Authorizing Provider  albuterol (PROVENTIL HFA;VENTOLIN HFA) 108 (90 Base) MCG/ACT inhaler Inhale 2 puffs into the lungs every 6 (six) hours as needed for wheezing or shortness of breath.    Yes [provider]  clobetasol cream (TEMOVATE) 0.05 % Apply 1 application topically 2 (two) times daily as needed (psoriasis).  09/15/19  Yes [provider]  DICLOFENAC PO Take by mouth as needed.   Yes [provider]  fluocinonide cream (LIDEX) 0.05 % Apply 1 application topically as needed.   Yes [provider]  fluticasone (FLONASE) 50 MCG/ACT nasal spray Place into both nostrils daily as needed for allergies or rhinitis.   Yes [provider]  hydrocortisone (CORTEF) 10 MG tablet Take 10 mg by mouth daily. Was ordered by dr. Otelia Santee ([place]) 05/20/19  Yes [provider]  insulin NPH-regular Human (70-30) 100 UNIT/ML injection Inject 12 Units into the skin 2 (two) times daily with a meal.   Yes [provider]  SYMBICORT 160-4.5 MCG/ACT inhaler Inhale 2 puffs into the lungs 2 (two) times daily as needed for shortness of breath. 09/16/19  Yes [provider]  testosterone (ANDROGEL) 50 MG/5GM (1%) GEL Place 5 g onto the skin daily as needed.   Yes [provider]   RELION PEN NEEDLES 31G X 6 MM MISC  10/08/17   [provider]    Allergies as of 04/02/2020 - Review Complete 12/30/2019  Allergen Reaction Noted  . Actos [pioglitazone] Palpitations 12/19/2016  . Glimepiride Rash 02/22/2015  . Glipizide Shortness Of Breath 09/17/2017  . Metformin Shortness Of Breath 02/22/2015  . Amoxicillin Nausea Only 02/22/2015  . Erythromycin base Nausea Only 02/22/2015  . Esomeprazole magnesium Other (See Comments) 02/22/2015  . Lipitor [atorvastatin]  09/17/2017  . Liraglutide Other (See Comments) 02/22/2015  . Penicillins Nausea Only 02/22/2015  . Sitagliptin Other (See Comments) 02/22/2015    Family History  Problem Relation Age of Onset  . Cancer Mother   . Prostate cancer Maternal Uncle   . Prostate cancer Paternal Uncle   . Prostate cancer Cousin   . Congestive Heart Failure Father   . Kidney cancer Neg Hx   . Bladder Cancer Neg Hx     Social History   Socioeconomic History  . Marital status: Married    Spouse name: BETTY  . Number of children: Not on file  . Years of education: Not on file  . Highest education level: Not on file  Occupational History  . Occupation: was  a Clinical biochemist    Comment: retired  Tobacco Use  . Smoking status: Former Smoker    Years: 30.00    Types: Cigarettes    Quit date: 05/29/1990    Years since quitting: 30.0  . Smokeless tobacco: Former Systems developer    Types: Chew    Quit date: 1992  Media planner  . Vaping Use: Never used  Substance and Sexual Activity  . Alcohol use: Yes    Comment: rarely  . Drug use: No  . Sexual activity: Not Currently  Other Topics Concern  . Not on file  Social History Narrative   Patient lives with wife.    Social Determinants of Health   Financial Resource Strain: Not on file  Food Insecurity: Not on file  Transportation Needs: Not on file  Physical Activity: Not on file  Stress: Not on file  Social Connections: Not on file  Intimate Partner Violence: Not on  file    Review of Systems: See HPI, otherwise negative ROS  Physical Exam: BP (!) 149/86   Pulse 77   Temp 97.6 F (36.4 C) (Temporal)   Resp 16   Ht 5\' 9"  (1.753 m)   Wt 98 kg   SpO2 94%   BMI 31.90 kg/m  General:   Alert,  pleasant and cooperative in NAD Head:  Normocephalic and atraumatic. Respiratory:  Normal work of breathing.  Impression/Plan: Dustin Blackburn is here for cataract surgery.  Risks, benefits, limitations, and alternatives regarding cataract surgery have been reviewed with the patient.  Questions have been answered.  All parties agreeable.   Benay Pillow, MD  05/27/2020, 8:58 AM

## 2020-05-31 DIAGNOSIS — N3090 Cystitis, unspecified without hematuria: Principal | ICD-10-CM

## 2020-05-31 DIAGNOSIS — L409 Psoriasis, unspecified: Principal | ICD-10-CM

## 2020-05-31 MED ORDER — CLOBETASOL 0.05 % TOPICAL CREAM
Freq: Two times a day (BID) | TOPICAL | 0 refills | 0 days | Status: CP
Start: 2020-05-31 — End: ?

## 2020-05-31 MED ORDER — FLUOCINONIDE 0.05 % TOPICAL SOLUTION
Freq: Two times a day (BID) | TOPICAL | 1 refills | 0 days | Status: CP
Start: 2020-05-31 — End: ?

## 2020-06-07 ENCOUNTER — Encounter: Payer: Self-pay | Admitting: Anesthesiology

## 2020-06-07 ENCOUNTER — Encounter: Payer: Self-pay | Admitting: Ophthalmology

## 2020-06-07 ENCOUNTER — Other Ambulatory Visit: Payer: Self-pay

## 2020-06-10 ENCOUNTER — Other Ambulatory Visit
Admission: RE | Admit: 2020-06-10 | Discharge: 2020-06-10 | Disposition: A | Discharge status: Home / Self Care | Payer: Medicare HMO | Source: Ambulatory Visit | Attending: Ophthalmology | Admitting: Ophthalmology

## 2020-06-10 ENCOUNTER — Other Ambulatory Visit: Payer: Self-pay

## 2020-06-10 DIAGNOSIS — Z20822 Contact with and (suspected) exposure to covid-19: Secondary | ICD-10-CM | POA: Insufficient documentation

## 2020-06-10 DIAGNOSIS — Z01812 Encounter for preprocedural laboratory examination: Secondary | ICD-10-CM | POA: Insufficient documentation

## 2020-06-10 LAB — SARS CORONAVIRUS 2 (TAT 6-24 HRS): SARS Coronavirus 2: NEGATIVE

## 2020-06-10 NOTE — Discharge Instructions (Signed)

## 2020-06-17 ENCOUNTER — Other Ambulatory Visit: Payer: Self-pay

## 2020-06-17 ENCOUNTER — Other Ambulatory Visit
Admission: RE | Admit: 2020-06-17 | Discharge: 2020-06-17 | Disposition: A | Discharge status: Home / Self Care | Payer: Medicare HMO | Source: Ambulatory Visit | Attending: Ophthalmology | Admitting: Ophthalmology

## 2020-06-17 DIAGNOSIS — Z01812 Encounter for preprocedural laboratory examination: Secondary | ICD-10-CM | POA: Insufficient documentation

## 2020-06-17 DIAGNOSIS — Z20822 Contact with and (suspected) exposure to covid-19: Secondary | ICD-10-CM | POA: Insufficient documentation

## 2020-06-17 LAB — SARS CORONAVIRUS 2 (TAT 6-24 HRS): SARS Coronavirus 2: NEGATIVE

## 2020-06-21 ENCOUNTER — Encounter: Payer: Self-pay | Admitting: Ophthalmology

## 2020-06-21 ENCOUNTER — Other Ambulatory Visit: Payer: Self-pay

## 2020-06-21 ENCOUNTER — Encounter: Payer: Self-pay | Admitting: Anesthesiology

## 2020-06-21 ENCOUNTER — Ambulatory Visit
Admission: RE | Admit: 2020-06-21 | Discharge: 2020-06-21 | Disposition: A | Discharge status: Home / Self Care | Payer: Medicare HMO | Attending: Ophthalmology | Admitting: Ophthalmology

## 2020-06-21 ENCOUNTER — Encounter
Admission: RE | Disposition: A | Discharge status: Home / Self Care | Payer: Self-pay | Source: Home / Self Care | Attending: Ophthalmology

## 2020-06-21 DIAGNOSIS — Z87891 Personal history of nicotine dependence: Secondary | ICD-10-CM | POA: Insufficient documentation

## 2020-06-21 DIAGNOSIS — Z880 Allergy status to penicillin: Secondary | ICD-10-CM | POA: Diagnosis not present

## 2020-06-21 DIAGNOSIS — Z881 Allergy status to other antibiotic agents status: Secondary | ICD-10-CM | POA: Insufficient documentation

## 2020-06-21 DIAGNOSIS — H2511 Age-related nuclear cataract, right eye: Secondary | ICD-10-CM | POA: Diagnosis not present

## 2020-06-21 DIAGNOSIS — Z888 Allergy status to other drugs, medicaments and biological substances status: Secondary | ICD-10-CM | POA: Insufficient documentation

## 2020-06-21 DIAGNOSIS — E1136 Type 2 diabetes mellitus with diabetic cataract: Secondary | ICD-10-CM | POA: Diagnosis not present

## 2020-06-21 DIAGNOSIS — Z7951 Long term (current) use of inhaled steroids: Secondary | ICD-10-CM | POA: Diagnosis not present

## 2020-06-21 DIAGNOSIS — Z794 Long term (current) use of insulin: Secondary | ICD-10-CM | POA: Insufficient documentation

## 2020-06-21 DIAGNOSIS — Z79899 Other long term (current) drug therapy: Secondary | ICD-10-CM | POA: Insufficient documentation

## 2020-06-21 HISTORY — PX: CATARACT EXTRACTION W/PHACO: SHX586

## 2020-06-21 LAB — GLUCOSE, CAPILLARY
Glucose-Capillary: 148 mg/dL — ABNORMAL HIGH (ref 70–99)
Glucose-Capillary: 143 mg/dL — ABNORMAL HIGH (ref 70–99)

## 2020-06-21 SURGERY — PHACOEMULSIFICATION, CATARACT, WITH IOL INSERTION
Anesthesia: Monitor Anesthesia Care | Site: Eye | Laterality: Right

## 2020-06-21 MED ORDER — FENTANYL CITRATE (PF) 100 MCG/2ML IJ SOLN
INTRAMUSCULAR | Status: DC | PRN
  Administered 2020-06-21: 50 ug via INTRAVENOUS

## 2020-06-21 MED ORDER — EPINEPHRINE PF 1 MG/ML IJ SOLN
INTRAOCULAR | Status: DC | PRN
  Administered 2020-06-21: 78 mL via OPHTHALMIC

## 2020-06-21 MED ORDER — ACETAMINOPHEN 325 MG PO TABS: 325.0000 mg | ORAL_TABLET | Freq: Once | ORAL | Status: DC

## 2020-06-21 MED ORDER — ACETAMINOPHEN 160 MG/5ML PO SOLN: 325.0000 mg | Freq: Once | ORAL | Status: DC

## 2020-06-21 MED ORDER — LIDOCAINE HCL (PF) 2 % IJ SOLN
INTRAOCULAR | Status: DC | PRN
  Administered 2020-06-21: 1 mL via INTRAOCULAR

## 2020-06-21 MED ORDER — ARMC OPHTHALMIC DILATING DROPS
1.0000 "application " | OPHTHALMIC | Status: DC | PRN
  Administered 2020-06-21 (×3): 1 via OPHTHALMIC

## 2020-06-21 MED ORDER — TETRACAINE HCL 0.5 % OP SOLN
1.0000 [drp] | OPHTHALMIC | Status: DC | PRN
  Administered 2020-06-21 (×3): 1 [drp] via OPHTHALMIC

## 2020-06-21 MED ORDER — DEXMEDETOMIDINE HCL 200 MCG/2ML IV SOLN
INTRAVENOUS | Status: DC | PRN
  Administered 2020-06-21: 8 ug via INTRAVENOUS

## 2020-06-21 MED ORDER — SODIUM HYALURONATE 23 MG/ML IO SOLN
INTRAOCULAR | Status: DC | PRN
  Administered 2020-06-21: 0.6 mL via INTRAOCULAR

## 2020-06-21 MED ORDER — LACTATED RINGERS IV SOLN: INTRAVENOUS | Status: DC

## 2020-06-21 MED ORDER — MOXIFLOXACIN HCL 0.5 % OP SOLN
OPHTHALMIC | Status: DC | PRN
  Administered 2020-06-21: 0.2 mL via OPHTHALMIC

## 2020-06-21 MED ORDER — SODIUM HYALURONATE 10 MG/ML IO SOLN
INTRAOCULAR | Status: DC | PRN
  Administered 2020-06-21: 0.55 mL via INTRAOCULAR

## 2020-06-21 SURGICAL SUPPLY — 19 items
CANNULA ANT/CHMB 27GA (MISCELLANEOUS) ×4 IMPLANT
DISSECTOR HYDRO NUCLEUS 50X22 (MISCELLANEOUS) ×2 IMPLANT
GLOVE SURG LX 7.5 STRW (GLOVE) ×2
GLOVE SURG LX STRL 7.5 STRW (GLOVE) ×2 IMPLANT
GLOVE SURG SYN 8.5  E (GLOVE) ×1
GLOVE SURG SYN 8.5 E (GLOVE) ×1 IMPLANT
GOWN STRL REUS W/ TWL LRG LVL3 (GOWN DISPOSABLE) ×2 IMPLANT
GOWN STRL REUS W/TWL LRG LVL3 (GOWN DISPOSABLE) ×4
LENS IOL IQ PAN TRC 30 19.0 ×1 IMPLANT
LENS IOL PANOP TORIC 30 19.0 ×1 IMPLANT
LENS IOL PANOPTIX TORIC 19.0 ×2 IMPLANT
MARKER SKIN DUAL TIP RULER LAB (MISCELLANEOUS) ×2 IMPLANT
PACK DR. KING ARMS (PACKS) ×2 IMPLANT
PACK EYE AFTER SURG (MISCELLANEOUS) ×2 IMPLANT
PACK OPTHALMIC (MISCELLANEOUS) ×2 IMPLANT
SYR 3ML LL SCALE MARK (SYRINGE) ×2 IMPLANT
SYR TB 1ML LUER SLIP (SYRINGE) ×2 IMPLANT
WATER STERILE IRR 250ML POUR (IV SOLUTION) ×2 IMPLANT
WIPE NON LINTING 3.25X3.25 (MISCELLANEOUS) ×2 IMPLANT

## 2020-06-21 NOTE — Anesthesia Procedure Notes (Signed)
Procedure Name: MAC Date/Time: 06/21/2020 8:28 AM Performed by: Cameron Ali, CRNA Pre-anesthesia Checklist: Patient identified, Emergency Drugs available, Suction available, Timeout performed and Patient being monitored Patient Re-evaluated:Patient Re-evaluated prior to induction Oxygen Delivery Method: Nasal cannula Placement Confirmation: positive ETCO2

## 2020-06-21 NOTE — Op Note (Signed)
OPERATIVE NOTE  Dustin Blackburn [id number] 06/21/2020   PREOPERATIVE DIAGNOSIS:  Nuclear sclerotic cataract right eye.  H25.11   POSTOPERATIVE DIAGNOSIS:    Nuclear sclerotic cataract right eye.     PROCEDURE:  Phacoemusification with posterior chamber intraocular lens placement of the right eye   LENS:   Implant Name Type Inv. Item Serial No. Manufacturer Lot No. LRB No. Used Action  LENS IOL PANOPTIX TORIC 19.0 - [id number]  LENS IOL PANOPTIX TORIC 19.0 [id number] ALCON  Right 1 Implanted       Procedure(s) with comments: CATARACT EXTRACTION PHACO AND INTRAOCULAR LENS PLACEMENT (IOC) RIGHT DIABETIC (Right) - 3.20 0:30.5  TFTN30 +19.0   ULTRASOUND TIME: 0 minutes 30 seconds.  CDE 3.20   SURGEON:  Benay Pillow, MD, MPH  ANESTHESIOLOGIST: Anesthesiologist: Ronelle Nigh, MD CRNA: Cameron Ali, CRNA   ANESTHESIA:  Topical with tetracaine drops augmented with 1% preservative-free intracameral lidocaine.  ESTIMATED BLOOD LOSS: less than 1 mL.   COMPLICATIONS:  None.   DESCRIPTION OF PROCEDURE:  The patient was identified in the holding room and transported to the operating room and placed in the supine position under the operating microscope.  The right eye was identified as the operative eye and it was prepped and draped in the usual sterile ophthalmic fashion.  The verion system was registered without difficulty.   A 1.0 millimeter clear-corneal paracentesis was made at the 10:30 position. 0.5 ml of preservative-free 1% lidocaine with epinephrine was injected into the anterior chamber.  The anterior chamber was filled with Healon 5 viscoelastic.  A 2.4 millimeter keratome was used to make a near-clear corneal incision at the 8:00 position.  A curvilinear capsulorrhexis was made with a cystotome and capsulorrhexis forceps.  Balanced salt solution was used to hydrodissect and hydrodelineate the nucleus.   Phacoemulsification was then used in stop and chop fashion to remove  the lens nucleus and epinucleus.  The remaining cortex was then removed using the irrigation and aspiration handpiece. Healon was then placed into the capsular bag to distend it for lens placement.  A lens was then injected into the capsular bag.  The remaining viscoelastic was aspirated.  The lens was rotated with guidance from the Manlius system.   Wounds were hydrated with balanced salt solution.  The anterior chamber was inflated to a physiologic pressure with balanced salt solution. The lens was well centered.  Intracameral vigamox 0.1 mL undiluted was injected into the eye and a drop placed onto the ocular surface.  No wound leaks were noted.  The patient was taken to the recovery room in stable condition without complications of anesthesia or surgery  Benay Pillow 06/21/2020, 8:54 AM

## 2020-06-21 NOTE — Transfer of Care (Signed)
Immediate Anesthesia Transfer of Care Note  Patient: Dustin Blackburn  Procedure(s) Performed: CATARACT EXTRACTION PHACO AND INTRAOCULAR LENS PLACEMENT (IOC) RIGHT DIABETIC (Right Eye)  Patient Location: PACU  Anesthesia Type: MAC  Level of Consciousness: awake, alert  and patient cooperative  Airway and Oxygen Therapy: Patient Spontanous Breathing and Patient connected to supplemental oxygen  Post-op Assessment: Post-op Vital signs reviewed, Patient's Cardiovascular Status Stable, Respiratory Function Stable, Patent Airway and No signs of Nausea or vomiting  Post-op Vital Signs: Reviewed and stable  Complications: No complications documented.

## 2020-06-21 NOTE — H&P (Signed)

## 2020-06-21 NOTE — Anesthesia Preprocedure Evaluation (Signed)
Anesthesia Evaluation  Patient identified by MRN, date of birth, ID band Patient awake    Reviewed: Allergy & Precautions, H&P , NPO status , Patient's Chart, lab work & pertinent test results  Airway Mallampati: III  TM Distance: >3 FB Neck ROM: full    Dental  (+) Upper Dentures   Pulmonary asthma , COPD, former smoker,    Pulmonary exam normal breath sounds clear to auscultation       Cardiovascular + CAD and + Past MI  Normal cardiovascular exam Rhythm:regular Rate:Normal     Neuro/Psych Depression  Neuromuscular disease    GI/Hepatic   Endo/Other  diabetes, Well Controlled, Type 2, Insulin Dependent  Renal/GU      Musculoskeletal   Abdominal   Peds  Hematology   Anesthesia Other Findings   Reproductive/Obstetrics                             Anesthesia Physical  Anesthesia Plan  ASA: III  Anesthesia Plan: MAC   Post-op Pain Management:    Induction:   PONV Risk Score and Plan: 1 and Treatment may vary due to age or medical condition  Airway Management Planned:   Additional Equipment:   Intra-op Plan:   Post-operative Plan:   Informed Consent: I have reviewed the patients History and Physical, chart, labs and discussed the procedure including the risks, benefits and alternatives for the proposed anesthesia with the patient or authorized representative who has indicated his/her understanding and acceptance.     Dental Advisory Given  Plan Discussed with: CRNA  Anesthesia Plan Comments:         Anesthesia Quick Evaluation

## 2020-06-21 NOTE — Anesthesia Postprocedure Evaluation (Signed)
Anesthesia Post Note  Patient: Dustin Blackburn  Procedure(s) Performed: CATARACT EXTRACTION PHACO AND INTRAOCULAR LENS PLACEMENT (IOC) RIGHT DIABETIC (Right Eye)     Patient location during evaluation: PACU Anesthesia Type: MAC Level of consciousness: awake and alert and oriented Pain management: satisfactory to patient Vital Signs Assessment: post-procedure vital signs reviewed and stable Respiratory status: spontaneous breathing, nonlabored ventilation and respiratory function stable Cardiovascular status: blood pressure returned to baseline and stable Postop Assessment: Adequate PO intake and No signs of nausea or vomiting Anesthetic complications: no   No complications documented.  Raliegh Ip

## 2020-06-22 ENCOUNTER — Encounter: Payer: Self-pay | Admitting: Ophthalmology

## 2020-07-06 ENCOUNTER — Ambulatory Visit: Payer: Medicare HMO | Admitting: Urology

## 2020-07-06 ENCOUNTER — Ambulatory Visit
Admission: RE | Admit: 2020-07-06 | Discharge: 2020-07-06 | Disposition: A | Discharge status: Home / Self Care | Payer: Medicare HMO | Source: Ambulatory Visit | Attending: Urology | Admitting: Urology

## 2020-07-06 ENCOUNTER — Other Ambulatory Visit: Payer: Self-pay

## 2020-07-06 VITALS — BP 171/99 | HR 88 | Wt 222.0 lb

## 2020-07-06 DIAGNOSIS — Z87442 Personal history of urinary calculi: Secondary | ICD-10-CM

## 2020-07-06 DIAGNOSIS — Z8551 Personal history of malignant neoplasm of bladder: Secondary | ICD-10-CM

## 2020-07-06 DIAGNOSIS — R350 Frequency of micturition: Secondary | ICD-10-CM | POA: Diagnosis not present

## 2020-07-06 DIAGNOSIS — N400 Enlarged prostate without lower urinary tract symptoms: Secondary | ICD-10-CM

## 2020-07-06 DIAGNOSIS — R1011 Right upper quadrant pain: Secondary | ICD-10-CM

## 2020-07-06 DIAGNOSIS — R109 Unspecified abdominal pain: Secondary | ICD-10-CM | POA: Diagnosis not present

## 2020-07-06 LAB — BLADDER SCAN AMB NON-IMAGING: Scan Result: 0

## 2020-07-06 NOTE — Progress Notes (Signed)
07/06/2020 10:55 AM   Dustin Blackburn [birth year] [id number]  Referring provider: Romualdo Bolk, FNP [address] [place],  [place] [address]  Chief Complaint  Patient presents with   Nephrolithiasis    HPI: 80 year old male with remote history of bladder cancer, kidney stones and BPH with history of urinary retention who returns today for 46-month follow-up.  Most recently, he was taken to the operating room in 10/2019 for treatment of a right-sided kidney stone as well as UroLift.  He also had some erythematous bladder lesions biopsied.  Postoperatively, he did well and was able to stop his Flomax.  He is due for cystoscopy in 11/2020.  S/p TURBT bilateral retrograde, and instillation ofmitomycin 01/15/201.  Surgical pathology was consistent with low-grade, noninvasive tumor. No recurrence.    KUB today shows no significant recurrence of his stone burden.  Stone composition was 80% uric acid, 20% calcium oxalate monohydrate.  Reviewed stone diet recommendations at last visit but ultimately declined metabolic evaluation.  He does mention today that he is new onset severe intermittent right flank pain again.  Is been several weeks.  He is worried he may have a urinary tract infection.  He denies any burning or gross hematuria.   IPSS    Row Name 07/06/20 1500         International Prostate Symptom Score   How often have you had the sensation of not emptying your bladder? Not at All     How often have you had to urinate less than every two hours? Less than 1 in 5 times     How often have you found you stopped and started again several times when you urinated? Not at All     How often have you found it difficult to postpone urination? Less than half the time     How often have you had a weak urinary stream? Not at All     How often have you had to strain to start urination? Not at All     How many times did you typically get up at night to urinate? 1 Time     Total IPSS Score 4            Quality of Life due to urinary symptoms   If you were to spend the rest of your life with your urinary condition just the way it is now how would you feel about that? Mostly Satisfied            Score:  1-7 Mild 8-19 Moderate 20-35 Severe   PMH: Past Medical History:  Diagnosis Date   Arthritis    Asthma    Cancer ([place]) 2018   bladder   Cataracts, bilateral    Cold    COPD (chronic obstructive pulmonary disease) (HCC)    Coronary artery disease    DDD (degenerative disc disease), cervical    Depression    Diabetes mellitus without complication (HCC)    Dyspnea    GERD (gastroesophageal reflux disease)    Hearing loss associated with syndrome of both ears    Heart attack ([place]) 2016   stented x 1   History of aspiration pneumonitis    History of kidney stones    Hyperlipidemia    Kidney cysts    Kidney stones    Osteoarthritis of back    Psoriasis    Psoriasis    Seasonal allergies    Wears dentures    full upper  Surgical History: Past Surgical History:  Procedure Laterality Date   CARDIAC CATHETERIZATION  2016 and 2018   CATARACT EXTRACTION W/PHACO Left 05/27/2020   Procedure: CATARACT EXTRACTION PHACO AND INTRAOCULAR LENS PLACEMENT ([place]) LEFT DIABETIC;  Surgeon: Eulogio Bear, MD;  Location: [facility];  Service: Ophthalmology;  Laterality: Left;  3.73 0:38.1   CATARACT EXTRACTION W/PHACO Right 06/21/2020   Procedure: CATARACT EXTRACTION PHACO AND INTRAOCULAR LENS PLACEMENT ([place]) RIGHT DIABETIC;  Surgeon: Eulogio Bear, MD;  Location: [facility];  Service: Ophthalmology;  Laterality: Right;  3.20 0:30.5   COLONOSCOPY     COLONOSCOPY WITH PROPOFOL N/A 09/18/2017   Procedure: COLONOSCOPY WITH PROPOFOL;  Surgeon: Lollie Sails, MD;  Location: [facility] ENDOSCOPY;  Service: Endoscopy;  Laterality: N/A;   CORONARY ANGIOPLASTY  2016   CORONARY STENT PLACEMENT  2016   CYSTOSCOPY W/ RETROGRADES  Bilateral 06/12/2016   Procedure: CYSTOSCOPY WITH RETROGRADE PYELOGRAM;  Surgeon: Hollice Espy, MD;  Location: [facility];  Service: Urology;  Laterality: Bilateral;   CYSTOSCOPY W/ RETROGRADES Bilateral 10/02/2017   Procedure: CYSTOSCOPY WITH RETROGRADE PYELOGRAM;  Surgeon: Hollice Espy, MD;  Location: [facility];  Service: Urology;  Laterality: Bilateral;   CYSTOSCOPY WITH BIOPSY N/A 10/02/2017   Procedure: CYSTOSCOPY WITH Bladder BIOPSY;  Surgeon: Hollice Espy, MD;  Location: [facility];  Service: Urology;  Laterality: N/A;   CYSTOSCOPY WITH BIOPSY N/A 11/10/2019   Procedure: CYSTOSCOPY WITH Bladder BIOPSY;  Surgeon: Hollice Espy, MD;  Location: [facility];  Service: Urology;  Laterality: N/A;   CYSTOSCOPY WITH INSERTION OF UROLIFT N/A 11/10/2019   Procedure: CYSTOSCOPY WITH INSERTION OF UROLIFT;  Surgeon: Hollice Espy, MD;  Location: [facility];  Service: Urology;  Laterality: N/A;   CYSTOSCOPY WITH STENT PLACEMENT Right 11/10/2019   Procedure: CYSTOSCOPY WITH STENT PLACEMENT;  Surgeon: Hollice Espy, MD;  Location: [facility];  Service: Urology;  Laterality: Right;   CYSTOSCOPY WITH URETEROSCOPY Right 10/02/2017   Procedure: DIAGNOSTIC RIGHT URETEROSCOPY;  Surgeon: Hollice Espy, MD;  Location: [facility];  Service: Urology;  Laterality: Right;   ESOPHAGOGASTRODUODENOSCOPY (EGD) WITH PROPOFOL N/A 09/18/2017   Procedure: ESOPHAGOGASTRODUODENOSCOPY (EGD) WITH PROPOFOL;  Surgeon: Lollie Sails, MD;  Location: [facility];  Service: Endoscopy;  Laterality: N/A;   fatty tumors removed     HIATAL HERNIA REPAIR  1992   LIPOMA EXCISION     right leg   NASOPHARYNGOSCOPY EUSTATION TUBE BALLOON DILATION Bilateral 06/13/2018   Procedure: NASOPHARYNGOSCOPY EUSTATION TUBE BALLOON DILATION DIABETIC, NEED EUSTATION TUBE BALLOON SYSTEM;  Surgeon: Margaretha Sheffield, MD;  Location: [facility];  Service: ENT;  Laterality: Bilateral;   RIGHT/LEFT HEART CATH AND CORONARY ANGIOGRAPHY N/A  12/21/2016   Procedure: Right/Left Heart Cath and Coronary Angiography;  Surgeon: Isaias Cowman, MD;  Location: [facility];  Service: Cardiovascular;  Laterality: N/A;   SINUS EXPLORATION  1992   TRANSURETHRAL RESECTION OF BLADDER TUMOR WITH MITOMYCIN-C N/A 06/12/2016   Procedure: TRANSURETHRAL RESECTION OF BLADDER TUMOR WITH MITOMYCIN-C;  Surgeon: Hollice Espy, MD;  Location: [facility];  Service: Urology;  Laterality: N/A;   TURBINATE REDUCTION Bilateral 06/13/2018   Procedure: Orrin Brigham;  Surgeon: Margaretha Sheffield, MD;  Location: [facility];  Service: ENT;  Laterality: Bilateral;  Diabetic - insulin   UMBILICAL HERNIA REPAIR N/A 06/12/2016   Procedure: HERNIA REPAIR UMBILICAL ADULT;  Surgeon: Christene Lye, MD;  Location: [facility];  Service: General;  Laterality: N/A;   URETEROSCOPY Right 06/12/2016   Procedure: URETEROSCOPY;  Surgeon: Hollice Espy, MD;  Location: [facility];  Service: Urology;  Laterality: Right;   URETEROSCOPY WITH HOLMIUM LASER LITHOTRIPSY Right 11/10/2019   Procedure: URETEROSCOPY WITH HOLMIUM LASER LITHOTRIPSY;  Surgeon: Hollice Espy, MD;  Location: [facility];  Service: Urology;  Laterality: Right;    Home Medications:  Allergies as of 07/06/2020      Reactions   Actos [pioglitazone] Palpitations   Pt reported a MI    Glimepiride Rash   Shortness of breath also   Glipizide Shortness Of Breath   Metformin Shortness Of Breath   Pt states he "can't breathe and it gives him sinus congestion".   Versed [midazolam]    Over-sedation.  Hard to clear from system.   Amoxicillin Nausea Only   Erythromycin Base Nausea Only   Esomeprazole Magnesium Other (See Comments)   Makes patients heart burn worse   Lipitor [atorvastatin]    indigestion   Liraglutide Other (See Comments)   bloating   Penicillins Nausea Only   Has patient had a PCN reaction causing immediate rash, facial/tongue/throat swelling, SOB or lightheadedness with  hypotension: no Has patient had a PCN reaction causing severe rash involving mucus membranes or skin necrosis: no Has patient had a PCN reaction that required hospitalization:no Has patient had a PCN reaction occurring within the last 10 years: no If all of the above answers are "NO", then may proceed with Cephalosporin use.   Sitagliptin Other (See Comments)   Pt states it "gives him joint pain" & redness      Medication List       Accurate as of July 06, 2020 11:59 PM. If you have any questions, ask your nurse or doctor.        albuterol 108 (90 Base) MCG/ACT inhaler Commonly known as: VENTOLIN HFA Inhale 2 puffs into the lungs every 6 (six) hours as needed for wheezing or shortness of breath.   clobetasol cream 0.05 % Commonly known as: TEMOVATE Apply 1 application topically 2 (two) times daily as needed (psoriasis).   DICLOFENAC PO Take by mouth as needed.   fluocinonide cream 0.05 % Commonly known as: LIDEX Apply 1 application topically as needed.   fluticasone 50 MCG/ACT nasal spray Commonly known as: FLONASE Place into both nostrils daily as needed for allergies or rhinitis.   hydrocortisone 10 MG tablet Commonly known as: CORTEF Take 10 mg by mouth daily. Was ordered by dr. Sheppard Evens ([place])   hydrocortisone 5 MG tablet Commonly known as: CORTEF TAKE 1 TABLET BY MOUTH WITH BREAKFAST AND 1 TABLET BY MOUTH WITH LUNCH   insulin NPH-regular Human (70-30) 100 UNIT/ML injection Inject 12 Units into the skin 2 (two) times daily with a meal.   oxybutynin 5 MG tablet Commonly known as: DITROPAN Take by mouth.   ReliOn Pen Needles 31G X 6 MM Misc Generic drug: Insulin Pen Needle   rosuvastatin 5 MG tablet Commonly known as: CRESTOR Take 5 mg by mouth daily.   Symbicort 160-4.5 MCG/ACT inhaler Generic drug: budesonide-formoterol Inhale 2 puffs into the lungs 2 (two) times daily as needed for shortness of breath.   testosterone 50 MG/5GM (1%)  Gel Commonly known as: ANDROGEL Place 5 g onto the skin daily as needed.       Allergies:  Allergies  Allergen Reactions   Actos [Pioglitazone] Palpitations    Pt reported a MI    Glimepiride Rash    Shortness of breath also   Glipizide Shortness Of Breath   Metformin Shortness Of Breath    Pt states  he "can't breathe and it gives him sinus congestion".    Versed [Midazolam]     Over-sedation.  Hard to clear from system.   Amoxicillin Nausea Only   Erythromycin Base Nausea Only   Esomeprazole Magnesium Other (See Comments)    Makes patients heart burn worse   Lipitor [Atorvastatin]     indigestion   Liraglutide Other (See Comments)    bloating   Penicillins Nausea Only    Has patient had a PCN reaction causing immediate rash, facial/tongue/throat swelling, SOB or lightheadedness with hypotension: no Has patient had a PCN reaction causing severe rash involving mucus membranes or skin necrosis: no Has patient had a PCN reaction that required hospitalization:no Has patient had a PCN reaction occurring within the last 10 years: no If all of the above answers are "NO", then may proceed with Cephalosporin use.    Sitagliptin Other (See Comments)    Pt states it "gives him joint pain" & redness     Family History: Family History  Problem Relation Age of Onset   Cancer Mother    Prostate cancer Maternal Uncle    Prostate cancer Paternal Uncle    Prostate cancer Cousin    Congestive Heart Failure Father    Kidney cancer Neg Hx    Bladder Cancer Neg Hx     Social History:  reports that he quit smoking about 30 years ago. His smoking use included cigarettes. He quit after 30.00 years of use. He quit smokeless tobacco use about 30 years ago.  His smokeless tobacco use included chew. He reports current alcohol use. He reports that he does not use drugs.   Physical Exam: BP (!) 171/99    Pulse 88    Wt 222 lb (100.7 kg)    BMI 32.78 kg/m    Constitutional:  Alert and oriented, No acute distress. HEENT: [place] AT, moist mucus membranes.  Trachea midline, no masses. Cardiovascular: No clubbing, cyanosis, or edema. Respiratory: Normal respiratory effort, no increased work of breathing. GI: Abdomen is soft, nontender, nondistended, no abdominal masses GU: No CVA tenderness. Skin: No rashes, bruises or suspicious lesions. Neurologic: Grossly intact, no focal deficits, moving all 4 extremities. Psychiatric: Normal mood and affect.  Laboratory Data: Lab Results  Component Value Date   WBC 5.8 11/06/2019   HGB 12.7 (L) 11/06/2019   HCT 38.3 (L) 11/06/2019   MCV 94.3 11/06/2019   PLT 207 11/06/2019    Lab Results  Component Value Date   CREATININE 1.21 11/06/2019   Urinalysis Results for orders placed or performed in visit on 07/06/20  CULTURE, URINE COMPREHENSIVE   Specimen: Urine   UR  Result Value Ref Range   Urine Culture, Comprehensive Final report    Organism ID, Bacteria Comment   Microscopic Examination   Urine  Result Value Ref Range   WBC, UA 11-30 (A) 0 - 5 /hpf   RBC 0-2 0 - 2 /hpf   Epithelial Cells (non renal) 0-10 0 - 10 /hpf   Casts Present (A) None seen /lpf   Cast Type Hyaline casts N/A   Bacteria, UA None seen None seen/Few  Urinalysis, Complete  Result Value Ref Range   Specific Gravity, UA 1.025 1.005 - 1.030   pH, UA 5.0 5.0 - 7.5   Color, UA Yellow Yellow   Appearance Ur Clear Clear   Leukocytes,UA Trace (A) Negative   Protein,UA Negative Negative/Trace   Glucose, UA Negative Negative   Ketones, UA Negative Negative   RBC,  UA Negative Negative   Bilirubin, UA Negative Negative   Urobilinogen, Ur 0.2 0.2 - 1.0 mg/dL   Nitrite, UA Negative Negative   Microscopic Examination See below:   BLADDER SCAN AMB NON-IMAGING  Result Value Ref Range   Scan Result 0 ml     Pertinent Imaging: KUB was personally reviewed today, there is no obvious stone burden although his stones identical in  the past.  Awaiting final radiologic evaluation.  Assessment & Plan:    1. Benign prostatic hyperplasia \ with urinary frequency IPSS as above, symptomatically improved after UroLift with an adequate emptying  Continues to take oxybutynin for frequency as needed, continue this as desired - Urinalysis, Complete - BLADDER SCAN AMB NON-IMAGING - CULTURE, URINE COMPREHENSIVE  2. Flank pain New onset right flank pain with history of stones  Urinalysis today is fairly unremarkable, no clear evidence of infection we will culture to rule this out  Stones none overtly visible following ureter today, as previously had uric acid stones as primary component thus they may be uncontrolled on the study  We discussed renal ultrasound versus CT scan.  Prefer CT as this will give [place] more information about size and location if there is a bacteremic obstructing stone.  He is agreeable this plan, will schedule noncontrast CT scan. - CT RENAL STONE STUDY; Future - CULTURE, URINE COMPREHENSIVE  3. History of kidney stones As above  May benefit from metabolic evaluation possibly urinary alkalinization versus uric acid levels for stone prevention if he in fact does have recurrent stone disease  4. History of bladder cancer Due for cystoscopy summer 2022, schedule today   Hollice Espy, MD  [facility] [address] [place], [place] [address] [phone]   I spent 40 total minutes on the day of the encounter including pre-visit review of the medical record, face-to-face time with the patient, and post visit ordering of labs/imaging/tests.

## 2020-07-07 LAB — MICROSCOPIC EXAMINATION: Bacteria, UA: NONE SEEN

## 2020-07-07 LAB — URINALYSIS, COMPLETE
Bilirubin, UA: NEGATIVE
Glucose, UA: NEGATIVE
Ketones, UA: NEGATIVE
Nitrite, UA: NEGATIVE
Protein,UA: NEGATIVE
RBC, UA: NEGATIVE
Specific Gravity, UA: 1.025 (ref 1.005–1.030)
Urobilinogen, Ur: 0.2 mg/dL (ref 0.2–1.0)
pH, UA: 5 (ref 5.0–7.5)

## 2020-07-09 ENCOUNTER — Telehealth: Payer: Self-pay | Admitting: *Deleted

## 2020-07-09 LAB — CULTURE, URINE COMPREHENSIVE

## 2020-07-09 NOTE — Telephone Encounter (Addendum)
Left patient a VM with details, asked to return call with any questions.    ----- Message from Hollice Espy, MD sent at 07/09/2020  9:59 AM EST ----- Please let him know there is no infection.  Awaiting results of CT scan.  Hollice Espy, MD

## 2020-07-20 ENCOUNTER — Other Ambulatory Visit: Payer: Self-pay

## 2020-07-20 ENCOUNTER — Ambulatory Visit
Admission: RE | Admit: 2020-07-20 | Discharge: 2020-07-20 | Disposition: A | Discharge status: Home / Self Care | Payer: Medicare HMO | Source: Ambulatory Visit | Attending: Urology | Admitting: Urology

## 2020-07-20 DIAGNOSIS — R109 Unspecified abdominal pain: Secondary | ICD-10-CM | POA: Diagnosis not present

## 2020-07-21 ENCOUNTER — Telehealth: Payer: Self-pay | Admitting: *Deleted

## 2020-07-21 NOTE — Telephone Encounter (Addendum)
Left patient VM asked to return call with any questions.   ----- Message from Hollice Espy, MD sent at 07/21/2020 11:30 AM EST ----- No acute or obstructing stones.  Great news.  We will see you at your follow-up.  Hollice Espy, MD

## 2020-09-01 ENCOUNTER — Other Ambulatory Visit: Payer: Self-pay | Admitting: Physician Assistant

## 2020-09-01 ENCOUNTER — Ambulatory Visit: Admit: 2020-09-01 | Discharge: 2020-09-02 | Payer: MEDICARE | Attending: Family | Primary: Family

## 2020-09-01 DIAGNOSIS — M818 Other osteoporosis without current pathological fracture: Principal | ICD-10-CM

## 2020-09-01 DIAGNOSIS — M545 Acute right-sided low back pain without sciatica: Principal | ICD-10-CM

## 2020-09-01 DIAGNOSIS — E119 Type 2 diabetes mellitus without complications: Principal | ICD-10-CM

## 2020-09-01 DIAGNOSIS — Z87442 Personal history of urinary calculi: Principal | ICD-10-CM

## 2020-09-01 DIAGNOSIS — L409 Psoriasis, unspecified: Principal | ICD-10-CM

## 2020-09-01 DIAGNOSIS — R109 Unspecified abdominal pain: Secondary | ICD-10-CM

## 2020-09-01 MED ORDER — FLUOCINONIDE 0.05 % TOPICAL SOLUTION
Freq: Two times a day (BID) | TOPICAL | 1 refills | 0 days | Status: CP
Start: 2020-09-01 — End: ?

## 2020-09-01 MED ORDER — POLYETHYLENE GLYCOL 3350 17 GRAM ORAL POWDER PACKET
PACK | Freq: Every day | ORAL | 0 refills | 10 days | Status: CP
Start: 2020-09-01 — End: ?

## 2020-09-01 MED ORDER — HYDROCODONE 5 MG-ACETAMINOPHEN 325 MG TABLET
ORAL_TABLET | Freq: Four times a day (QID) | ORAL | 0 refills | 10 days | Status: CP | PRN
Start: 2020-09-01 — End: ?

## 2020-09-01 MED ORDER — CLOBETASOL 0.05 % TOPICAL CREAM
Freq: Two times a day (BID) | TOPICAL | 0 refills | 0 days | Status: CP
Start: 2020-09-01 — End: ?

## 2020-09-02 ENCOUNTER — Ambulatory Visit
Admission: RE | Admit: 2020-09-02 | Discharge: 2020-09-02 | Disposition: A | Discharge status: Home / Self Care | Payer: Medicare HMO | Source: Ambulatory Visit | Attending: Physician Assistant | Admitting: Physician Assistant

## 2020-09-02 ENCOUNTER — Other Ambulatory Visit: Payer: Self-pay

## 2020-09-02 ENCOUNTER — Encounter: Payer: Self-pay | Admitting: Physician Assistant

## 2020-09-02 ENCOUNTER — Ambulatory Visit: Payer: Medicare HMO | Admitting: Physician Assistant

## 2020-09-02 VITALS — BP 143/79 | HR 74 | Ht 70.0 in | Wt 227.0 lb

## 2020-09-02 DIAGNOSIS — R109 Unspecified abdominal pain: Secondary | ICD-10-CM

## 2020-09-02 DIAGNOSIS — Z87442 Personal history of urinary calculi: Secondary | ICD-10-CM

## 2020-09-02 LAB — URINALYSIS, COMPLETE
Bilirubin, UA: NEGATIVE
Glucose, UA: NEGATIVE
Ketones, UA: NEGATIVE
Nitrite, UA: NEGATIVE
RBC, UA: NEGATIVE
Specific Gravity, UA: 1.02 (ref 1.005–1.030)
Urobilinogen, Ur: 0.2 mg/dL (ref 0.2–1.0)
pH, UA: 5 (ref 5.0–7.5)

## 2020-09-02 LAB — MICROSCOPIC EXAMINATION: RBC, Urine: NONE SEEN /hpf (ref 0–2)

## 2020-09-02 MED ORDER — POTASSIUM CITRATE ER 15 MEQ (1620 MG) PO TBCR: 2.0000 | EXTENDED_RELEASE_TABLET | Freq: Two times a day (BID) | ORAL | 5 refills | Status: DC

## 2020-09-02 NOTE — Progress Notes (Signed)
09/02/2020 11:46 AM   Dustin Blackburn [birth year] [id number]  CC: Chief Complaint  Patient presents with  . Nephrolithiasis    HPI: Dustin Blackburn is a 80 y.o. 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  He was seen in clinic most recently by Dr. Erlene Quan on 07/06/2020, at which point he reported intermittent right flank pain.  He underwent KUB and CT stone study for further evaluation of these.  CT stone study revealed bilateral nonobstructing nephrolithiasis, measuring up to 11 mm on the right side.   Today he reports stable right flank pain x1 year.  He describes the pain as sharp and constantly present but exacerbated with movement, heavy lifting, and lying in bed.  He states his symptoms have not changed at all since his most recent imaging.  He reports being tired of hurting and he is frustrated with being told that he is not passing a stone, as the pain feels similar to past stone episodes.  He asks if we can use ultrasound to treat his stones today.  He has a history of spinal arthritis and has seen orthopedics for this in the past.  They diagnosed him with musculoskeletal pain and gave him pain medication.  Patient reports he is disinterested in taking pain pills due to GI upset or pursuing physical therapy because he does not believe it will help.  He underwent KUB today which does not show any apparent ureteral stones.  In-office UA today positive for trace protein and trace leukocyte esterase; urine microscopy with 11-30 WBCs/HPF.  PMH: Past Medical History:  Diagnosis Date  . Arthritis   . Asthma   . Cancer ([place]) 2018   bladder  . Cataracts, bilateral   . Cold   . COPD (chronic obstructive pulmonary disease) ([place])   . Coronary artery disease   . DDD (degenerative disc disease), cervical   . Depression   . Diabetes mellitus without complication  ([place])   . Dyspnea   . GERD (gastroesophageal reflux disease)   . Hearing loss associated with syndrome of both ears   . Heart attack ([place]) 2016   stented x 1  . History of aspiration pneumonitis   . History of kidney stones   . Hyperlipidemia   . Kidney cysts   . Kidney stones   . Osteoarthritis of back   . Psoriasis   . Psoriasis   . Seasonal allergies   . Wears dentures    full upper    Surgical History: Past Surgical History:  Procedure Laterality Date  . CARDIAC CATHETERIZATION  2016 and 2018  . CATARACT EXTRACTION W/PHACO Left 05/27/2020   Procedure: CATARACT EXTRACTION PHACO AND INTRAOCULAR LENS PLACEMENT ([place]) LEFT DIABETIC;  Surgeon: Eulogio Bear, MD;  Location: [facility];  Service: Ophthalmology;  Laterality: Left;  3.73 0:38.1  . CATARACT EXTRACTION W/PHACO Right 06/21/2020   Procedure: CATARACT EXTRACTION PHACO AND INTRAOCULAR LENS PLACEMENT ([place]) RIGHT DIABETIC;  Surgeon: Eulogio Bear, MD;  Location: [facility];  Service: Ophthalmology;  Laterality: Right;  3.20 0:30.5  . COLONOSCOPY    . COLONOSCOPY WITH PROPOFOL N/A 09/18/2017   Procedure: COLONOSCOPY WITH PROPOFOL;  Surgeon: Lollie Sails, MD;  Location: [facility] ENDOSCOPY;  Service: Endoscopy;  Laterality: N/A;  . CORONARY ANGIOPLASTY  2016  . CORONARY STENT PLACEMENT  2016  . CYSTOSCOPY W/ RETROGRADES Bilateral 06/12/2016   Procedure:  CYSTOSCOPY WITH RETROGRADE PYELOGRAM;  Surgeon: Hollice Espy, MD;  Location: [facility] ORS;  Service: Urology;  Laterality: Bilateral;  . CYSTOSCOPY W/ RETROGRADES Bilateral 10/02/2017   Procedure: CYSTOSCOPY WITH RETROGRADE PYELOGRAM;  Surgeon: Hollice Espy, MD;  Location: [facility]S;  Service: Urology;  Laterality: Bilateral;  . CYSTOSCOPY WITH BIOPSY N/A 10/02/2017   Procedure: CYSTOSCOPY WITH Bladder BIOPSY;  Surgeon: Hollice Espy, MD;  Location: [facility];  Service: Urology;  Laterality: N/A;  . CYSTOSCOPY WITH BIOPSY N/A 11/10/2019   Procedure:  CYSTOSCOPY WITH Bladder BIOPSY;  Surgeon: Hollice Espy, MD;  Location: [facility];  Service: Urology;  Laterality: N/A;  . CYSTOSCOPY WITH INSERTION OF UROLIFT N/A 11/10/2019   Procedure: CYSTOSCOPY WITH INSERTION OF UROLIFT;  Surgeon: Hollice Espy, MD;  Location: [facility];  Service: Urology;  Laterality: N/A;  . CYSTOSCOPY WITH STENT PLACEMENT Right 11/10/2019   Procedure: CYSTOSCOPY WITH STENT PLACEMENT;  Surgeon: Hollice Espy, MD;  Location: [facility];  Service: Urology;  Laterality: Right;  . CYSTOSCOPY WITH URETEROSCOPY Right 10/02/2017   Procedure: DIAGNOSTIC RIGHT URETEROSCOPY;  Surgeon: Hollice Espy, MD;  Location: [facility];  Service: Urology;  Laterality: Right;  . ESOPHAGOGASTRODUODENOSCOPY (EGD) WITH PROPOFOL N/A 09/18/2017   Procedure: ESOPHAGOGASTRODUODENOSCOPY (EGD) WITH PROPOFOL;  Surgeon: Lollie Sails, MD;  Location: [facility] ENDOSCOPY;  Service: Endoscopy;  Laterality: N/A;  . fatty tumors removed    . HIATAL HERNIA REPAIR  1992  . LIPOMA EXCISION     right leg  . NASOPHARYNGOSCOPY EUSTATION TUBE BALLOON DILATION Bilateral 06/13/2018   Procedure: NASOPHARYNGOSCOPY EUSTATION TUBE BALLOON DILATION DIABETIC, NEED EUSTATION TUBE BALLOON SYSTEM;  Surgeon: Margaretha Sheffield, MD;  Location: [facility];  Service: ENT;  Laterality: Bilateral;  . RIGHT/LEFT HEART CATH AND CORONARY ANGIOGRAPHY N/A 12/21/2016   Procedure: Right/Left Heart Cath and Coronary Angiography;  Surgeon: Isaias Cowman, MD;  Location: [facility];  Service: Cardiovascular;  Laterality: N/A;  . SINUS EXPLORATION  1992  . TRANSURETHRAL RESECTION OF BLADDER TUMOR WITH MITOMYCIN-C N/A 06/12/2016   Procedure: TRANSURETHRAL RESECTION OF BLADDER TUMOR WITH MITOMYCIN-C;  Surgeon: Hollice Espy, MD;  Location: [facility];  Service: Urology;  Laterality: N/A;  . TURBINATE REDUCTION Bilateral 06/13/2018   Procedure: Orrin Brigham;  Surgeon: Margaretha Sheffield, MD;  Location: [facility];   Service: ENT;  Laterality: Bilateral;  Diabetic - insulin  . UMBILICAL HERNIA REPAIR N/A 06/12/2016   Procedure: HERNIA REPAIR UMBILICAL ADULT;  Surgeon: Christene Lye, MD;  Location: [facility];  Service: General;  Laterality: N/A;  . URETEROSCOPY Right 06/12/2016   Procedure: URETEROSCOPY;  Surgeon: Hollice Espy, MD;  Location: [facility];  Service: Urology;  Laterality: Right;  . URETEROSCOPY WITH HOLMIUM LASER LITHOTRIPSY Right 11/10/2019   Procedure: URETEROSCOPY WITH HOLMIUM LASER LITHOTRIPSY;  Surgeon: Hollice Espy, MD;  Location: [facility];  Service: Urology;  Laterality: Right;    Home Medications:  Allergies as of 09/02/2020      Reactions   Actos [pioglitazone] Palpitations   Pt reported a MI    Glimepiride Rash   Shortness of breath also   Glipizide Shortness Of Breath   Metformin Shortness Of Breath   Pt states he "can't breathe and it gives him sinus congestion".   Versed [midazolam]    Over-sedation.  Hard to clear from system.   Amoxicillin Nausea Only   Erythromycin Base Nausea Only   Esomeprazole Magnesium Other (See Comments)   Makes patients heart burn worse   Lipitor [atorvastatin]    indigestion   Liraglutide  Other (See Comments)   bloating   Penicillins Nausea Only   Has patient had a PCN reaction causing immediate rash, facial/tongue/throat swelling, SOB or lightheadedness with hypotension: no Has patient had a PCN reaction causing severe rash involving mucus membranes or skin necrosis: no Has patient had a PCN reaction that required hospitalization:no Has patient had a PCN reaction occurring within the last 10 years: no If all of the above answers are "NO", then may proceed with Cephalosporin use.   Sitagliptin Other (See Comments)   Pt states it "gives him joint pain" & redness      Medication List       Accurate as of September 02, 2020 11:46 AM. If you have any questions, ask your nurse or doctor.        albuterol 108 (90 Base) MCG/ACT  inhaler Commonly known as: VENTOLIN HFA Inhale 2 puffs into the lungs every 6 (six) hours as needed for wheezing or shortness of breath.   clobetasol cream 0.05 % Commonly known as: TEMOVATE Apply 1 application topically 2 (two) times daily as needed (psoriasis).   DICLOFENAC PO Take by mouth as needed.   fluocinonide cream 0.05 % Commonly known as: LIDEX Apply 1 application topically as needed.   fluticasone 50 MCG/ACT nasal spray Commonly known as: FLONASE Place into both nostrils daily as needed for allergies or rhinitis.   hydrocortisone 10 MG tablet Commonly known as: CORTEF Take 10 mg by mouth daily. Was ordered by dr. Sheppard Evens ([place])   hydrocortisone 5 MG tablet Commonly known as: CORTEF TAKE 1 TABLET BY MOUTH WITH BREAKFAST AND 1 TABLET BY MOUTH WITH LUNCH   insulin NPH-regular Human (70-30) 100 UNIT/ML injection Inject 12 Units into the skin 2 (two) times daily with a meal.   oxybutynin 5 MG tablet Commonly known as: DITROPAN Take by mouth.   Potassium Citrate 15 MEQ (1620 MG) Tbcr Take 2 tablets by mouth 2 (two) times daily with a meal. Started by: Debroah Loop, PA-C   ReliOn Pen Needles 31G X 6 MM Misc Generic drug: Insulin Pen Needle   rosuvastatin 5 MG tablet Commonly known as: CRESTOR Take 5 mg by mouth daily.   Symbicort 160-4.5 MCG/ACT inhaler Generic drug: budesonide-formoterol Inhale 2 puffs into the lungs 2 (two) times daily as needed for shortness of breath.   testosterone 50 MG/5GM (1%) Gel Commonly known as: ANDROGEL Place 5 g onto the skin daily as needed.       Allergies:  Allergies  Allergen Reactions  . Actos [Pioglitazone] Palpitations    Pt reported a MI   . Glimepiride Rash    Shortness of breath also  . Glipizide Shortness Of Breath  . Metformin Shortness Of Breath    Pt states he "can't breathe and it gives him sinus congestion".   . Versed [Midazolam]     Over-sedation.  Hard to clear from system.  Marland Kitchen  Amoxicillin Nausea Only  . Erythromycin Base Nausea Only  . Esomeprazole Magnesium Other (See Comments)    Makes patients heart burn worse  . Lipitor [Atorvastatin]     indigestion  . Liraglutide Other (See Comments)    bloating  . Penicillins Nausea Only    Has patient had a PCN reaction causing immediate rash, facial/tongue/throat swelling, SOB or lightheadedness with hypotension: no Has patient had a PCN reaction causing severe rash involving mucus membranes or skin necrosis: no Has patient had a PCN reaction that required hospitalization:no Has patient had a PCN reaction occurring within  the last 10 years: no If all of the above answers are "NO", then may proceed with Cephalosporin use.   . Sitagliptin Other (See Comments)    Pt states it "gives him joint pain" & redness     Family History: Family History  Problem Relation Age of Onset  . Cancer Mother   . Prostate cancer Maternal Uncle   . Prostate cancer Paternal Uncle   . Prostate cancer Cousin   . Congestive Heart Failure Father   . Kidney cancer Neg Hx   . Bladder Cancer Neg Hx     Social History:   reports that he quit smoking about 30 years ago. His smoking use included cigarettes. He quit after 30.00 years of use. He quit smokeless tobacco use about 30 years ago.  His smokeless tobacco use included chew. He reports current alcohol use. He reports that he does not use drugs.  Physical Exam: BP (!) 143/79   Pulse 74   Ht 5\' 10"  (1.778 m)   Wt 227 lb (103 kg)   BMI 32.57 kg/m   Constitutional:  Alert and oriented, no acute distress, nontoxic appearing HEENT: [place], AT Cardiovascular: No clubbing, cyanosis, or edema Respiratory: Normal respiratory effort, no increased work of breathing GU: No CVA tenderness MSK: No point tenderness of the spine or bilateral flanks Skin: No rashes, bruises or suspicious lesions Neurologic: Grossly intact, no focal deficits, moving all 4 extremities Psychiatric: Normal mood and  affect  Laboratory Data: Results for orders placed or performed in visit on 09/02/20  Microscopic Examination   Urine  Result Value Ref Range   WBC, UA 11-30 (A) 0 - 5 /hpf   RBC None seen 0 - 2 /hpf   Epithelial Cells (non renal) 0-10 0 - 10 /hpf   Casts Present (A) None seen /lpf   Cast Type Hyaline casts N/A   Bacteria, UA Few None seen/Few  Urinalysis, Complete  Result Value Ref Range   Specific Gravity, UA 1.020 1.005 - 1.030   pH, UA 5.0 5.0 - 7.5   Color, UA Yellow Yellow   Appearance Ur Hazy (A) Clear   Leukocytes,UA Trace (A) Negative   Protein,UA Trace (A) Negative/Trace   Glucose, UA Negative Negative   Ketones, UA Negative Negative   RBC, UA Negative Negative   Bilirubin, UA Negative Negative   Urobilinogen, Ur 0.2 0.2 - 1.0 mg/dL   Nitrite, UA Negative Negative   Microscopic Examination See below:    Pertinent Imaging: KUB, 09/02/2020: CLINICAL DATA:  Flank pain, history of nephrolithiasis and bladder cancer  EXAM: ABDOMEN - 1 VIEW  COMPARISON:  07/20/2020 CT abdomen/pelvis. 07/06/2020 abdominal radiograph  FINDINGS: Faint 11 x 5 mm lower right renal stone. Tiny 2 mm calcification overlying the mid to upper left kidney. No radiopaque stones overlying the expected locations of the ureters or bladder. Surgical clips overlie lower bladder region, unchanged. No dilated small bowel loops. Mild to moderate colonic stool. No evidence of pneumatosis or pneumoperitoneum. Mild-to-moderate thoracolumbar spondylosis.  IMPRESSION: Bilateral nephrolithiasis as detailed.   Electronically Signed   By: Ilona Sorrel M.D.   On: 09/03/2020 14:20  I personally reviewed the images referenced above and note no apparent ureteral stones.  Assessment & Plan:   1. Flank pain with history of urolithiasis Stable, chronic right flank pain with recent cross-sectional imaging reassuring for acute stone episode and stable UA today.  I explained to the patient that I do  not believe he is passing a stone and  as his renal stones are nonobstructing, I think they are unlikely to be the source of his pain.  Given his previous diagnosis of spinal arthritis and MSK pain, I explained that this is more likely to be the source of his discomfort and I encouraged him to follow-up with his orthopedist.  Per Dr. Cherrie Gauze prior note, I am starting him on potassium citrate 60 mEq daily for urinary alkalinization in the setting of uric acid nephrolithiasis.  I explained that this may work to dissolve his existing stones and/or prevent stone growth.  He expressed understanding. - Urinalysis, Complete - CULTURE, URINE COMPREHENSIVE - Potassium Citrate 15 MEQ (1620 MG) TBCR; Take 2 tablets by mouth 2 (two) times daily with a meal.  Dispense: 120 tablet; Refill: 5  Return if symptoms worsen or fail to improve.  Debroah Loop, PA-C  [facility] [address], [place], [place] [address] [phone]

## 2020-09-02 NOTE — Patient Instructions (Addendum)
Start potassium citrate supplements twice daily with meals and stay well hydrated.

## 2020-09-10 LAB — CULTURE, URINE COMPREHENSIVE

## 2020-09-15 ENCOUNTER — Telehealth: Payer: Self-pay

## 2020-09-15 ENCOUNTER — Ambulatory Visit: Admit: 2020-09-15 | Discharge: 2020-09-15 | Payer: MEDICARE

## 2020-09-15 ENCOUNTER — Ambulatory Visit: Admit: 2020-09-15 | Discharge: 2020-09-15 | Payer: MEDICARE | Attending: "Endocrinology | Primary: "Endocrinology

## 2020-09-15 DIAGNOSIS — Z125 Encounter for screening for malignant neoplasm of prostate: Principal | ICD-10-CM

## 2020-09-15 DIAGNOSIS — E291 Testicular hypofunction: Principal | ICD-10-CM

## 2020-09-15 DIAGNOSIS — E119 Type 2 diabetes mellitus without complications: Principal | ICD-10-CM

## 2020-09-15 MED ORDER — FREESTYLE LIBRE 2 SENSOR KIT
0 refills | 0 days | Status: CP
Start: 2020-09-15 — End: 2021-09-15

## 2020-09-15 MED ORDER — FREESTYLE LIBRE 2 READER
2 refills | 0.00000 days | Status: CP
Start: 2020-09-15 — End: ?

## 2020-09-15 MED ORDER — DAPAGLIFLOZIN 5 MG TABLET
ORAL_TABLET | Freq: Every morning | ORAL | 3 refills | 30.00000 days | Status: CP
Start: 2020-09-15 — End: 2021-09-15

## 2020-09-15 NOTE — Telephone Encounter (Signed)
LMOM for patient to return call. See note below.

## 2020-09-15 NOTE — Telephone Encounter (Signed)
-----   Message from Debroah Loop, [place] sent at 09/14/2020  4:50 PM EDT ----- Please contact him and ask if he's having any infective symptoms. In the absence of these, culture results likely represent urinary contamination and do not require treatment. ----- Message ----- From: Interface, [facility] Lab Results In Sent: 09/02/2020   4:37 PM EDT To: Debroah Loop, PA-C

## 2020-09-16 NOTE — Telephone Encounter (Signed)
Patient reports no burning, no increase in frequency or urgency, only that his kidneys "hurt." He also states that he could only take 4 days of the potassium citrate because it was making him sick. Advised patient of positive culture likely being contaminate in absence of infective symptoms. Patient verbalized understanding.

## 2020-09-17 MED ORDER — TESTOSTERONE 1 % (25 MG/2.5 GRAM) TRANSDERMAL GEL PACKET
PACK | Freq: Every day | TRANSDERMAL | 3 refills | 45 days | Status: CP
Start: 2020-09-17 — End: ?

## 2020-09-22 MED ORDER — TESTOSTERONE 1.62 % (20.25 MG/1.25 GRAM) TRANSDERMAL GEL PACKET
PACK | 3 refills | 0 days | Status: CP
Start: 2020-09-22 — End: ?

## 2020-09-24 ENCOUNTER — Other Ambulatory Visit: Payer: Self-pay | Admitting: Physical Medicine & Rehabilitation

## 2020-09-24 DIAGNOSIS — M545 Low back pain, unspecified: Secondary | ICD-10-CM

## 2020-09-24 DIAGNOSIS — G8929 Other chronic pain: Secondary | ICD-10-CM

## 2020-09-28 ENCOUNTER — Ambulatory Visit: Admit: 2020-09-28 | Discharge: 2020-09-29 | Payer: MEDICARE

## 2020-09-30 ENCOUNTER — Other Ambulatory Visit: Payer: Self-pay

## 2020-09-30 ENCOUNTER — Ambulatory Visit
Admission: RE | Admit: 2020-09-30 | Discharge: 2020-09-30 | Disposition: A | Discharge status: Home / Self Care | Payer: Medicare HMO | Source: Ambulatory Visit | Attending: Physical Medicine & Rehabilitation | Admitting: Physical Medicine & Rehabilitation

## 2020-09-30 DIAGNOSIS — G8929 Other chronic pain: Secondary | ICD-10-CM | POA: Insufficient documentation

## 2020-09-30 DIAGNOSIS — M545 Low back pain, unspecified: Secondary | ICD-10-CM | POA: Diagnosis not present

## 2020-10-20 ENCOUNTER — Ambulatory Visit: Admit: 2020-10-20 | Discharge: 2020-10-21 | Payer: MEDICARE | Attending: Family | Primary: Family

## 2020-10-20 DIAGNOSIS — Z87442 Personal history of urinary calculi: Principal | ICD-10-CM

## 2020-10-20 DIAGNOSIS — R3129 Other microscopic hematuria: Principal | ICD-10-CM

## 2020-10-20 DIAGNOSIS — N3090 Cystitis, unspecified without hematuria: Principal | ICD-10-CM

## 2020-10-20 MED ORDER — NITROFURANTOIN MONOHYDRATE/MACROCRYSTALS 100 MG CAPSULE
ORAL_CAPSULE | Freq: Two times a day (BID) | ORAL | 1 refills | 30.00000 days | Status: CP
Start: 2020-10-20 — End: ?

## 2020-11-02 ENCOUNTER — Ambulatory Visit: Admit: 2020-11-02 | Discharge: 2020-11-03 | Payer: MEDICARE | Attending: "Endocrinology | Primary: "Endocrinology

## 2020-11-02 DIAGNOSIS — E291 Testicular hypofunction: Principal | ICD-10-CM

## 2020-11-02 DIAGNOSIS — E119 Type 2 diabetes mellitus without complications: Principal | ICD-10-CM

## 2020-11-02 MED ORDER — INSULIN HUMAN U-100 NPH-REGULR 70-30 MIX 100 UNIT/ML SUBCUTANEOUS SUSP
Freq: Two times a day (BID) | SUBCUTANEOUS | 3 refills | 59 days | Status: CP
Start: 2020-11-02 — End: 2021-11-02

## 2020-11-02 MED ORDER — TESTOSTERONE 1.62 % (20.25 MG/1.25 GRAM) TRANSDERMAL GEL PACKET
PACK | 5 refills | 0.00000 days | Status: CP
Start: 2020-11-02 — End: ?

## 2020-12-15 IMAGING — CT CT ANGIO CHEST
2 of 5 series · 17 of 46 positions shown · IV contrast (APPLIED)
Comparison: Chest radiograph April 21, 2019; CT abdomen and
pelvis August 27, 2018

CLINICAL DATA: Chest pain radiating toward back

EXAM:
CT ANGIOGRAPHY CHEST WITH CONTRAST
TECHNIQUE: Initially, axial CT images were obtained through the chest without
intravenous contrast material administration. Multidetector CT
imaging of the chest was performed using the standard protocol
during bolus administration of intravenous contrast. Multiplanar CT
image reconstructions and MIPs were obtained to evaluate the
vascular anatomy.
CONTRAST:  75mL OMNIPAQUE IOHEXOL 350 MG/ML SOLN

[Series 6: axial arterial · axial · arterial · 0.74mm/px · z∈[-809,-491]mm · 14 of 120 slices shown]
[im 7/120  lung]
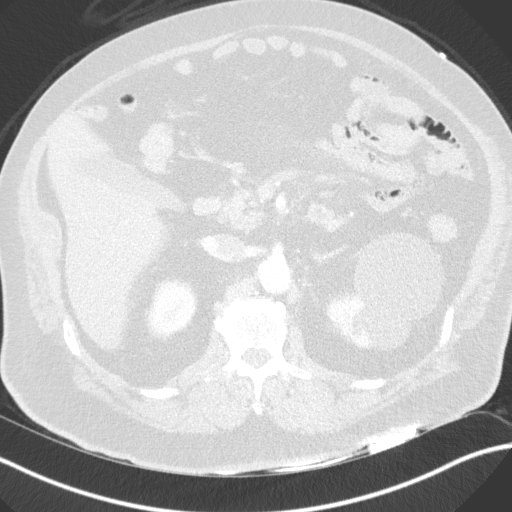
[im 13/120  soft-tissue]
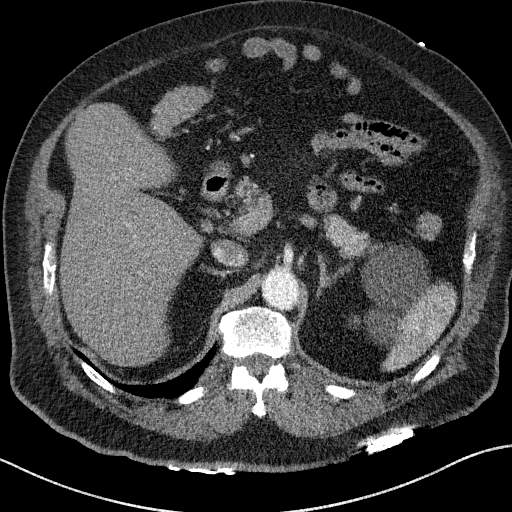
[im 26/120  lung]
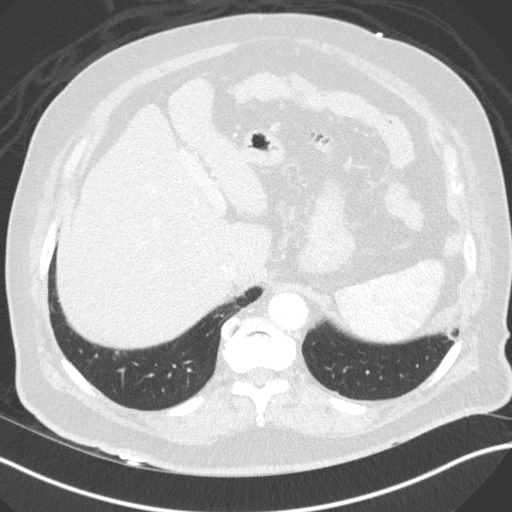
[im 32/120  soft-tissue]
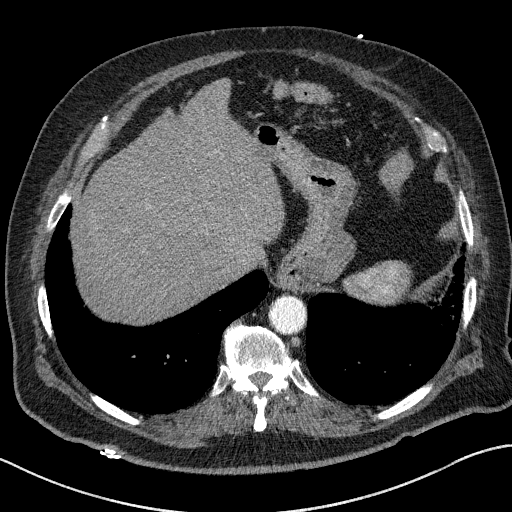
[im 38/120  lung]
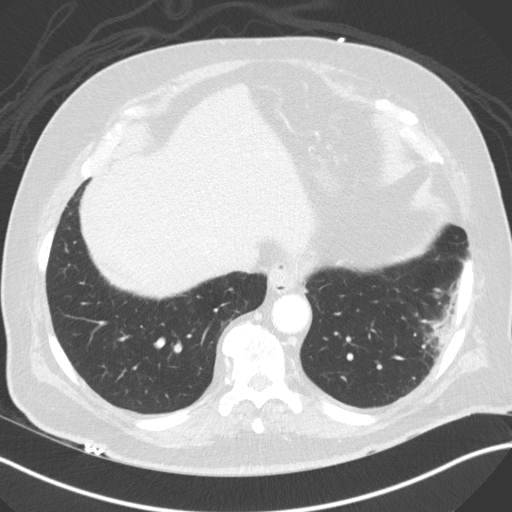
[im 51/120  soft-tissue]
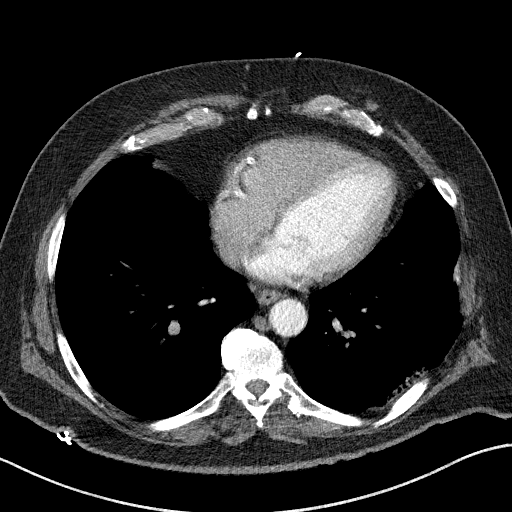
[im 57/120  lung]
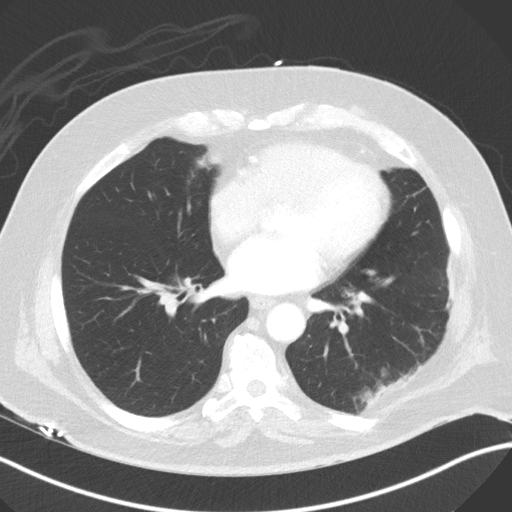
[im 63/120  soft-tissue]
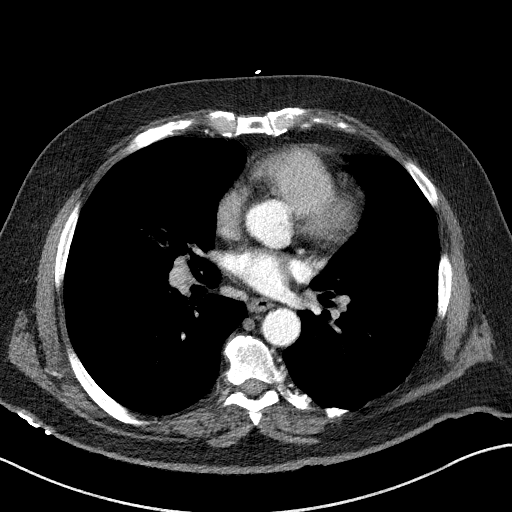
[im 69/120  lung]
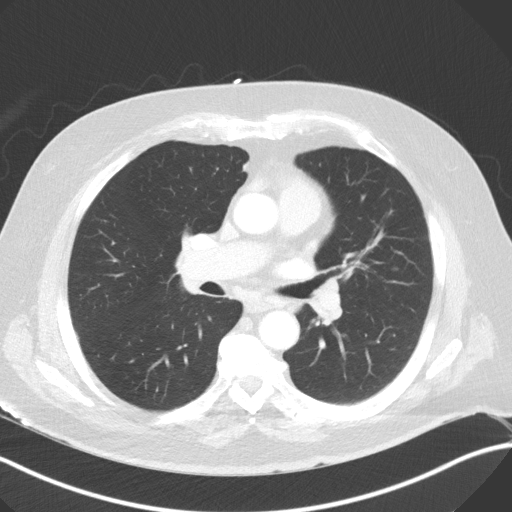
[im 82/120  soft-tissue]
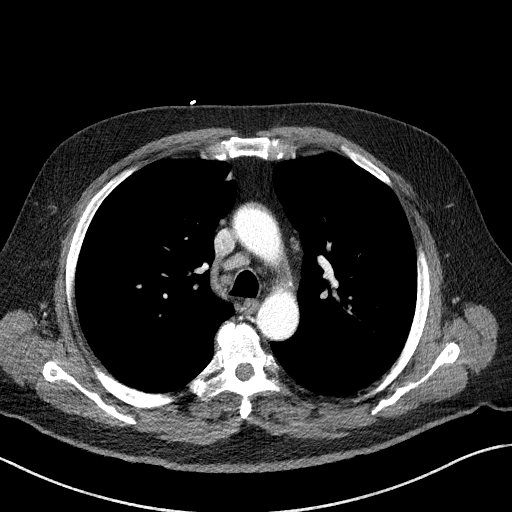
[im 88/120  lung]
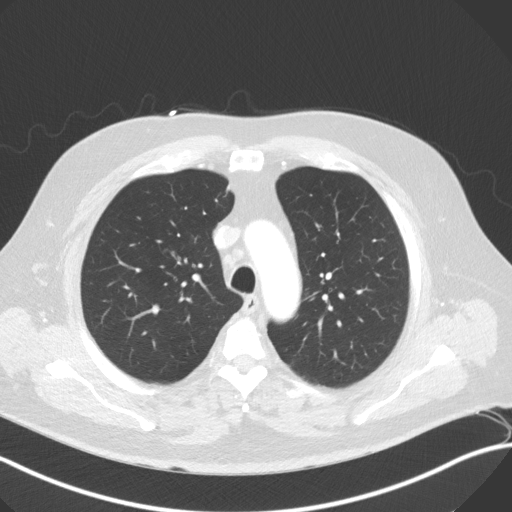
[im 94/120  soft-tissue]
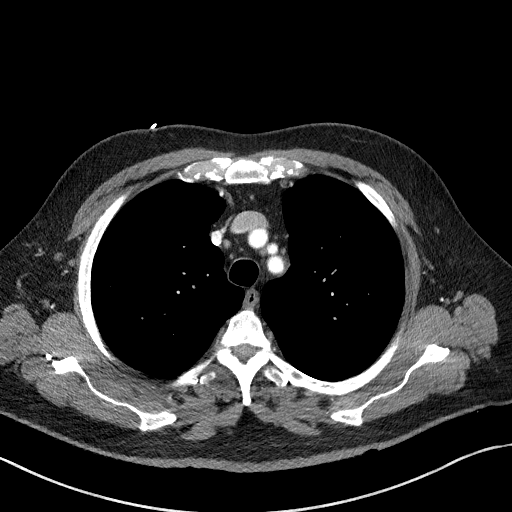
[im 107/120  lung]
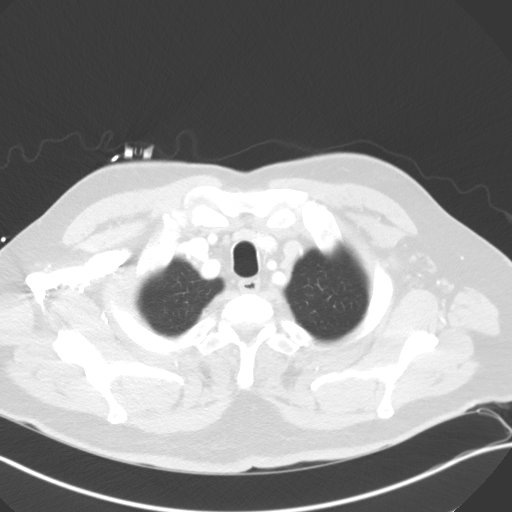
[im 113/120  soft-tissue]
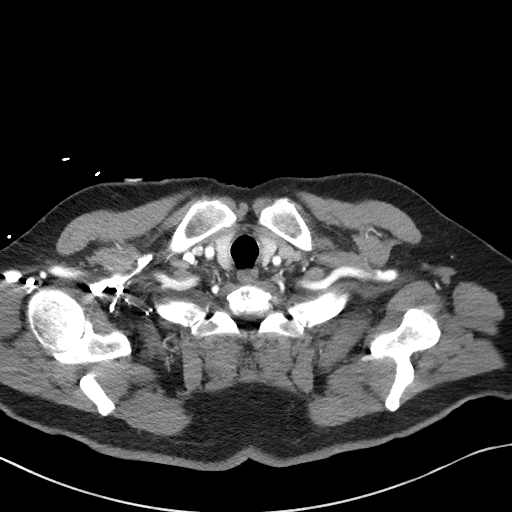

[Series 9: coronals · coronal · 0.71mm/px · 3 of 150 slices shown]
[im 38/150  soft-tissue]
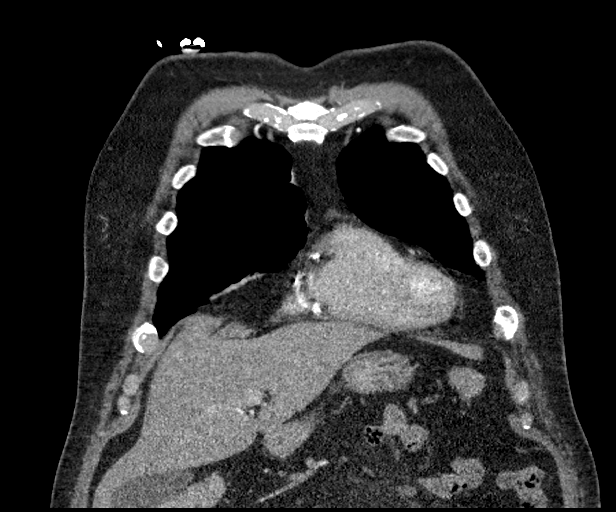
[im 75/150  soft-tissue]
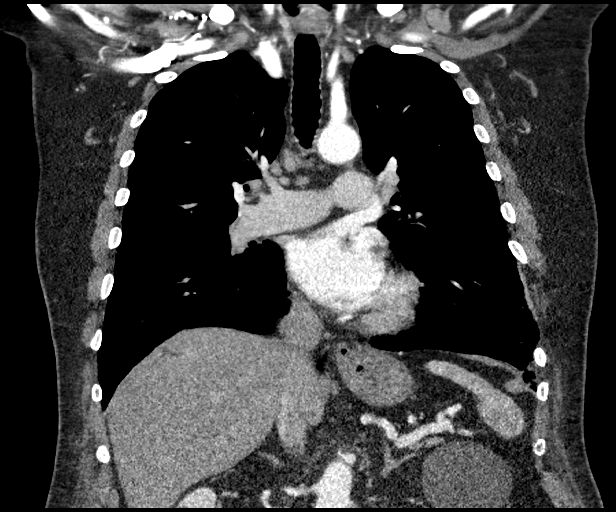
[im 112/150  soft-tissue]
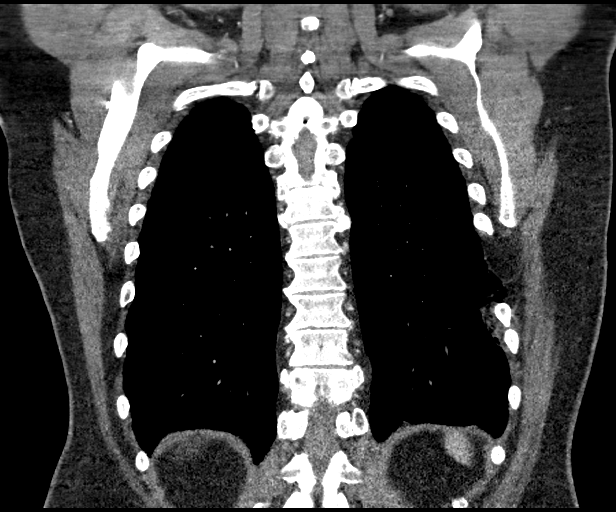

[17 of 46 positions shown; findings below may reference images not displayed]

FINDINGS: Cardiovascular: There is no demonstrable intramural hematoma on
noncontrast enhanced study. There is no appreciable thoracic aortic
aneurysm or dissection. Visualized great vessels appear
unremarkable. No evident mediastinal hematoma. No pulmonary embolus
evident. No pericardial effusion or pericardial thickening. There
are multiple foci of coronary artery calcification.

Mediastinum/Nodes: Thyroid appears normal. There are scattered
subcentimeter mediastinal lymph nodes. There is a right pretracheal
lymph node measuring 0.3 x 1.0 cm. There is a lymph node to the left
of the carina measuring 1.1 x 1.0 cm. There is an aortopulmonary
window region lymph node measuring 1.0 x 1.0 cm. There is a
subcarinal lymph node measuring 1.1 x 1.0 cm. No esophageal lesions
are evident.

Lungs/Pleura: There is scarring and atelectatic change in the
periphery of the left lower lobe. There is also mild atelectatic
change in the inferior lingula and right middle lobe. There is no
frank edema or consolidation. No pleural effusions are evident. No
pneumothorax.

Upper Abdomen: There are cysts arising from the upper pole of each
kidney. The largest cyst is seen arising from the upper pole left
kidney measuring 7.0 x 6.8 cm. There is incomplete visualization of
apparent mesenteric sclerosis in the left mid abdomen slightly to
the left of midline without associated visualized bowel or vascular
displacement. Visualized upper abdominal structures otherwise appear
unremarkable.

Musculoskeletal: There is degenerative change in the lower thoracic
spine. There are no blastic or lytic bone lesions. No chest wall
lesions are evident. There is evidence of old rib trauma on the
left.

Review of the MIP images confirms the above findings.
IMPRESSION: 1. No thoracic aortic aneurysm or dissection. No intramural hematoma
evident.

2.  No demonstrable pulmonary embolus.

3. Scarring and atelectatic change left lower lobe. Note old rib
trauma in this area. No edema or consolidation evident.

4.  Several mildly prominent lymph nodes of uncertain etiology.

5. Incomplete visualization of mesenteric sclerosis slightly to the
left of midline in the mid abdomen. Suspect a degree of sclerosing
mesenteritis. Note that this finding was present on previous CT
abdomen and pelvis.

6.  Multiple foci of coronary artery calcification.

## 2020-12-27 ENCOUNTER — Ambulatory Visit: Admit: 2020-12-27 | Discharge: 2020-12-28 | Payer: MEDICARE | Attending: Internal Medicine | Primary: Internal Medicine

## 2020-12-27 DIAGNOSIS — E782 Mixed hyperlipidemia: Principal | ICD-10-CM

## 2020-12-27 DIAGNOSIS — K219 Gastro-esophageal reflux disease without esophagitis: Principal | ICD-10-CM

## 2020-12-27 DIAGNOSIS — E785 Hyperlipidemia, unspecified: Principal | ICD-10-CM

## 2020-12-27 DIAGNOSIS — I2111 ST elevation (STEMI) myocardial infarction involving right coronary artery: Principal | ICD-10-CM

## 2020-12-27 DIAGNOSIS — Z8551 Personal history of malignant neoplasm of bladder: Principal | ICD-10-CM

## 2020-12-27 DIAGNOSIS — Z794 Long term (current) use of insulin: Principal | ICD-10-CM

## 2020-12-27 DIAGNOSIS — Z7952 Long term (current) use of systemic steroids: Principal | ICD-10-CM

## 2020-12-27 DIAGNOSIS — J410 Simple chronic bronchitis: Principal | ICD-10-CM

## 2020-12-27 DIAGNOSIS — E236 Other disorders of pituitary gland: Principal | ICD-10-CM

## 2020-12-27 DIAGNOSIS — E1140 Type 2 diabetes mellitus with diabetic neuropathy, unspecified: Principal | ICD-10-CM

## 2020-12-27 DIAGNOSIS — E119 Type 2 diabetes mellitus without complications: Principal | ICD-10-CM

## 2020-12-27 DIAGNOSIS — M539 Dorsopathy, unspecified: Principal | ICD-10-CM

## 2020-12-27 DIAGNOSIS — M5030 Other cervical disc degeneration, unspecified cervical region: Principal | ICD-10-CM

## 2020-12-27 DIAGNOSIS — L409 Psoriasis, unspecified: Principal | ICD-10-CM

## 2020-12-27 DIAGNOSIS — E669 Obesity, unspecified: Principal | ICD-10-CM

## 2020-12-27 DIAGNOSIS — N401 Enlarged prostate with lower urinary tract symptoms: Principal | ICD-10-CM

## 2020-12-27 DIAGNOSIS — E1142 Type 2 diabetes mellitus with diabetic polyneuropathy: Principal | ICD-10-CM

## 2020-12-27 DIAGNOSIS — R350 Frequency of micturition: Principal | ICD-10-CM

## 2020-12-27 DIAGNOSIS — H9190 Unspecified hearing loss, unspecified ear: Principal | ICD-10-CM

## 2020-12-27 DIAGNOSIS — J45909 Unspecified asthma, uncomplicated: Principal | ICD-10-CM

## 2020-12-27 DIAGNOSIS — H903 Sensorineural hearing loss, bilateral: Principal | ICD-10-CM

## 2020-12-27 DIAGNOSIS — N500 Atrophy of testis: Principal | ICD-10-CM

## 2020-12-27 DIAGNOSIS — N200 Calculus of kidney: Principal | ICD-10-CM

## 2020-12-27 MED ORDER — EZETIMIBE 10 MG TABLET
ORAL_TABLET | Freq: Every day | ORAL | 3 refills | 90 days | Status: CP
Start: 2020-12-27 — End: 2021-12-27

## 2020-12-27 MED ORDER — PREGABALIN 25 MG CAPSULE
ORAL_CAPSULE | 5 refills | 0 days | Status: CP
Start: 2020-12-27 — End: ?

## 2020-12-27 MED ORDER — BUDESONIDE-FORMOTEROL HFA 160 MCG-4.5 MCG/ACTUATION AEROSOL INHALER
Freq: Two times a day (BID) | RESPIRATORY_TRACT | 3 refills | 9 days | Status: CP
Start: 2020-12-27 — End: ?

## 2020-12-27 MED ORDER — INSULIN HUMAN U-100 NPH-REGULR 70-30 MIX 100 UNIT/ML SUBCUTANEOUS SUSP
Freq: Two times a day (BID) | SUBCUTANEOUS | 3 refills | 56 days
Start: 2020-12-27 — End: 2021-12-27

## 2020-12-27 MED ORDER — ALBUTEROL SULFATE HFA 90 MCG/ACTUATION AEROSOL INHALER
Freq: Four times a day (QID) | RESPIRATORY_TRACT | 3 refills | 0.00000 days | Status: CP | PRN
Start: 2020-12-27 — End: 2021-12-27

## 2020-12-27 MED ORDER — ACETAMINOPHEN 500 MG TABLET
ORAL_TABLET | Freq: Two times a day (BID) | ORAL | 2 refills | 50.00000 days
Start: 2020-12-27 — End: 2021-12-27

## 2021-01-04 ENCOUNTER — Ambulatory Visit: Payer: Medicare HMO | Admitting: Urology

## 2021-01-04 ENCOUNTER — Other Ambulatory Visit: Payer: Self-pay

## 2021-01-04 VITALS — BP 161/97 | HR 73 | Ht 70.0 in | Wt 220.0 lb

## 2021-01-04 DIAGNOSIS — Z8551 Personal history of malignant neoplasm of bladder: Secondary | ICD-10-CM | POA: Diagnosis not present

## 2021-01-04 NOTE — Progress Notes (Signed)
   11/19/19   CC:  Chief Complaint  Patient presents with   Cysto    HPI: Dustin Blackburn is a 80 y.o. M who returns today for cysto/stent removal s/p cystoscopy w/ bladder bx, Urolift, ureteroscopy/lithotripsy, and remote history of bladder cancer who presents today for surveillance cystoscopy.  He was initially found to have incidental bladder wall thickening on CT abdomen pelvis on 03/2016. Cystoscopy showed a papillary proximal and 1 cm tumor at the dome of the bladder. He was taken to the operating room on 06/12/2016 for TURBT, bilateral retrograde, and instillation of mitomycin.   Surgical pathology was consistent with low-grade, noninvasive tumor.   He returned to the operating room on 10/02/2017 for cystoscopy, bladder biopsy, bilateral retrograde pyelograms secondary to bladder erythema.    Surgical pathology was benign with evidence of squamous metaplasia and reactive changes, scarlike fibrosis, but no evidence of malignancy or dysplasia.  He underwent another bladder biopsy for bladder erythema in 10/2019 at the time of ureteroscopy.  This was consistent with mucosal edema and mixed inflammation but no malignancy.  , Reports he is doing well.    Today's Vitals   01/04/21 1001  BP: (!) 161/97  Pulse: 73  Weight: 220 lb (99.8 kg)  Height: '5\' 10"'$  (1.778 m)   Body mass index is 31.57 kg/m. NED. A&Ox3.   No respiratory distress   Abd soft, NT, ND Normal phallus with bilateral descended testicles  Cystoscopy Procedure Note   Patient identification was confirmed, informed consent was obtained, and patient was prepped using Betadine solution.  Lidocaine jelly was administered per urethral meatus.      Pre-Procedure: - Inspection reveals a normal caliber ureteral meatus.   Procedure: The flexible cystoscope was introduced without difficulty - No urethral strictures/lesions are present. - Enlarged prostate bilobar coaptation  - Minimally elevated bladder neck -  Bilateral ureteral orifices identified - Bladder mucosa unremarkable today with a few stellate scars - No bladder stones - No trabeculation   Retroflexion shows no significant median lobe.      Post-Procedure: - Patient tolerated the procedure well  Assessment/ Plan:  1.  Bladder cancer No evidence of disease today, will continue to follow  2. Kidney stone  potassium citrate twice daily  3. BPH w/ outlet obstruction  S/p urolift   F/u 1 year for cysto   Hollice Espy, MD

## 2021-01-05 LAB — MICROSCOPIC EXAMINATION: Bacteria, UA: NONE SEEN

## 2021-01-05 LAB — URINALYSIS, COMPLETE
Bilirubin, UA: NEGATIVE
Glucose, UA: NEGATIVE
Ketones, UA: NEGATIVE
Leukocytes,UA: NEGATIVE
Nitrite, UA: NEGATIVE
Protein,UA: NEGATIVE
Specific Gravity, UA: 1.02 (ref 1.005–1.030)
Urobilinogen, Ur: 0.2 mg/dL (ref 0.2–1.0)
pH, UA: 5 (ref 5.0–7.5)

## 2021-01-10 ENCOUNTER — Ambulatory Visit: Admit: 2021-01-10 | Discharge: 2021-01-11 | Payer: MEDICARE

## 2021-01-10 DIAGNOSIS — R3129 Other microscopic hematuria: Principal | ICD-10-CM

## 2021-01-10 DIAGNOSIS — C679 Malignant neoplasm of bladder, unspecified: Principal | ICD-10-CM

## 2021-01-10 DIAGNOSIS — R399 Unspecified symptoms and signs involving the genitourinary system: Principal | ICD-10-CM

## 2021-01-10 DIAGNOSIS — Z87442 Personal history of urinary calculi: Principal | ICD-10-CM

## 2021-01-15 ENCOUNTER — Ambulatory Visit: Admit: 2021-01-15 | Discharge: 2021-01-16 | Payer: MEDICARE

## 2021-01-15 DIAGNOSIS — R3129 Other microscopic hematuria: Principal | ICD-10-CM

## 2021-01-15 DIAGNOSIS — Z87442 Personal history of urinary calculi: Principal | ICD-10-CM

## 2021-01-21 ENCOUNTER — Ambulatory Visit: Admit: 2021-01-21 | Discharge: 2021-01-22 | Payer: MEDICARE

## 2021-01-21 DIAGNOSIS — Z87442 Personal history of urinary calculi: Principal | ICD-10-CM

## 2021-01-21 MED ORDER — TAMSULOSIN 0.4 MG CAPSULE
ORAL_CAPSULE | Freq: Every day | ORAL | 0 refills | 30 days | Status: CP
Start: 2021-01-21 — End: 2021-02-20

## 2021-01-24 ENCOUNTER — Ambulatory Visit
Admit: 2021-01-24 | Discharge: 2021-01-25 | Payer: MEDICARE | Attending: Physical Medicine & Rehabilitation | Primary: Physical Medicine & Rehabilitation

## 2021-01-27 ENCOUNTER — Ambulatory Visit
Admit: 2021-01-27 | Discharge: 2021-01-28 | Payer: MEDICARE | Attending: Cardiovascular Disease | Primary: Cardiovascular Disease

## 2021-01-27 DIAGNOSIS — I2111 ST elevation (STEMI) myocardial infarction involving right coronary artery: Principal | ICD-10-CM

## 2021-01-27 DIAGNOSIS — E785 Hyperlipidemia, unspecified: Principal | ICD-10-CM

## 2021-01-27 MED ORDER — EVOLOCUMAB 140 MG/ML SUBCUTANEOUS PEN INJECTOR
SUBCUTANEOUS | 3 refills | 0.00000 days | Status: CP
Start: 2021-01-27 — End: ?

## 2021-01-28 DIAGNOSIS — Z87442 Personal history of urinary calculi: Principal | ICD-10-CM

## 2021-02-02 DIAGNOSIS — E785 Hyperlipidemia, unspecified: Principal | ICD-10-CM

## 2021-02-02 MED ORDER — ALIROCUMAB 150 MG/ML SUBCUTANEOUS PEN INJECTOR
SUBCUTANEOUS | 3 refills | 84 days | Status: CP
Start: 2021-02-02 — End: ?
  Filled 2021-02-08: qty 2, 28d supply, fill #0

## 2021-02-03 DIAGNOSIS — E785 Hyperlipidemia, unspecified: Principal | ICD-10-CM

## 2021-02-04 NOTE — Unmapped (Signed)
[facility] Specialty Medication Onboarding    Specialty Medication: PRALUENT PEN 150 mg/mL subcutaneous injection (alirocumab)  Prior Authorization: Approved   Financial Assistance: No - patient doesn't qualify for additional assistance   Final Copay/Day Supply: $141 / 75    Insurance Restrictions: None     Notes to Pharmacist: n/a    The triage team has completed the benefits investigation and has determined that the patient is able to fill this medication at [facility]. Please contact the patient to complete the onboarding or follow up with the prescribing physician as needed.

## 2021-02-07 MED ORDER — EMPTY CONTAINER
2 refills | 0 days
Start: 2021-02-07 — End: ?

## 2021-02-07 NOTE — Unmapped (Signed)
[facility]   Patient Onboarding/Medication Counseling    William Keller is a 80 y.o. male with hyperlipidemia who I am counseling today on initiation of therapy.  I am speaking to the patient.    Was a Nurse, learning disability used for this call? No    Verified patient's date of birth / HIPAA.    Specialty medication(s) to be sent: General Specialty: Praluent      Non-specialty medications/supplies to be sent: Higher education careers adviser      Medications not needed at this time: n/a         Praluent (alirocumab)    Medication & Administration     Dosage: Inject the contents of 1 pen (150mg ) under the skin every 2 weeks.    Administration: Inject under the skin of the thigh, abdomen or upper arm. Rotate sites with each injection.  ??? Injection instructions - Pen:  o Take 1 (or 2) pens out of the refrigerator and allow to stand at room temperature for 30 to 40 minutes  o Check the pen(s) for the following:  - Expiration date  - Absence of damage or cracks  - Medication is clear, colorless or pale yellow and free from particles  o Choose your injection site and clean with alcohol wipe. Allow to air dry completely.  o Pull the blue needle cap straight off and discard  o Hold the pen in your palm with your thumb over the green button, but do not touch the green button yet.  o Press the pen unto your skin at a 90 degree angle until the yellow safety cover disappears  o Push the green button and immediately release. You will hear a ???click.??? This signifies that the injection has started.  o Continue to hold the pen in place, maintaining pressure, until the entire window has turned yellow.  - This may take up to 20 seconds.  - You may hear a second ???click??? when the injection is complete.  o Lift the pen straight off your skin and dispose of it in a sharps container  o If there is blood at the injection site gently press a cotton ball or guaze to the site. Do not rub the injection site.    Adherence/Missed dose instructions: Administer a missed dose within 7 days and resume your normal schedule. If it has been more than 7 days and you inject every 2 weeks, skip the missed dose and resume your normal schedule..     Goals of Therapy     Lower cholesterol  Lower the risk of heart attack, stroke and unstable angina in people with heart disease    Side Effects & Monitoring Parameters   ??? Injection site irritation  ??? Flu-like symptoms  ??? Nose or throat irritation    The following side effects should be reported to the provider:  ??? Signs of an allergic reaction    Contraindications, Warnings, & Precautions     ??? Hypersensitivity    Drug/Food Interactions     ??? Medication list reviewed in Epic. The patient was instructed to inform the care team before taking any new medications or supplements. No drug interactions identified.     Storage, Handling Precautions, & Disposal     ??? Praluent should be stored in the refrigerator.   o If necessary Praluent may be stored at room temperature, in the original carton, for no more than 30 days.  ??? Place used devices into a sharps container for disposal  Current Medications (including OTC/herbals), Comorbidities and Allergies     Current Outpatient Medications   Medication Sig Dispense Refill   ??? acetaminophen (TYLENOL EXTRA STRENGTH) 500 MG tablet Take 1 tablet (500 mg total) by mouth two (2) times a day. May try with 1 aleve for acute pain. 100 tablet 2   ??? albuterol 2.5 mg /3 mL (0.083 %) nebulizer solution Inhale 3 mL (2.5 mg total) by nebulization every six (6) hours as needed for wheezing. 3 mL 1   ??? albuterol HFA 90 mcg/actuation inhaler Inhale 2 puffs every six (6) hours as needed for wheezing. (Patient not taking: Reported on 01/27/2021) 8 g 3   ??? alirocumab (PRALUENT) 150 mg/mL subcutaneous injection Inject the contents of one pen (150 mg total) under the skin every fourteen (14) days. 6 mL 3   ??? budesonide-formoteroL (SYMBICORT) 160-4.5 mcg/actuation inhaler Inhale 2 puffs Two (2) times a day. 6 g 3   ??? clobetasoL (TEMOVATE) 0.05 % cream Apply topically Two (2) times a day. 30 g 0   ??? ezetimibe (ZETIA) 10 mg tablet Take 1 tablet (10 mg total) by mouth in the morning. 90 tablet 3   ??? fluocinonide (LIDEX) 0.05 % external solution Apply topically Two (2) times a day. 60 mL 1   ??? fluticasone (FLONASE) 50 mcg/actuation nasal spray 2 sprays by Each Nare route daily. 16 g 11   ??? hydrocortisone (CORTEF) 5 MG tablet Take 1 tablet by mouth daily. With breakfast and lunch     ??? insulin NPH-insulin regular, 70/30, (HUMULIN/NOVOLIN) 100 unit/mL (70-30) injection Inject 0.18 mL (18 Units total) under the skin Two (2) times a day. 20 mL 3   ??? insulin syr/ndl U100 half mark 0.5 mL 31 gauge x 15/64 (6 mm) Syrg by Miscellaneous route.      ??? ketoconazole (NIZORAL) 2 % shampoo WASH SCALP 3 TIMES A WEEK. LEAVE IT IN PLACE FOR 30 MINUTES BEFORE RINSING     ??? neomycin-polymyxin-hydrocortisone (CORTISPORIN) 3.5-10,000-1 mg/mL-unit/mL-% otic solution Administer 3 drops into both ears Four (4) times a day. As needed for ear itching. 10 mL 0   ??? testosterone 1.62 % (20.25 mg/1.25 gram) GlPk Use one packet, 20.25mg  daily.  Apply to shoulder 30 packet 5     No current facility-administered medications for this visit.       Allergies   Allergen Reactions   ??? Glipizide Shortness Of Breath   ??? Actos [Pioglitazone]      Pt states it gave him a heart attack   ??? Amoxicillin      Other reaction(s): NAUSEA   ??? Crestor [Rosuvastatin] Other (See Comments)     indigestion   ??? Erythromycin Base      Other reaction(s): OTHER   ??? Esomeprazole Magnesium Other (See Comments)     Makes patients heart burn worse   ??? Esomeprazole Magnesium Other (See Comments)     Makes patients heart burn worse  Other reaction(s): Other (See Comments)  Makes patients heart burn worse  Makes patients heart burn worse  Makes patients heart burn worse     ??? Glimepiride      Other reaction(s): RASH   ??? Liraglutide Other (See Comments)     bloating   ??? Metformin Hospitalized for shortness of breath/asthma.     ??? Midazolam Other (See Comments)     Doesn't do well with after effects    ??? Sitagliptin      Other reaction(s): JOINT PAIN   ??? Atorvastatin  indigestion       Patient Active Problem List   Diagnosis   ??? Asthma   ??? Brachial neuritis   ??? Coronary atherosclerosis   ??? Calculus of kidney   ??? Sinusitis, chronic   ??? Chronic airway obstruction (CMS-HCC)   ??? Type 2 diabetes mellitus with diabetic polyneuropathy, with long-term current use of insulin (CMS-HCC)   ??? Esophageal reflux   ??? Hypogonadism   ??? Obesity   ??? Psoriasis and similar disorder   ??? Hyperlipemia   ??? 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 Sensory hearing loss, bilateral   ??? Abnormal findings on diagnostic imaging of lung   ??? Allergic rhinitis   ??? Arthritis   ??? Degeneration of intervertebral disc of cervical region   ??? Empty sella syndrome (CMS-HCC)   ??? Long term current use of systemic steroids   ??? Benign prostatic hyperplasia with urinary frequency   ??? History of kidney stones       Reviewed and up to date in Epic.    Appropriateness of Therapy     Acute infections noted within Epic:  No active infections  Patient reported infection: None    Is medication and dose appropriate based on diagnosis and infection status? Yes    Prescription has been clinically reviewed: Yes      Baseline Quality of Life Assessment      How many days over the past month did your hyperlipidemia  keep you from your normal activities? For example, brushing your teeth or getting up in the morning. Patient declined to answer    Financial Information     Medication Assistance provided: Prior Authorization    Anticipated copay of $141 (84 days) reviewed with patient. Verified delivery address.    Delivery Information     Scheduled delivery date: 02/08/21    Expected start date: 02/08/21    Medication will be delivered via Same Day Courier to the prescription address in [facility].  This shipment will not require a signature.      Explained the services we provide at [facility] and that each month we would call to set up refills.  Stressed importance of returning phone calls so that we could ensure they receive their medications in time each month.  Informed patient that we should be setting up refills 7-10 days prior to when they will run out of medication.  A pharmacist will reach out to perform a clinical assessment periodically.  Informed patient that a welcome packet, containing information about our pharmacy and other support services, a Notice of Privacy Practices, and a drug information handout will be sent.      The patient or caregiver noted above participated in the development of this care plan and knows that they can request review of or adjustments to the care plan at any time.      Patient or caregiver verbalized understanding of the above information as well as how to contact the pharmacy at [phone] option 4 with any questions/concerns.  The pharmacy is open Monday through Friday 8:30am-4:30pm.  A pharmacist is available 24/7 via pager to answer any clinical questions they may have.    Patient Specific Needs     - Does the patient have any physical, cognitive, or cultural barriers? No    - Does the patient have adequate living arrangements? (i.e. the ability to store and take their medication appropriately) Yes    - Did you  identify any home environmental safety or security hazards? No    - Patient prefers to have medications discussed with  Patient     - Is the patient or caregiver able to read and understand education materials at a high school level or above? Yes    - Patient's primary language is  English     - Is the patient high risk? No    - Does the patient require physician intervention or other additional services (i.e. dietary/nutrition, smoking cessation, social work)? No      Camillo Flaming  [facility] Specialty Pharmacist

## 2021-02-08 MED FILL — EMPTY CONTAINER: 120 days supply | Qty: 1 | Fill #0

## 2021-02-09 ENCOUNTER — Ambulatory Visit: Admit: 2021-02-09 | Discharge: 2021-02-10 | Payer: MEDICARE

## 2021-02-09 DIAGNOSIS — Z87442 Personal history of urinary calculi: Principal | ICD-10-CM

## 2021-02-09 DIAGNOSIS — J410 Simple chronic bronchitis: Principal | ICD-10-CM

## 2021-02-09 DIAGNOSIS — E782 Mixed hyperlipidemia: Principal | ICD-10-CM

## 2021-02-09 DIAGNOSIS — Z794 Long term (current) use of insulin: Principal | ICD-10-CM

## 2021-02-09 DIAGNOSIS — I2111 ST elevation (STEMI) myocardial infarction involving right coronary artery: Principal | ICD-10-CM

## 2021-02-09 DIAGNOSIS — E1142 Type 2 diabetes mellitus with diabetic polyneuropathy: Principal | ICD-10-CM

## 2021-02-09 LAB — CREATININE
CREATININE: 0.83 mg/dL
EGFR CKD-EPI (2021) MALE: 90 mL/min/{1.73_m2} (ref >=60)

## 2021-02-09 LAB — HEMOGLOBIN: HEMOGLOBIN: 14.3 g/dL (ref 12.9–16.5)

## 2021-02-09 NOTE — Unmapped (Addendum)
Per Anesthesia's guidelines:    Please take the following medications the morning of your procedure with a sip of water:    Tylenol if needed  Cortef (hydrocortisone)  Albuterol if needed  Symbicort if needed    Do not take insulin on the day of surgery.  Use half of your nighttime dose the night before surgery.

## 2021-02-14 DIAGNOSIS — Z87442 Personal history of urinary calculi: Principal | ICD-10-CM

## 2021-02-14 NOTE — Unmapped (Signed)
Urologic Surgery History and Physical Note      Assessment   William Keller is a pleasant 80 y.o. male scheduled to undergo Right PCNL on 02/21/2021 with Dr. Doreatha Lew     Plan:  - NPO status to be confirmed by anesthesia.  - Risk of the surgery will be discussed in the pre-op holding area and informed consent will be obtained accordingly.  - Vital signs and exam to be performed in pre-operative holding.   - Pending above actions, will proceed to OR as planned      History of Present Illness:   Pt with history of right nephrolithiasis presenting for right PCNL. Please see clinic not on 01/21/2021 for further details.     Allergies  He is allergic to glipizide, actos [pioglitazone], crestor [rosuvastatin], esomeprazole magnesium, esomeprazole magnesium, liraglutide, metformin, midazolam, sitagliptin, amoxicillin, atorvastatin, erythromycin base, and glimepiride.    Medications    Reviewed in EMR.     Past Medical History, Past Surgical History, Social History, and Family History:  Reviewed in EMR.     Physical Exam:  Vital signs and physical exam to be obtained in pre-operative holding.     Labs and Studies  Labs:    Urine Culture, Comprehensive   Date Value Ref Range Status   01/10/2021 NO GROWTH  Final      Imaging: No new interval studies.

## 2021-02-14 NOTE — Unmapped (Signed)
Urology Preoperative Phone Note:    Left a message for William Keller about upcoming surgery.    COVID TEST ORDERED.     Medications:  Does not take anticoagulation medication.     Cardiac Clearance:  - Obtained, please see clinic note on 01/27/2021.     Urine Culture:    Urine Culture, Comprehensive   Date Value Ref Range Status   01/10/2021 NO GROWTH  Final        Intraop abx: amp/gent    General Instructions:  - Reminded about n.p.o. status prior to the procedure. He was instructed to call the clinic with any further issues between now and surgery.  - Reminded that they would receive a phone call from [facility] the day prior to surgery regarding their time of arrival    Outstanding items:  Has COVID test ordered, pt with Hg + Cr labs done at pre-op appointment.

## 2021-02-18 ENCOUNTER — Ambulatory Visit: Admit: 2021-02-18 | Discharge: 2021-02-18 | Payer: MEDICARE | Attending: "Endocrinology | Primary: "Endocrinology

## 2021-02-18 ENCOUNTER — Ambulatory Visit: Admit: 2021-02-18 | Discharge: 2021-02-18 | Payer: MEDICARE

## 2021-02-18 DIAGNOSIS — Z01818 Encounter for other preprocedural examination: Principal | ICD-10-CM

## 2021-02-18 DIAGNOSIS — E291 Testicular hypofunction: Principal | ICD-10-CM

## 2021-02-18 LAB — TESTOSTERONE: TESTOSTERONE TOTAL: 652 ng/dL

## 2021-02-18 MED ORDER — TESTOSTERONE 1.62 % (20.25 MG/1.25 GRAM) TRANSDERMAL GEL PACKET
PACK | 1 refills | 0.00000 days
Start: 2021-02-18 — End: ?

## 2021-02-18 NOTE — Unmapped (Unsigned)
Diabetes/endocrine follow-up:    Assessment/Plan:  Patient is a 80 y.o. male with a history of CAD, hypogonadism, COPD, diabetes type 2 on insulin who presents for f/u of hypogonadism/diabetes    Hypogonadism- History of hypogonadism with high LH/FSH suggesting testicular etiology.  He had MRI pituitary that showed empty sella, which is most often an incidental finding.    -- tolerating T gel 25mg  daily apply topically.  --With plan to increase based on level of under 300, but he told me today that he is missing some doses.  Given possible LUTS and advanced age, will continue current dose and assess level next visit.    Type 2 diabetes- A1C of 8.2% recently.  Given advanced age, goal A1C ~7.5% given risk of hypoglycemia.    -- not on adjuncts due to allergy/ intolerance.   -- Knows what to do with insulin pre op (half dose before)         Obesity- did not address today.  Weight stable.    No follow-ups on file.    Donzetta Starch MD  [facility]  Phone:  [phone]   Fax:  [phone]    ----------------------------  Reason For Visit: Fatigue     Subjective:     History of Present Illness:  [name] is a 80 y.o. male with a h/o MI, DM2, hypogonadism who I am seeing in follow up for hypogonadism and diabetes.    Endocrine history:  He was started on testosterone by a holistic doctor: testosterone sublingual for a year 27mg /ml, was on this for several years. He has ED, and reports fatigue. He has been researching online and asked about testing for testosterone so this was checked and he was referred to endocrinology at [facility]. 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    For his hypogonadism he has two children, two girls. He had ED which started in his early 74s. Workup revealed a testosterone of 1.67-3 (total) with elevated LH at 15 and FSH 12. He does have a history of testicular injury in the past.     Interval History:  He has been using testosterone gel.  Last applied last night.  The issue is cost.  Wants to try 90 day supply.  Surgery for kidney stone on Monday.  Has not started repatha.         Has been taking 18u of 70/30 NPH/Reg insulin daily.      No meter today.  He recalls 130-150 in the morning.  No low blood sugars.  Has been cutting back on potato and bread.  This seems to be helping.      Hard to assess energy due to kidney stones.    Hgb 14.3.                 PMH:    Past Medical History:   Diagnosis Date   ??? Arthritis    ??? Asthma    ??? Cancer (CMS-HCC)    ??? Diabetes mellitus (CMS-HCC)    ??? Hearing impairment     hearing aids   ??? Hyperlipidemia    ??? Kidney stone    ??? Myocardial infarction (CMS-HCC)    ??? Peripheral neuropathy    ??? Visual impairment     cataract sx 05/27/20   Bladder cancer treated with surgery    Allergies:  Allergies   Allergen Reactions   ???  Glipizide Shortness Of Breath   ??? Actos [Pioglitazone]      Pt states it gave him a heart attack   ??? Crestor [Rosuvastatin] Other (See Comments)     indigestion   ??? Esomeprazole Magnesium Other (See Comments)     Makes patients heart burn worse   ??? Esomeprazole Magnesium Other (See Comments)     Makes patients heart burn worse  Other reaction(s): Other (See Comments)  Makes patients heart burn worse  Makes patients heart burn worse  Makes patients heart burn worse     ??? Liraglutide Other (See Comments)     bloating   ??? Metformin      Hospitalized for shortness of breath/asthma.     ??? Midazolam Other (See Comments)     Doesn't do well with after effects    ??? Sitagliptin      Other reaction(s): JOINT PAIN   ??? Amoxicillin Nausea Only     Other reaction(s): NAUSEA   ??? Atorvastatin      indigestion   ??? Erythromycin Base Nausea Only     Other reaction(s): OTHER   ??? Glimepiride Rash     Other reaction(s): RASH       Medications:  Current Outpatient Medications   Medication Sig Dispense Refill   ??? acetaminophen (TYLENOL EXTRA STRENGTH) 500 MG tablet Take 1 tablet (500 mg total) by mouth two (2) times a day. May try with 1 aleve for acute pain. 100 tablet 2   ??? albuterol HFA 90 mcg/actuation inhaler Inhale 2 puffs every six (6) hours as needed for wheezing. 8 g 3   ??? alirocumab (PRALUENT) 150 mg/mL subcutaneous injection Inject the contents of one pen (150 mg total) under the skin every fourteen (14) days. 6 mL 3   ??? ASPIRIN LOW DOSE ORAL Take by mouth.     ??? budesonide-formoteroL (SYMBICORT) 160-4.5 mcg/actuation inhaler Inhale 2 puffs Two (2) times a day. (Patient taking differently: Inhale 2 puffs two (2) times a day as needed.) 6 g 3   ??? clobetasoL (TEMOVATE) 0.05 % cream Apply topically Two (2) times a day. 30 g 0   ??? empty container (SHARPS-A-GATOR DISPOSAL SYSTEM) Misc Use as directed for sharps disposal 1 each 2   ??? ezetimibe (ZETIA) 10 mg tablet Take 1 tablet (10 mg total) by mouth in the morning. 90 tablet 3   ??? fluocinonide (LIDEX) 0.05 % external solution Apply topically Two (2) times a day. 60 mL 1   ??? fluticasone (FLONASE) 50 mcg/actuation nasal spray 2 sprays by Each Nare route daily. (Patient taking differently: 2 sprays into each nostril daily as needed.) 16 g 11   ??? hydrocortisone (CORTEF) 5 MG tablet Take 1 tablet by mouth daily. With breakfast and lunch     ??? insulin NPH-insulin regular, 70/30, (HUMULIN/NOVOLIN) 100 unit/mL (70-30) injection Inject 0.18 mL (18 Units total) under the skin Two (2) times a day. 20 mL 3   ??? insulin syr/ndl U100 half mark 0.5 mL 31 gauge x 15/64 (6 mm) Syrg by Miscellaneous route.      ??? ketoconazole (NIZORAL) 2 % shampoo WASH SCALP 3 TIMES A WEEK. LEAVE IT IN PLACE FOR 30 MINUTES BEFORE RINSING     ??? neomycin-polymyxin-hydrocortisone (CORTISPORIN) 3.5-10,000-1 mg/mL-unit/mL-% otic solution Administer 3 drops into both ears Four (4) times a day. As needed for ear itching. 10 mL 0   ??? testosterone 1.62 % (20.25 mg/1.25 gram) GlPk Use one packet, 20.25mg  daily.  Apply to shoulder 30  packet 5   ??? albuterol injection Inject 0.18 mL (18 Units total) under the skin Two (2) times a day. 20 mL 3   ??? insulin syr/ndl U100 half mark 0.5 mL 31 gauge x 15/64 (6 mm) Syrg by Miscellaneous route.      ??? ketoconazole (NIZORAL) 2 % shampoo WASH SCALP 3 TIMES A WEEK. LEAVE IT IN PLACE FOR 30 MINUTES BEFORE RINSING     ??? neomycin-polymyxin-hydrocortisone (CORTISPORIN) 3.5-10,000-1 mg/mL-unit/mL-% otic solution Administer 3 drops into both ears Four (4) times a day. As needed for ear itching. 10 mL 0   ??? testosterone 1.62 % (20.25 mg/1.25 gram) GlPk Use one packet, 20.25mg  daily.  Apply to shoulder 30 packet 5   ??? albuterol 2.5 mg /3 mL (0.083 %) nebulizer solution Inhale 3 mL (2.5 mg total) by nebulization every six (6) hours as needed for wheezing. 3 mL 1     No current facility-administered medications for this visit.   Several supplements    Social History:   Social History     Tobacco Use   ??? Smoking status: Former Smoker     Packs/day: 1.00     Years: 20.00     Pack years: 20.00     Types: Cigarettes   ??? Smokeless tobacco: Former Neurosurgeon     Quit date: 1992   ??? Tobacco comment: 30 years ago   Vaping Use   ??? Vaping Use: Never used   Substance Use Topics   ??? Alcohol use: Not Currently     Comment: rarely    ??? Drug use: No         Family History:    Family History   Problem Relation Age of Onset   ??? Cancer Mother         throat cancer   ??? Emphysema Father    ??? Melanoma Neg Hx    ??? Basal cell carcinoma Neg Hx    ??? Squamous cell carcinoma Neg Hx        Objective:     Physical Exam:  BP (P) 126/69  - Pulse (P) 70  - Ht 175.3 cm (5' 9)  - Wt (!) 101.6 kg (224 lb)  - BMI 33.08 kg/m??   Wt Readings from Last 3 Encounters:   02/18/21 (!) 101.6 kg (224 lb)   02/09/21 (!) 101.5 kg (223 lb 12.8 oz)   01/27/21 (!) 103.3 kg (227 lb 12.8 oz)     Constitutional - alert, well appearing, no acute distress  GI- central obesity  Psych - normal affect  Feet-normal sensation throughout.      Data Review:  Lab Results   Component Value Date    A1C 8.2 (H) Range: 179 - 756 ng/dL 161 (L) 096 (H) 0454 (H) 735 414 470  833 (H) 946 (H)    Testosterone, Free Latest Ref Range: 3.08 - 11.3 ng/dL       0.98 (L)   1.19 (L)   Testosterone (Mayo) Latest Ref Range: 240 - 950 ng/dL       147 (L)   69 (L)         12/07/16 free testosterone 1.67, total 76, LH 13 at [facility]  10/25/2016 free testosterone 3.28, total 117 LH 11 at [facility]  Ferritin 287 12/07/2016  06/22/17 free testosterone 3, total 107 LH 15 FSH 12    Prolactin 06/22/17 10  IGF-1 103    10/25/2016 ACTH 22, cortisol 12.7  No signs/symptoms of adrenal insufficiency, no weight  loss. He does have fatigue but in the setting of taking cortisone, inhaled steroids, steroid creams.     10/25/2016 TSH 1.04 free T4 0.74    PSA 09/2015 0.60

## 2021-02-18 NOTE — Unmapped (Signed)
Continue the same insulin doses.    I will let you know if we need to increase the testosterone.    Donzetta Starch MD  Assistant Professor of Medicine  [facility] Division of Endocrinology and Metabolism  Phone: [phone]    Fax: [phone]

## 2021-02-18 NOTE — Unmapped (Unsigned)
No meter or pump downloaded. POC glucose done today. PP 6:30am. 119 mg/dL.

## 2021-02-21 ENCOUNTER — Ambulatory Visit: Admit: 2021-02-21 | Discharge: 2021-02-21 | Payer: MEDICARE

## 2021-02-21 ENCOUNTER — Encounter: Admit: 2021-02-21 | Discharge: 2021-02-21 | Payer: MEDICARE | Attending: Anesthesiology | Primary: Anesthesiology

## 2021-02-21 DIAGNOSIS — Z87442 Personal history of urinary calculi: Principal | ICD-10-CM

## 2021-02-21 MED ORDER — TAMSULOSIN 0.4 MG CAPSULE
ORAL_CAPSULE | Freq: Every day | ORAL | 0 refills | 7 days | Status: CP
Start: 2021-02-21 — End: 2021-02-28

## 2021-02-21 MED ORDER — OXYCODONE 5 MG TABLET
ORAL_TABLET | ORAL | 0 refills | 2 days | Status: CP | PRN
Start: 2021-02-21 — End: 2021-02-26

## 2021-02-21 MED ORDER — CIPROFLOXACIN 500 MG TABLET
ORAL_TABLET | Freq: Once | ORAL | 0 refills | 1.00000 days | Status: CP
Start: 2021-02-21 — End: 2021-02-21

## 2021-02-21 MED ADMIN — albuterol (PROVENTIL HFA;VENTOLIN HFA) 90 mcg/actuation inhaler: RESPIRATORY_TRACT | @ 14:00:00 | Stop: 2021-02-21

## 2021-02-21 MED ADMIN — sodium chloride irrigation (NS) 0.9 % irrigation solution: @ 16:00:00 | Stop: 2021-02-21

## 2021-02-21 MED ADMIN — lidocaine (XYLOCAINE) 20 mg/mL (2 %) injection: INTRAVENOUS | @ 14:00:00 | Stop: 2021-02-21

## 2021-02-21 MED ADMIN — ampicillin (OMNIPEN) 2 g in sodium chloride 0.9 % (NS) 100 mL IVPB-connector bag: 2 g | INTRAVENOUS | @ 14:00:00 | Stop: 2021-02-21

## 2021-02-21 MED ADMIN — glycopyrrolate (ROBINUL) injection: INTRAVENOUS | @ 17:00:00 | Stop: 2021-02-21

## 2021-02-21 MED ADMIN — iohexoL (OMNIPAQUE) 300 mg iodine/mL solution: @ 17:00:00 | Stop: 2021-02-21

## 2021-02-21 MED ADMIN — furosemide (LASIX) injection: INTRAVENOUS | @ 17:00:00 | Stop: 2021-02-21

## 2021-02-21 MED ADMIN — VECuronium (NORCURON) injection: INTRAVENOUS | @ 14:00:00 | Stop: 2021-02-21

## 2021-02-21 MED ADMIN — VECuronium (NORCURON) injection: INTRAVENOUS | @ 16:00:00 | Stop: 2021-02-21

## 2021-02-21 MED ADMIN — propofoL (DIPRIVAN) injection: INTRAVENOUS | @ 14:00:00 | Stop: 2021-02-21

## 2021-02-21 MED ADMIN — EPINEPHrine (ADRENALIN) injection: INTRAMUSCULAR | @ 15:00:00 | Stop: 2021-02-21

## 2021-02-21 MED ADMIN — acetaminophen (OFIRMEV) 10 mg/mL injection: INTRAVENOUS | @ 17:00:00 | Stop: 2021-02-21

## 2021-02-21 MED ADMIN — hydrocortisone sod succ (Solu-CORTEF) injection: INTRAVENOUS | @ 14:00:00 | Stop: 2021-02-21

## 2021-02-21 MED ADMIN — lactated Ringers infusion: INTRAVENOUS | @ 14:00:00 | Stop: 2021-02-21

## 2021-02-21 MED ADMIN — neostigmine (BLOXIVERZ) injection: INTRAVENOUS | @ 17:00:00 | Stop: 2021-02-21

## 2021-02-21 MED ADMIN — sodium chloride irrigation (NS) 0.9 % irrigation solution: @ 15:00:00 | Stop: 2021-02-21

## 2021-02-21 MED ADMIN — albuterol (PROVENTIL HFA;VENTOLIN HFA) 90 mcg/actuation inhaler: RESPIRATORY_TRACT | @ 15:00:00 | Stop: 2021-02-21

## 2021-02-21 MED ADMIN — thrombin (recombinant) topical: TOPICAL | @ 17:00:00 | Stop: 2021-02-21

## 2021-02-21 MED ADMIN — sodium chloride irrigation (NS) 0.9 % irrigation solution: @ 17:00:00 | Stop: 2021-02-21

## 2021-02-21 MED ADMIN — propofoL (DIPRIVAN) injection: INTRAVENOUS | @ 15:00:00 | Stop: 2021-02-21

## 2021-02-21 MED ADMIN — phenylephrine 0.8 mg/10 mL (80 mcg/mL) injection: INTRAVENOUS | @ 15:00:00 | Stop: 2021-02-21

## 2021-02-21 MED ADMIN — bupivacaine (PF) (MARCAINE) 0.25 % (2.5 mg/mL) 30 mL, lidocaine (XYLOCAINE) 10 mg/mL (1 %) 30 mL: @ 17:00:00 | Stop: 2021-02-21

## 2021-02-21 MED ADMIN — gelatin absorbable (GELFOAM) 100 sponge: TOPICAL | @ 16:00:00 | Stop: 2021-02-21

## 2021-02-21 MED ADMIN — ePHEDrine (PF) 25 mg/5 mL (5 mg/mL) in 0.9% sodium chloride syringe Syrg: INTRAVENOUS | @ 15:00:00 | Stop: 2021-02-21

## 2021-02-21 MED ADMIN — succinylcholine (ANECTINE) injection: INTRAVENOUS | @ 14:00:00 | Stop: 2021-02-21

## 2021-02-21 MED ADMIN — gentamicin (GARAMYCIN) 420 mg in sodium chloride (NS) 0.9 % 100 mL IVPB: 420 mg | INTRAVENOUS | @ 14:00:00 | Stop: 2021-02-21

## 2021-02-21 MED ADMIN — lactated Ringers infusion: 10 mL/h | INTRAVENOUS | @ 12:00:00 | Stop: 2021-02-21

## 2021-02-21 MED ADMIN — fentaNYL (PF) (SUBLIMAZE) injection: INTRAVENOUS | @ 16:00:00 | Stop: 2021-02-21

## 2021-02-21 MED ADMIN — iohexoL (OMNIPAQUE) 300 mg iodine/mL solution: @ 16:00:00 | Stop: 2021-02-21

## 2021-02-21 MED ADMIN — fentaNYL (PF) (SUBLIMAZE) injection: INTRAVENOUS | @ 14:00:00 | Stop: 2021-02-21

## 2021-02-21 MED ADMIN — ePHEDrine (PF) 25 mg/5 mL (5 mg/mL) in 0.9% sodium chloride syringe Syrg: INTRAVENOUS | @ 14:00:00 | Stop: 2021-02-21

## 2021-02-21 MED ADMIN — ondansetron (ZOFRAN) injection: INTRAVENOUS | @ 17:00:00 | Stop: 2021-02-21

## 2021-02-21 MED ADMIN — VECuronium (NORCURON) injection: INTRAVENOUS | @ 15:00:00 | Stop: 2021-02-21

## 2021-02-21 MED ADMIN — oxyCODONE (ROXICODONE) immediate release tablet 5 mg: 5 mg | ORAL | @ 18:00:00 | Stop: 2021-02-21

## 2021-02-21 NOTE — Unmapped (Signed)
Sent my chart message with dates and provided contact information

## 2021-02-21 NOTE — Unmapped (Signed)
He is request for staged contralateral left miniature PCNL in the supine position.  We will wait approximately 4 to 5 weeks to perform the surgery to allow him time to recover from his most recent procedure.    Urology Surgery Scheduling Checklist    Procedure: Left miniature PCNL, supine  Timing: 4-5 weeks  Attending: Dr. Dow Adolph   Urology Pre-op:  No  Pre Anesthesia ([facility]):  No  Labs:  None  Urine:  Obtained previously/today - No repeat needed  Testing Plan:  No additional testing  Blood thinners:  None and ASA (81) - do not stop  Cardiac/Anticoagulation Clearance: Other - Obtained for recent surgery; no repeat evaluation indicated.  COVID Test:  Ordered  Post Op Appointment(s): Will schedule day of surgery in 6-8 weeks  Special requests: None  Other:  None  Case request:  Yes - Ordered + checklist in comments        [name], MD MPH  Assistant Professor  Department of Urology   [facility]  [id number]  [place], [place] [address]  p [phone] f [phone]

## 2021-02-21 NOTE — Unmapped (Signed)
PREOPERATIVE DIAGNOSIS: right renal calculi collectively greater than 2 cm.     POSTOPERATIVE DIAGNOSIS:      PROCEDURE PERFORMED:  1. right percutaneous nephrostolithotomy (SUPINE) > 2 cm  2. right renal tract access and dilation with supervision and interpretation.  3. right retrograde stent placement.  4. Renal Ultrasonography, limited  5. Supervision and interpretation of pyelograms and ureteral stent placement.  6.  Paravertebral rib block, right  7.  Percutaneous renal cyst drainage    Implants:   Implant Name Type Inv. Item Serial No. Manufacturer Lot No. LRB No. Used Action   STENT URETERAL [id number] CONTOUR - [id number] Stent STENT URETERAL [id number] CONTOUR  [employer] [id number] Right 1 Implanted       Specimens:    Order Name Source Comment Collection Info Order Time   STONE ANALYSIS Kidney, Right Pre-op diagnosis:  RIGHT flank pain Collected By: Vickii Penna, MD 02/21/2021  1:05 PM   AEROBIC/ANAEROBIC CULTURE Kidney, Right Pre-op diagnosis:  RIGHT flank pain Collected By: Vickii Penna, MD 02/21/2021  1:06 PM   CYTOLOGY - EXAM FROM OPTIME Kidney, Right Pre-op diagnosis:  RIGHT flank pain Collected By: Vickii Penna, MD 02/21/2021 12:47 PM        Antibiotics: Ampicillin and gentamicin    Complications: None    SURGEON/STAFF: [name], MD MPH     ASSISTANT SURGEON:  Judson Roch, MD      ANESTHESIA: General.     INDICATIONS: Patient is a 80 y.o. male with large right renal calculi collectively greater than 2 cm.  Of note he also has polycystic kidney disease and was informed that he may require percutaneous cyst drainage of the obstructed R percutaneous tract into the lower pole.. Management options and their Risks/benefits were discussed and patient elected to proceed with the abovementioned procedure.      FINDINGS:   Multiple perinephric cysts including a large parapelvic cyst made access into the collecting system difficult and we therefore elected to perform percutaneous cyst aspiration of at least 3 cysts, the fluid of which was sent for cytology.  We were eventually able to achieve lower pole access.  All stones seen endoscopically and fluoroscopically appeared to have been removed. An indwelling stent was left in place, which will be removed in 5 days via string.     PROCEDURE: The patient had a stone greater than 2 cm and qualified resident not avail.  After informed consent was obtained, the patient was taken to the operating room, and a general anesthetic was induced. Patient was placed modified supine and prepped in both flank and perinuem. A flexible cystoscope was used to place 12/14Fr access sheath over wire into the right ureter. Ureteroscope passed up into renal pelvis to identify ideal calyx.    Using curvilear renal ultrasound probe the kidney was identified. Fluoroscopic images were captured.  Ultrasound showed normal renal length and width as well as hyperechoic structures with posterior shadowing consistent with stones.  Additionally, several perinephric cysts are identified and appear to be obstructing our path into the lower pole of the kidney and we therefore elected to perform percutaneous aspiration of at least 3 separate renal cysts.  This fluid was sent for cytology.  A hydronephrotic right lower pole calyx was identified and 18 gauge needle was passed under ultrasound guidance into the calyx and urine was aspirated.     A safety wire was then passed into the collecting system. A 72fr teflon sheath then was used to  dilate renal tract under fluoroscopy  and 5Fr vascular catheter was used to negotiate the wire down the ureter. A second wire was placed down the ureter with 8/10Fr co-axial catheter. The incision was enlarged to 1 cm. A 30-French NephroMax balloon was then utilized to dilate the tract under fluoroscopy. The sheath was then passed over this, and the balloon was exchanged for rigid nephroscope.     Using a lithotripter, the stone was then fragmented and removed. A flexible ureteroscope was passed retrograde up ureter to inspect the ureter and some stones were removed.  Otherwise pan pyeloscopy revealed that all stones have been removed.  Gentle retrograde pyelogram was performed revealing normal anatomy and no extravasation of contrast fluid.    Over a wire that was placed up the ureter, a 6 x 26 cm double-J stent was then passed in an retrograde fashion. A good curl was seen inside the kidney and the bladder after removing the wire. Thrombin-soaked Gelfoam was placed into the tract.  A 1:1 mixture of 1% lidocaine and 0.5% bupivacaine with epinephrine was used to perform a rib block and anesthetize the wound edges. 4-0 Monocryl were used to close the skin. The wound was cleaned and dried. A dressing was placed over the incision. The patient tolerated the procedure well.  Steri-Strips and Tegaderm were used to secure the ureteral stent string to the shaft of the penis.     ESTIMATED BLOOD LOSS: 20 mL.     TOTAL FLUIDS GIVEN: see anesthetic record    DISPOSITION: Extubated and transferred to recovery.    Please note I, Dr Dow Adolph, was present and scrubbed for all key aspects of the procedure. Plan to remove the stent in 5 days via string.  We will then plan to perform his left supine miniature PCNL in approximately 4 to 5 weeks to allow for recovery.  We also will schedule a CT stone protocol approximately 2 weeks after stent removal to assess for any residual stone burden.    [name], MD MPH  Assistant Professor  Department of Urology   [facility]  [id number]  [place], [place] [address]  p [phone] f [phone]

## 2021-02-23 NOTE — Unmapped (Signed)
Triage Note       Patient: William Keller                              JJ:[id number]       Reason for call:  not feeling well.      Time call returned: 11:41 AM       Phone Assessment:   gas/ bloated/ has not had much of a BM, temp normal/ urine is dark/ drinking water makes him more bloated. No clots, no fever, no chills.  Not eating much.has been sleeping sitting up in chair      Triage Recommendations: has adjustable bed. Suggested try lying on left side with right leg bent prop pillow up under knee. Stool softeners bid. Senna daily/ may try MOM x 1 dose.   Flat gingerale, no gassy food or drink  Call back this afternoon/      Patient Response:  Appreciative of call       Outstanding tasks: nothing at this time      Patient Pharmacy has been verified and primary pharmacy has been marked as preferred.  Verified      Elvina Sidle, LPN  Triage Nurse  [facility]

## 2021-02-24 NOTE — Unmapped (Signed)
Received page to Pulmonology Consult Emergency pager for [name] regarding post-urological procedure fatigue and hematuria. Information re-routed to overnight Urology resident:  From: William Keller, William Keller DOB May 23, 2041 sp uro sx w/ fatigue, continued hematuria. Got forwarded to [facility] Emergency pager somehow. Please call pt and wife. Their number is [phone]    Olene Craven A. Alejandro Mulling, MD  PGY4, Pulmonary and Critical Care Medicine

## 2021-02-24 NOTE — Unmapped (Signed)
Spoke with patient he will cb when he is ready to schedule 2nd procedure

## 2021-02-24 NOTE — Unmapped (Signed)
Called lvm with contact information

## 2021-02-24 NOTE — Unmapped (Signed)
Returned patient call and spoke with both wife and patient.     Briefly, patient s/p PCNL on 02/21/2021 with Dr. Dow Adolph. Pt has reported bloating and abdominal distension over the past day. Denies feeling nauseous, but feels like he is not able to drink or eat much because it makes him feel bloated.     Pt reports peeing regularly, still seeing a small amount of blood in urine. Has remained afebrile, no vomiting, no sharp abdominal pain.     Reassured patient and family, instructed them to continue conservative management. Encouraged pt to continue taking bowel regimen given narcotic use and staying as hydrated as possible. Also recommended trying simethicone for gassy discomfort.     Recommended returning to ED for any new fevers, N/V with inability to stay hydrated with oral intake, inability to void, or any other concerning symptoms.     All questions answered by end of call.     Roby Lofts, MD  PGY2 Urologic Surgery  [facility]

## 2021-02-28 NOTE — Unmapped (Signed)
Still passing some tea colored urine. No fcnv. Some clots small fingernail size.  Had BM with MOM last week and nausea  Improved after he stopped the oxycodone, Never wants to take it again.    Hydrating with 80+ fl oz water.   Will continue.   Going to the beach in 2 weeks.     Reassured.  Appreciative of call      Elvina Sidle, LPN

## 2021-03-02 NOTE — Unmapped (Signed)
[facility] Clinical Intervention    Type of intervention: Medication on-hold    Medication involved: Praluent 150 mg/mL    Intervention performed: Called Mr William Keller for assessment following Praluent onboarding on 9/12. Mr William Keller reports he has not yet started the medication. Per his provider's recommendation, he decided to hold medication start until after a couple of surgical procedures. He has had the first procedure done already and is waiting to hear about date for second procedure. He is planning to start Praluent after that time. I told Mr William Keller I would check in with him next month but encouraged him to call [facility] if he needs anything before then. He voices understanding.    Follow-up needed: Will call back next month for update and assessment    Approximate time spent: 10-15 minutes    Clinical evidence used to support intervention: Professional judgement    William Keller   [facility] Specialty Pharmacist

## 2021-04-10 ENCOUNTER — Ambulatory Visit: Admit: 2021-04-10 | Discharge: 2021-04-10 | Disposition: A | Discharge status: Home / Self Care | Payer: MEDICARE

## 2021-04-10 ENCOUNTER — Emergency Department: Admit: 2021-04-10 | Discharge: 2021-04-10 | Disposition: A | Discharge status: Home / Self Care | Payer: MEDICARE

## 2021-04-10 DIAGNOSIS — R109 Unspecified abdominal pain: Principal | ICD-10-CM

## 2021-04-10 DIAGNOSIS — N281 Cyst of kidney, acquired: Principal | ICD-10-CM

## 2021-04-10 LAB — CBC W/ AUTO DIFF
BASOPHILS ABSOLUTE COUNT: 0 10*9/L (ref 0.0–0.1)
BASOPHILS RELATIVE PERCENT: 0.5 %
EOSINOPHILS ABSOLUTE COUNT: 0.2 10*9/L (ref 0.0–0.5)
EOSINOPHILS RELATIVE PERCENT: 3.9 %
HEMATOCRIT: 40.4 % (ref 39.0–48.0)
HEMOGLOBIN: 13.5 g/dL (ref 12.9–16.5)
LYMPHOCYTES ABSOLUTE COUNT: 1.2 10*9/L (ref 1.1–3.6)
LYMPHOCYTES RELATIVE PERCENT: 22.7 %
MEAN CORPUSCULAR HEMOGLOBIN CONC: 33.4 g/dL (ref 32.0–36.0)
MEAN CORPUSCULAR HEMOGLOBIN: 30.7 pg (ref 25.9–32.4)
MEAN CORPUSCULAR VOLUME: 92.2 fL (ref 77.6–95.7)
MEAN PLATELET VOLUME: 9.8 fL (ref 6.8–10.7)
MONOCYTES ABSOLUTE COUNT: 0.5 10*9/L (ref 0.3–0.8)
MONOCYTES RELATIVE PERCENT: 9 %
NEUTROPHILS ABSOLUTE COUNT: 3.4 10*9/L (ref 1.8–7.8)
NEUTROPHILS RELATIVE PERCENT: 63.9 %
NUCLEATED RED BLOOD CELLS: 0 /100{WBCs} (ref <=4)
PLATELET COUNT: 192 10*9/L (ref 150–450)
RED BLOOD CELL COUNT: 4.38 10*12/L (ref 4.26–5.60)
RED CELL DISTRIBUTION WIDTH: 13 % (ref 12.2–15.2)
WBC ADJUSTED: 5.3 10*9/L (ref 3.6–11.2)

## 2021-04-10 LAB — COMPREHENSIVE METABOLIC PANEL
ALBUMIN: 4.3 g/dL (ref 3.4–5.0)
ALKALINE PHOSPHATASE: 68 U/L (ref 46–116)
ALT (SGPT): 20 U/L (ref 10–49)
ANION GAP: 6 mmol/L (ref 5–14)
AST (SGOT): 31 U/L (ref <=34)
BILIRUBIN TOTAL: 0.5 mg/dL (ref 0.3–1.2)
BLOOD UREA NITROGEN: 20 mg/dL (ref 9–23)
BUN / CREAT RATIO: 23
CALCIUM: 9.3 mg/dL (ref 8.7–10.4)
CHLORIDE: 104 mmol/L (ref 98–107)
CO2: 27.8 mmol/L (ref 20.0–31.0)
CREATININE: 0.88 mg/dL
EGFR CKD-EPI (2021) MALE: 89 mL/min/{1.73_m2} (ref >=60)
GLUCOSE RANDOM: 175 mg/dL (ref 70–179)
POTASSIUM: 4.8 mmol/L (ref 3.4–4.8)
PROTEIN TOTAL: 7.5 g/dL (ref 5.7–8.2)
SODIUM: 138 mmol/L (ref 135–145)

## 2021-04-10 LAB — URINALYSIS WITH CULTURE REFLEX
BILIRUBIN UA: NEGATIVE
GLUCOSE UA: NEGATIVE
HYALINE CASTS: 1 /LPF (ref 0–1)
KETONES UA: NEGATIVE
LEUKOCYTE ESTERASE UA: NEGATIVE
NITRITE UA: NEGATIVE
PH UA: 5 (ref 5.0–9.0)
PROTEIN UA: NEGATIVE
RBC UA: 4 /HPF — ABNORMAL HIGH (ref <=3)
SPECIFIC GRAVITY UA: 1.006 (ref 1.003–1.030)
SQUAMOUS EPITHELIAL: <1 /HPF (ref 0–5)
UROBILINOGEN UA: <2
WBC UA: <1 /HPF (ref <=2)

## 2021-04-10 MED ADMIN — ketorolac (TORADOL) injection 15 mg: 15 mg | INTRAVENOUS | @ 16:00:00 | Stop: 2021-04-10

## 2021-04-10 MED ADMIN — acetaminophen (TYLENOL) tablet 650 mg: 650 mg | ORAL | @ 16:00:00 | Stop: 2021-04-10

## 2021-04-10 MED ADMIN — methocarbamoL (ROBAXIN) tablet 500 mg: 500 mg | ORAL | @ 16:00:00 | Stop: 2021-04-10

## 2021-04-10 NOTE — Unmapped (Addendum)
[facility] Emergency Department Provider Note    Patient Name: William Keller  Chief Complaint: Flank Pain       ED Clinical Impression     Final diagnoses:   Right flank pain (Primary)   Renal cyst   Nephrolithiasis        Impression, ED Course, Assessment and Plan   MDM  Dywane Peruski is a 80 y.o. male with a PMH of T2DM, MI, peripheral neuropathy, kidney stone, arthritis, HLD, and bladder cancer, s/p transuretheral resection in 2018 presenting for evaluation of 2 months of worsening, constant, right-sided flank pain after undergoing standard supine PCNL on 02/21/21.    Initial vital signs are WNL in triage. On exam, patient is nontoxic appearing and in NAD. External exam of right flank without erythema, warmth, or lesions. No CVA tenderness bilaterally. No midline tenderness in cervical, thoracic, lumbar spine without step-offs, bony deformities.    Differential diagnosis includes acute nephrolithiasis, musculoskeletal back pain, acute pyelonephritis.    Plan for basic labs, UA and US renal. Will treat patient with Tylenol, Toradol, and Robaxin    I have reviewed the vital signs and the nursing notes. Labs and radiology results that were available during my care of the patient were independently reviewed by me and considered in my medical decision making.     I reviewed the patient's prior medical records.  I independently visualized the radiology images.       ED Course:     11:00 AM  Basic labs unremarkable. UA notes for moderate blood.     2:04 PM  Ultrasound renal shows no evidence of hydronephrosis.  Redemonstration of bilateral renal cysts and renal calculi.  Given that urinalysis shows no evidence of acute urinary tract infection, no plan for further advanced imaging.  Upon reevaluation, patient reports mild improvement in symptoms. Patient remains afebrile, in NAD. Patient also reports that symptoms are constant, nonmigratory, and exacerbated by picking things off the floor.  Symptoms may stem from musculoskeletal strain versus known history of nephrolithiasis.  However, given no evidence of hydronephrosis, urinary tract infection, we believe patient stable discharge with close follow-up with urology services.  Strict return precaution discussed with patient, patient conveys understanding.           Disposition:   The patient was discharged home in stable condition. The patient will follow up with PCP and urologist.        History   History obtained from: Patient     William Keller is a 80 y.o. male with a PMH of T2DM, MI, peripheral neuropathy, kidney stone, arthritis, HLD, and bladder cancer, s/p transuretheral resection in 2018 presenting for evaluation of flank pain. The patient reports 2 months of worsening, constant, right-sided flank pain after undergoing standard supine PCNL on 02/21/21. He further endorses urinary frequency. His pain is exacerbated with weight lifting where it progresses to a sharp pain. He states that his pain today is not similar to his kidney stone pain. He has not taken medication for his pain. The patient is still urinating with no changes to color. He further denies any dysuria, hematuria, chest pain, shortness of breath, fevers, cough, SOB, congestion, rhinorrhea, pleuritic chest pain     Past Medical/Past Surgical History:   Reviewed in EHR including nursing documentation as outlined. Pertinent PMH/PSH also noted above in HPI.   Past Medical History:   Diagnosis Date   ??? Arthritis    ??? Asthma    ??? Cancer (CMS-HCC)    ???  Diabetes mellitus (CMS-HCC)    ??? Hearing impairment     hearing aids   ??? Hyperlipidemia    ??? Kidney stone    ??? Myocardial infarction (CMS-HCC)    ??? Peripheral neuropathy    ??? Visual impairment     cataract sx 05/27/20     Patient Active Problem List   Diagnosis   ??? Asthma   ??? Brachial neuritis   ??? Coronary atherosclerosis   ??? Calculus of kidney   ??? Sinusitis, chronic   ??? Chronic airway obstruction (CMS-HCC)   ??? Type 2 diabetes mellitus with diabetic polyneuropathy, with long-term current use of insulin (CMS-HCC)   ??? Esophageal reflux   ??? Hypogonadism   ??? Obesity   ??? Psoriasis and similar disorder   ??? Hyperlipemia   ??? 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 Sensory hearing loss, bilateral   ??? Abnormal findings on diagnostic imaging of lung   ??? Allergic rhinitis   ??? Arthritis   ??? Degeneration of intervertebral disc of cervical region   ??? Empty sella syndrome (CMS-HCC)   ??? Long term current use of systemic steroids   ??? Benign prostatic hyperplasia with urinary frequency   ??? History of kidney stones     Past Surgical History:   Procedure Laterality Date   ??? CATARACT EXTRACTION     ??? CATARACT EXTRACTION, BILATERAL     ??? CHG ULTRASONIC GUIDANCE, INTRAOPERATIVE Right 02/21/2021    Procedure: ULTRASONIC GUIDANCE, INTRAOPERATIVE;  Surgeon: Vickii Penna, MD;  [facility];  Service: Urology   ??? esophaus rap surgery     ??? PR CATH PLACE/CORON ANGIO, IMG SUPER/INTERP,W LEFT HEART VENTRICULOGRAPHY N/A 03/31/2015    Procedure: Left Heart Catheterization;  Surgeon: Joanie Coddington, MD;  Location: [facility] CATH;  Service: Cardiology   ??? PR COLONOSCOPY FLX DX W/COLLJ SPEC WHEN PFRMD N/A 08/29/2019    Procedure: COLONOSCOPY, FLEXIBLE, PROXIMAL TO SPLENIC FLEXURE; DIAGNOSTIC, W/WO COLLECTION SPECIMEN BY BRUSH OR WASH;  Surgeon: Leland Her, MD;  Location: [facility] PROCEDURES [facility];  Service: Gastroenterology   ??? PR PERCUT DRAIN/INJECT RENAL CYST Right 02/21/2021    Procedure: ASPIRATION AND/OR INJECTION OF RENAL CYST OR PELVIS BY NEEDLE, PERCUTANEOUS;  Surgeon: Vickii Penna, MD;  [facility];  Service: Urology   ??? PR PERCUT REMV KID STONE,2+ CM Right 02/21/2021    Procedure: supine standard PCNL; need fortec;  Surgeon: Vickii Penna, MD;  [facility];  Service: Urology   ??? PR PVB THORACIC SINGLE INJECTION SITE W/IMG GID Right 02/21/2021    Procedure: PARAVERTEBRAL BLOCK (PVB) (PARASPINOUS BLOCK), THORACIC; SINGLE INJECTION SITE (INCLUDES IMAGING GUIDANCE, WHEN PERFORMED);  Surgeon: Vickii Penna, MD;  [facility] OR [facility];  Service: Urology   ??? PR UPPER GI ENDOSCOPY,BIOPSY N/A 08/29/2019    Procedure: UGI ENDOSCOPY; WITH BIOPSY, SINGLE OR MULTIPLE;  Surgeon: Leland Her, MD;  Location: [facility];  Service: Gastroenterology   ??? removal for fatty tumors     ??? SINUS SURGERY     ??? SINUS SURGERY     ??? SKIN BIOPSY     ??? urolift         Social History/Family History:   Reviewed in EMR, I agree with nursing documentation. Additional pertinent social and family history noted in HPI.   Social History     Tobacco Use   ??? Smoking status: Former  Packs/day: 1.00     Years: 20.00     Pack years: 20.00     Types: Cigarettes   ??? Smokeless tobacco: Former     Quit date: 1992   ??? Tobacco comments:     30 years ago   Vaping Use   ??? Vaping Use: Never used   Substance Use Topics   ??? Alcohol use: Not Currently     Comment: rarely    ??? Drug use: No     Family History   Problem Relation Age of Onset   ??? Cancer Mother         throat cancer   ??? Emphysema Father    ??? Melanoma Neg Hx    ??? Basal cell carcinoma Neg Hx    ??? Squamous cell carcinoma Neg Hx        ROS  A review of systems reviewed and are negative except as stated in the HPI or noted below.   Constitutional; Positive for subjective fevers  Eyes: Neg for vision changes. Neg for blurry vision.   Ears, nose, mouth, throat: Neg for sore throat  Cardiovascular: Neg for chest pain.   Respiratory: Neg for cough. Neg for shortness of breath.   Gastrointestinal: Positive for abdominal pain and right-sided flank pain. Neg for nausea or vomiting.   Genitourinary: Positive for urinary frequency. Neg for dysuria. Neg for hematuria  Musculoskeletal: Neg for joint pain. Neg for swelling   Skin: Neg for rashes  Neurological: Neg for focal numbness or weakness    Home Medications:  No current facility-administered medications for this encounter.    Current Outpatient Medications:   ???  acetaminophen (TYLENOL EXTRA STRENGTH) 500 MG tablet, Take 1 tablet (500 mg total) by mouth two (2) times a day. May try with 1 aleve for acute pain., Disp: 100 tablet, Rfl: 2  ???  albuterol 2.5 mg /3 mL (0.083 %) nebulizer solution, Inhale 3 mL (2.5 mg total) by nebulization every six (6) hours as needed for wheezing., Disp: 3 mL, Rfl: 1  ???  albuterol HFA 90 mcg/actuation inhaler, Inhale 2 puffs every six (6) hours as needed for wheezing., Disp: 8 g, Rfl: 3  ???  alirocumab (PRALUENT) 150 mg/mL subcutaneous injection, Inject the contents of one pen (150 mg total) under the skin every fourteen (14) days., Disp: 6 mL, Rfl: 3  ???  ASPIRIN LOW DOSE ORAL, Take by mouth., Disp: , Rfl:   ???  budesonide-formoteroL (SYMBICORT) 160-4.5 mcg/actuation inhaler, Inhale 2 puffs Two (2) times a day. (Patient taking differently: Inhale 2 puffs two (2) times a day as needed.), Disp: 6 g, Rfl: 3  ???  clobetasoL (TEMOVATE) 0.05 % cream, Apply topically Two (2) times a day., Disp: 30 g, Rfl: 0  ???  empty container (SHARPS-A-GATOR DISPOSAL SYSTEM) Misc, Use as directed for sharps disposal, Disp: 1 each, Rfl: 2  ???  ezetimibe (ZETIA) 10 mg tablet, Take 1 tablet (10 mg total) by mouth in the morning., Disp: 90 tablet, Rfl: 3  ???  fluocinonide (LIDEX) 0.05 % external solution, Apply topically Two (2) times a day., Disp: 60 mL, Rfl: 1  ???  fluticasone (FLONASE) 50 mcg/actuation nasal spray, 2 sprays by Each Nare route daily. (Patient taking differently: 2 sprays into each nostril daily as needed.), Disp: 16 g, Rfl: 11  ???  hydrocortisone (CORTEF) 5 MG tablet, Take 1 tablet by mouth daily. With breakfast and lunch, Disp: , Rfl:   ???  insulin NPH-insulin regular, 70/30, (HUMULIN/NOVOLIN)  100 unit/mL (70-30) injection, Inject 0.18 mL (18 Units total) under the skin Two (2) times a day., Disp: 20 mL, Rfl: 3  ???  insulin syr/ndl U100 half mark 0.5 mL 31 gauge x 15/64 (6 mm) Syrg, by Miscellaneous route. , Disp: , Rfl:   ???  ketoconazole (NIZORAL) 2 % shampoo, WASH SCALP 3 TIMES A WEEK. LEAVE IT IN PLACE FOR 30 MINUTES BEFORE RINSING, Disp: , Rfl:   ???  neomycin-polymyxin-hydrocortisone (CORTISPORIN) 3.5-10,000-1 mg/mL-unit/mL-% otic solution, Administer 3 drops into both ears Four (4) times a day. As needed for ear itching., Disp: 10 mL, Rfl: 0  ???  testosterone 1.62 % (20.25 mg/1.25 gram) GlPk, Use one packet, 20.25mg  daily.  Apply to shoulder, Disp: 30 packet, Rfl: 5    ALLERGIES:   Glipizide, Actos [pioglitazone], Crestor [rosuvastatin], Esomeprazole magnesium, Esomeprazole magnesium, Liraglutide, Metformin, Midazolam, Sitagliptin, Amoxicillin, Atorvastatin, Erythromycin base, Glimepiride, and Oxycodone       Physical Exam     ED Triage Vitals [04/10/21 0841]   Enc Vitals Group      BP 156/90      Heart Rate 78      SpO2 Pulse       Resp 16      Temp 36.5 ??C (97.7 ??F)      Temp src       SpO2 99 %      Weight       Height       Head Circumference       Peak Flow       Pain Score       Pain Loc       Pain Edu?       Excl. in GC?        Vital signs reviewed.  GEN: AOx3. Well-nourished, well-developed. No apparent distress. Speaking in full sentences.  EYES: EOM grossly intact. Pupils round and reactive. No scleral icterus. Clear conjunctiva.  HEAD/NECK /MOUTH: [place]/AT. No pharyngeal erythema. Moist mucous membranes.  CARDIO: RRR. No peripheral edema. Normal capillary refill.  LUNG: CTAB. No retractions or accessory muscle use  GI: Non-tender. Non-distended. No masses, rigidity, guarding or rebound.  MSK: Full range of motion. Moving all extremities. No swelling or deformity.  SKIN: Intact. No rashes. No lesions. No hematomas, no ecchymosis, no petechiae.  NEURO: AOx3. No facial asymmetry; no focal motor or sensory deficits.  PSYCH: Awake and alert. Appropriate mood and affect.      LABS AND ED TREATMENT:   Lab reviewed and interpreted by me as above in MDM.   Medications given in the ED:   Medications   acetaminophen (TYLENOL) tablet 650 mg (650 mg Oral Given 04/10/21 1112)   ketorolac (TORADOL) injection 15 mg (15 mg Intravenous Given 04/10/21 1112)   methocarbamoL (ROBAXIN) tablet 500 mg (500 mg Oral Given 04/10/21 1112)          Radiology     US Renal Complete   Final Result   -No hydronephrosis.   -Bilateral renal cysts and renal calculi, seen on prior CT.   -Partially visualized echogenic liver suggesting hepatic steatosis.      ED POCUS Renal (Limited)    (Results Pending)       I independently visualized the radiology images.     EKG   N/A     Procedures   N/A     Portions of this record have been created using Scientist, clinical (histocompatibility and immunogenetics). Dictation errors have been sought, but may not have been identified  and corrected.       Documentation assistance was provided by Vela Prose, Scribe on April 10, 2021 at 9:16 AM for Sabino Gasser, MD.              Sabino Gasser, MD  04/11/21 1122       Sabino Gasser, MD  04/11/21 1123

## 2021-04-10 NOTE — Unmapped (Signed)
Pt reports  pain to right kidney and lower abdomen for a while but getting progressively worse now. Denies any urinary pain.

## 2021-04-12 LAB — RHEUMATOID FACTOR, QUANT: RHEUMATOID FACTOR: <3.5 [IU]/mL (ref <14.0)

## 2021-05-02 ENCOUNTER — Ambulatory Visit: Admit: 2021-05-02 | Discharge: 2021-05-03 | Payer: MEDICARE | Attending: Internal Medicine | Primary: Internal Medicine

## 2021-05-02 DIAGNOSIS — R918 Other nonspecific abnormal finding of lung field: Principal | ICD-10-CM

## 2021-05-02 DIAGNOSIS — Z23 Encounter for immunization: Principal | ICD-10-CM

## 2021-05-02 DIAGNOSIS — J45909 Unspecified asthma, uncomplicated: Principal | ICD-10-CM

## 2021-05-02 DIAGNOSIS — I2510 Atherosclerotic heart disease of native coronary artery without angina pectoris: Principal | ICD-10-CM

## 2021-05-02 DIAGNOSIS — M791 Myalgia, unspecified site: Principal | ICD-10-CM

## 2021-05-02 DIAGNOSIS — J410 Simple chronic bronchitis: Principal | ICD-10-CM

## 2021-05-02 DIAGNOSIS — Z794 Long term (current) use of insulin: Principal | ICD-10-CM

## 2021-05-02 DIAGNOSIS — E782 Mixed hyperlipidemia: Principal | ICD-10-CM

## 2021-05-02 DIAGNOSIS — R5383 Other fatigue: Principal | ICD-10-CM

## 2021-05-02 DIAGNOSIS — E1142 Type 2 diabetes mellitus with diabetic polyneuropathy: Principal | ICD-10-CM

## 2021-05-02 DIAGNOSIS — K219 Gastro-esophageal reflux disease without esophagitis: Principal | ICD-10-CM

## 2021-05-02 DIAGNOSIS — Z7952 Long term (current) use of systemic steroids: Principal | ICD-10-CM

## 2021-05-02 DIAGNOSIS — Z87442 Personal history of urinary calculi: Principal | ICD-10-CM

## 2021-05-02 LAB — HEPATIC FUNCTION PANEL
ALBUMIN: 4.4 g/dL (ref 3.4–5.0)
ALKALINE PHOSPHATASE: 72 U/L (ref 46–116)
ALT (SGPT): 87 U/L — ABNORMAL HIGH (ref 10–49)
AST (SGOT): 48 U/L — ABNORMAL HIGH (ref <=34)
BILIRUBIN DIRECT: 0.1 mg/dL (ref 0.00–0.30)
BILIRUBIN TOTAL: 0.5 mg/dL (ref 0.3–1.2)
PROTEIN TOTAL: 7.8 g/dL (ref 5.7–8.2)

## 2021-05-02 LAB — BASIC METABOLIC PANEL
ANION GAP: 9 mmol/L (ref 5–14)
BLOOD UREA NITROGEN: 22 mg/dL (ref 9–23)
BUN / CREAT RATIO: 24
CALCIUM: 9.7 mg/dL (ref 8.7–10.4)
CHLORIDE: 102 mmol/L (ref 98–107)
CO2: 28.4 mmol/L (ref 20.0–31.0)
CREATININE: 0.92 mg/dL
EGFR CKD-EPI (2021) MALE: 85 mL/min/{1.73_m2} (ref >=60)
GLUCOSE RANDOM: 135 mg/dL (ref 70–179)
POTASSIUM: 4.5 mmol/L (ref 3.4–4.8)
SODIUM: 139 mmol/L (ref 135–145)

## 2021-05-02 LAB — CBC
HEMATOCRIT: 41.2 % (ref 39.0–48.0)
HEMOGLOBIN: 13.8 g/dL (ref 12.9–16.5)
MEAN CORPUSCULAR HEMOGLOBIN CONC: 33.5 g/dL (ref 32.0–36.0)
MEAN CORPUSCULAR HEMOGLOBIN: 30.7 pg (ref 25.9–32.4)
MEAN CORPUSCULAR VOLUME: 91.6 fL (ref 77.6–95.7)
MEAN PLATELET VOLUME: 9.1 fL (ref 6.8–10.7)
PLATELET COUNT: 188 10*9/L (ref 150–450)
RED BLOOD CELL COUNT: 4.5 10*12/L (ref 4.26–5.60)
RED CELL DISTRIBUTION WIDTH: 12.7 % (ref 12.2–15.2)
WBC ADJUSTED: 6.1 10*9/L (ref 3.6–11.2)

## 2021-05-02 LAB — HEMOGLOBIN A1C
ESTIMATED AVERAGE GLUCOSE: 180 mg/dL
HEMOGLOBIN A1C: 7.9 % — ABNORMAL HIGH (ref 4.8–5.6)

## 2021-05-02 LAB — T4, FREE: FREE T4: 1.1 ng/dL (ref 0.89–1.76)

## 2021-05-02 LAB — TSH: THYROID STIMULATING HORMONE: 0.804 u[IU]/mL (ref 0.550–4.780)

## 2021-05-02 LAB — CORTISOL: CORTISOL TOTAL: 20.4 ug/dL

## 2021-05-02 LAB — CK: CREATINE KINASE TOTAL: 210 U/L — ABNORMAL HIGH

## 2021-05-02 MED ORDER — COVID-19 AT-HOME TEST KIT
PACK | Freq: Every day | 1 refills | 0.00000 days | Status: CP | PRN
Start: 2021-05-02 — End: ?

## 2021-05-02 MED ORDER — MECLIZINE 12.5 MG TABLET
ORAL_TABLET | Freq: Three times a day (TID) | ORAL | 2 refills | 10 days | Status: CP | PRN
Start: 2021-05-02 — End: ?

## 2021-05-02 NOTE — Unmapped (Signed)
[facility] Return Visit    Assessment/Plan:     Rashee Marschall is a 80 y.o. male presenting to [facility] for follow up.    Vertigo, positional and hx of BPPV:  Has had this in the past and transient in nature.   Rx for Meclizine and gave instructions Epley maneuver.    Fatigue/low energy and muscle aches:  We started Zetia after last OV so will check CK/hepatic enzymes and consider trial off.  Will also obtain broad blood work.  He recently had Covid 19 infection and this would be residual symptoms from the viral illness.    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  He is on NPH 18 units BID. He will have outside eye exam.   ??  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 ??I encouraged adherence to Symbicort and this will help [place] wean his Cortef in the future but he has been resistant to this. I ordered PFTs and placed pulmonology referral and encouraged establishing care  ??  Hx of abnormal chest CT in [facility] in 2018:  Sounds like he had post-surgical scarring/damage after his Nissen.  Will repeat lung scan today.   ??  Long term use of glucocorticoid therapy: Remote history of holistic doctor starting him on Cortef 5 mg TID for COPD and now down to 5 mg BID. Marland Kitchen DEXA scan in 08/2020, normal bone density. Referral to pulmonologist and will discuss weaning Cortef but has been resistant to this and today very fatigued/low energy and so I do not want to decrease today.  Advised PFTs and pulm appt and in follow up would like to slowly wean  ??  CAD: Hx of RCA stent.  Could not tolerate past low-dose statin therapy. He is now on ASA 81 mg daily and zetia.  Recommended starting the PCSK9 inhibitor  ??  Elevated BP without HTN:  Likely white coat HTN, has been normotensive at home, no changes today, CTM in follow up  ??  Nephrolithiasis??& hx of lithotripsy: Latest CT shows bilateral stone burden. Underwent Right PCNL on 02/21/2021  ??  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  Hx of bladder cancer,??s/p transuretheral resection in 2018    Primary hypogonadism: ??On testosterone managed by [facility] endocrinology  ??  GERD: s/p fundiplication with continued minor symptoms and use of OTC antacids and omeprazole.   ??  Chronic low back pain: No longer taking Norco. Was followed by PM&R through [facility] and s/p L5/S1 ESI without benefit.  Gabapentin caused dizziness and imbalance.   ??  HCM: Gave flu vaccine today.??Colonoscopy normal 2021. Hearing testing completed and has hearing aids.   ??  ACP: FULL code, but does not want prolonged life support.  Surrogate decision maker is his wife Keaten Mashek  Orders Placed This Encounter   Procedures   ??? CT chest without contrast   ??? INFLUENZA QUAD ADJUVANTED 87YR UP(FLUAD)   ??? Basic metabolic panel   ??? CBC   ??? Hemoglobin A1c   ??? Cortisol   ??? CK   ??? Hepatic Function Panel   ??? TSH   ??? T4, Free   ??? Spirometry Pre / Post Bronchodilator         RTC: Return in about 3 months (around 07/31/2021).    ------------------------------------------------------------------------------------------------------------  Subjective     HPI: Blue Winther is a 80 y.o. male who returns to [facility] for follow up.  Please also see HPI as per A&P above.    Occ sub fever and chills   Recent Covid 19 infection  Had HA with the Covid infection and now improved  Some indigestion this morning  He is having prominent low energy and fatigue and some muscle aches  Some constipation and bloating/gas, no diarrhea    Nasal congestion    Weight up a couple of pounds    Abdominal pain resolved after stone removal   No pain with urination, no blood in urine but had mild blood in UA while in the ED  No increased urgency of urination or frequency of urination    ROS: Pertinent positives and negatives are documented as per the HPI.    I have reviewed and (if needed) updated the patient's problem list, medications, allergies, past medical and surgical history, social and family history in the EMR.    Objective     Physical Exam   Vitals:   Vitals:    05/02/21 0805   BP: 139/80   Pulse: 74   Temp: 37 ??C (98.6 ??F)     Wt Readings from Last 3 Encounters:   05/02/21 (!) 103.4 kg (228 lb)   02/21/21 (!) 102 kg (224 lb 14.4 oz)   02/18/21 (!) 101.6 kg (224 lb)     Gen: tired but in no distress  Eyes: conjunctiva clear, no lower lid pallor  Resp: Normal WOB, CTAB and no adventitious sounds  CV: RRR, no murmurs or gallops, distal pulses 2+ symmetric, trace pitting edema.  GI: ND, normoactive BS, soft and no tenderness. No hepatomegaly  MSK: no pain on palpation of musculature.  Strength intact.  Neuro:  No nystagmus.  Normal finger to nose.      PHQ-2 Score:  PHQ-2 Total Score : 2      Labs and Studies in EMR and Reviewed    I personally spent 42 minutes face-to-face and non-face-to-face in the care of this patient, which includes all pre, intra, and post visit time on the date of service.

## 2021-05-02 NOTE — Unmapped (Addendum)
Labs today given the fatigue/low energy and the muscle aches    Start Symbicort 2 puffs twice daily    Start Flonase 2 sprays daily    I recommend pulmonary function tests to reassess your COPD/asthma    I recommend a CT chest    Follow up in 3 months    If you have any questions, call the [facility] at [phone] during regular business hours, or send me a message through MyChart. Please allow 2 business days for responses to [employer]. For urgent concerns after hours and weekends, please call the [facility] at [phone].    Kelli Churn, MD

## 2021-05-03 NOTE — Unmapped (Signed)
Message left instructing  patient to call clinic for lab results, contact information provided

## 2021-05-03 NOTE — Unmapped (Signed)
Informed the message from Dr. Coralee North.  Patient agreed.

## 2021-05-03 NOTE — Unmapped (Signed)
Clinical Child psychotherapist at the [facility] (outpatient):    [] 5       [] 10     [] 15      [] 20      [] 30     [x] 45   []   []  InP   [] Mail   [] Em    [x] MyC   [] Ph    [] DNC          Portable nebulizer: Patient states his nebulizer is 30+ years old and he needs a new one. He would prefer a portable model.  Nebulized medication is albuterol and patient has asthma diagnosis.   SW reviewed Medicare criteria and found that a small volume ultrasonic nebulizer (D6644) and related accessories are??considered for coverage when it is reasonable and necessary??to administer treprostinil inhalation solution to beneficiaries with pulmonary hypertension only.  Claims for code [phone] used with other inhalation solutions will be denied as not reasonable and necessary.  However, private-pay portable nebulizes are available on [employer] for less than $50, apparently without a prescription.  Alternatively, PCP could request a standard nebulizer, and the patient's insurance would probably cover 80%, with the patient being responsible for the remaining 20% (probably $10-40).  SW sent MyChart message to patient with options.   1. You could purchase a portable nebulizer yourself.  I found them on [employer].com for around $30-50, apparently with no prescription needed.  If you choose this option, read carefully to learn about any warranty or repair service that may be offered.  2. We could order a standard nebulizer through a local home medical equipment store such as [facility] ph [phone]. Your insurance would probably cover 80%, and you'd be responsible for the remaining 20% (probably $10-40).    ---------------------------------------------------------------------  Patient Information:  Name: William Keller, William Keller    MRN: [id number]  DOB: January 27, 1941  Address: [address]  [place] [address]  [address]  Primary phone: [phone]  Email: [email or web address]  PCP: Ericka Pontiff, MD  ---------------------------------------------------------------------

## 2021-05-03 NOTE — Unmapped (Signed)
-----   Message from [facility], MD sent at 05/02/2021  2:19 PM EST -----  Regarding: labs/message  Hello All,    Can someone please call this patient to let him know a muscle enzyme CK is elevated and his liver/muscle enzymes are slightly elevated.  I think the Zetia could be causing his muscle pain and I wonder if this is also contributing to his low energy.  I recommend doing a 2 week trial off the medication and seeing if this helps his symptoms.  If he feels better, I still highly recommend the trial of the injectable PCSK9 inhibitor prescribed by cardiology (alirocumab) to lower his cholesterol levels as he is at high risk for a stroke or further cardiovascular disease.  His A1c is close to goal 7.9% and goal < 7.5%.  Given close to goal and home blood sugars improved, I do not recommend changes right now.     Thank you,Rosanne

## 2021-05-04 NOTE — Unmapped (Signed)
-----   Message from [place] sent at 05/03/2021  1:34 PM EST -----  Regarding: FW: PA request    ----- Message -----  From: Conchita Paris, MD  Sent: 05/03/2021  10:52 AM EST  To: , #  Subject: PA request                                       Hi All,  This patient needs a PA request for Meclizine for vertigo/BPPV.  Thank you, Rosanne

## 2021-05-04 NOTE — Unmapped (Signed)
[facility]  Telephone Note  Date of call: 05/04/2021  PCP: Coralee North    Reason for call: initiate appeal via TC for denial of meclizine    Submitted appeal via TC for coverage of meclizine therapy per Dr. Coralee North. Pt and MD info given, and clinical questions answered via TC with [employer] rep.    Decision will be rendered w/in 7 days--provider notified via fax.    Case #: [id number]    Follow up: Decision will be sent via fax to Dr. Coralee North    --  Orlinda Blalock, PharmD, BCPS, CPP  Clinical Pharmacist Practitioner  [facility]

## 2021-05-05 ENCOUNTER — Ambulatory Visit: Admit: 2021-05-05 | Discharge: 2021-05-06 | Payer: MEDICARE

## 2021-05-05 DIAGNOSIS — N200 Calculus of kidney: Principal | ICD-10-CM

## 2021-05-05 DIAGNOSIS — Z87442 Personal history of urinary calculi: Principal | ICD-10-CM

## 2021-05-05 NOTE — Unmapped (Signed)
ANALYSIS AND PLAN:  This is a 80 y.o.-male with a previous history of uric acid stones.  The patient was noted with bilateral non-obstructing kidney stones on recent RUS 04/10/21. I discussed with the patient His imaging studies.  I think it is quite favorable as there is no evidence of obstruction.  Given that he opts to defer any definitive stone removal procedures, he can certainly opt for observation. He can continue to follow up with [place] or with local urologist.  Today we discussed general stone prevention measures such as increasing fluid intake to at least 2.5 L/day, adding citrus or lemonade, limiting foods high in oxalates, being mindful of animal protein portions, and decreasing sodium intake to <2,000 mg/day along with benefit of 24-hour urine collection to help tailor stone prevention strategies specific to him. Mentioned that he may benefit from certain medications/supplements to help reduce risk of kidney stones, but I would be hesitant to do this without having a 24-hour urine prior.  Offered option to follow up in about 8 months to assess stone burden with RUS prior.  Lastly, recommended he speak to PCP about vertigo/dizziness and hepatic steatosis.    CHIEF COMPLAINT:  History of nephrolithiasis, here for follow-up.    BRIEF HISTORY OF PRESENT ILLNESS:  William Keller is a very pleasant  80 y.o. male with a history of BPH s/p urolift,  prior nephrolithiasis. The patient underwent a right percutaneous nephrolithotomy on 02/21/21 with Dr. Dow Adolph, and now returns to urology clinic for follow-up.  Since last seen, he notes that he has been doing relatively well.  The patient denies any stone pain or stone passage.  Today he denies any lower urinary tract symptoms, specifically dysuria, hematuria or urinary tract infection.  The patient also notes no fevers, chills, nausea, vomiting, or flank pain. Today he reports he was able to remove stent without issue.  Notes that he feels dizzy often and has a history of vertigo, mentions his PCP is aware.    Denies history gout, does mention that his father and brother both had gout.  Denies chronic diarrhea,.  Denies calcium/vitamin D supplementation  Notes positive family history of kidney stones in his daughter.   Reports that he was previously on potassium citrate prescribed by local urologist but discontinued due to GI upset.     Per review of records, he presented to ED on 04/10/21 with right flank pain, mentions he though he had a UTI. UC: showed no growth. UA: 4 RBCs.  Today he feels better.  He mentions he would prefer to hold off on any future surgery given that he had prolonged recovery phase with his last PCNL.    Of note he follows with local urologist for BPH/LUTS s/p urolift 2021 and history of bladder CA, s/p TURBT 06/12/16    01/04/21: negative cysto   01/15/21 CTU: noted Bilateral nephrolithiasis, right greater than left without hydronephrosis. Numerous bilateral renal cysts an additional subcentimeter low-attenuation lesions that are too small to characterize on CT. No suspicious renal lesion identified        PAST MEDICAL HISTORY, SURGICAL HISTORY, FAMILY HISTORY, SOCIAL  HISTORY, MEDICATIONS AND ALLERGIES:  Otherwise, unchanged from  before.    REVIEW OF SYSTEMS:  Completed by the patient and reviewed by me  today.  All pertinent positives discussed in history present  illness.    PHYSICAL EXAMINATION:  VITAL SIGNS:  BP 132/73  - Pulse 73  - Temp 37.1 ??C (98.7 ??F) (Temporal)  - Ht 175.3  cm (5' 9)  - Wt 99.5 kg (219 lb 4.8 oz)  - BMI 32.38 kg/m??   GENERAL:  Stable.  No apparent distress.  ABDOMEN:  Soft, nontender, nondistended, no CVA tenderness.    LABORATORY TESTING:  Previous stone analysis was 100% Uric acid on 02/21/21.        Chemistry        Component Value Date/Time    NA 139 05/02/2021 0851    NA 137 01/09/2014 0911    K 4.5 05/02/2021 0851    K 4.6 01/09/2014 0911    CL 102 05/02/2021 0851    CL 100 01/09/2014 0911    CO2 28.4 05/02/2021 0851 CO2 28 01/09/2014 0911    BUN 22 05/02/2021 0851    BUN 10 01/09/2014 0911    CREATININE 0.92 05/02/2021 0851    CREATININE 1.1 01/15/2021 1406    GLU 135 05/02/2021 0851        Component Value Date/Time    CALCIUM 9.7 05/02/2021 0851    CALCIUM 9.4 01/09/2014 0911    ALKPHOS 72 05/02/2021 0851    AST 48 (H) 05/02/2021 0851    AST 37 07/20/2014 0916    ALT 87 (H) 05/02/2021 0851    ALT 23 07/20/2014 0916    BILITOT 0.5 05/02/2021 0851            01/15/21 CTU:  IMPRESSION:  1. Bilateral nephrolithiasis and numerous bilateral renal cysts.  2. Mild nonspecific thickening of the urinary bladder which may be related to underdistention, chronic bladder outlet obstruction and/or cystitis. Clinical correlation is advised.  3. Hepatic steatosis.  4. Additional findings, as described above.      04/10/21 RUS:  IMPRESSION:  -No hydronephrosis.  -Bilateral renal cysts and renal calculi, seen on prior CT.  -Partially visualized echogenic liver suggesting hepatic steatosis.

## 2021-05-05 NOTE — Unmapped (Addendum)
Your kidney stone from September was 100% uric acid.      Based on a recent ultrasound from 04/10/21, you were noted to have kidney stones on both kidneys, none are blocking the flow of the kidney which is good news. Based on our conversation today, we can certainly continue to watch these stones but I would recommend trying to make some changes to your diet to help prevent these stones from getting bigger and to prevent new stones from forming.     Please speak to you primary care provider about fatty liver and dizziness.     I would also recommend you do a 24-hour urine test to see what in your urine is making you form kidney stones, please let us know if you would like to do this. This would help [place] find medications/supplements that may be able to help prevent the risk of future kidney stones.     Otherwise, we will plan to see you back in about 8 months with ultrasound and xray to check on your kidneys.     Additionally, please seek immediate medical attention if you ever develop fever/chills, nausea/vomiting, and significant flank pain.      [facility]: Kidney Stone Prevention     Kidney stones can form due to one or all of the following:     1. Low fluid Intake   2. Low citrate in the urine   3. Too much salt, oxalate, and animal protein     The following dietary changes may decrease your risk of forming stones.     1. Increase your fluid intake to around 2.5 - 3 liters per day. Diluting the urine hinders the formation of stones. You should drink enough fluid to make 2 liters of urine a day. You know you are drinking enough fluid if your urine is clear.     2. Decrease your salt intake to less than 2000mg /day (sodium)  This will decrease the amount of calcium excreted in your urine. Eat more fresh or frozen vegetables instead of canned. Eat less processed meats. At restaurants request your food to be prepared without salt or high sodium seasoning.     3. Limit the amount of Oxalate containing foods in your diet to 50mg  per day.   This will decrease the amount of oxalate excreted in your urine.     Spinach   Potato  Nuts   Peanut Butter   Chocolate   Tea  Beets    4. Limit the amount of animal proteins in your diet to approximately the size of 1 deck of cards per meal  This will decrease the amount of uric acid excreted in your urine.     Fish   Liver   Chicken   Red Meat - no more than 3 servings per week   Pork  [place]    5. Increase the amount of citrate in your diet. Most fruits and vegetables are high in citrate. Increasing the amount of fruit and vegetable your diet will raise your citrate.  Lemons and limes have the highest amount of citrate. A recipe for lemonade may be helpful to increase citrate in your diet: 1/2 cup concentrated lemon juice to 2 quarts of water. Sweeten to taste.

## 2021-05-12 ENCOUNTER — Ambulatory Visit
Admit: 2021-05-12 | Discharge: 2021-05-13 | Payer: MEDICARE | Attending: Cardiovascular Disease | Primary: Cardiovascular Disease

## 2021-05-12 DIAGNOSIS — E785 Hyperlipidemia, unspecified: Principal | ICD-10-CM

## 2021-05-12 LAB — LIPID PANEL
CHOLESTEROL/HDL RATIO SCREEN: 5.4 — ABNORMAL HIGH (ref 1.0–4.5)
CHOLESTEROL: 242 mg/dL — ABNORMAL HIGH (ref <=200)
HDL CHOLESTEROL: 45 mg/dL (ref 40–60)
NON-HDL CHOLESTEROL: 197 mg/dL — ABNORMAL HIGH (ref 70–130)
TRIGLYCERIDES: 403 mg/dL — ABNORMAL HIGH (ref 0–150)

## 2021-05-12 LAB — HEPATIC FUNCTION PANEL
ALBUMIN: 3.9 g/dL (ref 3.4–5.0)
ALKALINE PHOSPHATASE: 74 U/L (ref 46–116)
ALT (SGPT): 28 U/L (ref 10–49)
AST (SGOT): 14 U/L (ref <=34)
BILIRUBIN DIRECT: <0.1 mg/dL (ref 0.00–0.30)
BILIRUBIN TOTAL: 0.4 mg/dL (ref 0.3–1.2)
PROTEIN TOTAL: 7.1 g/dL (ref 5.7–8.2)

## 2021-05-12 NOTE — Unmapped (Signed)
Stay off Zetia  Begin

## 2021-05-12 NOTE — Unmapped (Unsigned)
I personally spent 30 minutes face-to-face and non-face-to-face in the care of this patient, which includes all pre, intra, and post visit time on the date of service.    Complexity:  Clinical lab tests reviewed.  Imaging Reviewed in EMR   Previous notes and scanned documents reviewed in EMR      ZOX:WRUEAVW Lynnae January, MD    REASON FOR VISIT  Patient is referred for follow up on cardiovascular issues    Assessment/Plan:   Coronary artery disease   William Keller is doing well with respect to his cardiovascular disease.  He has had no recurrent angina and has not used nitroglycerin.  He is scheduled to start Repatha in January and will discontinue Zetia. He has a history of coronary artery disease dating back to 2016.  He was admitted for an ST elevation myocardial infarction.  He has cardiac risk factors which include diabetes mellitus, hypertension and hyperlipidemia.  He has not previously been able to tolerate statin therapy and now has Repatha.    -Begin Repatha every 14 days  -Direct LDL is pending  -Continue aspirin 81 mg daily    Hypertension  William Keller blood pressure is controlled today.      Hyperlipidemia  He is on secondary therapy with Repatha  -Direct LDL pending  -Follow-up 3 months for repeat lipid check    He will follow-up in 3 months with lipid check at that time.      HISTORY OF PRESENT ILLNESS  William Keller is a 80 year old gentleman initially referred for evaluation prior to kidney stone surgery.  He is in today to follow-up on his cardiovascular history.  Since our last visit he has done well and has begun Repatha for secondary prevention.  He is tolerating without difficulty.  His direct LDL from today is pending.  At last visit we asked him to start aspirin which she has done.  He has had no angina and has used no nitroglycerin.    He has a cardiac history which dates back to 2016.  He presented with an ST elevation MI in April 2016.  He was recatheterized in late 2016 with no intervention.      His diabetes is poorly controlled with a last hemoglobin A1c of 8.2%.  He is working harder on diet.    CARDIAC REVIEW OF SYSTEMS:    CHEST PAIN/PRESSURE: no  DYSPNEA AT REST: no  DYSPNEA WITH EXERTION: yes stable COPD  ORTHOPNEA: no  PND:  no  PALPITATIONS:  no  DIZZINESS: no  SYNCOPE/NEAR SYNCOPE:  no  COUGH:  no  WEIGHT CHANGE: no  APPETITE NORMAL:  yes  BOWEL/BLADDER HABITS NORMAL:  yes  ACTIVITY NORMAL:  yes active around his home  EXERCISE TOLERANCE NORMAL:  no regular exercise    COMPLIANT WITH MEDICATIONS:  yes      Past Medical History:   Diagnosis Date   ??? Arthritis    ??? Asthma    ??? Cancer (CMS-HCC)    ??? Diabetes mellitus (CMS-HCC)    ??? Hearing impairment     hearing aids   ??? Hyperlipidemia    ??? Kidney stone    ??? Myocardial infarction (CMS-HCC)    ??? Peripheral neuropathy    ??? Visual impairment     cataract sx 05/27/20     Past Surgical History:   Procedure Laterality Date   ??? CATARACT EXTRACTION     ??? CATARACT EXTRACTION, BILATERAL     ??? CHG ULTRASONIC GUIDANCE, INTRAOPERATIVE Right  02/21/2021    Procedure: ULTRASONIC GUIDANCE, INTRAOPERATIVE;  Surgeon: Vickii Penna, MD;  [facility]: [facility];  Service: Urology   ??? esophaus rap surgery     ??? PR CATH PLACE/CORON ANGIO, IMG SUPER/INTERP,W LEFT HEART VENTRICULOGRAPHY N/A 03/31/2015    Procedure: Left Heart Catheterization;  Surgeon: Joanie Coddington, MD;  Location: [facility] CATH;  Service: Cardiology   ??? PR COLONOSCOPY FLX DX W/COLLJ SPEC WHEN PFRMD N/A 08/29/2019    Procedure: COLONOSCOPY, FLEXIBLE, PROXIMAL TO SPLENIC FLEXURE; DIAGNOSTIC, W/WO COLLECTION SPECIMEN BY BRUSH OR WASH;  Surgeon: Leland Her, MD;  Location: [facility]ED[facility];  Service: Gastroenterology   ??? PR PERCUT DRAIN/INJECT RENAL CYST Right 02/21/2021    Procedure: ASPIRATION AND/OR INJECTION OF RENAL CYST OR PELVIS BY NEEDLE, PERCUTANEOUS;  Surgeon: Vickii Penna, MD;  [facility]: [facility] OR [facility];  Service: Urology   ??? PR PERCUT REMV KID STONE,2+ CM Right 02/21/2021    Procedure: supine standard PCNL; need fortec;  Surgeon: Vickii Penna, MD;  Location: [facility] OR [facility];  Service: Urology   ??? PR PVB THORACIC SINGLE INJECTION SITE W/IMG GID Right 02/21/2021    Procedure: PARAVERTEBRAL BLOCK (PVB) (PARASPINOUS BLOCK), THORACIC; SINGLE INJECTION SITE (INCLUDES IMAGING GUIDANCE, WHEN PERFORMED);  Surgeon: Vickii Penna, MD;  Location: [facility] OR [facility];  Service: Urology   ??? PR UPPER GI ENDOSCOPY,BIOPSY N/A 08/29/2019    Procedure: UGI ENDOSCOPY; WITH BIOPSY, SINGLE OR MULTIPLE;  Surgeon: Leland Her, MD;  Location: [facility];  Service: Gastroenterology   ??? removal for fatty tumors     ??? SINUS SURGERY     ??? SINUS SURGERY     ??? SKIN BIOPSY     ??? urolift       Allergies   Allergen Reactions   ??? Glipizide Shortness Of Breath   ??? Actos [Pioglitazone]      Pt states it gave him a heart attack   ??? Crestor [Rosuvastatin] Other (See Comments)     indigestion   ??? Esomeprazole Magnesium Other (See Comments)     Makes patients heart burn worse   ??? Esomeprazole Magnesium Other (See Comments)     Makes patients heart burn worse  Other reaction(s): Other (See Comments)  Makes patients heart burn worse  Makes patients heart burn worse  Makes patients heart burn worse     ??? Liraglutide Other (See Comments)     bloating   ??? Metformin      Hospitalized for shortness of breath/asthma.     ??? Midazolam Other (See Comments)     Doesn't do well with after effects    ??? Sitagliptin      Other reaction(s): JOINT PAIN   ??? Amoxicillin Nausea Only     Other reaction(s): NAUSEA   ??? Atorvastatin      indigestion   ??? Erythromycin Base Nausea Only     Other reaction(s): OTHER   ??? Glimepiride Rash     Other reaction(s): RASH   ??? Oxycodone Nausea Only     Pt states he was severely and acutely nauseated and bloated       Current Outpatient Medications   Medication Sig Dispense Refill   ??? acetaminophen (TYLENOL EXTRA STRENGTH) 500 MG tablet Take 1 tablet (500 mg total) by mouth two (2) times a day. May try with 1 aleve for acute pain. 100 tablet 2   ??? albuterol HFA 90 mcg/actuation inhaler Inhale 2 puffs  every six (6) hours as needed for wheezing. 8 g 3   ??? ASPIRIN LOW DOSE ORAL Take by mouth.     ??? budesonide-formoteroL (SYMBICORT) 160-4.5 mcg/actuation inhaler Inhale 2 puffs Two (2) times a day. (Patient taking differently: Inhale 2 puffs two (2) times a day as needed.) 6 g 3   ??? clobetasoL (TEMOVATE) 0.05 % cream Apply topically Two (2) times a day. 30 g 0   ??? empty container (SHARPS-A-GATOR DISPOSAL SYSTEM) Misc Use as directed for sharps disposal 1 each 2   ??? ezetimibe (ZETIA) 10 mg tablet Take 1 tablet (10 mg total) by mouth in the morning. 90 tablet 3   ??? fluocinonide (LIDEX) 0.05 % external solution Apply topically Two (2) times a day. 60 mL 1   ??? fluticasone propionate (FLONASE) 50 mcg/actuation nasal spray 2 sprays into each nostril daily. 16 g 11   ??? hydrocortisone (CORTEF) 5 MG tablet Take 1 tablet by mouth two (2) times a day. With breakfast and lunch     ??? insulin NPH-insulin regular, 70/30, (HUMULIN/NOVOLIN) 100 unit/mL (70-30) injection Inject 0.18 mL (18 Units total) under the skin Two (2) times a day. 20 mL 3   ??? insulin syr/ndl U100 half mark 0.5 mL 31 gauge x 15/64 (6 mm) Syrg by Miscellaneous route.      ??? ketoconazole (NIZORAL) 2 % shampoo WASH SCALP 3 TIMES A WEEK. LEAVE IT IN PLACE FOR 30 MINUTES BEFORE RINSING     ??? meclizine (ANTIVERT) 12.5 mg tablet Take 1 tablet (12.5 mg total) by mouth Three (3) times a day as needed (vertigo). 30 tablet 2   ??? neomycin-polymyxin-hydrocortisone (CORTISPORIN) 3.5-10,000-1 mg/mL-unit/mL-% otic solution Administer 3 drops into both ears Four (4) times a day. As needed for ear itching. 10 mL 0   ??? testosterone 1.62 % (20.25 mg/1.25 gram) GlPk Use one packet, 20.25mg  daily.  Apply to shoulder 30 packet 5   ??? albuterol 2.5 mg /3 mL (0.083 %) nebulizer solution Inhale 3 mL (2.5 mg total) by nebulization every six (6) hours as needed for wheezing. (Patient not taking: Reported on 05/12/2021) 3 mL 1   ??? alirocumab (PRALUENT) 150 mg/mL subcutaneous injection Inject the contents of one pen (150 mg total) under the skin every fourteen (14) days. (Patient not taking: Reported on 05/02/2021) 6 mL 3   ??? COVID-19 antigen test (COVID-19 AT-HOME TEST) Kit 2 kits by Miscellaneous route daily as needed. (Patient not taking: Reported on 05/12/2021) 2 kit 1     No current facility-administered medications for this visit.         ROS: As noted above and otherwise negative on 10 point review of systems    Social History     Socioeconomic History   ??? Marital status: Married   Tobacco Use   ??? Smoking status: Former Smoker     Packs/day: 1.00     Years: 20.00     Pack years: 20.00     Types: Cigarettes   ??? Smokeless tobacco: Former Neurosurgeon     Quit date: 1992   ??? Tobacco comment: 30 years ago   Vaping Use   ??? Vaping Use: Never used   Substance and Sexual Activity   ??? Alcohol use: Not Currently     Comment: rarely    ??? Drug use: No   ??? Sexual activity: Not Currently     Partners: Female     Comment: work day to day     Social Determinants of  Health     Financial Resource Strain: Low Risk    ??? Difficulty of Paying Living Expenses: Not hard at all   Food Insecurity: No Food Insecurity   ??? Worried About Programme researcher, broadcasting/film/video in the Last Year: Never true   ??? Ran Out of Food in the Last Year: Never true   Transportation Needs: No Transportation Needs   ??? Lack of Transportation (Medical): No   ??? Lack of Transportation (Non-Medical): No      Family History   Problem Relation Age of Onset   ??? Cancer Mother         throat cancer   ??? Emphysema Father    ??? Melanoma Neg Hx    ??? Basal cell carcinoma Neg Hx    ??? Squamous cell carcinoma Neg Hx         Vitals:    05/12/21 1249   BP: 138/73   BP Site: L Arm   BP Position: Sitting   BP Cuff Size: Large   Pulse: 82   SpO2: 97%   Weight: (!) 105.7 kg (233 lb)   Height: 175.3 cm (5' 9)       Wt Readings from Last 3 Encounters:   05/12/21 (!) 105.7 kg (233 lb)   05/05/21 99.5 kg (219 lb 4.8 oz)   05/02/21 (!) 103.4 kg (228 lb)     BP Readings from Last 3 Encounters:   05/12/21 138/73   05/05/21 132/73   05/02/21 139/80       General Appearance:    Alert, cooperative, no distress, appears stated age   HEENT:   No carotid bruits bilaterally or JVD   Respiratory:     Clear to auscultation bilaterally, respirations unlabored   Chest wall:    No tenderness or deformity   Cardiovascular:    Regular rate and rhythm, S1 and S2 normal, no rub or   gallop; No obvious murmur. Arterial pulses present and equal.     Results for orders placed or performed in visit on 05/02/21   Basic metabolic panel   Result Value Ref Range    Sodium 139 135 - 145 mmol/L    Potassium 4.5 3.4 - 4.8 mmol/L    Chloride 102 98 - 107 mmol/L    CO2 28.4 20.0 - 31.0 mmol/L    Anion Gap 9 5 - 14 mmol/L    BUN 22 9 - 23 mg/dL    Creatinine 1.61 0.96 - 1.10 mg/dL    BUN/Creatinine Ratio 24     eGFR CKD-EPI (2021) Male 85 >=60 mL/min/1.64m2    Glucose 135 70 - 179 mg/dL    Calcium 9.7 8.7 - 04.5 mg/dL   CBC   Result Value Ref Range    WBC 6.1 3.6 - 11.2 10*9/L    RBC 4.50 4.26 - 5.60 10*12/L    HGB 13.8 12.9 - 16.5 g/dL    HCT 40.9 81.1 - 91.4 %    MCV 91.6 77.6 - 95.7 fL    MCH 30.7 25.9 - 32.4 pg    MCHC 33.5 32.0 - 36.0 g/dL    RDW 78.2 95.6 - 21.3 %    MPV 9.1 6.8 - 10.7 fL    Platelet 188 150 - 450 10*9/L   Hemoglobin A1c   Result Value Ref Range    Hemoglobin A1C 7.9 (H) 4.8 - 5.6 %    Estimated Average Glucose 180 mg/dL   Cortisol   Result Value Ref Range  Cortisol 20.4 See Comment ug/dL   CK   Result Value Ref Range    Creatine Kinase, Total 210.0 (H) 46.0 - 171.0 U/L   Hepatic Function Panel   Result Value Ref Range    Albumin 4.4 3.4 - 5.0 g/dL    Total Protein 7.8 5.7 - 8.2 g/dL    Total Bilirubin 0.5 0.3 - 1.2 mg/dL    Bilirubin, Direct 1.61 0.00 - 0.30 mg/dL    AST 48 (H) <=09 U/L    ALT 87 (H) 10 - 49 U/L    Alkaline Phosphatase 72 46 - 116 U/L TSH   Result Value Ref Range    TSH 0.804 0.550 - 4.780 uIU/mL   T4, Free   Result Value Ref Range    Free T4 1.10 0.89 - 1.76 ng/dL Triglycerides 604 (H) 54/01/8118    Triglycerides 261 (H) 07/20/2014    HDL 41 12/27/2020    HDL 46 07/20/2014    LDL Calculated  06/25/2019      Comment:      Unable to calculate due to triglyceride greater than 400 mg/dL.    LDL Direct 215.7 12/27/2020    LDL Cholesterol, Calculated 206 07/20/2014

## 2021-05-13 ENCOUNTER — Ambulatory Visit: Admit: 2021-05-13 | Discharge: 2021-05-14 | Payer: MEDICARE

## 2021-05-19 NOTE — Unmapped (Signed)
[facility] Clinical Intervention    Type of intervention: Patient education    Medication involved: Praluent 150mg /mL    Intervention performed: Mr William Keller reports he has not yet started Praluent. His plan is to take first injection on 05/29/21.     Follow-up needed: Will reschedule call for mid January after patient has been able take first 2 injections.     Approximate time spent: 5-10 minutes    Clinical evidence used to support intervention: Professional judgement    Camillo Flaming   [facility] Specialty Pharmacist

## 2021-05-25 DIAGNOSIS — R310 Gross hematuria: Principal | ICD-10-CM

## 2021-05-25 NOTE — Unmapped (Signed)
Triage Note       Patient: William Keller                              ZO:[id number]       Reason for call:  pt experiencing painful gross hem every morning since procedure, then his urine clears.       Time call returned: 1:26 PM       Phone Assessment:   blood in urine obvious in the morning / then clears. Right flank pain. Denies fever chills etc. Unable to lift anything without pain. Hydrating well.     Triage Recommendations: Continue to hydrate, monitor for worsening symptoms. Urine culture and urinalysis. Update providers.       Patient Response:  Appreciative of call and he will go to [facility] for test tomorrow      Outstanding tasks: Notify providers for input      Patient Pharmacy has been verified and primary pharmacy has been marked as preferred.  Verified      Elvina Sidle, LPN  Triage Nurse  [facility]

## 2021-05-25 NOTE — Unmapped (Signed)
Hi,      Betty(wife)  contacted the clinic regarding the following:    Pt is has hematuria and having pain on right of his back.   Please contact  Kathie Rhodes at [phone]    Thank you,    [facility]  Urology Services  [phone]

## 2021-05-26 ENCOUNTER — Ambulatory Visit: Admit: 2021-05-26 | Discharge: 2021-05-27 | Payer: MEDICARE

## 2021-05-26 LAB — URINALYSIS
BACTERIA: NONE SEEN /HPF
BILIRUBIN UA: NEGATIVE
GLUCOSE UA: NEGATIVE
KETONES UA: NEGATIVE
LEUKOCYTE ESTERASE UA: NEGATIVE
NITRITE UA: NEGATIVE
PH UA: 5.5 (ref 5.0–9.0)
PROTEIN UA: NEGATIVE
RBC UA: 3 /HPF (ref <=3)
SPECIFIC GRAVITY UA: 1.006 (ref 1.003–1.030)
SQUAMOUS EPITHELIAL: <1 /HPF (ref 0–5)
UROBILINOGEN UA: <2
WBC UA: <1 /HPF (ref <=2)

## 2021-05-30 MED ORDER — TESTOSTERONE 1.62 % (20.25 MG/1.25 GRAM) TRANSDERMAL GEL PACKET
0 refills | 0 days
Start: 2021-05-30 — End: ?

## 2021-05-31 MED ORDER — TESTOSTERONE 1.62 % (20.25 MG/1.25 GRAM) TRANSDERMAL GEL PACKET
0 refills | 0.00000 days | Status: CP
Start: 2021-05-31 — End: ?

## 2021-06-02 NOTE — Unmapped (Signed)
Called and spoke with spouse who stated patient has been vomiting and having diarrhea since Sunday. Per spouse vomiting has stopped patient now has congestion.   Patient denies fever but has a cough.   Cough is productive with yellow sputum.  Patient denies sob hoarse.   Patient stated he is having 3 times a day (patient feels this is improving.   Patient is not eating a lot but is drinking good.  Patient took a home covid test that was negative. No appointments today scheduled tomorrow advised to seek medical attention if symptoms worsen prior to appointment   mich  Same Day Clinic Referral Intake      Patient Name: William Keller    Patient MRN: [id number]    Location of Patient: Home    Source of Referral: Patient Call (Self-referral)    Referring Provider Name, Contact Number (if applicable):     Reason for Referral: diarrhea cough  congestion cough    Procedure Anticipated?  If yes, select procedure: ?    Diagnostic Testing Anticipated?  If yes, select test(s): Throat Swab and RSV COVID ?    Vitals:  n/a    Additional Info: None    Will patient be seen on the day of referral in Same Day Clinic? No, added to next day's schedule

## 2021-06-02 NOTE — Unmapped (Signed)
Pt has had vomiting and diahrea since Sunday. Please call.

## 2021-06-03 ENCOUNTER — Ambulatory Visit
Admit: 2021-06-03 | Discharge: 2021-06-04 | Payer: MEDICARE | Attending: Student in an Organized Health Care Education/Training Program | Primary: Student in an Organized Health Care Education/Training Program

## 2021-06-03 DIAGNOSIS — R1110 Vomiting, unspecified: Principal | ICD-10-CM

## 2021-06-03 DIAGNOSIS — R059 Cough, unspecified type: Principal | ICD-10-CM

## 2021-06-03 MED ORDER — ONDANSETRON 4 MG DISINTEGRATING TABLET
ORAL_TABLET | Freq: Three times a day (TID) | ORAL | 0 refills | 4.00000 days | Status: CP | PRN
Start: 2021-06-03 — End: 2021-06-10

## 2021-06-03 NOTE — Unmapped (Signed)
[facility] SAME DAY CLINIC NOTE     06/03/2021    PCP: Ericka Pontiff, MD     Assessment and Plan    [name] was seen today for diarrhea, cough and nasal congestion.    Diagnoses and all orders for this visit:    Vomiting - Cough/Congestion - Diarrhea: suspect he unfortunately has both norovirus (known contact with norovirus at birthday party and copious watery stools and vomiting consistent with this) as well as viral URI (congestion/cough/rinorrhea). Overall improving - only 1x stool today, no vomiting in the last 48 hrs. VSS here, appears well hydrated.   - Continue oral rehydration  - Return precautions discussed  - ondansetron (ZOFRAN-ODT) 4 MG disintegrating tablet; Take 1 tablet (4 mg total) by mouth every eight (8) hours as needed for nausea for up to 7 days.  - he endorses some chronic abdominal bloating/gas pain ongoing for months to years- recommend he discuss with PCP after acute illness has resolved    Follow up as scheduled or sooner as needed.    Patient was seen and discussed with Dr. Dietrich Pates who is in agreement with the assessment and plan as outlined above.       Subjective    Problem List:  Patient Active Problem List   Diagnosis   ??? Asthma   ??? Brachial neuritis   ??? Sinusitis, chronic   ??? Chronic airway obstruction (CMS-HCC)   ??? Type 2 diabetes mellitus with diabetic polyneuropathy, with long-term current use of insulin (CMS-HCC)   ??? Esophageal reflux   ??? Hypogonadism   ??? Obesity   ??? Psoriasis and similar disorder   ??? Hyperlipemia   ??? Coronary artery disease involving native coronary artery of native heart without angina pectoris   ??? Sensory hearing loss, bilateral   ??? Abnormal findings on diagnostic imaging of lung   ??? Allergic rhinitis   ??? Arthritis   ??? Degeneration of intervertebral disc of cervical region   ??? Empty sella syndrome (CMS-HCC)   ??? Long term current use of systemic steroids   ??? Benign prostatic hyperplasia with urinary frequency   ??? History of kidney stones HPI  William Keller is a 81 y.o. year old male with the above problem list who presents to the same day clinic for   Chief Complaint   Patient presents with   ??? Diarrhea   ??? Cough   ??? Nasal Congestion     5 days       On Sunday, he developed vomiting. Multiple times per day. He also developed diarrhea, cough, congestion. He has not vomited since Wednesday but has had diarrhea. Only liquid - looks like watered down mustard.   He went to a birthday party where other people were sick. He thinks he got it there.     He has had a low grade temperature intermittently. He has been taking tessalon perles (leftover from COVID course) as well as Gas-x. Took immodium which did not seem to help. He continues to have lots of loose stools - was stooling hourly, now it is better (stooled once day though still liquid). Since Wednesday, has been having sandwiches and staying well hydrated.    Meds and allergies were reviewed in Epic    ROS: 10 point ROS was performed and is otherwise negative other than mentioned in the HPI    Objective  PE:  Vitals:    06/03/21 1436   BP: 131/66   Pulse: 81   Temp: 36.5 ??  C (97.7 ??F)   SpO2: 97%     General: well-appearing in NAD, maybe mildly fatigued  Eyes: EOMI, sclera clear, PERRL  ENT: OP clear w/o erythema or exudate  CV: RRR  Resp: CTAB, no wheezes or crackles, normal WOB  GI: soft, mildly distended, hyperactive bowel sounds, very mild TTP periumbilical region, no rebound/guarding  Ext: no cyanosis/clubbing/edema  Neuro: alert, follows commands    Procedure: None    _______________________________________________________________________________________    Same Day Metric Tracker:    Same Day Metric Tracker:  Referral source for today's visit:  Geriatrics    Referral to other outpatient urgent services? N/A    Did today's visit result in referral to ED or direct admission? No

## 2021-06-03 NOTE — Unmapped (Addendum)
You can take mucinex (guafenisen) over the counter for congestion    You can take simethicone (Gas-x) over the counter for gas pain.     PLEASE stay hydrated - drink oral rehydration solutions, gatorade, water. You should be drinking enough is very light yellow to clear!    Please return if you are not feeling better in the next week, your pain gets worse, you cannot keep anything down, or you have other symptoms that worry you.

## 2021-06-03 NOTE — Unmapped (Signed)
Screening BP:  139/73  83    Omron BPs  BP#1   138/70  82  BP#2   135/66  80  BP#3   131/62  81    Average BP:    131/66  81

## 2021-06-03 NOTE — Unmapped (Signed)
Notified by CMA Cindy patient just seen in [facility] and needs follow up appointment in one month   Please call patient and schedule TY    FYI Dr. Coralee North

## 2021-06-04 NOTE — Unmapped (Signed)
I saw and evaluated the patient, participating in the key portions of the service.  I reviewed the resident’s note.  I agree with the resident’s findings and plan. Christopher Glasscock R Ciena Sampley, MD

## 2021-06-17 NOTE — Unmapped (Signed)
[facility] Clinical Assessment & Refill Coordination Note    William Keller, DOB: [birth year]  Phone: [phone] (home)     All above HIPAA information was verified with patient's family member, wife.     Was a Nurse, learning disability used for this call? No    Specialty Medication(s):   General Specialty: Praluent     Current Outpatient Medications   Medication Sig Dispense Refill   ??? acetaminophen (TYLENOL EXTRA STRENGTH) 500 MG tablet Take 1 tablet (500 mg total) by mouth two (2) times a day. May try with 1 aleve for acute pain. 100 tablet 2   ??? albuterol 2.5 mg /3 mL (0.083 %) nebulizer solution Inhale 3 mL (2.5 mg total) by nebulization every six (6) hours as needed for wheezing. (Patient not taking: Reported on 05/12/2021) 3 mL 1   ??? albuterol HFA 90 mcg/actuation inhaler Inhale 2 puffs every six (6) hours as needed for wheezing. 8 g 3   ??? alirocumab (PRALUENT) 150 mg/mL subcutaneous injection Inject the contents of one pen (150 mg total) under the skin every fourteen (14) days. (Patient not taking: Reported on 05/02/2021) 6 mL 3   ??? ASPIRIN LOW DOSE ORAL Take by mouth.     ??? budesonide-formoteroL (SYMBICORT) 160-4.5 mcg/actuation inhaler Inhale 2 puffs Two (2) times a day. (Patient taking differently: Inhale 2 puffs two (2) times a day as needed.) 6 g 3   ??? clobetasoL (TEMOVATE) 0.05 % cream Apply topically Two (2) times a day. 30 g 0   ??? COVID-19 antigen test (COVID-19 AT-HOME TEST) Kit 2 kits by Miscellaneous route daily as needed. (Patient not taking: Reported on 05/12/2021) 2 kit 1   ??? empty container (SHARPS-A-GATOR DISPOSAL SYSTEM) Misc Use as directed for sharps disposal 1 each 2   ??? fluocinonide (LIDEX) 0.05 % external solution Apply topically Two (2) times a day. 60 mL 1   ??? fluticasone propionate (FLONASE) 50 mcg/actuation nasal spray 2 sprays into each nostril daily. 16 g 11   ??? hydrocortisone (CORTEF) 5 MG tablet Take 1 tablet by mouth two (2) times a day. With breakfast and lunch     ??? insulin NPH-insulin regular, 70/30, (HUMULIN/NOVOLIN) 100 unit/mL (70-30) injection Inject 0.18 mL (18 Units total) under the skin Two (2) times a day. 20 mL 3   ??? insulin syr/ndl U100 half mark 0.5 mL 31 gauge x 15/64 (6 mm) Syrg by Miscellaneous route.      ??? ketoconazole (NIZORAL) 2 % shampoo WASH SCALP 3 TIMES A WEEK. LEAVE IT IN PLACE FOR 30 MINUTES BEFORE RINSING     ??? meclizine (ANTIVERT) 12.5 mg tablet Take 1 tablet (12.5 mg total) by mouth Three (3) times a day as needed (vertigo). 30 tablet 2   ??? neomycin-polymyxin-hydrocortisone (CORTISPORIN) 3.5-10,000-1 mg/mL-unit/mL-% otic solution Administer 3 drops into both ears Four (4) times a day. As needed for ear itching. 10 mL 0   ??? testosterone 1.62 % (20.25 mg/1.25 gram) GlPk USE ONE PACKET - 20.25 MG DAILY. APPLY TO SHOULDER 60 g 0     No current facility-administered medications for this visit.        Changes to medications: Llewelyn reports no changes at this time.    Allergies   Allergen Reactions   ??? Glipizide Shortness Of Breath   ??? Actos [Pioglitazone]      Pt states it gave him a heart attack   ??? Crestor [Rosuvastatin] Other (See Comments)     indigestion   ???  Esomeprazole Magnesium Other (See Comments)     Makes patients heart burn worse   ??? Esomeprazole Magnesium Other (See Comments)     Makes patients heart burn worse  Other reaction(s): Other (See Comments)  Makes patients heart burn worse  Makes patients heart burn worse  Makes patients heart burn worse     ??? Liraglutide Other (See Comments)     bloating   ??? Metformin      Hospitalized for shortness of breath/asthma.     ??? Midazolam Other (See Comments)     Doesn't do well with after effects    ??? Sitagliptin      Other reaction(s): JOINT PAIN   ??? Amoxicillin Nausea Only     Other reaction(s): NAUSEA   ??? Atorvastatin      indigestion   ??? Erythromycin Base Nausea Only     Other reaction(s): OTHER   ??? Glimepiride Rash     Other reaction(s): RASH   ??? Oxycodone Nausea Only     Pt states he was severely and acutely nauseated and bloated        Changes to allergies: No    SPECIALTY MEDICATION ADHERENCE     Praluent 150 mg/ml: 9 days of medicine on hand       Medication Adherence    Patient reported X missed doses in the last month: 0  Specialty Medication: Praluent 150 mg/mL  Informant: spouse          Specialty medication(s) dose(s) confirmed: Regimen is correct and unchanged.     Are there any concerns with adherence? No    Adherence counseling provided? Not needed    CLINICAL MANAGEMENT AND INTERVENTION      Clinical Benefit Assessment:    Do you feel the medicine is effective or helping your condition? Yes    Clinical Benefit counseling provided? Not needed    Adverse Effects Assessment:    Are you experiencing any side effects? No    Are you experiencing difficulty administering your medicine? No    Quality of Life Assessment:       How many days over the past month did your hyperlipidemia  keep you from your normal activities? For example, brushing your teeth or getting up in the morning. 0    Have you discussed this with your provider? Not needed    Acute Infection Status:    Acute infections noted within Epic:  No active infections  Patient reported infection: None    Therapy Appropriateness:    Is therapy appropriate and patient progressing towards therapeutic goals? Yes, therapy is appropriate and should be continued    DISEASE/MEDICATION-SPECIFIC INFORMATION      For patients on injectable medications: Patient currently has 0 doses left.  Next injection is scheduled for 06/26/21.    PATIENT SPECIFIC NEEDS     - Does the patient have any physical, cognitive, or cultural barriers? No    - Is the patient high risk? No    - Does the patient require a Care Management Plan? No     SOCIAL DETERMINANTS OF HEALTH     At the [facility], we have learned that life circumstances - like trouble affording food, housing, utilities, or transportation can affect the health of many of our patients.   That is why we wanted to ask: are you currently experiencing any life circumstances that are negatively impacting your health and/or quality of life? Patient declined to answer    Social Determinants of  Health     Food Insecurity: Not on file   Tobacco Use: Medium Risk   ??? Smoking Tobacco Use: Former   ??? Smokeless Tobacco Use: Former   ??? Passive Exposure: Not on file   Transportation Needs: Not on file   Alcohol Use: Not on file   Housing/Utilities: Not on file   Substance Use: Not on file   Financial Resource Strain: Not on file   Physical Activity: Not on file   Health Literacy: Not on file   Stress: Not on file   Intimate Partner Violence: Not on file   Depression: Not at risk   ??? PHQ-2 Score: 2   Social Connections: Not on file       Would you be willing to receive help with any of the needs that you have identified today? Not applicable       SHIPPING     Specialty Medication(s) to be Shipped:   General Specialty: Praluent    Other medication(s) to be shipped: No additional medications requested for fill at this time     Changes to insurance: No    Delivery Scheduled: Yes, Expected medication delivery date: 06/20/21.     Medication will be delivered via Same Day Courier to the confirmed prescription address in [facility]    The patient will receive a drug information handout for each medication shipped and additional FDA Medication Guides as required.  Verified that patient has previously received a Conservation officer, historic buildings and a Surveyor, mining.    The patient or caregiver noted above participated in the development of this care plan and knows that they can request review of or adjustments to the care plan at any time.      All of the patient's questions and concerns have been addressed.    Camillo Flaming   [facility] Specialty Pharmacist

## 2021-06-20 MED FILL — PRALUENT PEN 150 MG/ML SUBCUTANEOUS PEN INJECTOR: SUBCUTANEOUS | 28 days supply | Qty: 2 | Fill #1

## 2021-07-05 MED ORDER — TESTOSTERONE 1.62 % (20.25 MG/1.25 GRAM) TRANSDERMAL GEL PACKET
0 refills | 0.00000 days
Start: 2021-07-05 — End: ?

## 2021-07-08 MED ORDER — TESTOSTERONE 1.62 % (20.25 MG/1.25 GRAM) TRANSDERMAL GEL PACKET
0 refills | 0.00000 days
Start: 2021-07-08 — End: ?

## 2021-07-12 MED ORDER — TESTOSTERONE 1.62 % (20.25 MG/1.25 GRAM) TRANSDERMAL GEL PACKET
1 refills | 0.00000 days | Status: CP
Start: 2021-07-12 — End: ?

## 2021-07-12 NOTE — Unmapped (Signed)
[facility] Refill Coordination Note    Specialty Medication(s) to be Shipped:   General Specialty: William Keller and Praluent    Other medication(s) to be shipped: No additional medications requested for fill at this time     William Keller, DOB: [birth year]  Phone: [phone] (home)       All above HIPAA information was verified with patient's family member, Spouse.     Was a Nurse, learning disability used for this call? No    Completed refill call assessment today to schedule patient's medication shipment from the [facility] [phone]).  All relevant notes have been reviewed.     Specialty medication(s) and dose(s) confirmed: Regimen is correct and unchanged.   Changes to medications: William Keller reports no changes at this time.  Changes to insurance: No  New side effects reported not previously addressed with a pharmacist or physician: None reported  Questions for the pharmacist: No    Confirmed patient received a Conservation officer, historic buildings and a Surveyor, mining with first shipment. The patient will receive a drug information handout for each medication shipped and additional FDA Medication Guides as required.       DISEASE/MEDICATION-SPECIFIC INFORMATION        For patients on injectable medications: Patient currently has 1 doses left.  Next injection is scheduled for 07/13/21.    SPECIALTY MEDICATION ADHERENCE     Medication Adherence    Patient reported X missed doses in the last month: 0  Specialty Medication: Praluent 150mg /ml  Patient is on additional specialty medications: No  Patient is on more than two specialty medications: No  Any gaps in refill history greater than 2 weeks in the last 3 months: no  Demonstrates understanding of importance of adherence: yes  Informant: spouse  Reliability of informant: reliable  Confirmed plan for next specialty medication refill: delivery by pharmacy  Refills needed for supportive medications: not needed              Were doses missed due to medication being on hold? No    PRALUENT PEN 150 mg/mL :14 days of medicine on hand       REFERRAL TO PHARMACIST     Referral to the pharmacist: Not needed      [facility]     Shipping address confirmed in Epic.     Delivery Scheduled: Yes, Expected medication delivery date: 07/25/21.     Medication will be delivered via Same Day Courier to the prescription address in Epic WAM.    William Keller   [facility] Specialty Technician

## 2021-07-12 NOTE — Unmapped (Signed)
Pt is requesting a referral to kidney stone clinic at [facility]. Please advise.

## 2021-07-12 NOTE — Unmapped (Signed)
Hi,     Jennifer(daughter) contacted the clinic regarding the following:    Her dad wants to be referred to stone clinic . Can you please call her back    Please contact Victorino Dike  at [phone]    Thank you,    [facility]  Urology Services  [phone]

## 2021-07-12 NOTE — Unmapped (Signed)
Received voice mail from patient's daughter, Cyndia Bent, requesting refill of testosterone prescription. She states pharmacy has been requesting this for days. Per chart review this script has been reordered and pended. Patient has upcoming appt in March. I called Ms Jimmey Ralph back and let her know the provider has been sent the refill for authorization.

## 2021-07-12 NOTE — Unmapped (Signed)
I called patient daughter back and let her know prescription again pended to provider.

## 2021-07-13 MED ORDER — TESTOSTERONE 1.62 % (20.25 MG/1.25 GRAM) TRANSDERMAL GEL PACKET
0 refills | 0 days
Start: 2021-07-13 — End: ?

## 2021-07-14 NOTE — Unmapped (Signed)
Please call patient daughter Victorino Dike she was waiting on a return call from you about her father. Thanks

## 2021-07-18 NOTE — Unmapped (Signed)
Ashli scheduled pt to see [facility]

## 2021-07-19 ENCOUNTER — Ambulatory Visit: Admit: 2021-07-19 | Discharge: 2021-07-20 | Payer: MEDICARE

## 2021-07-19 DIAGNOSIS — N200 Calculus of kidney: Principal | ICD-10-CM

## 2021-07-19 DIAGNOSIS — R109 Unspecified abdominal pain: Principal | ICD-10-CM

## 2021-07-19 DIAGNOSIS — R399 Unspecified symptoms and signs involving the genitourinary system: Principal | ICD-10-CM

## 2021-07-19 LAB — URINALYSIS WITH MICROSCOPY
BILIRUBIN UA: NEGATIVE
GLUCOSE UA: NEGATIVE
HYALINE CASTS: 1 /LPF (ref 0–1)
KETONES UA: NEGATIVE
NITRITE UA: NEGATIVE
PH UA: 5 (ref 5.0–9.0)
PROTEIN UA: NEGATIVE
RBC UA: 57 /HPF — ABNORMAL HIGH (ref <=3)
SPECIFIC GRAVITY UA: 1.013 (ref 1.003–1.030)
SQUAMOUS EPITHELIAL: <1 /HPF (ref 0–5)
UROBILINOGEN UA: <2
WBC UA: 3 /HPF — ABNORMAL HIGH (ref <=2)

## 2021-07-19 LAB — BASIC METABOLIC PANEL
ANION GAP: 3 mmol/L — ABNORMAL LOW (ref 5–14)
BLOOD UREA NITROGEN: 22 mg/dL (ref 9–23)
BUN / CREAT RATIO: 22
CALCIUM: 9.6 mg/dL (ref 8.7–10.4)
CHLORIDE: 106 mmol/L (ref 98–107)
CO2: 31 mmol/L (ref 20.0–31.0)
CREATININE: 1.01 mg/dL
EGFR CKD-EPI (2021) MALE: 76 mL/min/{1.73_m2} (ref >=60)
GLUCOSE RANDOM: 131 mg/dL (ref 70–179)
POTASSIUM: 5 mmol/L — ABNORMAL HIGH (ref 3.4–4.8)
SODIUM: 140 mmol/L (ref 135–145)

## 2021-07-19 MED ORDER — TAMSULOSIN 0.4 MG CAPSULE
ORAL_CAPSULE | Freq: Every evening | ORAL | 1 refills | 30 days | Status: CP
Start: 2021-07-19 — End: 2021-09-17

## 2021-07-19 NOTE — Unmapped (Signed)
Lm to schedule CT today, Berkley Harvey is in referrals.

## 2021-07-19 NOTE — Unmapped (Signed)
ANALYSIS AND PLAN:  This is a 81 y.o.male with a previous history of uric acid stones.  The patient was noted with bilateral non-obstructing kidney stones on recent RUS 04/10/21.   Today he mentions that he has had pesistent right flank pain since surgery and is concerned for lingering kidney stones.   Continues to take 15 meq potassium citrate previosuly prescribed by local urologist.    Today we discussed general stone prevention measures along with benefit of 24-hour urine collection to help tailor stone prevention strategies specific to him.    Will plan to do CT scan next available to assess for obstructing stone burden and UA/UC today to rule out infection.     Recommended:  Increased hydration,  Strain urine  Start flomax, to help with potential passsgae of ?stone  Acetaminophen/ibuprofen PRN for pain  Advised to seek immediate medical attention for fever/chills, n/v, or significant pain.      ADDENDUM:  07/20/21 1018AM, Called patient to discuss results of NCCT scan (07/19/21), spoke to wife, CT does not indicate acute stone at this time. Does show persistent non-obstructing stone burden to left kidney approximately 8 mm.  He was noted to have microhematuria 07/19/21 UA, 57 RBCs/HPF with negative UC.  Given his history of bladder cancer, recommended he reach out to his local urologist for discussion of local cystoscopy. Last cystoscopy per local urologist on 01/04/21, negative.        CHIEF COMPLAINT:  History of nephrolithiasis, here for follow-up.    BRIEF HISTORY OF PRESENT ILLNESS:  William Keller is a very pleasant  81 y.o. male with a history of BPH s/p urolift,  prior nephrolithiasis. The patient underwent a right percutaneous nephrolithotomy on 02/21/21 with Dr. Dow Adolph, and now returns to urology clinic for follow-up.  Since last seen, he notes that he has been doing relatively well.  The patient denies any stone pain or stone passage.  Today he denies any lower urinary tract symptoms, specifically of vertigo, mentions his PCP is aware.    Denies history gout, does mention that his father and brother both had gout.  Denies chronic diarrhea,.  Denies calcium/vitamin D supplementation  Notes positive family history of kidney stones in his daughter.   Reports that he was previously on potassium citrate prescribed by local urologist but discontinued due to GI upset.     Per review of records, he presented to ED on 04/10/21 with right flank pain, mentions he though he had a UTI. UC: showed no growth. UA: 4 RBCs.  Today he feels better.  He mentions he would prefer to hold off on any future surgery given that he had prolonged recovery phase with his last PCNL.    Of note he follows with local urologist for BPH/LUTS s/p urolift 2021 and history of bladder CA, s/p TURBT 06/12/16    01/04/21: negative cysto   01/15/21 CTU: noted Bilateral nephrolithiasis, right greater than left without hydronephrosis. Numerous bilateral renal cysts an additional subcentimeter low-attenuation lesions that are too small to characterize on CT. No suspicious renal lesion identified      Interval 07/19/21:  81 y.o. male with history of   ***    Plan:  UA/UC  Ct scan    ***    [phone]    post-op right PCNL 02/21/21  removed stent?  CT in 2 weeks after stent removal, did not have it done. may need this still if he continues to have flank pain.  Dow Adolph had plans for Left  mini PCNL, pt opted to wait.  04/10/21 4 RBCs    has been having right flank pain, presented to ED 04/10/21  hx bladder CA, s/p TURBT 06/12/16  neg cysto 01/04/21      new flank pain.    tioday he mentions that he has been having right flank pain for sevral months asicne his surgery.  Denies fever/chills, nausea/vomting, hematuria, or dysuria.             PAST MEDICAL HISTORY, SURGICAL HISTORY, FAMILY HISTORY, SOCIAL  HISTORY, MEDICATIONS AND ALLERGIES:  Otherwise, unchanged from  before.    REVIEW OF SYSTEMS:  Completed by the patient and reviewed by me  today.  All OF SYSTEMS:  Completed by the patient and reviewed by me  today.  All pertinent positives discussed in history present  illness.    PHYSICAL EXAMINATION:  VITAL SIGNS:  BP 130/65  - Pulse 68  - Temp 37 ??C (98.6 ??F) (Temporal)  - Resp 20  - Wt 100.2 kg (221 lb)  - BMI 32.64 kg/m??   GENERAL:  Stable.  No apparent distress.  ABDOMEN:  Soft, nontender, nondistended, right CVA tenderness.    LABORATORY TESTING:  Previous stone analysis was 100% Uric acid on 02/21/21.        Chemistry        Component Value Date/Time    NA 139 05/02/2021 0851    NA 137 01/09/2014 0911    K 4.5 05/02/2021 0851    K 4.6 01/09/2014 0911    CL 102 05/02/2021 0851    CL 100 01/09/2014 0911    CO2 28.4 05/02/2021 0851    CO2 28 01/09/2014 0911    BUN 22 05/02/2021 0851    BUN 10 01/09/2014 0911    CREATININE 0.92 05/02/2021 0851    CREATININE 1.1 01/15/2021 1406    GLU 135 05/02/2021 0851        Component Value Date/Time    CALCIUM 9.7 05/02/2021 0851    CALCIUM 9.4 01/09/2014 0911    ALKPHOS 74 05/12/2021 1325    AST 14 05/12/2021 1325    AST 37 07/20/2014 0916    ALT 28 05/12/2021 1325    ALT 23 07/20/2014 0916    BILITOT 0.4 05/12/2021 1325            01/15/21 CTU:  IMPRESSION:  1. Bilateral nephrolithiasis and numerous bilateral renal cysts.  2. Mild nonspecific thickening of the urinary bladder which may be related to underdistention, chronic bladder outlet obstruction and/or cystitis. Clinical correlation is advised.  3. Hepatic steatosis.  4. Additional findings, as described above.      04/10/21 RUS:  IMPRESSION:  -No hydronephrosis.  -Bilateral renal cysts and renal calculi, seen on prior CT.  -Partially visualized echogenic liver suggesting hepatic steatosis.

## 2021-07-19 NOTE — Unmapped (Addendum)
Maintain good hydration, at least 1.5 liters of fluids per day.  Continue to drink lemonade, every day.  Continue with potassium citrate medication.  Strain your urine.     Will send prescription for flomax to help with any stone passage.  Will schedule CT scan to check for kidney stones.  Will also check some blood work and urine today.    Can use ibuprofen and tylenol to help manage pain, alternate doses every 4 hours, do not take more than indicated on the box/bottle. Take with food.       If at any point you develop fever/chills, nausea/vomiting, and significant pain, please seek immediate medical attention.

## 2021-07-20 NOTE — Unmapped (Signed)
Spoke to patient's wife regarding CT results.    07/19/21 CT Scan:  IMPRESSION:  1. Resolution of previously seen right-sided renal stones following percutaneous nephrostolithotomy.  2. Persistent and unchanged left-sided renal stone measuring up to 0.8 cm.  2. Additional chronic and incidental findings as above.    Mentioned that I do not think his right flank pain can be attributed to kidney stones.  He is still noted to have left-sided non-obstructing stone burden, and can opt to have stones treated if he would like but doubt that this would help with the right-sided pain.    Mentioned that he should speak to his PCP to assess for alternate causes for his pain.    Additionally, mentioned that he was noted to have blood in his urine. UC is still pending but if this does not grow anything, would recommend he have a repeat cystoscopy. He was being followed by [place] [facility] u[facility] for surveillance of bladder cancer and is s/p TURB in 06/12/16.  Most recent cystoscopy (per notes or care everywhere) was done 01/04/21 which was negative.     Wife mentions she will pass on information and will also have her husband call us back when he is free.      Lab Results   Component Value Date    CREATININE 1.01 07/19/2021    CREATININE 0.92 05/02/2021    CREATININE 0.88 04/10/2021    CREATININE 0.83 02/09/2021    CREATININE 1.1 01/15/2021     UA:

## 2021-07-22 NOTE — Unmapped (Signed)
Abstraction Result Flowsheet Data    This patient's last AWV date: [facility] Last Medicare Wellness Visit Date: 05/26/2020  This patients last WCC/CPE date: : 06/25/2019      Reason for Encounter  Reason for Encounter: Outreach  Primary Reason for Outreach: AWV  Text Message: No  MyChart Message: No  Outreach Call Outcome: No voicemail available                                        Dear William Keller,    [facility] is committed to helping you stay healthy. Our records show you are due for a Medicare Annual Wellness Visit.    A benefit for anyone with Medicare, this visit is designed to help prevent illness based on your current health and risk factors--at no cost to you.     An Annual Wellness Visit may include education or counseling about immunizations, and important health measurements such as blood pressure checks, screenings, and referrals for other care if needed.    Please reply to this message with the word ???SCHEDULE,??? and we will contact you to schedule your appointment.    Thank you for the opportunity to care for you.    Ericka Pontiff, MD  [facility]

## 2021-07-25 MED FILL — PRALUENT PEN 150 MG/ML SUBCUTANEOUS PEN INJECTOR: SUBCUTANEOUS | 28 days supply | Qty: 2 | Fill #2

## 2021-07-29 ENCOUNTER — Ambulatory Visit: Admit: 2021-07-29 | Discharge: 2021-07-30 | Payer: MEDICARE

## 2021-07-31 NOTE — Unmapped (Addendum)
Try decreasing or stopping the ibuprofen     You can take tylenol 1000 mg three times a day    You can try over the counter lidocaine patch (12 hours on and remove at night)    Referral to physical therapy at the [facility]    Please Call to set up next appointment with Dr. Riccardo Dubin    Address: [address], [place], [place] [address]  Phone: [phone]    Start Wegovy:  Initial: 0.25 mg once weekly for 4 weeks, then increase to 0.5 mg once weekly.    If you develop low blood sugars less than 80, decrease NPH by 2 units twice daily (4 units total) and call me      If you have any questions, call the [facility] at [phone] during regular business hours, or send me a message through MyChart. Please allow 2 business days for responses to [employer]. For urgent concerns after hours and weekends, please call the [facility] at [phone].    Kelli Churn, MD

## 2021-07-31 NOTE — Unmapped (Signed)
[facility] Return Visit    Assessment/Plan:     William Keller is a 81 y.o. male presenting to [facility] for follow up.    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  He is on NPH 18 units BID. His BS vary based on what he is eating.  He does not remember taking Victoza/GLP agonist and wants to try [facility] as his daughter is on it.  Fasting < 150, occ 120.  No hypoglycemia.  1-2 hours after dinner it can be over 200.  We will start Wegovy 0.25 mg weekly and plan to increase in 1 month to 0.5 mg weekly if tolerating.  Counseled on possible SE.  He wants to try to establish with endocrinology in [place], referral placed.  He will get his retinal eye exam with outside provider (he mentioned he might have early cataracts)  ??  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 ??I encouraged adherence to Symbicort and this will help [place] wean his Cortef and now down to 5 mg once daily. He completed the PFTs and will see pulmonology next week.  ??  Long term use of glucocorticoid therapy: Remote history of holistic doctor starting him on Cortef 5 mg TID for COPD and now down to 5 mg daily. [facility] Kitchen DEXA scan in 08/2020, normal bone density.    ??  CAD: Hx of RCA stent.  Could not tolerate past low-dose statin therapy. He is now on ASA 81 mg daily. Did not tolerate Zetia and now on Repatha.    ??MSK pain in right lateral back 2/2 degenerative changes, he will call to make follow up with spine center.  I placed referral for [facility] PT at the spine center.  Counseled on cutting back NSAIDs and on regimen of tylenol/lidocaine patch.    Nephrolithiasis: He is s/p percutaneous nephrostolithotomy and stones resolved.  Pain has improved on the right flank.    ??  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  Hx of bladder cancer,??s/p transuretheral resection in 2018    Primary hypogonadism: ??On testosterone managed by [facility] endocrinology  ??  GERD: s/p fundiplication and continues OTC antacids and omeprazole.   ??  HCM: Discuss Shingrix in follow up.  Colonoscopy normal 2021. Hearing testing completed and has hearing aids.   ??  ACP: FULL code, but does not want prolonged life support.  Surrogate decision maker is his wife William Keller  Orders Placed This Encounter   Procedures   ??? Ambulatory referral to Physical Therapy   ??? Ambulatory referral to Endocrinology         RTC: Return in about 4 months (around 12/01/2021).    ------------------------------------------------------------------------------------------------------------  Subjective     HPI: William Keller is a 81 y.o. male who returns to [facility] for follow up.  Please see HPI as per A&P above.  He comes to the visit today with his wife William Keller.       ROS: Pertinent positives and negatives are documented as per the HPI.    I have reviewed and (if needed) updated the patient's problem list, medications, allergies, past medical and surgical history, social and family history in the EMR.    Objective     Physical Exam   Vitals:   Vitals:    08/01/21 0827   BP:  135/76   Pulse: 68     Wt Readings from Last 3 Encounters:   08/01/21 (!) 102.5 kg (226 lb)   07/19/21 100.2 kg (221 lb)   06/03/21 97.5 kg (215 lb)     Gen: tired but in no distress  Eyes: conjunctiva clear, no lower lid pallor  Resp: Normal WOB, CTAB and no adventitious sounds  CV: RRR, no murmurs or gallops, distal pulses 2+ symmetric, 1+ pitting edema at ankles  GI: ND, normoactive BS, soft and no tenderness.   MSK: pain to palpation of right lower back in the muscular area.  No spinal tenderness.      PHQ-2 Score:         Labs and Studies in EMR and Reviewed    I personally spent 36 minutes face-to-face and non-face-to-face in the care of this patient, which includes all pre, intra, and post visit time on the date of service.  All documented time was specific to the E/M visit and does not include any procedures that may have been performed.

## 2021-08-01 ENCOUNTER — Ambulatory Visit: Admit: 2021-08-01 | Discharge: 2021-08-02 | Payer: MEDICARE | Attending: Internal Medicine | Primary: Internal Medicine

## 2021-08-01 DIAGNOSIS — M5030 Other cervical disc degeneration, unspecified cervical region: Principal | ICD-10-CM

## 2021-08-01 DIAGNOSIS — J45909 Unspecified asthma, uncomplicated: Principal | ICD-10-CM

## 2021-08-01 DIAGNOSIS — J410 Simple chronic bronchitis: Principal | ICD-10-CM

## 2021-08-01 DIAGNOSIS — M539 Dorsopathy, unspecified: Principal | ICD-10-CM

## 2021-08-01 DIAGNOSIS — Z794 Long term (current) use of insulin: Principal | ICD-10-CM

## 2021-08-01 DIAGNOSIS — N500 Atrophy of testis: Principal | ICD-10-CM

## 2021-08-01 DIAGNOSIS — E1142 Type 2 diabetes mellitus with diabetic polyneuropathy: Principal | ICD-10-CM

## 2021-08-01 MED ORDER — WEGOVY 0.25 MG/0.5 ML SUBCUTANEOUS PEN INJECTOR
3 refills | 0 days | Status: CP
Start: 2021-08-01 — End: ?

## 2021-08-08 ENCOUNTER — Ambulatory Visit: Admit: 2021-08-08 | Discharge: 2021-08-09 | Payer: MEDICARE

## 2021-08-08 DIAGNOSIS — J410 Simple chronic bronchitis: Principal | ICD-10-CM

## 2021-08-08 DIAGNOSIS — J449 Chronic obstructive pulmonary disease, unspecified: Principal | ICD-10-CM

## 2021-08-08 MED ORDER — ALBUTEROL SULFATE HFA 90 MCG/ACTUATION AEROSOL INHALER
Freq: Four times a day (QID) | RESPIRATORY_TRACT | 3 refills | 0 days | Status: CP | PRN
Start: 2021-08-08 — End: 2022-08-08

## 2021-08-08 MED ORDER — FLUTICASONE PROPIONATE 230 MCG-SALMETEROL 21 MCG/ACTUATION HFA INHALER
Freq: Two times a day (BID) | RESPIRATORY_TRACT | 11 refills | 0 days | Status: CP
Start: 2021-08-08 — End: 2022-08-08
  Filled 2021-08-23: qty 12, 30d supply, fill #0

## 2021-08-08 MED ORDER — BUDESONIDE-FORMOTEROL HFA 160 MCG-4.5 MCG/ACTUATION AEROSOL INHALER
Freq: Two times a day (BID) | RESPIRATORY_TRACT | 3 refills | 18 days | Status: CP
Start: 2021-08-08 — End: 2021-08-08

## 2021-08-08 MED ORDER — ALBUTEROL SULFATE 2.5 MG/3 ML (0.083 %) SOLUTION FOR NEBULIZATION
Freq: Four times a day (QID) | RESPIRATORY_TRACT | 1 refills | 1 days | Status: CP | PRN
Start: 2021-08-08 — End: 2022-08-09

## 2021-08-08 NOTE — Unmapped (Signed)
TO DO:  - Call [employer] to get your Advair for free  [phone] and tell them you want your inhaler delivered.  - Send [place] a message or call us if you have problems.

## 2021-08-08 NOTE — Unmapped (Signed)
[facility] Pharmacist Visit     William Keller is a 81 y.o. male being seen by Allayne Butcher, MD for a new patient visit.    Chief Complaint/Reason for referral: Establishing care    OUTPATIENT MEDICATION LIST     Current Outpatient Medications on File Prior to Visit   Medication Sig   ??? acetaminophen (TYLENOL EXTRA STRENGTH) 500 MG tablet Take 1 tablet (500 mg total) by mouth two (2) times a day. May try with 1 aleve for acute pain.   ??? albuterol 2.5 mg /3 mL (0.083 %) nebulizer solution Inhale 3 mL (2.5 mg total) by nebulization every six (6) hours as needed for wheezing. (Patient not taking: Reported on 05/12/2021)   ??? albuterol HFA 90 mcg/actuation inhaler Inhale 2 puffs every six (6) hours as needed for wheezing.   ??? alirocumab (PRALUENT) 150 mg/mL subcutaneous injection Inject the contents of one pen (150 mg total) under the skin every fourteen (14) days.   ??? ASPIRIN LOW DOSE ORAL Take by mouth.   ??? budesonide-formoteroL (SYMBICORT) 160-4.5 mcg/actuation inhaler Inhale 2 puffs Two (2) times a day. (Patient taking differently: Inhale 2 puffs two (2) times a day as needed.)   ??? citric acid-potassium citrate (POLYCITRA K) 1,100-334 mg/5 mL solution Take 15 mEq by mouth Three (3) times a day with a meal.   ??? clobetasoL (TEMOVATE) 0.05 % cream Apply topically Two (2) times a day.   ??? COVID-19 antigen test (COVID-19 AT-HOME TEST) Kit 2 kits by Miscellaneous route daily as needed.   ??? empty container (SHARPS-A-GATOR DISPOSAL SYSTEM) Misc Use as directed for sharps disposal   ??? fluocinonide (LIDEX) 0.05 % external solution Apply topically Two (2) times a day.   ??? fluticasone propionate (FLONASE) 50 mcg/actuation nasal spray 2 sprays into each nostril daily.   ??? hydrocortisone (CORTEF) 5 MG tablet Take 1 tablet by mouth in the morning. With breakfast and lunch.   ??? insulin NPH-insulin regular, 70/30, (HUMULIN/NOVOLIN) 100 unit/mL (70-30) injection Inject 0.18 mL (18 Units total) under the skin Two (2) times a day.   ??? insulin syr/ndl U100 half mark 0.5 mL 31 gauge x 15/64 (6 mm) Syrg by Miscellaneous route.    ??? ketoconazole (NIZORAL) 2 % shampoo WASH SCALP 3 TIMES A WEEK. LEAVE IT IN PLACE FOR 30 MINUTES BEFORE RINSING   ??? neomycin-polymyxin-hydrocortisone (CORTISPORIN) 3.5-10,000-1 mg/mL-unit/mL-% otic solution Administer 3 drops into both ears Four (4) times a day. As needed for ear itching.   ??? tamsulosin (FLOMAX) 0.4 mg capsule Take 1 capsule (0.4 mg total) by mouth nightly.   ??? testosterone 1.62 % (20.25 mg/1.25 gram) GlPk USE ONE PACKET - 20.25 MG DAILY. APPLY TO SHOULDER   ??? WEGOVY 0.25 MG/0.5 ML SUBCUTANEOUS PEN INJECTOR Initial: 0.25 mg once weekly for 4 weeks, then increase to 0.5 mg once weekly.     No current facility-administered medications on file prior to visit.       PULMONARY MEDICATIONS     Current pulmonary medications:   ??? Albuterol nebulizer - not using very often, but has on hand   ??? Albuterol HFA inhaler - PRN, patient reports doing 1 puff BID  ??? Symbicort 160-4.5 - 2 puffs BID  ??? Nasacort - PRN, patient reports occasional use  ??? Hydrocortisone - 5 mg once daily (previously added by other physician)    Previously tried pulmonary medications:   ??? Advair - no side effects noted/preference   ??? Spiriva Handihaler - no side effects  noted     Other considerations:  ??? Antibiotic use: Azithromycin, but for reported GI symptoms  ??? Oral steroid use: Chronic hydrocortisone use (4 year history), currently weaning off  ??? If on ICS, rinse + spit after use? Patient reported that he was not doing this, reinforced  ??? If applicable, spacer? Yes  ??? Smoking History: Remote 50 pack year history (quit in 1992) per chart review    MEDICATION MANAGEMENT   ??? Medications Management: Patient manages own medications  ??? Adherence: Excellent  ??? Access: Has issues affording medications, specifically with Symbicort   ??? New Prescriptions: N/A  ??? Preferred Pharmacy: [facility] [place]    ASSESSMENT/PLAN:    ??? Current regimen consists of SABA, ICS, LABA, chronic oral steroid  and Intranasal steroid   o Current total daily ICS prescribed is medium dose  ??? Based on report of albuterol use and exacerbation frequency in last year, asthma/COPD control is uncontrolled  ??? The following was reviewed with the patient:  correct inhaler technique  ??? Follow-up needed with CPP: Message sent to Caesar Chestnut for patient enrollment in [employer] Patient Assistance Program. Likely may have already met $600 cap with expensive medications.     I spent a total of 10 minutes face to face with the patient delivering clinical care and providing education/counseling. Medications reviewed in EPIC medication station and updated today by the clinical pharmacist practitioner.     Recommendations and medication-related problems were discussed directly with pulmonologist, Allayne Butcher, MD.    Electronically signed:  Newman Nickels, PharmD Candidate    I am located on-site and the patient is located on-site for this visit.      Clinical Pharmacist Attestation     I reviewed the PharmD Candidate's note and agree with documentation.    Cosigned by:   Prince Solian, PharmD, CPP  Clinical Pharmacist Practitioner  [facility] Adult Cystic Fibrosis/Pulmonary Clinic  [phone]    August 08, 2021 4:27 PM.

## 2021-08-08 NOTE — Unmapped (Signed)
Pulmonary Clinic - Initial Visit    Referring Physician :  [name]*  PCP:     Ericka Pontiff, MD  Reason for Consult:   COPD      ASSESSMENT and PLAN     William Keller is a 81 y.o. male with  GERD s/p gastric wrap, sinus disease, DM2, psoriasis, obesity, CAD presenting for evaluation of COPD.    COPD: 50 pack year smoking history, quit 1992. Mild obstruction, no restriction, significant BDR. Given significant responsiveness, may border on ACOS.   - Inhalers: Symbicort 2 puffs BID with spacer switch to Advair HFA high dose for maufacturer assistance, prn albuterol  - Exacerbations: never  - Pulmonary Rehab: not done  - Lung Cancer Screening: not indicated  - Vaccines: utd  - Hypercarbia/NIPPV screen: not done  - Advanced therapies? Too well    Allergic Rhinitis:  - Cont Flonase + prn allergy meds    Lung Cancer Screening: Abnormal CT chest in [facility] in 2018. CT 05/13/21 ??No suspicious pulmonary nodules, as clinically questioned. Scattered calcified pulmonary granulomas. Posttraumatic changes of the left chest wall and left lower lobe with small left lung herniation, stable dating back to 2016. Not indicated    Snoring  - Reports negative PSG, but did not complete      Ishan was seen today for new patient.    Diagnoses and all orders for this visit:    Chronic obstructive pulmonary disease, unspecified COPD type (CMS-HCC)  -     Nebulizers and Supplies - HME; Future    Simple chronic bronchitis (CMS-HCC)  -     Discontinue: budesonide-formoteroL (SYMBICORT) 160-4.5 mcg/actuation inhaler; Inhale 2 puffs Two (2) times a day.  -     albuterol 2.5 mg /3 mL (0.083 %) nebulizer solution; Inhale 3 mL (2.5 mg total) by nebulization every six (6) hours as needed for wheezing.    Other orders  -     albuterol HFA 90 mcg/actuation inhaler; Inhale 2 puffs every six (6) hours as needed for wheezing.  -     fluticasone propion-salmeteroL (ADVAIR HFA) 230-21 mcg/actuation inhaler; Inhale 2 puffs Two (2) times a day.        Plan of care was discussed with the patient who acknowledged understanding and is in agreement.    Patient will return to clinic in 6 months or sooner if needed.    This patient was seen and discussed with attending physician, Dr. Emeline Darling who agrees with the assessment and plan above.     [id number] Zella Richer*, Ericka Pontiff, MD    Burman Riis Ndeye Tenorio MD  Pulmonary and Critical Care Fellow  08/08/21   ~~~~~    HISTORY:     History of Present Illness:  Mr. Low is a 81 y.o. male with a history of the above whom we are seeing in consultation requested by [name]* for evaluation of COPD.    I haven't sen a pulmonologist in a few years, and I started wheezing a month ago.    Was put on hydrocortisone a few years ago and this helped his breathing.     50 pack year smoking history, quit all tobacco 1992.     Has a cough in the morning. Occasionally has sneezing attacks.   Short of breath with activity. Hardest activity in last 2 weeks was flooring in the bathroom. Has a lot of back pain that limits his activity.     Trying to lose weight  with his primary care doctor.     Has GERD, but controlled well now. Never exacerbations.     Holistic doctor put him on Cortef 5mg  TID but he has weaned down.     Has lots of allergies, especially to medicines.     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 Raised cows and chickens. DDog at home. No birds in the house.     Past Medical History:  Past Medical History:   Diagnosis Date   ??? Arthritis    ??? Asthma    ??? Cancer (CMS-HCC)    ??? Diabetes mellitus (CMS-HCC)    ??? Hearing impairment     hearing aids   ??? Hyperlipidemia    ??? Kidney stone    ??? Myocardial infarction (CMS-HCC)    ??? Peripheral neuropathy    ??? Visual impairment     cataract sx 05/27/20     Past Surgical History:   Procedure Laterality Date   ??? CATARACT EXTRACTION     ??? CATARACT EXTRACTION, BILATERAL     ??? CHG ULTRASONIC GUIDANCE, INTRAOPERATIVE Right 02/21/2021    Procedure: ULTRASONIC GUIDANCE, INTRAOPERATIVE;  Surgeon: Vickii Penna, MD;  [facility];  Service: Urology   ??? esophaus rap surgery     ??? PR CATH PLACE/CORON ANGIO, IMG SUPER/INTERP,W LEFT HEART VENTRICULOGRAPHY N/A 03/31/2015    Procedure: Left Heart Catheterization;  Surgeon: Joanie Coddington, MD;  Location: [facility] CATH;  Service: Cardiology   ??? PR COLONOSCOPY FLX DX W/COLLJ SPEC WHEN PFRMD N/A 08/29/2019    Procedure: COLONOSCOPY, FLEXIBLE, PROXIMAL TO SPLENIC FLEXURE; DIAGNOSTIC, W/WO COLLECTION SPECIMEN BY BRUSH OR WASH;  Surgeon: Leland Her, MD;  Location: [facility] GI PROCEDURES [facility];  Service: Gastroenterology   ??? PR PERCUT DRAIN/INJECT RENAL CYST Right 02/21/2021    Procedure: ASPIRATION AND/OR INJECTION OF RENAL CYST OR PELVIS BY NEEDLE, PERCUTANEOUS;  Surgeon: Vickii Penna, MD;  Location: [facility] OR [facility];  Service: Urology   ??? PR PERQ NL/PL LITHOTRP COMPLEX >2 CM MLT LOCATIONS Right 02/21/2021    Procedure: supine standard PCNL; need fortec;  Surgeon: Vickii Penna, MD;  Location: [facility] OR [facility];  Service: Urology   ??? PR PVB THORACIC SINGLE INJECTION SITE W/IMG GID Right 02/21/2021    Procedure: PARAVERTEBRAL BLOCK (PVB) (PARASPINOUS BLOCK), THORACIC; SINGLE INJECTION SITE (INCLUDES IMAGING GUIDANCE, WHEN PERFORMED);  Surgeon: Vickii Penna, MD;  Location[facility] OR [facility];  Service: Urology   ??? PR UPPER GI ENDOSCOPY,BIOPSY N/A 08/29/2019    Procedure: UGI ENDOSCOPY; WITH BIOPSY, SINGLE OR MULTIPLE;  Surgeon: Leland Her, MD;  Location: [facility] PROCED[facility];  Service: Gastroenterology   ??? removal for fatty tumors     ??? SINUS SURGERY     ??? SINUS SURGERY     ??? SKIN BIOPSY     ??? urolift         Other History:  The social history and family history were personally reviewed and updated in the patient's electronic medical record.    Family History   Problem Relation Age of Onset   ??? Cancer Mother         throat cancer   ??? Emphysema Father    ??? Melanoma Neg Hx    ??? Basal cell carcinoma Neg Hx    ??? Squamous cell carcinoma Neg Hx      Social History     Socioeconomic  History   ??? Marital status: Married   Tobacco Use   ??? Smoking status: Former     Packs/day: 1.00     Years: 20.00     Pack years: 20.00     Types: Cigarettes   ??? Smokeless tobacco: Former     Quit date: 1992   ??? Tobacco comments:     30 years ago   Vaping Use   ??? Vaping Use: Never used   Substance and Sexual Activity   ??? Alcohol use: Not Currently     Comment: rarely    ??? Drug use: No   ??? Sexual activity: Not Currently     Partners: Female   Other Topics Concern   ??? Do you use sunscreen? No   ??? Tanning bed use? Yes   ??? Are you easily burned? No   ??? Excessive sun exposure? Yes   ??? Blistering sunburns? Yes   ??? Exercise Yes     Comment: work day to day   ??? Living Situation No       Home Medications:  Current Outpatient Medications on File Prior to Visit   Medication Sig Dispense Refill   ??? acetaminophen (TYLENOL EXTRA STRENGTH) 500 MG tablet Take 1 tablet (500 mg total) by mouth two (2) times a day. May try with 1 aleve for acute pain. 100 tablet 2   ??? alirocumab (PRALUENT) 150 mg/mL subcutaneous injection Inject the contents of one pen (150 mg total) under the skin every fourteen (14) days. 6 mL 3   ??? ASPIRIN LOW DOSE ORAL Take by mouth.     ??? citric acid-potassium citrate (POLYCITRA K) 1,100-334 mg/5 mL solution Take 15 mEq by mouth Three (3) times a day with a meal.     ??? clobetasoL (TEMOVATE) 0.05 % cream Apply topically Two (2) times a day. 30 g 0   ??? COVID-19 antigen test (COVID-19 AT-HOME TEST) Kit 2 kits by Miscellaneous route daily as needed. 2 kit 1   ??? empty container (SHARPS-A-GATOR DISPOSAL SYSTEM) Misc Use as directed for sharps disposal 1 each 2   ??? fluocinonide (LIDEX) 0.05 % external solution Apply topically Two (2) times a day. 60 mL 1   ??? fluticasone propionate (FLONASE) 50 mcg/actuation nasal spray 2 sprays into each nostril daily. 16 g 11   ??? hydrocortisone (CORTEF) 5 MG tablet Take 1 tablet by mouth in the morning. With breakfast and lunch.     ??? insulin NPH-insulin regular, 70/30, (HUMULIN/NOVOLIN) 100 unit/mL (70-30) injection Inject 0.18 mL (18 Units total) under the skin Two (2) times a day. 20 mL 3   ??? insulin syr/ndl U100 half mark 0.5 mL 31 gauge x 15/64 (6 mm) Syrg by Miscellaneous route.      ??? ketoconazole (NIZORAL) 2 % shampoo WASH SCALP 3 TIMES A WEEK. LEAVE IT IN PLACE FOR 30 MINUTES BEFORE RINSING     ??? neomycin-polymyxin-hydrocortisone (CORTISPORIN) 3.5-10,000-1 mg/mL-unit/mL-% otic solution Administer 3 drops into both ears Four (4) times a day. As needed for ear itching. 10 mL 0   ??? tamsulosin (FLOMAX) 0.4 mg capsule Take 1 capsule (0.4 mg total) by mouth nightly. 30 capsule 1   ??? testosterone 1.62 % (20.25 mg/1.25 gram) GlPk USE ONE PACKET - 20.25 MG DAILY. APPLY TO SHOULDER 60 g 1   ??? WEGOVY 0.25 MG/0.5 ML SUBCUTANEOUS PEN INJECTOR Initial: 0.25 mg once weekly for 4 weeks, then increase to 0.5 mg once weekly. 2 mL 3     No  current facility-administered medications on file prior to visit.       Allergies:  Allergies as of 08/08/2021 - Reviewed 08/01/2021   Allergen Reaction Noted   ??? Glipizide Shortness Of Breath 08/04/2016   ??? Actos [pioglitazone]  10/10/2017   ??? Crestor [rosuvastatin] Other (See Comments) 12/27/2020   ??? Esomeprazole magnesium Other (See Comments) 07/20/2014   ??? Esomeprazole magnesium Other (See Comments) 06/12/2013   ??? Liraglutide Other (See Comments) 07/20/2014   ??? Metformin  09/20/2012   ??? Midazolam Other (See Comments) 12/31/2019   ??? Sitagliptin  09/20/2012   ??? Amoxicillin Nausea Only 09/20/2012   ??? Atorvastatin  09/17/2017   ??? Erythromycin base Nausea Only 09/20/2012   ??? Glimepiride Rash 09/20/2012   ??? Oxycodone Nausea Only 02/28/2021       Review of Systems:  A comprehensive review of systems was completed and negative except as noted in HPI.    PHYSICAL EXAM:   BP 128/76  - Pulse 80 - Temp 36.8 ??C (98.2 ??F) (Temporal)  - Resp 18  - Ht 175.3 cm (5' 9)  - Wt (!) 101.7 kg (224 lb 3.2 oz)  - SpO2 96%  - BMI 33.11 kg/m??   GEN: NAD, sitting in chair  EYES: EOMI, sclera anicteric  ENT: Trachea midline, MMM  CV: RRR, no murmurs appreciated  PULM: Faint wheezes bilaterally, but did not take Symbicort today, normal WoB, no stridor  ABD: soft, non-tender, non-distended  EXT: No edema  NEURO: Grossly Non-focal, moving all extremities normally  PSYCH: A+Ox3, appropriate  MSK: no obvious joint deformities of b/l hands      LABORATORY and RADIOLOGY DATA:     Pulmonary Function Tests/Interpretation:      Mild obstruction, no restriction, significant BDR.    Pertinent Laboratory Data:    Personally reviewed in EMR    Pertinent Imaging Data:  Personally reviewed in EMR

## 2021-08-10 NOTE — Unmapped (Signed)
Abstraction Result Flowsheet Data    This patient's last AWV date: [facility] Last Medicare Wellness Visit Date: 05/26/2020  This patients last WCC/CPE date: : 06/25/2019      Reason for Encounter  Reason for Encounter: Outreach  Primary Reason for Outreach: AWV  Text Message: No  Outreach Call Outcome: No voicemail available                                        Dear Willaim Rayas,    [facility] PRIMARY CARE AT [facility] is committed to helping you stay healthy. Our records show you are due for a Medicare Annual Wellness Visit.    A benefit for anyone with Medicare, this visit is designed to help prevent illness based on your current health and risk factors--at no cost to you.     An Annual Wellness Visit may include education or counseling about immunizations, and important health measurements such as blood pressure checks, screenings, and referrals for other care if needed.    Please reply to this message with the word ???SCHEDULE,??? and we will contact you to schedule your appointment.    Thank you for the opportunity to care for you.    Ericka Pontiff, MD  [facility] PRIMARY CARE AT [facility]

## 2021-08-10 NOTE — Unmapped (Signed)
Called William Keller and left patient message to call the Urology Clinic back to schedule the below appt(s):    ??? Provider: [facility]  ??? Type of Appt: new pt in the [facility], next available   ??? Radiology: n/a      Asked the patient to call back at [phone].    Thank you,    Nobie Putnam  Urology Clinic  [phone]

## 2021-08-12 ENCOUNTER — Ambulatory Visit
Admit: 2021-08-12 | Payer: MEDICARE | Attending: Rehabilitative and Restorative Service Providers" | Primary: Rehabilitative and Restorative Service Providers"

## 2021-08-12 MED ORDER — WEGOVY 0.25 MG/0.5 ML SUBCUTANEOUS PEN INJECTOR
3 refills | 0 days | Status: CP
Start: 2021-08-12 — End: ?

## 2021-08-12 NOTE — Unmapped (Signed)
[facility] Refill Coordination Note    Specialty Medication(s) to be Shipped:   General Specialty: Praluent    Other medication(s) to be shipped: Advair   230-65mcg/inhaler       William Keller, DOB: Jun 22, 1940  Phone: [phone] (home)       All above HIPAA information was verified with patient.     Was a Nurse, learning disability used for this call? No    Completed refill call assessment today to schedule patient's medication shipment from the [facility] [phone]).  All relevant notes have been reviewed.     Specialty medication(s) and dose(s) confirmed: Regimen is correct and unchanged.   Changes to medications: [name] reports starting the following medications: Advair  HFA  230-24mcg - Inhale  2  puffs  2 times a  day    Changes to insurance: No  New side effects reported not previously addressed with a pharmacist or physician: None reported  Questions for the pharmacist: No    Confirmed patient received a Conservation officer, historic buildings and a Surveyor, mining with first shipment. The patient will receive a drug information handout for each medication shipped and additional FDA Medication Guides as required.       DISEASE/MEDICATION-SPECIFIC INFORMATION        For patients on injectable medications: Patient currently has 0 doses left.  Next injection is scheduled for 08/27/21.    SPECIALTY MEDICATION ADHERENCE     Medication Adherence    Patient reported X missed doses in the last month: 0  Specialty Medication: PRALUENT PEN 150 mg/mL  Patient is on additional specialty medications: No  Patient is on more than two specialty medications: No  Any gaps in refill history greater than 2 weeks in the last 3 months: no              Were doses missed due to medication being on hold? No    PRALUENT PEN 150 mg/mL - 0 days of medicine on hand        REFERRAL TO PHARMACIST     Referral to the pharmacist: Not needed      [facility]     Shipping address confirmed in Epic.     Delivery Scheduled: Yes, Expected medication delivery date: 08/23/21 .     Medication will be delivered via Same Day Courier to the prescription address in Epic WAM.    [name]   [facility] Specialty Technician

## 2021-08-12 NOTE — Unmapped (Signed)
[facility] THERAPY SERVICES SPINE CTR [place]  OUTPATIENT PHYSICAL THERAPY  08/12/2021     Patient Name:  William Keller  Date of Birth:  04/08/1941  Diagnosis:    Encounter Diagnosis   Name Primary?   ??? Lumbar spondylosis Yes        Referred by:  Coralee North     Date Treatment Started,  Date Care Plan Established:  08/12/21    Chief complaint:  Right greater than left sided low back pain.  Date of onset:  Around 2020.        Assessment & Plan     Assessment details:   Visit #:  1.  This condition affects daily activities and quality of life which can ultimately impact overall health.  Requires physical therapy to establish individualized symptom relieving strategies, increase range of motion and strength, safely restore desired level of function.    Mechanical diagnosis:  probable dysfunction associated with degenerative changes.  Next visit:  consider lateral testing.        Impairments: pain, postural weakness, decreased range of motion and decreased strength     Prognosis: Guarded     Positive Prognosis Rationale: behavior, medical status/condition and response to trial tx.      Therapy Goals  Goals: Within 8 sessions be able to bend repetitively for lifting household items, stand for work/household activities at least 30 minutes and do house/yard chores without being limited by symptoms.  Will also be independent with self care strategies to prevent and manage symptoms.     Plan  Therapy options: will be seen for skilled physical therapy services  Planned therapy interventions: education - patient, home exercise program, manual therapy and therapeutic exercises  Frequency: 1x week  Duration in weeks: 8  Education provided to: patient.  Education provided: anatomy, HEP, indications/contraindications to exercises, posture and treatment options and plan  Education Results: verbalized good understanding.      Subjective  Onset mechanism:  as a result of no specific incident.  Previous episodes:  Date back to around 1985.  Treatment:  injections.    Diagnostic testing:  None this episode for spine.      Medical follow up:  10/27/21 Riccardo Dubin.    Initial FOTO score:  40/100.    Reported symptomatic behavior (at initial evaluation):   Activity Worse Better No Effect Comments   Bending x      Sitting  x     Standing  x      Walking  x  somewhat   Lying   x    Transitions       As day progresses       Other         Max pain:  9/10 intensity.    Least pain:  0/10 intensity.     Current Functional Status (at initial evaluation):  Limiting positional and activity tolerances.  Sleep has been disturbed.  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    Precautions:  History of kidney stones, urinary frequency, long term use of systemic steroids, chronic airway obstruction, psoriasis, obesity, diabetes, bladder cancer, hearing impairment, visual impairment, peripheral neuropathy, MI, balance problems.  Patient Active Problem List    Diagnosis Date Noted   ??? History of kidney stones 01/21/2021   ??? Benign prostatic hyperplasia with urinary frequency 12/27/2020   ??? Abnormal findings on diagnostic imaging of lung 06/05/2019   ??? Allergic rhinitis 06/05/2019   ??? Arthritis 06/05/2019   ???  Empty sella syndrome (CMS-HCC) 06/18/2018   ??? Long term current use of systemic steroids 06/18/2018   ??? Sensory hearing loss, bilateral 09/14/2017   ??? Coronary artery disease involving native coronary artery of native heart without angina pectoris 12/31/2014   ??? Degeneration of intervertebral disc of cervical region 08/28/2014   ??? Hyperlipemia 01/13/2014   ??? Chronic airway obstruction (CMS-HCC) 04/28/2011   ??? Brachial neuritis 10/20/2010   ??? Hypogonadism 10/10/2010   ??? Psoriasis and similar disorder 08/06/2009   ??? Type 2 diabetes mellitus with diabetic polyneuropathy, with long-term current use of insulin (CMS-HCC) 07/15/2009   ??? Obesity 07/30/2008   ??? Sinusitis, chronic 05/01/2007   ??? Asthma 08/14/2000   ??? Esophageal reflux 08/14/2000 Past Medical History:   Diagnosis Date   ??? Arthritis    ??? Asthma    ??? Cancer (CMS-HCC)    ??? Diabetes mellitus (CMS-HCC)    ??? Hearing impairment     hearing aids   ??? Hyperlipidemia    ??? Kidney stone    ??? Myocardial infarction (CMS-HCC)    ??? Peripheral neuropathy    ??? Visual impairment     cataract sx 05/27/20       Medications:   Reviewed in electronic medical record.       Red Flags:   Major trauma with suspected structural instability:  no.  Rapidly progressing extremity weakness:  no.  Associated systemic problems:  no.    Associated bowel or bladder control problems:  no.    Prior Functional Status:  Desired daily living, recreational, vocational activities without being limited by symptoms.     Patient Goals:  Reduce pain, restore prior functional status.     Objective  Posture/Observation:   Sitting:  Slouched.  Correction of posture no significant effect.      Sit to stand:  slow and requires use of UEs.       Gait Analysis:  asymmetric trunk rotation and unsteady with direction changes (asymm is very mild)    Dural signs:  Seated SLRs each lag 10 degrees, NE sx.    LE Strength Right Left   Hip flexion (L2) 4/5 5/5   Knee extension (L3) 5/5 5/5   Ankle dorsiflexion (L4) 5/5 5/5   Great toe extension (L5) 5/5 5/5   Ankle plantar flexion (S1)  (sitting) 5/5 5/5          Sensation:       Reflexes:           Standing:  right rotation and reduced lordosis, mild and signif    Lumbar Spine ROM:  Motion No ROM Loss  (0%) Min   ROM Loss  (1-25%) Mod ROM Loss  (26-65%) Major   ROM Loss  (66-100%) Symptoms    Flexion   x      Extension    x x   SG Right    x    SG Left   x  x     Additional tests:         Treatment   Current (pre-test) pain standing:  Low back, 3/10 intensity.         Mechanical and exercise testing:   Symptoms during Symptoms after Mechanical response   Rep ext stand at counter I NW    Rep flex sit NE NE    Rep flex stand NE NE    Rep ext lying I NW    Rep flex lying I NW    Rep  posture overcorrect sit.    Posture:    Emphasized good sitting posture with periodic standing breaks.  Sleep tends to be supine or on either side.    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      Visit #: Home exercise trials and progression   1 Rep posture overcorrect sit 4x/day   2    3    4       Time Coded Treatments:  15 minutes - Therapeutic exercise:  Manual and verbal cues during components tested above to improve ROM and strength, progress function and develop HEP.      PT Evaluation:  35 minutes.  Complexity code based on details noted above including precautions, past episodes, testing and treatment this episode, problem list, current functional status, FOTO score, functional / ROM / strength / neurological testing, symptomatic responses to mechanical testing, body regions affected by chief complaint, participation in plan of care development, time spent in consult for evaluation.            - Tested repeated movements utilizing MDT principles to select self care strategies.    - Agreed on home exercise program with verbal, written instruction as well as demo, return demo today in clinic for proper understanding.    - Discussed condition at length and warned to avoid true worsening or peripheralization of symptoms.  - Abbreviations used in this document include the following: P: Produces, A: Abolishes, I: Increases, D: Decreases, B: Better, W: Worse, NB: No Better, NW: No Worse, NE: No Effect, Rep: Repeated, Sust: Sustained, OP: Overpressure, Mobs: Mobilizations.     I attest that I have reviewed the above information.  Signed: Ena Dawley, PT  08/12/2021 8:33 AM

## 2021-08-12 NOTE — Unmapped (Signed)
Patient requesting alternate pharmacy / requested medication pended per protocol

## 2021-08-12 NOTE — Unmapped (Signed)
Other Call    ??? Caller name: Sneijder  ??? Best callback number: [phone]  ??? Describe the reason for the call: Patient called stated a prescription for [facility] was sent to [facility] it is too expensive there; he would like to know if it can be sent to.  ??? [facility]   [address] [place], [place] [address]   Phone:  [phone] ??Fax:  [phone]  ??? PCP: Ericka Pontiff, MD  ??? Last encounter in department: 08/01/2021

## 2021-08-19 DIAGNOSIS — E119 Type 2 diabetes mellitus without complications: Principal | ICD-10-CM

## 2021-08-19 MED ORDER — TRULICITY 0.75 MG/0.5 ML SUBCUTANEOUS PEN INJECTOR
SUBCUTANEOUS | 3 refills | 28 days | Status: CP
Start: 2021-08-19 — End: 2021-09-18

## 2021-08-19 NOTE — Unmapped (Signed)
Please return call.

## 2021-08-22 NOTE — Unmapped (Signed)
[facility] THERAPY SERVICES SPINE CTR [place]  OUTPATIENT PHYSICAL THERAPY  08/22/2021     Patient Name:  William Keller  Date of Birth:  [birth year]  Diagnosis:    Encounter Diagnosis   Name Primary?   ??? Lumbar spondylosis Yes        Referred by:  Coralee North     Date Treatment Started,  Date Care Plan Established:  08/12/21    Chief complaint:  Right greater than left sided low back pain.  Date of onset:  Around 2020.        Assessment & Plan     Assessment details:   Visit #:  2.    Mechanical diagnosis:  probable dysfunction associated with degenerative changes.  Next visit:  reconsider lateral bias as needed.        Impairments: pain, postural weakness, decreased range of motion and decreased strength     Prognosis: Guarded     Positive Prognosis Rationale: behavior, medical status/condition and response to trial tx.      Therapy Goals  Goals: Within 8 sessions be able to bend repetitively for lifting household items, stand for work/household activities at least 30 minutes and do house/yard chores without being limited by symptoms.  Will also be independent with self care strategies to prevent and manage symptoms.     Plan  Therapy options: will be seen for skilled physical therapy services  Planned therapy interventions: education - patient, home exercise program, manual therapy and therapeutic exercises  Frequency: 1x week  Duration in weeks: 8  Education provided to: patient.  Education provided: anatomy, HEP, indications/contraindications to exercises, posture and treatment options and plan  Education Results: verbalized good understanding.      Subjective  Onset mechanism:  as a result of no specific incident.  Previous episodes:  Date back to around 1985.  Treatment:  injections.    Diagnostic testing:  None this episode for spine.      Medical follow up:  10/27/21 Riccardo Dubin.    Initial FOTO score:  40/100.    Reported symptomatic behavior (at initial evaluation):   Activity Worse Better No Effect Comments   Bending x Sitting  x     Standing  x      Walking  x  somewhat   Lying   x    Transitions       As day progresses       Other         Max pain:  9/10 intensity.    Least pain:  0/10 intensity.     Current Functional Status (at initial evaluation):  Limiting positional and activity tolerances.  Sleep has been disturbed.  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    Precautions:  History of kidney stones, urinary frequency, long term use of systemic steroids, chronic airway obstruction, psoriasis, obesity, diabetes, bladder cancer, hearing impairment, visual impairment, peripheral neuropathy, MI, balance problems, ventral hernia.    Neck pain dating back to around 2021, no specific incident as the cause, worse with turning and first thing in the morning.      Today's subjective report:    Lifting aggravates.  Standing aggravates, sitting relieves.  HEP less than planned.      Objective    Lumbar Spine ROM:  Motion No ROM Loss  (0%) Min   ROM Loss  (1-25%) Mod ROM Loss  (26-65%) Major   ROM Loss  (66-100%) Symptoms    Flexion  x      Extension    x    SG Right   x     SG Left   x       Additional tests:         Treatment   Current (pre-test) pain standing:  None.    Mechanical and exercise testing:  Rep posture overcorrect sit.  Rep ext stand at counter.  Low grade ext mobs.  Rot flex lying, more limited knees R but discrepancy is mild.  Rep alt rot flex lying.  Bridge painful and limited, did not rep.  Rep posterior pelvic tilts hook-lying, not able to effectively coordinate.  Prone hip ROM screen, bilat IR w signif loss, bilat ext w mod loss, nothing symptomatic.  Rep RSG stand P-back, NW.    Posture:    Emphasized good sitting posture with periodic standing breaks.  Sleep tends to be supine or on either side.    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      Visit #: Home exercise trials and progression   1 Rep posture overcorrect sit 4x/day 2 Rep posture overcorrect sit,  Rep ext stand at counter 4x/day.   3    4      Time Coded Treatments:  40 minutes - Therapeutic exercise:  Manual and verbal cues during components tested above to improve ROM and strength, progress function and develop HEP.         - Tested repeated movements utilizing MDT principles to select self care strategies.    - Agreed on home exercise program with verbal, written instruction as well as demo, return demo today in clinic for proper understanding.    - Discussed condition at length and warned to avoid true worsening or peripheralization of symptoms.  - Abbreviations used in this document include the following: P: Produces, A: Abolishes, I: Increases, D: Decreases, B: Better, W: Worse, NB: No Better, NW: No Worse, NE: No Effect, Rep: Repeated, Sust: Sustained, OP: Overpressure, Mobs: Mobilizations.     I attest that I have reviewed the above information.  Signed: Ena Dawley, PT  08/22/2021 8:45 AM

## 2021-08-23 MED FILL — PRALUENT PEN 150 MG/ML SUBCUTANEOUS PEN INJECTOR: SUBCUTANEOUS | 28 days supply | Qty: 2 | Fill #3

## 2021-08-24 NOTE — Unmapped (Signed)
On going arthritic pain, wanted to get a referral to rheumatologist

## 2021-08-24 NOTE — Unmapped (Signed)
Other Call    ??? Caller name: Dorna Leitz  ??? Best callback number: [phone]  ??? Describe the reason for the call: Patient says Dr. Coralee North had called him last week. Also patient is requesting a referral for Rheumatology at [facility]   ??? PCP: Ericka Pontiff, MD  ??? Last encounter in department: 08/01/2021

## 2021-08-29 ENCOUNTER — Ambulatory Visit: Admit: 2021-08-29 | Discharge: 2021-08-30 | Payer: MEDICARE

## 2021-08-29 DIAGNOSIS — Z794 Long term (current) use of insulin: Principal | ICD-10-CM

## 2021-08-29 DIAGNOSIS — I2510 Atherosclerotic heart disease of native coronary artery without angina pectoris: Principal | ICD-10-CM

## 2021-08-29 DIAGNOSIS — E1142 Type 2 diabetes mellitus with diabetic polyneuropathy: Principal | ICD-10-CM

## 2021-08-29 DIAGNOSIS — L409 Psoriasis, unspecified: Principal | ICD-10-CM

## 2021-08-29 DIAGNOSIS — J410 Simple chronic bronchitis: Principal | ICD-10-CM

## 2021-08-29 DIAGNOSIS — M5136 Other intervertebral disc degeneration, lumbar region: Principal | ICD-10-CM

## 2021-08-29 MED ORDER — LIDOCAINE 5 % TOPICAL PATCH
MEDICATED_PATCH | Freq: Two times a day (BID) | TRANSDERMAL | 0 refills | 5 days | Status: CP
Start: 2021-08-29 — End: 2022-08-29

## 2021-08-29 NOTE — Unmapped (Addendum)
Seemingly stable.  149/81 today BP.  Pain reported but will follow-up with Dr. Hyacinth Meeker as scheduled.    -- Continue Praululent 150 mg injectable q. 14 days

## 2021-08-29 NOTE — Unmapped (Signed)
Ongoing issues with intermittent sharp pain. Seen by several providers in past with imaging 08/2020 with multi-level degeneration of the lumbar spine and stenosis. Pt with intermittent sharp pain to right lumbar spine and right buttock. No radicular sx down the leg. Also reports some issues with right knee and cervical spine. 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 He is currently on steroids for COPD which we are going to taper off. I explained that sx are more consistent with OA and spinal stenosis and that weight loss and pain control should be priority an follow up with spine center.     --Cnt following with spine center  --Lidocaine patch Q12 hours  --Cnt w PT  --If sx persist or worsen after tapering off steroids, we can consider some blood work for inflammatory markers

## 2021-08-29 NOTE — Unmapped (Signed)
[facility] THERAPY SERVICES SPINE CTR [place]  OUTPATIENT PHYSICAL THERAPY  08/29/2021     Patient Name:  William Keller  Date of Birth:  January 19, 1941  Diagnosis:    Encounter Diagnosis   Name Primary?    Lumbar spondylosis Yes        Referred by:  Coralee North     Date Treatment Started,  Date Care Plan Established:  08/12/21    Chief complaint:  Right greater than left sided low back pain.  Date of onset:  Around 2020.        Assessment & Plan     Assessment details:   Visit #:  3.  Symptomatic degenerative changes could be the logical alternative diagnosis.    Mechanical diagnosis:  probable dysfunction associated with degenerative changes.  Next visit:  reconsider mechanics vs strength points focus.  Sust rot flex lying and rep clamshells could be worthwhile tests.      Impairments: pain, postural weakness, decreased range of motion and decreased strength     Prognosis: Guarded     Positive Prognosis Rationale: behavior, medical status/condition and response to trial tx.      Therapy Goals  Goals: Within 8 sessions be able to bend repetitively for lifting household items, stand for work/household activities at least 30 minutes and do house/yard chores without being limited by symptoms.  Will also be independent with self care strategies to prevent and manage symptoms.     Plan  Therapy options: will be seen for skilled physical therapy services  Planned therapy interventions: education - patient, home exercise program, manual therapy and therapeutic exercises  Frequency: 1x week  Duration in weeks: 8  Education provided to: patient.  Education provided: anatomy, HEP, indications/contraindications to exercises, posture and treatment options and plan  Education Results: verbalized good understanding.      Subjective  Onset mechanism:  as a result of no specific incident.  Previous episodes:  Date back to around 1985.  Treatment:  injections.    Diagnostic testing:  None this episode for spine.      Medical follow up:  10/27/21 Riccardo Dubin.    Initial FOTO score:  40/100.    Reported symptomatic behavior (at initial evaluation):   Activity Worse Better No Effect Comments   Bending x      Sitting  x     Standing  x      Walking  x  somewhat   Lying   x    Transitions       As day progresses       Other         Max pain:  9/10 intensity.    Least pain:  0/10 intensity.     Current Functional Status (at initial evaluation):  Limiting positional and activity tolerances.  Sleep has been disturbed.  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    Precautions:  History of kidney stones, urinary frequency, long term use of systemic steroids, chronic airway obstruction, psoriasis, obesity, diabetes, bladder cancer, hearing impairment, visual impairment, peripheral neuropathy, MI, balance problems, ventral hernia.    Neck pain dating back to around 2021, no specific incident as the cause, worse with turning and first thing in the morning.      Right knee problems dating back to around March 2022.    Today's subjective report:    Not much overall change.  Limiting lifting ability.  HEP more than planned.      Objective  Lumbar Spine ROM:  Motion No ROM Loss  (0%) Min   ROM Loss  (1-25%) Mod ROM Loss  (26-65%) Major   ROM Loss  (66-100%) Symptoms    Flexion   x      Extension    x    SG Right   x     SG Left   x       Additional tests:         Treatment   Current (pre-test) pain standing:  low back, 1/10 intensity.    Mechanical and exercise testing:  Rep ext stand at counter I, NW.  Rep alt rot flex lying.  Rep flex SLRs, painful required manual assist.  Rep alt ext SLRs lying.  Rep flex sit NE.  Rep posture overcorrect sit.    Posture:    Emphasized good sitting posture with periodic standing breaks.  Sleep tends to be supine or on either side.    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      Visit #: Home exercise trials and progression   1 Rep posture overcorrect sit 4x/day   2 Rep posture overcorrect sit,  Rep ext stand at counter 4x/day.   3 Rep ext stand at counter 4x/day.  Rep posture overcorrect sit,  Rep alt rot flex lying 2x/day.   4      Time Coded Treatments:  40 minutes - Therapeutic exercise:  Manual and verbal cues during components tested above to improve ROM and strength, progress function and develop HEP.         - Tested repeated movements utilizing MDT principles to select self care strategies.    - Agreed on home exercise program with verbal, written instruction as well as demo, return demo today in clinic for proper understanding.    - Discussed condition at length and warned to avoid true worsening or peripheralization of symptoms.  - Abbreviations used in this document include the following: P: Produces, A: Abolishes, I: Increases, D: Decreases, B: Better, W: Worse, NB: No Better, NW: No Worse, NE: No Effect, Rep: Repeated, Sust: Sustained, OP: Overpressure, Mobs: Mobilizations.     I attest that I have reviewed the above information.  Signed: Ena Dawley, PT  08/29/2021 8:30 AM

## 2021-08-29 NOTE — Unmapped (Addendum)
Last A1c 7.9. Will be starting Trulicity - just obtained pen this week. Hopefully this will assist with weight loss, plus tapering steroids.    --Cnt Trulicity   --Discussed exercise/mobility for weight loss as we can get his back pain under control

## 2021-08-29 NOTE — Unmapped (Addendum)
Remote hx psoriasis. Seemingly stable but does have some rash to right thigh and bil ears at times.    --No currently on steroid ointments- on systemic therapy that we will be tapering

## 2021-08-29 NOTE — Unmapped (Signed)
[facility]-  Routine Visit    Patient's PCP: Ericka Pontiff, MD  Date of Visit: 08/29/21     Reason for Visit:  Chronic Back Pain     Assessment/Plan:  William William is a 81 y.o. male History provided by the patient.    Chronic airway obstruction (CMS-HCC)  Stable but has been on chronic steroids for long time.  He is also taking inhalers and recently saw pulmonology and is doing well.  He is in agreement with steroid taper today    -- Reduce hydrocortisone tablet to 2.5 mg x 3 weeks then stop  --Continue inhalers    Coronary artery disease involving native coronary artery of native heart without angina pectoris  Seemingly stable.  149/81 today BP.  Pain reported but will follow-up with Dr. Hyacinth Meeker as scheduled.    -- Continue Praululent 150 mg injectable q. 14 days    Psoriasis and similar disorder  Remote hx psoriasis. Seemingly stable but does have some rash to right thigh and bil ears at times.    --No currently on steroid ointments- on systemic therapy that we will be tapering    Type 2 diabetes mellitus with diabetic polyneuropathy, with long-term current use of insulin (CMS-HCC)  Last A1c 7.9. Will be starting Trulicity - just obtained pen this week. Hopefully this will assist with weight loss, plus tapering steroids.    --Cnt Trulicity   --Discussed exercise/mobility for weight loss as we can get his back pain under control         Degenerative disc disease, lumbar  Ongoing issues with intermittent sharp pain. Seen by several providers in past with imaging 08/2020 with multi-level degeneration of the lumbar spine and stenosis. Pt with intermittent sharp pain to right lumbar spine and right buttock. No radicular sx down the leg. Also reports some issues with right knee and cervical spine. 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 He is currently on steroids for COPD which we are going to taper off. I explained that sx are more consistent with OA and spinal stenosis and that weight loss and pain control should be priority an follow up with spine center.     --Cnt following with spine center  --Lidocaine patch Q12 hours  --Cnt w PT  --If sx persist or worsen after tapering off steroids, we can consider some blood work for inflammatory markers      Follow up: DM -A1c at next visit, weight check, breathing with steroid taper?, back pain sx improved?  Future Appointments   Date Time Provider Department Center   08/29/2021  9:15 AM [address] [name]   08/29/2021  3:15 PM [name], PT [facility]   09/08/2021 10:00 AM [name], MD [facility]   09/15/2021  9:00 AM [name], MD [facility]   10/27/2021  1:15 PM [name], MD [facility]   11/15/2021 10:45 AM [name], MD [facility]   11/15/2021 11:00 AM [name], MD [facility]   11/15/2021 11:45 AM [name], RD/LDN [facility]   12/23/2021 11:45 AM [name], MD [facility]       Plan of care and instructions were reviewed with patient. Medications were reviewed and reconciled. Red flags and monitoring parameters for the problems addressed were discussed.     I personally spent 70 minutes face-to-face and non-face-to-face in the care  of this patient, which includes all pre, intra, and post visit time on the date of service.      Hamilton Capri, DNP, RN, AGPCNP-C, CWOCN    ------------------------------------------------------------------------------------------------------------  Subjective     HPI Problem Based:    Pt is 81 yo male with hx of chronic back pain with sciatica, diabetes, obesity and COPD who has been on chronic oral steroids for years.  Presents today for ongoing issues of back pain and he believes that he may have psoriatic arthritis.    He is continuing to have sharp pain in the right lower back.  He is followed by the spine center. Next appointment is in June. Previously has requested a rheum referral but sx thought to be more consistent with DDD. Working with PT and does not feel like PT helped.    Pain is in lower back and pain can move to right lower buttock/hip area.  Pain is reported as sharp and quick.  No radicular symptoms down the leg reported.  Patient does have history of neuropathy.  He also has pain in the right knee at times and the right neck. When the pain hits his knee- its so bad he cannot walk, though this does not happen that often. Most issues are noted on the right side.     Has taken everything over the counter he an get. Taking supplements for joint pain like glucosamine.  Lidocaine patches to help with some of the pain.    Does have a hx self reported psoriasis. Not on any topicals. No visible rash to ears (reports itching an hx rash here). Subjectively reports a rash to right lateral thigh.     Just got his Trulicity pen this week. He understands that he needs to lose weight but reports he cannot move as well.    Had kidney stones in the past but most recent CT scan with resolution of stones in right kidney.  Still with stones noted in the left kidney though denies pain on the side.    See assessment and plan above for problem-based HPI.    10 system ROS negative other than what is in HPI above.    Past Medical-Surgical History:  Past Medical History:   Diagnosis Date    Arthritis     Asthma     Cancer (CMS-HCC)     Diabetes mellitus (CMS-HCC)     Hearing impairment     hearing aids    Hyperlipidemia     Kidney stone     Myocardial infarction (CMS-HCC)     Peripheral neuropathy     Visual impairment     cataract sx 05/27/20     Past Surgical History:   Procedure Laterality Date    CATARACT EXTRACTION      CATARACT EXTRACTION, BILATERAL      CHG ULTRASONIC GUIDANCE, INTRAOPERATIVE Right 02/21/2021    Procedure: ULTRASONIC GUIDANCE, INTRAOPERATIVE;  Surgeon: Vickii Penna, MD;  [facility]: [facility];  Service: Urology    esophaus rap surgery      PR CATH PLACE/CORON ANGIO, IMG SUPER/INTERP,W LEFT HEART VENTRICULOGRAPHY N/A 03/31/2015    Procedure: Left Heart Catheterization;  Surgeon: Joanie Coddington, MD;  Location: [facility] CATH;  Service: Cardiology    PR COLONOSCOPY FLX DX W/COLLJ SPEC WHEN PFRMD N/A 08/29/2019    Procedure: COLONOSCOPY, FLEXIBLE, PROXIMAL TO SPLENIC FLEXURE; DIAGNOSTIC, W/WO COLLECTION SPECIMEN BY BRUSH OR WASH;  Surgeon: Leland Her, MD;  Location: [facility] PROCEDURES  [facility];  Service: Gastroenterology    PR PERCUT DRAIN/INJECT RENAL CYST Right 02/21/2021    Procedure: ASPIRATION AND/OR INJECTION OF RENAL CYST OR PELVIS BY NEEDLE, PERCUTANEOUS;  Surgeon: Vickii Penna, MD;  Location: [facility] OR [facility];  Service: Urology    PR PERQ NL/PL LITHOTRP COMPLEX >2 CM MLT LOCATIONS Right 02/21/2021    Procedure: supine standard PCNL; need fortec;  Surgeon: Vickii Penna, MD;  Location: [facility] OR [facility];  Service: Urology    PR PVB THORACIC SINGLE INJECTION SITE W/IMG GID Right 02/21/2021    Procedure: PARAVERTEBRAL BLOCK (PVB) (PARASPINOUS BLOCK), THORACIC; SINGLE INJECTION SITE (INCLUDES IMAGING GUIDANCE, WHEN PERFORMED);  Surgeon: Vickii Penna, MD;  Location: [facility] OR [facility];  Service: Urology    PR UPPER GI ENDOSCOPY,BIOPSY N/A 08/29/2019    Procedure: UGI ENDOSCOPY; WITH BIOPSY, SINGLE OR MULTIPLE;  Surgeon: Leland Her, MD;  Location: [facility] PROCEDURES [facility];  Service: Gastroenterology    removal for fatty tumors      SINUS SURGERY      SINUS SURGERY      SKIN BIOPSY      urolift           Social Hx:  Social History     Socioeconomic History    Marital status: Married   Tobacco Use    Smoking status: Former     Packs/day: 1.00     Years: 20.00     Pack years: 20.00     Types: Cigarettes    Smokeless tobacco: Former     Quit date: 1992    Tobacco comments:     30 years ago   Haematologist Use: Never used   Substance and Sexual Activity    Alcohol use: Not Currently     Comment: rarely     Drug use: No    Sexual activity: Not Currently     Partners: Female   Other Topics Concern    Do you use sunscreen? No    Tanning bed use? Yes    Are you easily burned? No    Excessive sun exposure? Yes    Blistering sunburns? Yes    Exercise Yes     Comment: work day to day    Living Situation No       Family Hx:  family history includes Cancer in his mother; Emphysema in his father.    I have reviewed and (if needed) updated the patient's problem list, medications, allergies, past medical and surgical history, social and family history in the EMR.      Objective     BP 149/81 (BP Site: L Arm, BP Position: Sitting, BP Cuff Size: Large)  - Pulse 64  - Temp 36.1 ??C (97 ??F) (Temporal)  - Resp 18  - Wt (!) 101.6 kg (224 lb)  - BMI 33.08 kg/m??    Wt Readings from Last 3 Encounters:   08/29/21 (!) 101.6 kg (224 lb)   08/08/21 (!) 101.7 kg (224 lb 3.2 oz)   08/01/21 (!) 102.5 kg (226 lb)       Physical Exam:  Constitutional: Well-appearing, alert and interactive, appears in no distress.  HEENT: Face symmetric at rest. Normal facial movement bilaterally, including forehead, eye closure and grimace/smile. Hearing intact to conversation. Sclerae and conjunctiva clear.   Resp: Normal WOB  CV: Normal rate  MUSK: Full ROM Joint survey showed mild osteoarthritic changes of the hands and knees. No warmth or  swelling of any joints noted .+ TTP in lumbar spine and right gluteus maximus   Strength Eval:  LE Right:L: hip flexion 5/5 quadriceps 5/5, hamstrings 5/5, dorsiflexion 5/5 and plantar flexion 5/5-- Some pain reports to right lumbar spine with right hip adduction and abduction  LE Left: L: hip flexion 5/5 quadriceps 5/5, hamstrings 5/5, dorsiflexion 5/5 and plantar flexion 5/5  NEURO: AxO x 3. Cranial nerves II-XII intact. +straight leg test in right. Light touch sensation equal bilaterally and vibratory sensation equal bilaterally. Strength 5/5 symmetric to BUE and BLE. Reflexes 2+ symmetric patellar. Normal Gait, balanced with no ataxia.   Skin: Warm, dry    Prior labs and diagnostic studies and imaging studies reviewed.     Imaging Results - MR LUMBAR SPINE WO CONTRAST (09/30/2020 2:38 PM EDT)  Procedure Note   Elie Goody, MD - 10/01/2020   Formatting of this note might be different from the original.  CLINICAL DATA: Chronic low back pain.    EXAM:  MRI LUMBAR SPINE WITHOUT CONTRAST    TECHNIQUE:  Multiplanar, multisequence MR imaging of the lumbar spine was  performed. No intravenous contrast was administered.    COMPARISON: None.    FINDINGS:  Segmentation: Normal    Alignment: Slight retrolisthesis L2-3. Remaining alignment normal.    Vertebrae: Negative for fracture or mass. Hemangioma L5 vertebral  body.    Conus medullaris and cauda equina: Conus extends to the L1 level.  Conus and cauda equina appear normal.    Paraspinal and other soft tissues: Negative for paraspinous mass or  adenopathy. Multiple bilateral renal cysts.    Disc levels:    T12-L1: Negative    L2-3: Mild disc degeneration and Schmorl's node. Negative for  stenosis    L2-3: Disc degeneration asymmetric to the left. Left-sided Schmorl's  node with associated fatty and edema endplate changes on the left.  Moderate facet hypertrophy bilaterally. Mild to moderate spinal  stenosis. Severe subarticular stenosis on the left with expected  impingement left L3 nerve root. Mild left foraminal stenosis. Mild  right subarticular stenosis.    L3-4: Disc degeneration with disc bulging and bilateral facet  hypertrophy. Mild spinal stenosis. Mild to moderate subarticular  stenosis bilaterally    L4-5: Disc degeneration with Schmorl's node. Fatty endplate changes.  Bilateral facet hypertrophy. Moderate spinal stenosis. Moderate  subarticular and foraminal stenosis bilaterally due to spurring,  right greater than left.    L5-S1: Disc degeneration with disc space narrowing and diffuse  endplate spurring. Moderate subarticular stenosis bilaterally due to  spurring.    IMPRESSION:  Multilevel degenerative change in the lumbar spine. Negative for  fracture    Mild to moderate spinal stenosis L2-3. Severe subarticular stenosis  on the left with left L3 nerve root impingement    Mild spinal stenosis L3-4 with mild to moderate subarticular  stenosis bilaterally    Moderate subarticular and foraminal stenosis bilaterally L4-5    Moderate subarticular stenosis bilaterally L5-S1      Electronically Signed  By: Marlan Palau M.D.  On: 10/01/2020 12:46       **Dictated using voice-activated software. Despite editing, typographical errors may exist.

## 2021-08-29 NOTE — Unmapped (Addendum)
It was a pleasure to work with you today.     --Please follow up with the spine center    --We are going to do a steroid taper. Please half your hydrocortisone to 2.5mg  x 3 weeks, then STOP. Continue your inhalers for your COPD    --Please start your Trulicity for your diabetes and this will help with weight loss    --Please continue lidocaine patches, consider massage, continue heat pad    --[facility] in [place] may be good for a deep tissue massage. [phone]     Please return to clinic to see your primary care provider as scheduled.    Please see me in 1.5 month    Kindly,    Hamilton Capri, DNP, RN, AGPCNP-C, [facility]  Phone: [phone]  Fax:      [phone]  ______________________________________________________________________    If you have any questions, please call the [facility] at [phone] during regular business hours, or send me a message through MyChart.     Please allow 3 business days for responses to [employer]. Test results will be released immediately in MyChart. Please allow 3 business days for [place] to review and interpret them.    For urgent concerns after hours and weekends, please call the [facility] nurse. Health link (for after hours and weekends): [phone]

## 2021-08-29 NOTE — Unmapped (Signed)
Stable but has been on chronic steroids for long time.  He is also taking inhalers and recently saw pulmonology and is doing well.  He is in agreement with steroid taper today    -- Reduce hydrocortisone tablet to 2.5 mg x 3 weeks then stop  --Continue inhalers

## 2021-09-06 NOTE — Unmapped (Signed)
Received a voicemail from [facility] regarding order nebulizer supply order sent to them.  Pharmacy states they are unable to provide patient with nebulizer supplies.    Faxed office notes, facesheet and order for nebulizer supplies to [employer] at [phone].  Fax confirmation received.    Called patient, no answer.  Left voicemail for patient to inform them to contact [employer] to check on order for nebulizer.  Advised patient to call [phone].

## 2021-09-06 NOTE — Unmapped (Signed)
2:59 PM  Patient's daughter left voicemail stating patient is needing nebulizer order sent to another company as [employer] is unable to supply a machine.    Called [facility] [place] back.  Informed Victorino Dike that nebulizer order has been faxed to [employer] and they will contact patient about nebulizer and supplies.  Victorino Dike verbalized understanding.

## 2021-09-08 NOTE — Unmapped (Signed)
Abstraction Result Flowsheet Data    This patient's last AWV date: [facility] Last Medicare Wellness Visit Date: 05/26/2020  This patients last WCC/CPE date: : 06/25/2019      Reason for Encounter  Reason for Encounter: Outreach  Primary Reason for Outreach: AWV  Text Message: No  MyChart Message: No  Outreach Call Outcome: Per Patient appt already completed (cll pt he stated that insurance company came to his house this year, mention care gap)

## 2021-09-12 ENCOUNTER — Ambulatory Visit: Admit: 2021-09-12 | Discharge: 2021-09-12 | Payer: MEDICARE

## 2021-09-12 DIAGNOSIS — M159 Polyosteoarthritis, unspecified: Principal | ICD-10-CM

## 2021-09-12 DIAGNOSIS — M5136 Other intervertebral disc degeneration, lumbar region: Principal | ICD-10-CM

## 2021-09-12 DIAGNOSIS — I259 Chronic ischemic heart disease, unspecified: Principal | ICD-10-CM

## 2021-09-12 DIAGNOSIS — E785 Hyperlipidemia, unspecified: Principal | ICD-10-CM

## 2021-09-12 DIAGNOSIS — I240 Acute coronary thrombosis not resulting in myocardial infarction: Principal | ICD-10-CM

## 2021-09-12 LAB — LIPID PANEL
CHOLESTEROL/HDL RATIO SCREEN: 2.8 (ref 1.0–4.5)
CHOLESTEROL: 124 mg/dL (ref <=200)
HDL CHOLESTEROL: 44 mg/dL (ref 40–60)
LDL CHOLESTEROL CALCULATED: 35 mg/dL — ABNORMAL LOW (ref 40–99)
NON-HDL CHOLESTEROL: 80 mg/dL (ref 70–130)
TRIGLYCERIDES: 226 mg/dL — ABNORMAL HIGH (ref 0–150)
VLDL CHOLESTEROL CAL: 45.2 mg/dL — ABNORMAL HIGH (ref 12–42)

## 2021-09-12 NOTE — Unmapped (Signed)
Will check your cholesterol today

## 2021-09-12 NOTE — Unmapped (Signed)
William Lynnae January, MD    REASON FOR VISIT  Patient is referred for follow up on cardiovascular issues, CAD, HTN, HLD    Assessment/Plan:   Coronary artery disease   William [place] has a history of coronary artery disease dating back to 2016.  He was admitted for an ST elevation myocardial infarction.  He has cardiac risk factors which include diabetes mellitus, hypertension and hyperlipidemia. He is doing well with respect to his cardiovascular disease.  He has had no recurrent angina and has not used nitroglycerin. He had not previously been able to tolerate statin therapy.  He had discontinued Zetia and started Repatha in [place].    - ContinueRepatha every 14 days  - Lipid panel today, LDL 35 and cholesterol 124.  - Continue aspirin 81 mg daily  Lab Results   Component Value Date    LDL 35 (L) 09/12/2021       Hypertension  William [place] blood pressure is controlled, home BP 117/70s, today 132/75 in the clinic.      Hyperlipidemia  He is on secondary therapy with Repatha  -LDL at goal today, see above.    Return in about 6 months (around 03/14/2022).    Future Appointments   Date Time Provider Department Center   09/15/2021  9:00 AM [name], MD [facility]   09/20/2021  9:00 AM [name], PT [facility]   10/10/2021  9:45 AM Jeralene Huff [name] [place] [facility]   10/27/2021  1:15 PM [name], MD [facility]   11/15/2021 10:45 AM [name], MD [facility]   11/15/2021 11:00 AM [name], MD [facility] [place]   11/15/2021 11:45 AM [name], RD/LDN [facility]   12/23/2021 11:45 AM [name], MD [facility]   03/16/2022  2:00 PM [name], MD [facility]        I personally spent 30 minutes face-to-face and non-face-to-face in the care of this patient, which includes all pre, intra, and post visit time on the date of service.  All documented time was specific to the E/M visit and does not include any procedures that may have been performed.       HISTORY OF PRESENT ILLNESS  William [place] is a 81 y.o. gentleman initially referred for evaluation prior to kidney stone surgery.  He is in today to follow-up on his cardiovascular history.  Since last visit with Dr. Hyacinth Meeker he has done well and has begun Repatha for secondary prevention.  He is tolerating without difficulty.  His direct LDL today is at goal.  He has had no angina and has used no nitroglycerin.    He has a cardiac history which dates back to 2016.  He presented with an ST elevation MI in April 2016.  He was recatheterized in late 2016 with no intervention.        CARDIAC REVIEW OF SYSTEMS:    CHEST PAIN/PRESSURE: no  DYSPNEA AT REST: no  DYSPNEA WITH EXERTION: yes stable COPD  ORTHOPNEA: no  PND:  no  PALPITATIONS:  no  DIZZINESS: no  SYNCOPE/NEAR SYNCOPE:  no  COUGH:  no  WEIGHT CHANGE: no  APPETITE NORMAL:  yes  BOWEL/BLADDER HABITS NORMAL:  yes  ACTIVITY NORMAL:  yes active around his home  EXERCISE TOLERANCE NORMAL:  no regular exercise    COMPLIANT WITH MEDICATIONS:  yes      Past  Medical History:   Diagnosis Date   ??? Arthritis    ??? Asthma    ??? Cancer (CMS-HCC)    ??? Diabetes mellitus (CMS-HCC)    ??? Hearing impairment     hearing aids   ??? Hyperlipidemia    ??? Kidney stone    ??? Myocardial infarction (CMS-HCC)    ??? Peripheral neuropathy    ??? Visual impairment     cataract sx 05/27/20     Past Surgical History:   Procedure Laterality Date   ??? CATARACT EXTRACTION     ??? CATARACT EXTRACTION, BILATERAL     ??? CHG ULTRASONIC GUIDANCE, INTRAOPERATIVE Right 02/21/2021    Procedure: ULTRASONIC GUIDANCE, INTRAOPERATIVE;  Surgeon: Vickii Penna, MD;  [facility];  Service: Urology   ??? esophaus rap surgery     ??? PR CATH PLACE/CORON ANGIO, IMG SUPER/INTERP,W LEFT HEART VENTRICULOGRAPHY N/A 03/31/2015    Procedure: Left Heart Catheterization;  Surgeon: Joanie Coddington, MD; Location: [facility] CATH;  Service: Cardiology   ??? PR COLONOSCOPY FLX DX W/COLLJ SPEC WHEN PFRMD N/A 08/29/2019    Procedure: COLONOSCOPY, FLEXIBLE, PROXIMAL TO SPLENIC FLEXURE; DIAGNOSTIC, W/WO COLLECTION SPECIMEN BY BRUSH OR WASH;  Surgeon: Leland Her, MD;  Location: [facility];  Service: Gastroenterology   ??? PR PERCUT DRAIN/INJECT RENAL CYST Right 02/21/2021    Procedure: ASPIRATION AND/OR INJECTION OF RENAL CYST OR PELVIS BY NEEDLE, PERCUTANEOUS;  Surgeon: Vickii Penna, MD;  Location: [facility] OR [facility];  Service: Urology   ??? PR PERQ NL/PL LITHOTRP COMPLEX >2 CM MLT LOCATIONS Right 02/21/2021    Procedure: supine standard PCNL; need fortec;  Surgeon: Vickii Penna, MD;  Location: [facility] OR [facility];  Service: Urology   ??? PR PVB THORACIC SINGLE INJECTION SITE W/IMG GID Right 02/21/2021    Procedure: PARAVERTEBRAL BLOCK (PVB) (PARASPINOUS BLOCK), THORACIC; SINGLE INJECTION SITE (INCLUDES IMAGING GUIDANCE, WHEN PERFORMED);  Surgeon: Vickii Penna, MD;  Location: [facility] OR [facility];  Service: Urology   ??? PR UPPER GI ENDOSCOPY,BIOPSY N/A 08/29/2019    Procedure: UGI ENDOSCOPY; WITH BIOPSY, SINGLE OR MULTIPLE;  Surgeon: Leland Her, MD;  Location: [facility];  Service: Gastroenterology   ??? removal for fatty tumors     ??? SINUS SURGERY     ??? SINUS SURGERY     ??? SKIN BIOPSY     ??? urolift       Allergies   Allergen Reactions   ??? Glipizide Shortness Of Breath   ??? Actos [Pioglitazone]      Pt states it gave him a heart attack   ??? Crestor [Rosuvastatin] Other (See Comments)     indigestion   ??? Esomeprazole Magnesium Other (See Comments)     Makes patients heart burn worse   ??? Esomeprazole Magnesium Other (See Comments)     Makes patients heart burn worse  Other reaction(s): Other (See Comments)  Makes patients heart burn worse  Makes patients heart burn worse  Makes patients heart burn worse     ??? Liraglutide Other (See Comments)     bloating   ??? Metformin      Hospitalized for shortness of breath/asthma.     ??? Midazolam Other (See Comments)     Doesn't do well with after effects    ??? Sitagliptin      Other reaction(s): JOINT PAIN   ??? Amoxicillin Nausea Only     Other reaction(s): NAUSEA   ???  Atorvastatin      indigestion   ??? Erythromycin Base Nausea Only     Other reaction(s): OTHER   ??? Glimepiride Rash     Other reaction(s): RASH   ??? Oxycodone Nausea Only     Pt states he was severely and acutely nauseated and bloated       Current Outpatient Medications   Medication Sig Dispense Refill   ??? acetaminophen (TYLENOL EXTRA STRENGTH) 500 MG tablet Take 1 tablet (500 mg total) by mouth two (2) times a day. May try with 1 aleve for acute pain. 100 tablet 2   ??? albuterol 2.5 mg /3 mL (0.083 %) nebulizer solution Inhale 3 mL (2.5 mg total) by nebulization every six (6) hours as needed for wheezing. 3 mL 1   ??? albuterol HFA 90 mcg/actuation inhaler Inhale 2 puffs every six (6) hours as needed for wheezing. 8 g 3   ??? alirocumab (PRALUENT) 150 mg/mL subcutaneous injection Inject the contents of one pen (150 mg total) under the skin every fourteen (14) days. 6 mL 3   ??? ASPIRIN LOW DOSE ORAL Take by mouth.     ??? citric acid-potassium citrate (POLYCITRA K) 1,100-334 mg/5 mL solution Take 15 mEq by mouth Three (3) times a day with a meal. (Patient not taking: Reported on 08/29/2021)     ??? clobetasoL (TEMOVATE) 0.05 % cream Apply topically Two (2) times a day. 30 g 0   ??? COVID-19 antigen test (COVID-19 AT-HOME TEST) Kit 2 kits by Miscellaneous route daily as needed. 2 kit 1   ??? dulaglutide (TRULICITY) 0.75 mg/0.5 mL injection pen Inject 0.5 mL (0.75 mg total) under the skin once a week. 2 mL 3   ??? empty container (SHARPS-A-GATOR DISPOSAL SYSTEM) Misc Use as directed for sharps disposal 1 each 2   ??? fluocinonide (LIDEX) 0.05 % external solution Apply topically Two (2) times a day. 60 mL 1   ??? fluticasone propion-salmeteroL (ADVAIR HFA) 230-21 mcg/actuation inhaler Inhale 2 puffs Two (2) times a day. 12 g 11   ??? fluticasone propionate (FLONASE) 50 mcg/actuation nasal spray 2 sprays into each nostril daily. 16 g 11   ??? hydrocortisone (CORTEF) 5 MG tablet Take 1 tablet (5 mg total) by mouth in the morning. With breakfast and lunch.     ??? insulin NPH-insulin regular, 70/30, (HUMULIN/NOVOLIN) 100 unit/mL (70-30) injection Inject 0.16 mL (16 Units total) under the skin Two (2) times a day. 20 mL 3   ??? insulin syr/ndl U100 half mark 0.5 mL 31 gauge x 15/64 (6 mm) Syrg by Miscellaneous route.      ??? ketoconazole (NIZORAL) 2 % shampoo WASH SCALP 3 TIMES A WEEK. LEAVE IT IN PLACE FOR 30 MINUTES BEFORE RINSING     ??? lidocaine (LIDODERM) 5 % patch Place 1 patch on the skin every twelve (12) hours. Apply to affected area for 12 hours only each day (then remove patch) 10 patch 0   ??? tamsulosin (FLOMAX) 0.4 mg capsule Take 1 capsule (0.4 mg total) by mouth nightly. 30 capsule 1   ??? testosterone 1.62 % (20.25 mg/1.25 gram) GlPk USE ONE PACKET - 20.25 MG DAILY. APPLY TO SHOULDER 60 g 1     No current facility-administered medications for this visit.         ROS: As noted above and otherwise negative on 10 point review of systems    Social History     Socioeconomic History   ??? Marital status: Married   Tobacco  Use   ??? Smoking status: Former Smoker     Packs/day: 1.00     Years: 20.00     Pack years: 20.00     Types: Cigarettes   ??? Smokeless tobacco: Former Neurosurgeon     Quit date: 1992   ??? Tobacco comment: 30 years ago   Vaping Use   ??? Vaping Use: Never used   Substance and Sexual Activity   ??? Alcohol use: Not Currently     Comment: rarely    ??? Drug use: No   ??? Sexual activity: Not Currently     Partners: Female     Comment: work day to day     Social Determinants of Psychologist, prison and probation services Strain: Low Risk    ??? Difficulty of Paying Living Expenses: Not hard at all   Food Insecurity: No Food Insecurity   ??? Worried About Programme researcher, broadcasting/film/video in the Last Year: Never true   ??? Ran Out of Food in the Last Year: Never true   Transportation Needs: No Transportation Needs   ??? Lack of Transportation (Medical): No   ??? Lack of Transportation (Non-Medical): No      Family History   Problem Relation Age of Onset   ??? Cancer Mother         throat cancer   ??? Emphysema Father    ??? Melanoma Neg Hx    ??? Basal cell carcinoma Neg Hx    ??? Squamous cell carcinoma Neg Hx         There were no vitals filed for this visit.      Wt Readings from Last 3 Encounters:   09/12/21 (!) 101.7 kg (224 lb 4.8 oz)   08/29/21 (!) 101.6 kg (224 lb)   08/08/21 (!) 101.7 kg (224 lb 3.2 oz)     BP Readings from Last 3 Encounters:   09/12/21 132/75   08/29/21 149/81   08/08/21 128/76       General Appearance:    Alert, cooperative, no distress, appears stated age   HEENT:   No carotid bruits bilaterally or JVD   Respiratory:     Clear to auscultation bilaterally, respirations unlabored   Chest wall:    No tenderness or deformity   Cardiovascular:    Regular rate and rhythm, S1 and S2 normal, no rub or   gallop; No obvious murmur. Arterial pulses present and equal.     Results for orders placed or performed in visit on 08/01/21   POCT Glycosylated Hemoglobin (HGB A1c)   Result Value Ref Range    HGB A1C, POC 7.5 (H) <7.0 %    EST AVERAGE GLUCOSE, POC 169 mg/dL

## 2021-09-12 NOTE — Unmapped (Signed)
Other Call    Caller name: De Nurse callback number: [phone]  Describe the reason for the call: Patient's daughter called; she stated that she would like to know if you will please enter a Referral for her Dad to be seen in the Rheumatology Clinic [facility], requesting Dr. Carlus Pavlov, patient having severe back pain; please call. Thanks Rose  PCP: Ericka Pontiff, MD  Last encounter in department: 08/29/2021

## 2021-09-12 NOTE — Unmapped (Signed)
09/12/21  10:45 AM  Patient's daughter left voicemail asking about nebulizer and supplies.  States patient has not received nebulizer or supplies and wants to know what the status is.    [employer] and spoke with Rob.  Rob states that nebulizer and supplies are being shipped out to patient today.    Called [place] back.  Informed Victorino Dike that [facility] will be shipping nebulizer and supplies to patient today.  Victorino Dike verbalized understanding.

## 2021-09-15 ENCOUNTER — Ambulatory Visit: Admit: 2021-09-15 | Discharge: 2021-09-16 | Payer: MEDICARE | Attending: "Endocrinology | Primary: "Endocrinology

## 2021-09-15 DIAGNOSIS — Z794 Long term (current) use of insulin: Principal | ICD-10-CM

## 2021-09-15 DIAGNOSIS — N500 Atrophy of testis: Principal | ICD-10-CM

## 2021-09-15 DIAGNOSIS — E1142 Type 2 diabetes mellitus with diabetic polyneuropathy: Principal | ICD-10-CM

## 2021-09-15 DIAGNOSIS — Z8551 Personal history of malignant neoplasm of bladder: Principal | ICD-10-CM

## 2021-09-15 LAB — BASIC METABOLIC PANEL
ANION GAP: 7 mmol/L (ref 5–14)
BLOOD UREA NITROGEN: 25 mg/dL — ABNORMAL HIGH (ref 9–23)
BUN / CREAT RATIO: 27
CALCIUM: 9.3 mg/dL (ref 8.7–10.4)
CHLORIDE: 107 mmol/L (ref 98–107)
CO2: 28.5 mmol/L (ref 20.0–31.0)
CREATININE: 0.94 mg/dL
EGFR CKD-EPI (2021) MALE: 83 mL/min/{1.73_m2} (ref >=60)
GLUCOSE RANDOM: 139 mg/dL (ref 70–179)
POTASSIUM: 4.6 mmol/L (ref 3.4–4.8)
SODIUM: 142 mmol/L (ref 135–145)

## 2021-09-15 NOTE — Unmapped (Signed)
Addended by: Jeri Lager B on: 09/15/2021 11:10 AM     Modules accepted: Orders

## 2021-09-15 NOTE — Unmapped (Signed)
Diabetes is well controlled with current medication regimen and lifestyle.    Lab Results   Component Value Date    A1C 7.5 (H) 08/01/2021    A1C 7.9 (H) 05/02/2021    A1C 8.2 (H) 12/27/2020      Wt Readings from Last 3 Encounters:   09/15/21 100.2 kg (221 lb)   09/12/21 (!) 101.7 kg (224 lb 4.8 oz)   08/29/21 (!) 101.6 kg (224 lb)       Last diabetic foot exam: 08/01/21  Last diabetic retinal exam: not UTD.     Plan:  - Continue Trulicity. If well tolerated in couple months, may double dose  - Currently on Humulin 17-18 units QAM and QHS. Advised to continue at 18 units. If hypoglycemia <100 will lower dose.    - Encouraged patient to check blood sugars regularly and to record these numbers as well as date/time taken  - Encouraged healthy diet and activity

## 2021-09-15 NOTE — Unmapped (Addendum)
Currently on testosterone 1.62% one packet 20-25 mg daily applied to shoulder. Last dose was last night. Will check testosterone levels today. Advised patient to follow up with urology regarding new hematuria in light of bladder cancer history and also in regards for PSA monitoring with BPH history before continuing testosterone supplementation.     Lab Results   Component Value Date    TESTOSTERONE 652 02/18/2021

## 2021-09-19 NOTE — Unmapped (Addendum)
[facility] Refill Coordination Note    Specialty Medication(s) to be Shipped:   General Specialty: Praluent    Other medication(s) to be shipped: No additional medications requested for fill at this time     Kyzer Blowe, DOB: Apr 12, 1941  Phone: [phone] (home)       All above HIPAA information was verified with patient's family member, Wife.     Was a Nurse, learning disability used for this call? No    Completed refill call assessment today to schedule patient's medication shipment from the [facility] [phone]).  All relevant notes have been reviewed.     Specialty medication(s) and dose(s) confirmed: Regimen is correct and unchanged.   Changes to medications: Lucion reports no changes at this time.  Changes to insurance: No  New side effects reported not previously addressed with a pharmacist or physician: None reported  Questions for the pharmacist: No    Confirmed patient received a Conservation officer, historic buildings and a Surveyor, mining with first shipment. The patient will receive a drug information handout for each medication shipped and additional FDA Medication Guides as required.       DISEASE/MEDICATION-SPECIFIC INFORMATION        For patients on injectable medications: Patient currently has 0 doses left.  Next injection is scheduled for 10/03/21.    SPECIALTY MEDICATION ADHERENCE     Medication Adherence    Patient reported X missed doses in the last month: 0  Specialty Medication: PRALUENT 150mg /ml  Patient is on additional specialty medications: No  Patient is on more than two specialty medications: No              Were doses missed due to medication being on hold? No    Praluent 150 mg/ml: 0 days of medicine on hand       REFERRAL TO PHARMACIST     Referral to the pharmacist: Not needed      [facility]     Shipping address confirmed in Epic.     Delivery Scheduled: Yes, Expected medication delivery date: 09/22/21.     Medication will be delivered via Same Day Courier to the prescription address in Epic WAM.    Nancy Nordmann [facility] Specialty Technician

## 2021-09-20 LAB — TESTOSTERONE, FREE, TOTAL
TESTOSTERONE (MAYO): 181 ng/dL — ABNORMAL LOW
TESTOSTERONE FREE: 4.83 ng/dL

## 2021-09-22 DIAGNOSIS — E785 Hyperlipidemia, unspecified: Principal | ICD-10-CM

## 2021-09-22 MED FILL — PRALUENT PEN 150 MG/ML SUBCUTANEOUS PEN INJECTOR: SUBCUTANEOUS | 28 days supply | Qty: 2 | Fill #4

## 2021-10-06 NOTE — Unmapped (Unsigned)
[facility]-  Routine Visit    Patient's PCP: Ericka Pontiff, MD  Date of Visit: 10/06/21     Reason for Visit:  Chronic Back Pain ?? Joint pain    Assessment/Plan:  William Keller is a 81 y.o. male History provided by the patient.    No problem-specific Assessment & Plan notes found for this encounter.  Psoriasis  Folllowing with [facility] rheum who does not believe he has psoriatric arthritis. However, course of methotrexate intiated after discussions with patient about risks/benefits per note review (see [facility])    -- Continue with topical cream  -- Start Methotrexate 15 mg weekly and Folic Acid 1 mg daily  ??  Cervical and Lumbar Degenerative Disc Disease  Hx chronic pain. Notally had abnormal MRI C spine and L spine xrayWas started on gabapentin with no benefit. Switched to Lyrica.     -- Followed by Physiatry  -- Recommend Daily stretching   -- Did not tolerate gabapentin  -- Start Lyrica 25 mg twice daily  ??  Osteoarthritis of multiple joints    -- Continue Tylenol for pain  -- Can take Naprosyn as needed for pains     4. Long term use of immunosuppressive therapy  -- Methotrexate is discussed at length including risks, benefits, adverse effects, direction on use and need for monitoring on a routine basis with office visits and labs. Literature regarding methotrexate was provided to the patient. Patient directed to call office if any adverse effect develops or if more questions arise. In addition, issues addressed included mouth sores, skin rashes, gastrointestinal side effects, fever, pulmonary side effects, hair loss, and abnormal lab findings. Discussed the use of folic acid with methotrexate.  -- Check Labs       Follow up: DM -A1c at next visit, weight check, breathing with steroid taper?, back pain sx improved?  Future Appointments   Date Time Provider Department Center   10/10/2021  9:45 AM [address] [name] [place] [facility]   10/27/2021  1:15 PM [name], MD [facility]   11/15/2021 10:45 AM [name], MD [facility]   11/15/2021 11:00 AM [name], MD [facility]   11/15/2021 11:45 AM [name], RD/LDN [facility]   11/16/2021 10:00 AM [name], MD [facility]   12/23/2021 11:45 AM [name], MD [facility]   03/16/2022  2:00 PM [name], MD [facility]       Plan of care and instructions were reviewed with patient. Medications were reviewed and reconciled. Red flags and monitoring parameters for the problems addressed were discussed.     I personally spent 70 minutes face-to-face and non-face-to-face in the care of this patient, which includes all pre, intra, and post visit time on the date of service.      Hamilton Capri, DNP, RN, AGPCNP-C, CWOCN    ------------------------------------------------------------------------------------------------------------  Subjective     HPI Problem Based:    Pt is 81 yo male with hx of chronic back pain with sciatica, diabetes, obesity and COPD who has been on chronic oral steroids for years.  Presents today for ongoing issues of back pain and he believes that he may have psoriatic arthritis.    He is continuing to have sharp pain in the right lower back.  He is followed by the spine center.  Next appointment is in June. Previously has requested a rheum referral but  sx thought to be more consistent with DDD. Working with PT and does not feel like PT helped.    Pain is in lower back and pain can move to right lower buttock/hip area.  Pain is reported as sharp and quick.  No radicular symptoms down the leg reported.  Patient does have history of neuropathy.  He also has pain in the right knee at times and the right neck. When the pain hits his knee- its so bad he cannot walk, though this does not happen that often. Most issues are noted on the right side.     Has taken everything over the counter he an get. Taking supplements for joint pain like glucosamine.  Lidocaine patches to help with some of the pain.    Does have a hx self reported psoriasis. Not on any topicals. No visible rash to ears (reports itching an hx rash here). Subjectively reports a rash to right lateral thigh.     Just got his Trulicity pen this week. He understands that he needs to lose weight but reports he cannot move as well.    Had kidney stones in the past but most recent CT scan with resolution of stones in right kidney.  Still with stones noted in the left kidney though denies pain on the side.        See assessment and plan above for problem-based HPI.    10 system ROS negative other than what is in HPI above.    Past Medical-Surgical History:  Past Medical History:   Diagnosis Date   ??? Arthritis    ??? Asthma    ??? Cancer (CMS-HCC)    ??? Diabetes mellitus (CMS-HCC)    ??? Hearing impairment     hearing aids   ??? Hyperlipidemia    ??? Kidney stone    ??? Myocardial infarction (CMS-HCC)    ??? Peripheral neuropathy    ??? Visual impairment     cataract sx 05/27/20     Past Surgical History:   Procedure Laterality Date   ??? CATARACT EXTRACTION     ??? CATARACT EXTRACTION, BILATERAL     ??? CHG ULTRASONIC GUIDANCE, INTRAOPERATIVE Right 02/21/2021    Procedure: ULTRASONIC GUIDANCE, INTRAOPERATIVE;  Surgeon: Vickii Penna, MD;  Location: [facility] OR [facility];  Service: Urology   ??? esophaus rap surgery     ??? PR CATH PLACE/CORON ANGIO, IMG SUPER/INTERP,W LEFT HEART VENTRICULOGRAPHY N/A 03/31/2015    Procedure: Left Heart Catheterization;  Surgeon: Joanie Coddington, MD;  Location: [facility] CATH;  Service: Cardiology   ??? PR COLONOSCOPY FLX DX W/COLLJ SPEC WHEN PFRMD N/A 08/29/2019    Procedure: COLONOSCOPY, FLEXIBLE, PROXIMAL TO SPLENIC FLEXURE; DIAGNOSTIC, W/WO COLLECTION SPECIMEN BY BRUSH OR WASH;  Surgeon: Leland Her, MD;  [facility];  Service: Gastroenterology   ??? PR PERCUT DRAIN/INJECT RENAL CYST Right 02/21/2021    Procedure: ASPIRATION AND/OR INJECTION OF RENAL CYST OR PELVIS BY NEEDLE, PERCUTANEOUS;  Surgeon: Vickii Penna, MD;  Location: [facility] OR [facility];  Service: Urology   ??? PR PERQ NL/PL LITHOTRP COMPLEX >2 CM MLT LOCATIONS Right 02/21/2021    Procedure: supine standard PCNL; need fortec;  Surgeon: Vickii Penna, MD;  Location[facility] OR [facility];  Service: Urology   ??? PR PVB THORACIC SINGLE INJECTION SITE W/IMG GID Right 02/21/2021    Procedure: PARAVERTEBRAL BLOCK (PVB) (PARASPINOUS BLOCK), THORACIC; SINGLE INJECTION SITE (INCLUDES IMAGING GUIDANCE, WHEN PERFORMED);  Surgeon: Vickii Penna, MD;  Location: [facility] OR [facility];  Service: Urology   ??? PR UPPER GI ENDOSCOPY,BIOPSY N/A 08/29/2019    Procedure: UGI ENDOSCOPY; WITH BIOPSY, SINGLE OR MULTIPLE;  Surgeon: Leland Her, MD;  Location: [facility] PROCEDURES [facility];  Service: Gastroenterology   ??? removal for fatty tumors     ??? SINUS SURGERY     ??? SINUS SURGERY     ??? SKIN BIOPSY     ??? urolift           Social Hx:  Social History     Socioeconomic History   ??? Marital status: Married   Tobacco Use   ??? Smoking status: Former     Packs/day: 1.00     Years: 20.00     Pack years: 20.00     Types: Cigarettes   ??? Smokeless tobacco: Former     Quit date: 1992   ??? Tobacco comments:     30 years ago   Vaping Use   ??? Vaping Use: Never used   Substance and Sexual Activity   ??? Alcohol use: Not Currently     Comment: rarely    ??? Drug use: No   ??? Sexual activity: Not Currently     Partners: Female   Other Topics Concern   ??? Do you use sunscreen? No   ??? Tanning bed use? Yes   ??? Are you easily burned? No   ??? Excessive sun exposure? Yes   ??? Blistering sunburns? Yes   ??? Exercise Yes     Comment: work day to day   ??? Living Situation No       Family Hx:  family history includes Cancer in his mother; Emphysema in his father.    I have reviewed and (if needed) updated the patient's problem list, medications, allergies, past medical and surgical history, social and family history in the EMR.      Objective     There were no vitals taken for this visit.   Wt Readings from Last 3 Encounters:   09/15/21 100.2 kg (221 lb)   09/12/21 (!) 101.7 kg (224 lb 4.8 oz)   08/29/21 (!) 101.6 kg (224 lb)       Physical Exam:  Constitutional: Well-appearing, alert and interactive, appears in no distress.  HEENT: Face symmetric at rest. Normal facial movement bilaterally, including forehead, eye closure and grimace/smile. Hearing intact to conversation. Sclerae and conjunctiva clear.   Resp: Normal WOB  CV: Normal rate  MUSK: Full ROM Joint survey showed mild osteoarthritic changes of the hands and knees. No warmth or swelling of any joints noted .+ TTP in lumbar spine and right gluteus maximus   Strength Eval:  LE Right:L: hip flexion 5/5 quadriceps 5/5, hamstrings 5/5, dorsiflexion 5/5 and plantar flexion 5/5-- Some pain reports to right lumbar spine with right hip adduction and abduction  LE Left: L: hip flexion 5/5 quadriceps 5/5, hamstrings 5/5, dorsiflexion 5/5 and plantar flexion 5/5  NEURO: AxO x 3. Cranial nerves II-XII intact. +straight leg test in right. Light touch sensation equal bilaterally and vibratory sensation equal bilaterally. Strength 5/5 symmetric to BUE and BLE. Reflexes 2+ symmetric patellar. Normal Gait, balanced with no ataxia.   Skin: Warm, dry    Prior labs and diagnostic studies and imaging studies reviewed.     Imaging Results - MR LUMBAR SPINE WO CONTRAST (09/30/2020 2:38 PM EDT)  Procedure Note   Elie Goody, MD - 10/01/2020   Formatting of this note might be  different from the original.  CLINICAL DATA: Chronic low back pain.    EXAM:  MRI LUMBAR SPINE WITHOUT CONTRAST    TECHNIQUE:  Multiplanar, multisequence MR imaging of the lumbar spine was  performed. No intravenous contrast was administered.    COMPARISON: None.    FINDINGS:  Segmentation: Normal    Alignment: Slight retrolisthesis L2-3. Remaining alignment normal.    Vertebrae: Negative for fracture or mass. Hemangioma L5 vertebral  body.    Conus medullaris and cauda equina: Conus extends to the L1 level.  Conus and cauda equina appear normal.    Paraspinal and other soft tissues: Negative for paraspinous mass or  adenopathy. Multiple bilateral renal cysts.    Disc levels:    T12-L1: Negative    L2-3: Mild disc degeneration and Schmorl's node. Negative for  stenosis    L2-3: Disc degeneration asymmetric to the left. Left-sided Schmorl's  node with associated fatty and edema endplate changes on the left.  Moderate facet hypertrophy bilaterally. Mild to moderate spinal  stenosis. Severe subarticular stenosis on the left with expected  impingement left L3 nerve root. Mild left foraminal stenosis. Mild  right subarticular stenosis.    L3-4: Disc degeneration with disc bulging and bilateral facet  hypertrophy. Mild spinal stenosis. Mild to moderate subarticular  stenosis bilaterally    L4-5: Disc degeneration with Schmorl's node. Fatty endplate changes.  Bilateral facet hypertrophy. Moderate spinal stenosis. Moderate  subarticular and foraminal stenosis bilaterally due to spurring,  right greater than left.    L5-S1: Disc degeneration with disc space narrowing and diffuse  endplate spurring. Moderate subarticular stenosis bilaterally due to  spurring.    IMPRESSION:  Multilevel degenerative change in the lumbar spine. Negative for  fracture    Mild to moderate spinal stenosis L2-3. Severe subarticular stenosis  on the left with left L3 nerve root impingement    Mild spinal stenosis L3-4 with mild to moderate subarticular  stenosis bilaterally    Moderate subarticular and foraminal stenosis bilaterally L4-5    Moderate subarticular stenosis bilaterally L5-S1      Electronically Signed  By: Marlan Palau M.D.  On: 10/01/2020 12:46       **Dictated using voice-activated software. Despite editing, typographical errors may exist.

## 2021-10-10 ENCOUNTER — Ambulatory Visit: Admit: 2021-10-10 | Discharge: 2021-10-11 | Payer: MEDICARE

## 2021-10-10 DIAGNOSIS — Z794 Long term (current) use of insulin: Principal | ICD-10-CM

## 2021-10-10 DIAGNOSIS — E1142 Type 2 diabetes mellitus with diabetic polyneuropathy: Principal | ICD-10-CM

## 2021-10-10 DIAGNOSIS — M5136 Other intervertebral disc degeneration, lumbar region: Principal | ICD-10-CM

## 2021-10-10 NOTE — Unmapped (Signed)
It was a pleasure to work with you today.     Okay to stay off Trulicity  -Cnt humulin     --Continue healthy diet and exercise. We will check you blood sugar at next visit     --I will check with the pharmacist about a continuous glucose meter. Im not sure this will be covered by insurance but I will check!     Please return to clinic to see your primary care provider as scheduled.    Thayer Jew, DNP, RN, AGPCNP-C, [facility]  Phone: [phone]  Fax:      [phone]  ______________________________________________________________________    If you have any questions, please call the [facility] at [phone] during regular business hours, or send me a message through MyChart.     Please allow 3 business days for responses to [employer]. Test results will be released immediately in MyChart. Please allow 3 business days for [place] to review and interpret them.    For urgent concerns after hours and weekends, please call the [facility] nurse. Health link (for after hours and weekends): [phone]

## 2021-10-24 DIAGNOSIS — E785 Hyperlipidemia, unspecified: Principal | ICD-10-CM

## 2021-10-24 MED ORDER — PRALUENT PEN 150 MG/ML SUBCUTANEOUS PEN INJECTOR
SUBCUTANEOUS | 3 refills | 84 days
Start: 2021-10-24 — End: ?

## 2021-10-24 NOTE — Unmapped (Signed)
[facility] Refill Coordination Note    Specialty Medication(s) to be Shipped:   General Specialty: Praluent    Other medication(s) to be shipped: No additional medications requested for fill at this time     William Keller, DOB: 01-12-1941  Phone: [phone] (home)       All above HIPAA information was verified with patient.     Was a Nurse, learning disability used for this call? No    Completed refill call assessment today to schedule patient's medication shipment from the [facility] [phone]).  All relevant notes have been reviewed.     Specialty medication(s) and dose(s) confirmed: Regimen is correct and unchanged.   Changes to medications: Dimitrious reports no changes at this time.  Changes to insurance: No  New side effects reported not previously addressed with a pharmacist or physician: None reported  Questions for the pharmacist: No    Confirmed patient received a Conservation officer, historic buildings and a Surveyor, mining with first shipment. The patient will receive a drug information handout for each medication shipped and additional FDA Medication Guides as required.       DISEASE/MEDICATION-SPECIFIC INFORMATION        For patients on injectable medications: Patient currently has 1 doses left.  Next injection is scheduled for 10/31/21.    SPECIALTY MEDICATION ADHERENCE     Medication Adherence    Patient reported X missed doses in the last month: 1  Specialty Medication: Praluent 150mg /ml  Patient is on additional specialty medications: No  Patient is on more than two specialty medications: No              Were doses missed due to medication being on hold? No    Praluent 150 mg/ml: 6 days of medicine on hand       REFERRAL TO PHARMACIST     Referral to the pharmacist: Not needed      [facility]     Shipping address confirmed in Epic.     Delivery Scheduled: Yes, Expected medication delivery date: 11/03/21.  However, Rx request for refills was sent to the provider as there are none remaining. Medication will be delivered via Same Day Courier to the prescription address in Epic WAM.    Nancy Nordmann [facility] Specialty Technician

## 2021-10-25 MED ORDER — PRALUENT PEN 150 MG/ML SUBCUTANEOUS PEN INJECTOR
SUBCUTANEOUS | 3 refills | 84 days | Status: CP
Start: 2021-10-25 — End: ?
  Filled 2021-11-03: qty 6, 84d supply, fill #0

## 2021-10-26 NOTE — Unmapped (Signed)
[facility]  Physical Medicine and Rehabilitation     Patient Name:William Keller  MRN: [id number]  DOB: 1941/02/13  Age: 81 y.o.     ----------------------------------------------------------------------------------------------------------------------  October 27, 2021 6:00 PM. Documentation assistance provided by Winferd Humphrey, medical scribe, at the direction of Valeda Malm, MD.  ----------------------------------------------------------------------------------------------------------------------    ASSESSMENT & PLAN:     10/26/21     DIAGNOSIS:  Lumbar stenosis, facet mediated pain.    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    Was referred [facility], patient wants to stop going as he can do exercises at home. Printed out exercises.    Will order MRI as you have been doing PT over 6wks without relief.    You may continue to take tylenol  naproxen for pain as recent liver/renal labs are okay.    NEXT STEPS/FOLLOW UP: 10 weeks    Will consider facet injection if symptoms worsen or fail to improve      SUBJECTIVE:     Chief Complaint:      Back/neck pain    10/27/21:   Pt presents in f/u after providing a referral to Physical Therapy at the spine center and recommending continuing Tylenol as needed for pain. Today pt reports worsening back pain over the right low back. There is no radiation. 4/10 pain. Worse in mornings, better with movement. Worse bending and riding tractor.  Does note leg cramps and worse pain standing upright.  Feels unsteady.    Meds:  naproxen    History of Present Illness:   Mr. Jarbo is a 81 y.o. year old male being evaluated in consultation at the request of [name], MD for back/neck pain.  I have reviewed the referring provider's notes:  Chronic upper and low back pain, hx L5/S1 ESI.    Currently pt reports neck>back pain. He endorses stiffness and cracking in neck. No radiation down arms. Denies PT or chiropractor. No longer on gabapentin. Denies falls, bowel/bladder incontinence.    Symptom Location: low back, upper back  Symptom Character: stiffness  Symptom Onset/Mechanism: chronic (>/= 3 months)  Temporal Pattern: constant and getting worse  NRS Pain Intensity: 5  Aggravating Factors: excercise, lying down  Alleviating Factors: sitting  Night Pain: no  Unintended Weight Loss: no  Fever/Infection:no  Neuromotor Function: motor weakness - (no),  gait/coordination disturbance - (no), loss of bowel or bladder control - (no), saddle anesthesia - (no)     Prior Interventions/Modalities:  - tylenol  - L5/S1 ESI  - No PT, chiro    Meds:  - Gabapentin, neg side effects  - Lyrica  - Norco, past    Home Exercise Program:  none      Prior Diagnostics:  CT Urogram 01/15/21  I independently reviewed these images today.  BONES AND SOFT TISSUES: Degenerative changes of the spine. Postoperative changes of the left hemithorax with lateral herniation of the lung with adjacent scarring. This was present on the prior exam. Nonspecific subcutaneous stranding of the left ventral pelvic body wall (series 1#95).    MRI Lumbar Spine 09/30/20  IMPRESSION:   - Multilevel degenerative change in the lumbar spine. Negative for   fracture   - Mild to moderate spinal stenosis L2-3. Severe subarticular stenosis   on the left with left L3 nerve root impingement   - Mild spinal stenosis L3-4 with mild to moderate  subarticular   stenosis bilaterally   - Moderate subarticular and foraminal stenosis bilaterally L4-5   - Moderate subarticular stenosis bilaterally L5-S1     MRI Thoracic Spine wo Contrast 05/18/19  IMPRESSION:   Mild thoracic degenerative disc disease without spinal canal or   neural foraminal stenosis.    XR Lumbar Spine 03/25/19  Xrays revealed significant L5-S1 degenerative disc space narrowing with osteophyte formation.  There is mild to moderate L4-L5 disc base narrowing but no significant osteophyte formation.  There is no abnormal curvature.  The pelvis is intact with no hip joint abnormality lesion.      XR Cervical Spine 03/19/18   Xrays revealed a high level cervical degenerative disc changes with C4-C5, C5-6, C6-C7, and C7-T1 degenerative disc changes with osteophyte formation and disc space narrowing.  There is no subluxation or abnormal curvature.     MRI Cervical Spine 03/07/11  I independently reviewed these images today.  IMPRESSION:   Multilevel degenerative changes as described above.      Current Outpatient Medications   Medication Sig Dispense Refill    acetaminophen (TYLENOL EXTRA STRENGTH) 500 MG tablet Take 1 tablet (500 mg total) by mouth two (2) times a day. May try with 1 aleve for acute pain. 100 tablet 2    albuterol 2.5 mg /3 mL (0.083 %) nebulizer solution Inhale 3 mL (2.5 mg total) by nebulization every six (6) hours as needed for wheezing. 3 mL 1    albuterol HFA 90 mcg/actuation inhaler Inhale 2 puffs every six (6) hours as needed for wheezing. 8 g 3    alirocumab (PRALUENT PEN) 150 mg/mL subcutaneous injection Inject the contents of one pen (150 mg total) under the skin every fourteen (14) days. 6 mL 3    clobetasoL (TEMOVATE) 0.05 % cream Apply topically Two (2) times a day. 30 g 0    COVID-19 antigen test (COVID-19 AT-HOME TEST) Kit 2 kits by Miscellaneous route daily as needed. 2 kit 1    empty container (SHARPS-A-GATOR DISPOSAL SYSTEM) Misc Use as directed for sharps disposal 1 each 2    fluocinonide (LIDEX) 0.05 % external solution Apply topically Two (2) times a day. 60 mL 1    fluticasone propion-salmeteroL (ADVAIR HFA) 230-21 mcg/actuation inhaler Inhale 2 puffs Two (2) times a day. 12 g 11    fluticasone propionate (FLONASE) 50 mcg/actuation nasal spray 2 sprays into each nostril daily. 16 g 11    folic acid (FOLVITE) 1 MG tablet Take 1 tablet (1 mg total) by mouth.      insulin NPH-insulin regular, 70/30, (HUMULIN/NOVOLIN) 100 unit/mL (70-30) injection Inject 0.16 mL (16 Units total) under the skin Two (2) times a day. 20 mL 3    insulin syr/ndl U100 half mark 0.5 mL 31 gauge x 15/64 (6 mm) Syrg by Miscellaneous route.       ketoconazole (NIZORAL) 2 % shampoo WASH SCALP 3 TIMES A WEEK. LEAVE IT IN PLACE FOR 30 MINUTES BEFORE RINSING       No current facility-administered medications for this visit.       Allergies:   Glipizide, Actos [pioglitazone], Crestor [rosuvastatin], Esomeprazole magnesium, Esomeprazole magnesium, Liraglutide, Metformin, Midazolam, Sitagliptin, Amoxicillin, Atorvastatin, Erythromycin base, Glimepiride, and Oxycodone    Past Medical / Surgical History:     Past Medical History:   Diagnosis Date    Arthritis     Asthma     Cancer (CMS-HCC)     Diabetes mellitus (CMS-HCC)     Hearing impairment  hearing aids    Hyperlipidemia     Kidney stone     Myocardial infarction (CMS-HCC)     Peripheral neuropathy     Visual impairment     cataract sx 05/27/20       Past Surgical History:   Procedure Laterality Date    CATARACT EXTRACTION      CATARACT EXTRACTION, BILATERAL      CHG ULTRASONIC GUIDANCE, INTRAOPERATIVE Right 02/21/2021    Procedure: ULTRASONIC GUIDANCE, INTRAOPERATIVE;  Surgeon: Vickii Penna, MD;  [facility];  Service: Urology    esophaus rap surgery      PR CATH PLACE/CORON ANGIO, IMG SUPER/INTERP,W LEFT HEART VENTRICULOGRAPHY N/A 03/31/2015    Procedure: Left Heart Catheterization;  Surgeon: Joanie Coddington, MD;  Location: [facility] CATH;  Service: Cardiology    PR COLONOSCOPY FLX DX W/COLLJ SPEC WHEN PFRMD N/A 08/29/2019    Procedure: COLONOSCOPY, FLEXIBLE, PROXIMAL TO SPLENIC FLEXURE; DIAGNOSTIC, W/WO COLLECTION SPECIMEN BY BRUSH OR WASH;  Surgeon: Leland Her, MD;  Location: [facility] PROCEDURES [facility];  Service: Gastroenterology    PR PERCUT DRAIN/INJECT RENAL CYST Right 02/21/2021    Procedure: ASPIRATION AND/OR INJECTION OF RENAL CYST OR PELVIS BY NEEDLE, PERCUTANEOUS;  Surgeon: Vickii Penna, MD;  [facility];  Service: Urology    PR PERQ NL/PL LITHOTRP COMPLEX >2 CM MLT LOCATIONS Right 02/21/2021    Procedure: supine standard PCNL; need fortec;  Surgeon: Vickii Penna, MD;  Location: [facility];  Service: Urology    PR PVB THORACIC SINGLE INJECTION SITE W/IMG GID Right 02/21/2021    Procedure: PARAVERTEBRAL BLOCK (PVB) (PARASPINOUS BLOCK), THORACIC; SINGLE INJECTION SITE (INCLUDES IMAGING GUIDANCE, WHEN PERFORMED);  Surgeon: Vickii Penna, MD;  Location: [facility] OR [facility];  Service: Urology    PR UPPER GI ENDOSCOPY,BIOPSY N/A 08/29/2019    Procedure: UGI ENDOSCOPY; WITH BIOPSY, SINGLE OR MULTIPLE;  Surgeon: Leland Her, MD;  Location: [facility] PROCEDURES [facility];  Service: Gastroenterology    removal for fatty tumors      SINUS SURGERY      SINUS SURGERY      SKIN BIOPSY      urolift         Social History     Socioeconomic History    Marital status: Married   Tobacco Use    Smoking status: Former     Packs/day: 1.00     Years: 20.00     Pack years: 20.00     Types: Cigarettes    Smokeless tobacco: Former     Quit date: 1992    Tobacco comments:     30 years ago   Haematologist Use: Never used   Substance and Sexual Activity    Alcohol use: Not Currently     Comment: rarely     Drug use: No    Sexual activity: Not Currently     Partners: Female   Other Topics Concern    Do you use sunscreen? No    Tanning bed use? Yes    Are you easily burned? No    Excessive sun exposure? Yes    Blistering sunburns? Yes    Exercise Yes     Comment: work day to day    Living Situation No       Family History   Problem Relation Age of Onset    Cancer Mother  throat cancer    Emphysema Father     Melanoma Neg Hx     Basal cell carcinoma Neg Hx     Squamous cell carcinoma Neg Hx        For any of the above entries which indicate no records on file, the patient reports no relevant history.                 Review of Systems: Review of Systems was completed through a 10 organ system review and is listed in the chart.  Pertinent positives are noted in HPI or flowsheet and otherwise negative.  Patient has been instructed to followup with PCP or appropriate specialist for symptoms outside the purview of this speciality.    OBJECTIVE:     Vitals:     Vitals:    10/27/21 1305   Temp: 36.7 ??C (98.1 ??F)         The above medications, allergies, history, and ROS, and vitals have been reviewed.    Physical Exam:   GEN: alert and oriented, no apparent distress  HEENT: normocephalic, atraumatic, anicteric, moist mucous membranes  CV: normal heart rate  PULM: normal work of breathing  GI: nondistended  EXT: no swelling, edema in b/l UE and LE  SKIN: no visible ecchymosis or breakdown  PSYCH: normal mood and affect    NEURO:   Manual Muscle Testing  5/5 in lower limbs    2+ patella  None achilles  Equivocal babinski with no clonus.    No hoffmans  Reduced upper limb reflexes    ----------------------------------------------------------------------------------------------------------------------  October 27, 2021 6:00 PM. Documentation assistance provided by Winferd Humphrey, medical scribe, at the direction of Valeda Malm, MD.  ----------------------------------------------------------------------------------------------------------------------      Tia Masker, MD  Assistant Professor - PM&R  Musculoskeletal and Spine Specialist - [facility] [employer]

## 2021-10-27 ENCOUNTER — Ambulatory Visit
Admit: 2021-10-27 | Discharge: 2021-10-28 | Payer: MEDICARE | Attending: Physical Medicine & Rehabilitation | Primary: Physical Medicine & Rehabilitation

## 2021-10-27 DIAGNOSIS — M479 Spondylosis, unspecified: Principal | ICD-10-CM

## 2021-10-27 NOTE — Unmapped (Addendum)
Thanks so much for coming to see Dr. Riccardo Dubin today. It was a pleasure to meet you. This summary reviews the goals and plans we discussed at your visit today.     Below, you will see: a) your working diagnosis b) your treatment plan and c) your next steps and followup plan.    We care about your quality of life and are committed to helping optimize your functionality.       DIAGNOSIS:  Lumbar stenosis, facet mediated pain.    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    Was [facility], patient wants to stop going as he can do exercises at home. Printed out exercises.    Will order MRI as you have been doing PT over 6wks without relief.    You may continue to take tylenol  naproxen for pain as recent liver/renal labs are okay.    NEXT STEPS/FOLLOW UP: 10 weeks    Will consider facet injection if symptoms worsen or fail to improve           Low Back Pain: Exercises  Introduction  Here are some examples of exercises for you to try. The exercises may be suggested for a condition or for rehabilitation. Start each exercise slowly. Ease off the exercises if you start to have pain.  You will be told when to start these exercises and which ones will work best for you.  How to do the exercises  Press-up   Lie on your stomach, supporting your body with your forearms.  Press your elbows down into the floor to raise your upper back. As you do this, relax your stomach muscles and allow your back to arch without using your back muscles. As your press up, do not let your hips or pelvis come off the floor.  Hold for 15 to 30 seconds, then relax.  Repeat 2 to 4 times.    Alternate arm and leg (bird dog) exercise   Start on the floor, on your hands and knees.  Tighten your belly muscles.  Raise one leg off the floor, and hold it straight out behind you. Be careful not to let your hip drop down, because that will twist your trunk.  Hold for about 6 seconds, then lower your leg and switch to the other leg.  Repeat 8 to 12 times on each leg.  Over time, work up to holding for 10 to 30 seconds each time.  If you feel stable and secure with your leg raised, try raising the opposite arm straight out in front of you at the same time.    Knee-to-chest exercise   Lie on your back with your knees bent and your feet flat on the floor.  Bring one knee to your chest, keeping the other foot flat on the floor (or keeping the other leg straight, whichever feels better on your lower back).  Keep your lower back pressed to the floor. Hold for at least 15 to 30 seconds.  Relax, and lower the knee to the starting position.  Repeat with the other leg. Repeat 2 to 4 times with each leg.  To get more stretch, put your other leg flat on the floor while pulling your knee to your chest.    Curl-ups   Lie on the floor on your back with your knees bent at a 90-degree angle. Your feet should be  flat on the floor, about 12 inches from your buttocks.  Cross your arms over your chest. If this bothers your neck, try putting your hands behind your neck (not your head), with your elbows spread apart.  Slowly tighten your belly muscles and raise your shoulder blades off the floor.  Keep your head in line with your body, and do not press your chin to your chest.  Hold this position for 1 or 2 seconds, then slowly lower yourself back down to the floor.  Repeat 8 to 12 times.    Pelvic tilt exercise   Lie on your back with your knees bent.  Brace your stomach. This means to tighten your muscles by pulling in and imagining your belly button moving toward your spine. You should feel like your back is pressing to the floor and your hips and pelvis are rocking back.  Hold for about 6 seconds while you breathe smoothly.  Repeat 8 to 12 times.    Heel dig bridging   Lie on your back with both knees bent and your ankles bent so that only your heels are digging into the floor. Your knees should be bent about 90 degrees.  Then push your heels into the floor, squeeze your buttocks, and lift your hips off the floor until your shoulders, hips, and knees are all in a straight line.  Hold for about 6 seconds as you continue to breathe normally, and then slowly lower your hips back down to the floor and rest for up to 10 seconds.  Do 8 to 12 repetitions.    Hamstring stretch in doorway   Lie on your back in a doorway, with one leg through the open door.  Slide your leg up the wall to straighten your knee. You should feel a gentle stretch down the back of your leg.  Hold the stretch for at least 15 to 30 seconds. Do not arch your back, point your toes, or bend either knee. Keep one heel touching the floor and the other heel touching the wall.  Repeat with your other leg.  Do 2 to 4 times for each leg.    Hip flexor stretch   Kneel on the floor with one knee bent and one leg behind you. Place your forward knee over your foot. Keep your other knee touching the floor.  Slowly push your hips forward until you feel a stretch in the upper thigh of your rear leg.  Hold the stretch for at least 15 to 30 seconds. Repeat with your other leg.  Do 2 to 4 times on each side.    Wall sit   Stand with your back 10 to 12 inches away from a wall.  Lean into the wall until your back is flat against it.  Slowly slide down until your knees are slightly bent, pressing your lower back into the wall.  Hold for about 6 seconds, then slide back up the wall.  Repeat 8 to 12 times.    Follow-up care is a key part of your treatment and safety. Be sure to make and go to all appointments, and call your doctor if you are having problems. It's also a good idea to know your test results and keep a list of the medicines you take.  Where can you learn more?  Go to [facility] at https://myuncchart.org  [facility] [employer]. Enter [phone] in the search box to learn more about Low Back Pain: Exercises.  Current as  of: November 21, 2017  Content Version: 12.3  ?? 2006-2019 [employer]. Care instructions adapted under license by [facility]. If you have questions about a medical condition or this instruction, always ask your healthcare professional. [employer] disclaims any warranty or liability for your use of this information.

## 2021-11-04 NOTE — Unmapped (Unsigned)
William Keller is very pleasant 81 y.o. -year-old male with a prior history of nephrolithiasis who was previously followed by my partner Lucita Ferrara, [facility] [employer] and Park Breed who presents for further management of kidney stone disease.  The patient has a history of BPH s/p urolift and prior nephrolithiasis s/p right percutaneous nephrolithotomy on 02/21/21 with Dr. Dow Adolph. He denies history gout, does mention that his father and brother both had gout. Denies chronic diarrhea,. Denies calcium/vitamin D supplementation.  Notes positive family history of kidney stones in his daughter. He reports that he was previously on potassium citrate prescribed by local urologist but discontinued due to GI upset. Now currently taking daily.      He notes that .he has had symptoms for the past {Blank multiple:19196::few days,few weeks,few months,few years}.  The symptoms are {Blank single:19197}. Exacerbating factors include {Blank single:19197}. The symptoms are improved with {Blank single:19197}.  He has {Blank multiple:19196::no previous history of kidney stones,a previous history of kidney stones which includes }. {Blank single:19197}. The patient {Blank multiple:19196::denies any lower urinary tract symptoms, specifically dysuria, hematuria or urinary tract infection,complains of lower urinary tract symptoms, specifically} {Blank multiple:19196::dysuria,hematuria,urinary tract infection}. The patient has had {No/  **:31982} history of diarrhea or gout. The patient has {Blank multiple:19196::no history of fevers, chills, nausea, vomiting, chest pain or shortness of breath,a history of,fevers,chills,nausea,vomiting,chest pain,shortness of breath}.  He {Actions; denies/admits to:5300} a history of TUMS or currently on any calcium supplementation.    PAST MEDICAL HISTORY:     Past Medical History:   Diagnosis Date    Arthritis     Asthma     Cancer (CMS-HCC)     Diabetes mellitus (CMS-HCC)     Hearing impairment     hearing aids    Hyperlipidemia     Kidney stone     Myocardial infarction (CMS-HCC)     Peripheral neuropathy     Visual impairment     cataract sx 05/27/20       PAST SURGICAL HISTORY:   Past Surgical History:   Procedure Laterality Date    CATARACT EXTRACTION      CATARACT EXTRACTION, BILATERAL      CHG ULTRASONIC GUIDANCE, INTRAOPERATIVE Right 02/21/2021    Procedure: ULTRASONIC GUIDANCE, INTRAOPERATIVE;  Surgeon: Vickii Penna, MD;  Location: [facility];  Service: Urology    esophaus rap surgery      PR CATH PLACE/CORON ANGIO, IMG SUPER/INTERP,W LEFT HEART VENTRICULOGRAPHY N/A 03/31/2015    Procedure: Left Heart Catheterization;  Surgeon: Joanie Coddington, MD;  Location: [facility] CATH;  Service: Cardiology    PR COLONOSCOPY FLX DX W/COLLJ SPEC WHEN PFRMD N/A 08/29/2019    Procedure: COLONOSCOPY, FLEXIBLE, PROXIMAL TO SPLENIC FLEXURE; DIAGNOSTIC, W/WO COLLECTION SPECIMEN BY BRUSH OR WASH;  Surgeon: Leland Her, MD;  Location: [facility]ED[facility];  Service: Gastroenterology    PR PERCUT DRAIN/INJECT RENAL CYST Right 02/21/2021    Procedure: ASPIRATION AND/OR INJECTION OF RENAL CYST OR PELVIS BY NEEDLE, PERCUTANEOUS;  Surgeon: Vickii Penna, MD;  Location: [facility] OR [facility];  Service: Urology    PR PERQ NL/PL LITHOTRP COMPLEX >2 CM MLT LOCATIONS Right 02/21/2021    Procedure: supine standard PCNL; need fortec;  Surgeon: Vickii Penna, MD;  Location: [facility] OR [facility];  Service: Urology    PR PVB THORACIC SINGLE INJECTION SITE W/IMG GID Right 02/21/2021    Procedure: PARAVERTEBRAL BLOCK (PVB) (PARASPINOUS BLOCK), THORACIC; SINGLE INJECTION SITE (INCLUDES IMAGING GUIDANCE, WHEN PERFORMED);  Surgeon: Vickii Penna, MD;  Location: [facility] OR [facility];  Service: Urology    PR UPPER GI ENDOSCOPY,BIOPSY N/A 08/29/2019    Procedure: UGI ENDOSCOPY; WITH BIOPSY, SINGLE OR MULTIPLE;  Surgeon: Leland Her, MD; Location: [facility]ED[facility];  Service: Gastroenterology    removal for fatty tumors      SINUS SURGERY      SINUS SURGERY      SKIN BIOPSY      urolift         ALLERGIES:    is allergic to glipizide, actos [pioglitazone], crestor [rosuvastatin], esomeprazole magnesium, esomeprazole magnesium, liraglutide, metformin, midazolam, sitagliptin, amoxicillin, atorvastatin, erythromycin base, glimepiride, and oxycodone.    MEDICATIONS:  Current Outpatient Medications   Medication Sig Dispense Refill    alirocumab (PRALUENT PEN) 150 mg/mL subcutaneous injection Inject the contents of one pen (150 mg total) under the skin every fourteen (14) days. 6 mL 3    TRULICITY 0.75 mg/0.5 mL injection pen Inject 0.5 mL (0.75 mg total) under the skin once a week.      acetaminophen (TYLENOL EXTRA STRENGTH) 500 MG tablet Take 1 tablet (500 mg total) by mouth two (2) times a day. May try with 1 aleve for acute pain. 100 tablet 2    albuterol 2.5 mg /3 mL (0.083 %) nebulizer solution Inhale 3 mL (2.5 mg total) by nebulization every six (6) hours as needed for wheezing. 3 mL 1    albuterol HFA 90 mcg/actuation inhaler Inhale 2 puffs every six (6) hours as needed for wheezing. 8 g 3    clobetasoL (TEMOVATE) 0.05 % cream Apply topically Two (2) times a day. 30 g 0    COVID-19 antigen test (COVID-19 AT-HOME TEST) Kit 2 kits by Miscellaneous route daily as needed. 2 kit 1    empty container (SHARPS-A-GATOR DISPOSAL SYSTEM) Misc Use as directed for sharps disposal 1 each 2    fluocinonide (LIDEX) 0.05 % external solution Apply topically Two (2) times a day. 60 mL 1    fluticasone propion-salmeteroL (ADVAIR HFA) 230-21 mcg/actuation inhaler Inhale 2 puffs Two (2) times a day. 12 g 11    fluticasone propionate (FLONASE) 50 mcg/actuation nasal spray 2 sprays into each nostril daily. 16 g 11    folic acid (FOLVITE) 1 MG tablet Take 1 tablet (1 mg total) by mouth.      insulin NPH-insulin regular, 70/30, (HUMULIN/NOVOLIN) 100 unit/mL (70-30) injection Inject 0.16 mL (16 Units total) under the skin Two (2) times a day. 20 mL 3    insulin syr/ndl U100 half mark 0.5 mL 31 gauge x 15/64 (6 mm) Syrg by Miscellaneous route.       ketoconazole (NIZORAL) 2 % shampoo WASH SCALP 3 TIMES A WEEK. LEAVE IT IN PLACE FOR 30 MINUTES BEFORE RINSING       No current facility-administered medications for this visit.        SOCIAL HISTORY:  Social History     Tobacco Use    Smoking status: Former     Packs/day: 1.00     Years: 20.00     Pack years: 20.00     Types: Cigarettes    Smokeless tobacco: Former     Quit date: 1992    Tobacco comments:     30 years ago   Advertising account planner    Vaping Use: Never used   Substance Use Topics    Alcohol use: Not Currently     Comment: rarely  Drug use: No        FAMILY HISTORY:  {NEGATIVE / POSITIVE / UNKNOWN:20651} for kidney stones disease.  Family History   Problem Relation Age of Onset    Cancer Mother         throat cancer    Emphysema Father     Melanoma Neg Hx     Basal cell carcinoma Neg Hx     Squamous cell carcinoma Neg Hx        REVIEW OF SYSTEMS:  A 10-system review of systems was completed  by the patient and reviewed by me today.  All pertinent positives  were discussed in history of present illness.    PHYSICAL EXAMINATION:  VITAL SIGNS:  The patient is afebrile.  Vital signs are stable.  GENERAL:  In no apparent distress.  HEENT:  Eyes, ears and mouth appeared normal.  LYMPHATICS:  No cervical lymphadenopathy.  LUNGS:  Chest wall excursion symmetric bilaterally. No audible wheezing  ABDOMEN:  Soft, nontender, nondistended.  BACK:  {NEGATIVE / POSITIVE / UNKNOWN:20651} CVA tenderness.  EXTREMITIES:  No lower extremity edema.  SKIN:  No rashes or jaundice.  NEUROLOGIC:  No focal deficits on neurologic exam.    LABORATORY TESTING:    100% Uric acid       Chemistry        Component Value Date/Time    NA 142 09/15/2021 0919    NA 137 01/09/2014 0911    K 4.6 09/15/2021 0919    K 4.6 01/09/2014 0911    CL 107 09/15/2021 0919 CL 100 01/09/2014 0911    CO2 28.5 09/15/2021 0919    CO2 28 01/09/2014 0911    BUN 25 (H) 09/15/2021 0919    BUN 10 01/09/2014 0911    CREATININE 0.94 09/15/2021 0919    CREATININE 1.1 01/15/2021 1406    GLU 139 09/15/2021 0919        Component Value Date/Time    CALCIUM 9.3 09/15/2021 0919    CALCIUM 9.4 01/09/2014 0911    ALKPHOS 74 05/12/2021 1325    AST 14 05/12/2021 1325    AST 37 07/20/2014 0916    ALT 28 05/12/2021 1325    ALT 23 07/20/2014 0916    BILITOT 0.4 05/12/2021 1325            IMAGING:  01/15/2021 CT urogram  Impression       1. Bilateral nephrolithiasis and numerous bilateral renal cysts.       2. Mild nonspecific thickening of the urinary bladder which may be related to underdistention, chronic bladder outlet obstruction and/or cystitis. Clinical correlation is advised.       3. Hepatic steatosis.       4. Additional findings, as described above.       04/10/2021 RUS  Impression   -No hydronephrosis.   -Bilateral renal cysts and renal calculi, seen on prior CT.   -Partially visualized echogenic liver suggesting hepatic steatosis.       07/19/2021 CT  Impression   1. Resolution of previously seen right-sided renal stones following percutaneous nephrostolithotomy.   2. Persistent and unchanged left-sided renal stone measuring up to 0.8 cm.   2. Additional chronic and incidental findings as above.

## 2021-11-15 ENCOUNTER — Ambulatory Visit: Admit: 2021-11-15 | Discharge: 2021-11-16 | Payer: MEDICARE | Attending: Registered" | Primary: Registered"

## 2021-11-15 ENCOUNTER — Ambulatory Visit: Admit: 2021-11-15 | Discharge: 2021-11-16 | Payer: MEDICARE

## 2021-11-15 ENCOUNTER — Ambulatory Visit: Admit: 2021-11-15 | Discharge: 2021-11-16 | Payer: MEDICARE | Attending: Urology | Primary: Urology

## 2021-11-15 NOTE — Unmapped (Signed)
[facility] Outpatient Nutrition Services   Medical Nutrition Therapy Consultation   Patient is HOH    Visit Type:    Initial Assessment    Referral Reason: :  Nephrolithiasis Medical Nutrition Therapy  N20.0 (ICD-10-CM) - Nephrolithiasis     William Keller is a 81 y.o. male seen for medical nutrition therapy for ne. His active problem list, medication list, allergies, family history, social history, notes from last several encounters, and lab results were reviewed.     His kidney stone history includes:   Onset : 2002   Type : uric acid   Risk Factors:  hydration , heredity, weight    Anthropometrics on November 15, 2021  Height: 175.3 cm (5' 9)   Weight: 98.9 kg (218 lb)  Body mass index is 32.19 kg/m??.  Obese (class I)    Wt Readings from Last 5 Encounters:   11/15/21 98.9 kg (218 lb)   11/15/21 99.2 kg (218 lb 9.6 oz)   10/27/21 (!) 101.1 kg (222 lb 12.8 oz)   10/10/21 100.6 kg (221 lb 12.8 oz)   09/15/21 100.2 kg (221 lb)        Usual body weight: 218 pounds for the past year    Ideal Body Weight: 72.64 kg  Weight in (lb) to have BMI = 25: 168.9    Nutrition Risk Screening:     Nutrition Focused Physical Exam:     Patient does not meet AND/ASPEN criteria for malnutrition at this time (11/15/21 1248)      Malnutrition Screening:   Patient does not meet AND/ASPEN criteria for malnutrition at this time (11/15/21 1248)    Biochemical Data, Medical Tests and Procedures:  All pertinent labs reviewed by [name] and Myrtis Ser prior to visit  Fasting blood sugar 160 mg/dL trying to reduce his insulin  05/02/21 HgbA1 7.9 %     No litholink available at this time      Medications and Vitamin/Mineral Supplementation:   All nutritionally pertinent medications reviewed on 11/15/2021.   Current Outpatient Medications   Medication Sig Dispense Refill    acetaminophen (TYLENOL EXTRA STRENGTH) 500 MG tablet Take 1 tablet (500 mg total) by mouth two (2) times a day. May try with 1 aleve for acute pain. 100 tablet 2 albuterol 2.5 mg /3 mL (0.083 %) nebulizer solution Inhale 3 mL (2.5 mg total) by nebulization every six (6) hours as needed for wheezing. 3 mL 1    albuterol HFA 90 mcg/actuation inhaler Inhale 2 puffs every six (6) hours as needed for wheezing. 8 g 3    alirocumab (PRALUENT PEN) 150 mg/mL subcutaneous injection Inject the contents of one pen (150 mg total) under the skin every fourteen (14) days. 6 mL 3    clobetasoL (TEMOVATE) 0.05 % cream Apply topically Two (2) times a day. 30 g 0    COVID-19 antigen test (COVID-19 AT-HOME TEST) Kit 2 kits by Miscellaneous route daily as needed. 2 kit 1    empty container (SHARPS-A-GATOR DISPOSAL SYSTEM) Misc Use as directed for sharps disposal 1 each 2    fluocinonide (LIDEX) 0.05 % external solution Apply topically Two (2) times a day. 60 mL 1    fluticasone propion-salmeteroL (ADVAIR HFA) 230-21 mcg/actuation inhaler Inhale 2 puffs Two (2) times a day. 12 g 11    fluticasone propionate (FLONASE) 50 mcg/actuation nasal spray 2 sprays into each nostril daily. 16 g 11    folic acid (FOLVITE) 1 MG tablet Take 1 tablet (  1 mg total) by mouth.      insulin NPH-insulin regular, 70/30, (HUMULIN/NOVOLIN) 100 unit/mL (70-30) injection Inject 0.16 mL (16 Units total) under the skin Two (2) times a day. 20 mL 3    insulin syr/ndl U100 half mark 0.5 mL 31 gauge x 15/64 (6 mm) Syrg by Miscellaneous route.       ketoconazole (NIZORAL) 2 % shampoo WASH SCALP 3 TIMES A WEEK. LEAVE IT IN PLACE FOR 30 MINUTES BEFORE RINSING      TRULICITY 0.75 mg/0.5 mL injection pen Inject 0.5 mL (0.75 mg total) under the skin once a week.       No current facility-administered medications for this visit.       Nutrition Supplement Use:   Calcium: No  Vitamin D: Yes  Vitamin C: No  Tumeric:  No  Cranberry extract:  No  Other: vitamin B12     Nutrition History:     Dietary Restrictions: No known food allergies or food intolerances.     Gastrointestinal Issues: Heartburn avoid green peppers and cucumber Hunger and Satiety:  Reports good appetite  Denies chewing,swallowing, nausea, vomiting, and diarrhea     Food Safety and Access: He did not report issues.     Typical Diet Recall:   Time Intake   Breakfast Skips or egg x with cinnamon or egg bacon or sausage with biscuit (homemade) with coffee with cream and honey   Snack (AM) Fruit with nuts    Lunch Sardines with crackers or leftovers or sandwich (chicken sandwich from [employer] or [employer] hot dogs) with water    Snack (PM) Protein bar or peanuts    Dinner Shrimp and oyster with baked potato hush puppies and slaw with water at [facility] or chicken with pea (crowder peas with corn) green collards squash) with water or pork chop with rice/mashed potato/ sweet potato with beets with water    Snack (HS) Peanuts or trail mix      Food-Related History:  Snacks:  Peanuts or trail mix   Beverages:  water or lemonade  Dining Out:  3/21  Cooking Methods: bake or stove top  Usual Food Choices: listed above    Meal Schedule:  yes    Eating Behaviors:  Overeating: Lacks portion control with meals and snacks.   Emotional Eating: He noted eating to deal with feelings other than hunger including boredom.      Physical Activity:  Physical activity level is light with some exercise.     Daily Estimated Nutrient Needs:  Energy:   1626-1768 kcals [  23-25 kcal using 70.7 kg , ideal body weight  ]  Protein:  57-71  gm [  0.8-1 grams using  70.7 kg , ideal body weight ,  ]  Carbohydrate:gram based on 45% total calories  Fluid:   per MD or at least 2.5-3 L  Sodium:  <2000 mg     Nutrition Goals & Evaluation      Maintain weight or weight loss: 16  lb (New)  Prevent further stone formation  (New)  Meet nutritional needs  (New)  Apply recommended dietary strategies to reduce long term health risk  (New)      Nutrition goals reviewed, and relevant barriers identified and addressed: knowledge deficit . He is evaluated to have good willingness and ability to achieve nutrition goals. Nutrition Assessment       Current diet is not appropriate nor adequate to prevent kidney stones based on his typical  dietary intake. He consumed highly processed/sodium foods, has limited fruit and vegetable consumption. He would benefit from nutritional changes to assist with the prevention of kidney stones.     Patient has been weight stable over the past few years.     He presents with food and nutrition-related knowledge deficit related to dietary lifestyle as evidenced by patient report of not knowing how to modify dietary practices and behaviors to prevent  possible uric acid stone formation. Nutrition history indicates irregular meal pattern, obesity, and eating out   Dietary-related risk factors for kidney stones include: excessive weight, limited fruit and vegetables consumption, possible limited hydration and highly processed foods/high sodium foods      Nutrition Intervention      Nutrition Education: Diet/eating style to prevent possible uric stone formation(no litholink). Education resources provided include: handouts promoting nutrition intervention , after visit summary with patient instructions , and contact information   - Nutrition Education: renal stone prevention medical nutrition therapy diet/eating style. Education resources provided include: handouts promoting nutrition intervention , after visit summary with patient instructions , and contact information   - Meals and Snacks    Nutrition Plan:   Fluids:  Aim for drinking 2.5 -  3 liters of fluids per day.  This is about the same as 2.5 - 3 quarts or 80 - 100 ounces.      Citrate:  Add citrate, which is a stone inhibitor, by adding lemon or lime juice to beverages or foods.  Calorie-free lemonade, such as Crystal Light, is another way to get citrate.    Sodium:  Keep sodium intake below 2000 milligrams per day.  Read food labels to find out how much sodium is in your usual food choices.    Balance:  Have smaller portions of foods with a high acid load - meat, poultry, fish, cheese, sugar, and refined grains like white bread.  Balance these foods by always including fruits or vegetables at eating times.  Doing this will help to neutralize the acid load of a meal.    See the Kidney Stone Nutrition Therapy handout for more details on these recommendations.      Follow up will occur in 1 year or when consulted.       Food/Nutrition-related history, Anthropometric measurements, Biochemical data, medical tests, procedures, Nutrition-focused physical findings, Patient understanding or compliance with intervention and recommendations , and Effectiveness of nutrition interventions will be assessed at time of follow-up.       Recommendations for Clinical Team:  Fluids:  Aim for drinking 2.5 -  3 liters of fluids per day.  This is about the same as 2.5 - 3 quarts or 80 - 100 ounces.      Citrate:  Add citrate, which is a stone inhibitor, by adding lemon or lime juice to beverages or foods.  Calorie-free lemonade, such as Crystal Light, is another way to get citrate.    Sodium:  Keep sodium intake below 2000 milligrams per day.  Read food labels to find out how much sodium is in your usual food choices.    Balance:  Have smaller portions of foods with a high acid load - meat, poultry, fish, cheese, sugar, and refined grains like white bread.  Balance these foods by always including fruits or vegetables at eating times.  Doing this will help to neutralize the acid load of a meal.        See the Kidney Stone Nutrition Therapy handout for more details on these  recommendations.             Time spent 43 minutes     Myrtis Ser, [facility] dietetic intern was present for this entire visit.     Greta Doom, MS RD - Clinical Dietitian II  Outpatient Nutrition Services   [facility]   [address] [place], [place] [address]  p [phone]  [email or web address]

## 2021-11-15 NOTE — Unmapped (Signed)
Referring Provider: Danelle Keller*     PCP:  William Pontiff, MD      11/15/2021    Chief Complaint: Nephrolithiasis    HPI:  [name] is a 81 year old man with PMH DM, CAD, HTN, HLD, bladder cancer, nephrolithiasis presents for new patient evaluation in multidisciplinary stone clinic.  He patient is s/p urolift for BPH and s/p right percutaneous nephrolithotomy on 02/21/21 with stone analysis showing 100% uric acid. He was previously followed by a local urologist and rx potassium citrate for stone prevention. He doesn't remember why he stopped taking this but states it may have upset his stomach but he is not sure.        He states he has right sided pain but he is not  sure whether part of this is associated with musculoskeletal. He denies dysuria, visible hematuria, fever or chills. His appetite a little suppressed associated with trulicity. He denies nausea, emesis or diarrhea. Last week or so trying to get off of insulin. Dad suffered from gout and brother but no personal history of gout. Daughter with history of kidney stones.     ROS:   CONSTITUTIONAL: denies fevers or chills  CARDIOVASCULAR: denies chest pain, denies dyspnea on exertion  GASTROINTESTINAL: per HPI  GENITOURINARY: per HPI  All systems reviewed and are negative except as listed above.    PAST MEDICAL HISTORY:  Past Medical History:   Diagnosis Date    Arthritis     Asthma     Cancer (CMS-HCC)     Diabetes mellitus (CMS-HCC)     Hearing impairment     hearing aids    Hyperlipidemia     Kidney stone     Myocardial infarction (CMS-HCC)     Peripheral neuropathy     Visual impairment     cataract sx 05/27/20       ALLERGIES  Glipizide, Actos [pioglitazone], Crestor [rosuvastatin], Esomeprazole magnesium, Esomeprazole magnesium, Liraglutide, Metformin, Midazolam, Sitagliptin, Amoxicillin, Atorvastatin, Erythromycin base, Glimepiride, and Oxycodone    SOCIAL HISTORY  Social History     Socioeconomic History    Marital status: Married Tobacco Use    Smoking status: Former     Packs/day: 1.00     Years: 20.00     Pack years: 20.00     Types: Cigarettes    Smokeless tobacco: Former     Quit date: 1992    Tobacco comments:     30 years ago   Haematologist Use: Never used   Substance and Sexual Activity    Alcohol use: Not Currently     Comment: rarely     Drug use: No    Sexual activity: Not Currently     Partners: Female   Other Topics Concern    Do you use sunscreen? No    Tanning bed use? Yes    Are you easily burned? No    Excessive sun exposure? Yes    Blistering sunburns? Yes    Exercise Yes     Comment: work day to day    Living Situation No         FAMILY HISTORY  Family History   Problem Relation Age of Onset    Cancer Mother         throat cancer    Emphysema Father     Melanoma Neg Hx     Basal cell carcinoma Neg Hx     Squamous cell carcinoma Neg  Hx         MEDICATIONS:  Current Outpatient Medications   Medication Sig Dispense Refill    acetaminophen (TYLENOL EXTRA STRENGTH) 500 MG tablet Take 1 tablet (500 mg total) by mouth two (2) times a day. May try with 1 aleve for acute pain. 100 tablet 2    albuterol 2.5 mg /3 mL (0.083 %) nebulizer solution Inhale 3 mL (2.5 mg total) by nebulization every six (6) hours as needed for wheezing. 3 mL 1    albuterol HFA 90 mcg/actuation inhaler Inhale 2 puffs every six (6) hours as needed for wheezing. 8 g 3    alirocumab (PRALUENT PEN) 150 mg/mL subcutaneous injection Inject the contents of one pen (150 mg total) under the skin every fourteen (14) days. 6 mL 3    clobetasoL (TEMOVATE) 0.05 % cream Apply topically Two (2) times a day. 30 g 0    COVID-19 antigen test (COVID-19 AT-HOME TEST) Kit 2 kits by Miscellaneous route daily as needed. 2 kit 1    empty container (SHARPS-A-GATOR DISPOSAL SYSTEM) Misc Use as directed for sharps disposal 1 each 2    fluocinonide (LIDEX) 0.05 % external solution Apply topically Two (2) times a day. 60 mL 1    fluticasone propion-salmeteroL (ADVAIR HFA) 230-21 mcg/actuation inhaler Inhale 2 puffs Two (2) times a day. 12 g 11    fluticasone propionate (FLONASE) 50 mcg/actuation nasal spray 2 sprays into each nostril daily. 16 g 11    folic acid (FOLVITE) 1 MG tablet Take 1 tablet (1 mg total) by mouth.      insulin NPH-insulin regular, 70/30, (HUMULIN/NOVOLIN) 100 unit/mL (70-30) injection Inject 0.16 mL (16 Units total) under the skin Two (2) times a day. 20 mL 3    insulin syr/ndl U100 half mark 0.5 mL 31 gauge x 15/64 (6 mm) Syrg by Miscellaneous route.       ketoconazole (NIZORAL) 2 % shampoo WASH SCALP 3 TIMES A WEEK. LEAVE IT IN PLACE FOR 30 MINUTES BEFORE RINSING       No current facility-administered medications for this visit.       PHYSICAL EXAM:    BP 140/81  CONSTITUTIONAL: Alert,well appearing, no distress  EYES: Extra ocular movements intact, sclerae anicteric.  NECK: Supple, no lymphadenopathy  CARDIOVASCULAR: Regular, normal S1/S2 heart sounds, no rubs.   PULM: Clear to auscultation bilaterally  GASTROINTESTINAL: Soft, nontender, no CVAT  EXTREMITIES: trace lower extremity edema bilaterally.       MEDICAL DECISION MAKING    Results for orders placed or performed in visit on 09/15/21   Testosterone, free, total   Result Value Ref Range    Testosterone, Free 4.83 3.08 - 11.3 ng/dL    Testosterone (Mayo) 181 (L) 240 - 950 ng/dL   Basic Metabolic Panel   Result Value Ref Range    Sodium 142 135 - 145 mmol/L    Potassium 4.6 3.4 - 4.8 mmol/L    Chloride 107 98 - 107 mmol/L    CO2 28.5 20.0 - 31.0 mmol/L    Anion Gap 7 5 - 14 mmol/L    BUN 25 (H) 9 - 23 mg/dL    Creatinine 1.61 0.96 - 1.10 mg/dL    BUN/Creatinine Ratio 27     eGFR CKD-EPI (2021) Male 83 >=60 mL/min/1.35m2    Glucose 139 70 - 179 mg/dL    Calcium 9.3 8.7 - 04.5 mg/dL   POCT glucose   Result Value Ref Range    Glucose, POC  176 65 - 179 mg/dL    Glucose Strip Lot Num see log     Glucose Strip Exp see log         Lab Results   Component Value Date    NA 142 09/15/2021    K 4.6 09/15/2021 CL 107 09/15/2021    CO2 28.5 09/15/2021    BUN 25 (H) 09/15/2021    CREATININE 0.94 09/15/2021    GFRAA >=60 10/10/2017    GFRNONAA >=60 10/10/2017    ALBUMIN 3.9 05/12/2021     Lab Results   Component Value Date    WBC 6.1 05/02/2021    PLT 188 05/02/2021     Lab Results   Component Value Date    CALCIUM 9.3 09/15/2021         IMAGING STUDIES: CT scan 06/2021: Smooth renal contours.  Nonobstructing left-sided nephrolithiasis measuring up to 0.8 cm (2:72), unchanged. Resolution of previously seen numerous right-sided renal stones. Numerous surgical clips/sutures noted at the level of the right UVJ. Unchanged numerous bilateral renal cysts measuring up to 3.4 cm on the right (2:63) and 7.8 cm on the left (2:54). No ureteral dilatation or collecting system distention.     ASSESSMENT/PLAN:      Mr.William Keller is a 81 y.o. year old patient with a past medical history significant for as above who is being seen for new patient evaluation.     Nephrolithiasis - We discussed stone epidemiology, evaluation and general dietary and medical strategies for prevention. I reviewed the association with weight, DM and UA nephrolithiasis. We discussed the importance of hydration to maximize urine output. We reviewed beverage type I discussed recommendation to avoid beverages with high fructose corn syrup as they are associated with increased risk of stones. I also reviewed the benefit of following a diet lower in sodium, protein and higher in fruits and vegetables. We reviewed adding lemon/lime to water to enhance citrate as well as OTC citrate supplements including litholyte.    DM - A1C 07/2021 7.5, we discussed importance of DM control and last urine albumin above normal. Discussed depending on results of DM control and weight and BP with current therapy could consider addition of ACE/ARB, SGLT2i

## 2021-11-15 NOTE — Unmapped (Addendum)
Reviewed kidney stone history and current symptoms  Prior stone composition uric acid  Reviewed importance of good diabetes and sugar control  Reviewed prior potassiums citrate supplementation; consider restarting citrate supplementation versus over-the-counter supplements such as litholyte ([email or web address] 1 packet twice daily)  Reviewed prior CT imaging  Recommend 24-hour urine study  Return to stone clinic in 8 to 10 months with a repeat renal ultrasound prior with nutrition, nephrology and urology    [facility]: Kidney Stone Prevention     Kidney stones can form due to one or all of the following:     1. Low fluid Intake   2. Low citrate in the urine   3. Too much salt, oxalate, and animal protein     The following dietary changes may decrease your risk of forming stones.     1. Increase your fluid intake to around 2.5 - 3 liters per day. Diluting the urine hinders the formation of stones. You should drink enough fluid to make 2 liters of urine a day. You know you are drinking enough fluid if your urine is clear.     2. Decrease your salt intake to less than 2500mg /day   This will decrease the amount of calcium excreted in your urine. Eat more fresh or frozen vegetables instead of canned. Eat less processed meats. At restaurants request your food to be prepared without salt or high sodium seasoning.     3. Limit the amount of Oxalate containing foods in your diet to 50mg  per day.   This will decrease the amount of oxalate excreted in your urine.     Spinach   Potato  Nuts   Peanut Butter   Chocolate     4. Limit the amount of animal proteins in your diet to approximately the size of 1 deck of cards per day  This will decrease the amount of uric acid excreted in your urine.     Fish   Liver   Chicken   Red Meat - no more than 3 servings per week     5. Increase the amount of citrate in your diet. Most fruits and vegetables are high in citrate. Increasing the amount of fruit and vegetable your diet will raise your citrate. Ideally you should have 5  servings per day. Lemons and limes have the highest amount of citrate. A recipe for lemonade may be helpful to increase citrate in your diet: 1/2 cup concentrated lemon juice to 2 quarts of water. Sweeten to taste.    If you would like more information on kidney stone prevention or some recipe ideas for healthy meals in stone prevention, please go to:    [email or web address]

## 2021-11-15 NOTE — Unmapped (Signed)
ANALYSIS AND PLAN:  This is a 81 y.o. male with a history of nephrolithiasis, diabetes, bladder cancer, and asthma who is noted with right sided flank pain. I discussed with the patient his imaging studies as well as his risk of stone progression. He has a history of producing Uric Acid stones. We reviewed his most recent CT from 2/23 that revealed a stable left-sided renal stone measuring up to 0.8 cm. He was noted with microscopic hematuria in 2/23 UA, 57 RBC/hpf with a negative urine culture. I encouraged the importance of good sugar control to help with stone prevention. We discussed the importance of general dietary prevention for stones including good fluid intake and I again provided the patient with a general dietary handout. I reviewed his prior potassium citrate supplementation and advised restarting citrate supplementation versus over-the-counter supplements such as litholyte. I recommend a 24-hour urine study to have a better understanding of his stone formation.     He will return to clinic in 8 to 10 months to reassess his symptoms. We would be happy to see him sooner if symptoms arise.       CHIEF COMPLAINT: History of Kidney stone here for evaluation in the multidisciplinary kidney stone clinic    BRIEF HISTORY OF PRESENT ILLNESS:  William Keller is very pleasant 81 y.o. male with a prior history of nephrolithiasis who was previously followed by my partner Lucita Ferrara, Gordan Payment and Park Breed who presents for further management of kidney stone disease.  The patient has a history of BPH s/p urolift and prior nephrolithiasis s/p right percutaneous nephrolithotomy on 02/21/21 with Dr. Dow Adolph. He denies history gout, does mention that his father and brother both had gout. Denies chronic diarrhea. Denies calcium/vitamin D supplementation.  Notes positive family history of kidney stones in his daughter. He reports that he was previously on potassium citrate prescribed by local urologist but discontinued due to GI upset. He is no longer taking daily. He is in clinic today with his wife. Since his surgery he has not had any stone passage. He denies dysuria, hematuria or UTIs. The patient has no history of fevers, chills, nausea, vomiting, chest pain or shortness of breath.    PAST MEDICAL HISTORY:     Past Medical History:   Diagnosis Date   ??? Arthritis    ??? Asthma    ??? Cancer (CMS-HCC)    ??? Diabetes mellitus (CMS-HCC)    ??? Hearing impairment     hearing aids   ??? Hyperlipidemia    ??? Kidney stone    ??? Myocardial infarction (CMS-HCC)    ??? Peripheral neuropathy    ??? Visual impairment     cataract sx 05/27/20       PAST SURGICAL HISTORY:   Past Surgical History:   Procedure Laterality Date   ??? CATARACT EXTRACTION     ??? CATARACT EXTRACTION, BILATERAL     ??? CHG ULTRASONIC GUIDANCE, INTRAOPERATIVE Right 02/21/2021    Procedure: ULTRASONIC GUIDANCE, INTRAOPERATIVE;  Surgeon: Vickii Penna, MD;  [facility]: [facility];  Service: Urology   ??? esophaus rap surgery     ??? PR CATH PLACE/CORON ANGIO, IMG SUPER/INTERP,W LEFT HEART VENTRICULOGRAPHY N/A 03/31/2015    Procedure: Left Heart Catheterization;  Surgeon: Joanie Coddington, MD;  Location: [facility] CATH;  Service: Cardiology   ??? PR COLONOSCOPY FLX DX W/COLLJ SPEC WHEN PFRMD N/A 08/29/2019    Procedure: COLONOSCOPY, FLEXIBLE, PROXIMAL TO SPLENIC FLEXURE; DIAGNOSTIC, W/WO COLLECTION SPECIMEN BY BRUSH OR  [facility];  Surgeon: Leland Her, MD;  Location: [facility] PROCEDURES [facility];  Service: Gastroenterology   ??? PR PERCUT DRAIN/INJECT RENAL CYST Right 02/21/2021    Procedure: ASPIRATION AND/OR INJECTION OF RENAL CYST OR PELVIS BY NEEDLE, PERCUTANEOUS;  Surgeon: Vickii Penna, MD;  Location: [facility];  Service: Urology   ??? PR PERQ NL/PL LITHOTRP COMPLEX >2 CM MLT LOCATIONS Right 02/21/2021    Procedure: supine standard PCNL; need fortec;  Surgeon: Vickii Penna, MD;  Location: [facility] OR [facility];  Service: Urology   ??? PR PVB THORACIC SINGLE INJECTION SITE W/IMG GID Right 02/21/2021    Procedure: PARAVERTEBRAL BLOCK (PVB) (PARASPINOUS BLOCK), THORACIC; SINGLE INJECTION SITE (INCLUDES IMAGING GUIDANCE, WHEN PERFORMED);  Surgeon: Vickii Penna, MD;  Location: [facility] OR [facility];  Service: Urology   ??? PR UPPER GI ENDOSCOPY,BIOPSY N/A 08/29/2019    Procedure: UGI ENDOSCOPY; WITH BIOPSY, SINGLE OR MULTIPLE;  Surgeon: Leland Her, MD;  Location: [facility];  Service: Gastroenterology   ??? removal for fatty tumors     ??? SINUS SURGERY     ??? SINUS SURGERY     ??? SKIN BIOPSY     ??? urolift         ALLERGIES:    is allergic to glipizide, actos [pioglitazone], crestor [rosuvastatin], esomeprazole magnesium, esomeprazole magnesium, liraglutide, metformin, midazolam, sitagliptin, amoxicillin, atorvastatin, erythromycin base, glimepiride, and oxycodone.    MEDICATIONS:  Current Outpatient Medications   Medication Sig Dispense Refill   ??? alirocumab (PRALUENT PEN) 150 mg/mL subcutaneous injection Inject the contents of one pen (150 mg total) under the skin every fourteen (14) days. 6 mL 3   ??? TRULICITY 0.75 mg/0.5 mL injection pen Inject 0.5 mL (0.75 mg total) under the skin once a week.     ??? acetaminophen (TYLENOL EXTRA STRENGTH) 500 MG tablet Take 1 tablet (500 mg total) by mouth two (2) times a day. May try with 1 aleve for acute pain. 100 tablet 2   ??? albuterol 2.5 mg /3 mL (0.083 %) nebulizer solution Inhale 3 mL (2.5 mg total) by nebulization every six (6) hours as needed for wheezing. 3 mL 1   ??? albuterol HFA 90 mcg/actuation inhaler Inhale 2 puffs every six (6) hours as needed for wheezing. 8 g 3   ??? clobetasoL (TEMOVATE) 0.05 % cream Apply topically Two (2) times a day. 30 g 0   ??? COVID-19 antigen test (COVID-19 AT-HOME TEST) Kit 2 kits by Miscellaneous route daily as needed. 2 kit 1   ??? empty container (SHARPS-A-GATOR DISPOSAL SYSTEM) Misc Use as directed for sharps disposal 1 each 2   ??? fluocinonide (LIDEX) 0.05 % external solution Apply topically Two (2) times a day. 60 mL 1   ??? fluticasone propion-salmeteroL (ADVAIR HFA) 230-21 mcg/actuation inhaler Inhale 2 puffs Two (2) times a day. 12 g 11   ??? fluticasone propionate (FLONASE) 50 mcg/actuation nasal spray 2 sprays into each nostril daily. 16 g 11   ??? folic acid (FOLVITE) 1 MG tablet Take 1 tablet (1 mg total) by mouth.     ??? insulin NPH-insulin regular, 70/30, (HUMULIN/NOVOLIN) 100 unit/mL (70-30) injection Inject 0.16 mL (16 Units total) under the skin Two (2) times a day. 20 mL 3   ??? insulin syr/ndl U100 half mark 0.5 mL 31 gauge x 15/64 (6 mm) Syrg by Miscellaneous route.      ??? ketoconazole (NIZORAL) 2 % shampoo WASH SCALP 3 TIMES A WEEK.  LEAVE IT IN PLACE FOR 30 MINUTES BEFORE RINSING       No current facility-administered medications for this visit.        SOCIAL HISTORY:  Social History     Tobacco Use   ??? Smoking status: Former     Packs/day: 1.00     Years: 20.00     Pack years: 20.00     Types: Cigarettes   ??? Smokeless tobacco: Former     Quit date: 1992   ??? Tobacco comments:     30 years ago   Vaping Use   ??? Vaping Use: Never used   Substance Use Topics   ??? Alcohol use: Not Currently     Comment: rarely    ??? Drug use: No        FAMILY HISTORY:  Positive for kidney stones disease.  Family History   Problem Relation Age of Onset   ??? Cancer Mother         throat cancer   ??? Emphysema Father    ??? Melanoma Neg Hx    ??? Basal cell carcinoma Neg Hx    ??? Squamous cell carcinoma Neg Hx        REVIEW OF SYSTEMS:  A 10-system review of systems was completed  by the patient and reviewed by me today.  All pertinent positives  were discussed in history of present illness.    PHYSICAL EXAMINATION:  VITAL SIGNS:  The patient is afebrile.  Vital signs are stable.  GENERAL:  In no apparent distress.  HEENT:  Eyes, ears and mouth appeared normal.  LYMPHATICS:  No cervical lymphadenopathy.  LUNGS:  Chest wall excursion symmetric bilaterally. No audible wheezing  ABDOMEN:  Soft, nontender, nondistended.  BACK:  Negative CVA tenderness.  EXTREMITIES:  No lower extremity edema.  SKIN:  No rashes or jaundice.  NEUROLOGIC:  No focal deficits on neurologic exam.    LABORATORY TESTING:    100% Uric acid       Chemistry        Component Value Date/Time    NA 142 09/15/2021 0919    NA 137 01/09/2014 0911    K 4.6 09/15/2021 0919    K 4.6 01/09/2014 0911    CL 107 09/15/2021 0919    CL 100 01/09/2014 0911    CO2 28.5 09/15/2021 0919    CO2 28 01/09/2014 0911    BUN 25 (H) 09/15/2021 0919    BUN 10 01/09/2014 0911    CREATININE 0.94 09/15/2021 0919    CREATININE 1.1 01/15/2021 1406    GLU 139 09/15/2021 0919        Component Value Date/Time    CALCIUM 9.3 09/15/2021 0919    CALCIUM 9.4 01/09/2014 0911    ALKPHOS 74 05/12/2021 1325    AST 14 05/12/2021 1325    AST 37 07/20/2014 0916    ALT 28 05/12/2021 1325    ALT 23 07/20/2014 0916    BILITOT 0.4 05/12/2021 1325            IMAGING:  01/15/2021 CT urogram  Impression       1. Bilateral nephrolithiasis and numerous bilateral renal cysts.       2. Mild nonspecific thickening of the urinary bladder which may be related to underdistention, chronic bladder outlet obstruction and/or cystitis. Clinical correlation is advised.       3. Hepatic steatosis.       4. Additional findings, as described above.  04/10/2021 RUS  Impression   -No hydronephrosis.   -Bilateral renal cysts and renal calculi, seen on prior CT.   -Partially visualized echogenic liver suggesting hepatic steatosis.       07/19/2021 CT  Impression   1. Resolution of previously seen right-sided renal stones following percutaneous nephrostolithotomy.   2. Persistent and unchanged left-sided renal stone measuring up to 0.8 cm.   2. Additional chronic and incidental findings as above.     Scribe's Attestation: Macy Mis, MD obtained and performed the history, physical exam and medical decision making elements that were entered into the chart.  Signed by Vergie Living, Scribe, on November 15, 2021 at 11:43 AM.    ----------------------------------------------------------------------------------------------------------------------  November 18, 2021 10:53 AM. Documentation assistance provided by the Scribe. I was present during the time the encounter was recorded. The information recorded by the Scribe was done at my direction and has been reviewed and validated by me.    Macy Mis MD  ----------------------------------------------------------------------------------------------------------------------

## 2021-11-15 NOTE — Unmapped (Signed)
Fluids:  Aim for drinking 2.5 -  3 liters of fluids per day.  This is about the same as 2.5 - 3 quarts or 80 - 100 ounces.      Citrate:  Add citrate, which is a stone inhibitor, by adding lemon or lime juice to beverages or foods.  Calorie-free lemonade, such as Crystal Light, is another way to get citrate.    Sodium:  Keep sodium intake below 2000 milligrams per day.  Read food labels to find out how much sodium is in your usual food choices.    Balance:  Have smaller portions of foods with a high acid load - meat, poultry, fish, cheese, sugar, and refined grains like white bread.  Balance these foods by always including fruits or vegetables at eating times.  Doing this will help to neutralize the acid load of a meal.        See the Kidney Stone Nutrition Therapy handout for more details on these recommendations.

## 2021-11-15 NOTE — Unmapped (Signed)
Endocrinology Follow-up      William Keller is a 81 y.o. 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 For diabetes, he is taking Trulicity 0.75 mg weekly and Humulin 18 units before meals and at bedtime. Discussed doubling his dose of Trulicity, if he can tolerate his current dose. Also mentioned lowering dose of Humulin if blood sugars are <100. He is taking testosterone 1.62% one packet 20-25 mg daily for hypogonadism. Advised patient to follow up with urology regarding hematuria concern in light of bladder cancer history.     Patient self discontinued Humulin about a month ago. He said there was no difference in his blood sugars without the Humulin. Believes his cortisol is low. Weight has been stable.     Referring Provider: Kennith Keller*     Primary Provider: Ericka Pontiff, MD      Assessment/Plan:     Type 2 diabetes mellitus with diabetic polyneuropathy, with long-term current use of insulin (CMS-HCC)  Assessment & Plan:  Patient's type 2 diabetes mellitus is improving. A1c down from 7.5 to 6.5.    Lab Results   Component Value Date    A1C 6.5 (A) 11/16/2021    A1C 7.5 (H) 08/01/2021    A1C 7.9 (H) 05/02/2021    GLU 139 09/15/2021    GLU 131 07/19/2021    GLU 135 05/02/2021      Plan:   -Discontinue Trulicity and begin taking Ozempic 0.5 mg weekly for one month. After a month, increase Ozempic to 1 mg weekly.   -Monitor blood sugars and stay off Humulin for now, but contact office if sugars go above 200.  -Follow up in 3 months.           Orders:  -     POCT glycosylated hemoglobin (Hb A1C)  -     POCT glucose    Hypogonadism  Overview:  Per Dr. Aleen [facility] when he consulted with him 2022:  Endocrine history:  He was started on testosterone by a holistic doctor: testosterone sublingual for a year 27mg /ml, was on this for several years. He has ED, and reports fatigue. He has been researching online and asked about testing for testosterone so this was checked and he was referred to endocrinology at [facility]. 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    Assessment & Plan:  Plan:  -Ask urologist to do a prostate exam and a prostate specific antigen test.           Return in about 3 months (around 02/16/2022).    Patient Instructions   -Ask urologist to do a prostate exam and a prostate specific antigen test.   -Discontinue Trulicity and begin taking Ozempic 0.5 mg weekly for one month. After a month, increase Ozempic to 1 mg weekly.   -Monitor blood sugars and stay off Humulin for now, but contact office if sugars go above 200.  -Follow up in 3 months.       Subjective:     History of Present Illness: See problem oriented charting.    Past Medical History:   Diagnosis Date    Arthritis     Asthma     Cancer (CMS-HCC)     Diabetes mellitus (CMS-HCC)  Hearing impairment     hearing aids    Hyperlipidemia     Kidney stone     Myocardial infarction (CMS-HCC)     Peripheral neuropathy     Visual impairment     cataract sx 05/27/20        Past Surgical History:   Procedure Laterality Date    CATARACT EXTRACTION      CATARACT EXTRACTION, BILATERAL      CHG ULTRASONIC GUIDANCE, INTRAOPERATIVE Right 02/21/2021    Procedure: ULTRASONIC GUIDANCE, INTRAOPERATIVE;  Surgeon: Vickii Penna, MD;  Location: [facility];  Service: Urology    esophaus rap surgery      PR CATH PLACE/CORON ANGIO, IMG SUPER/INTERP,W LEFT HEART VENTRICULOGRAPHY N/A 03/31/2015    Procedure: Left Heart Catheterization;  Surgeon: Joanie Coddington, MD;  Location: [facility] CATH;  Service: Cardiology    PR COLONOSCOPY FLX DX W/COLLJ SPEC WHEN PFRMD N/A 08/29/2019    Procedure: COLONOSCOPY, FLEXIBLE, PROXIMAL TO SPLENIC FLEXURE; DIAGNOSTIC, W/WO COLLECTION SPECIMEN BY BRUSH OR WASH;  Surgeon: Leland Her, MD;  Location: [facility] PROCEDURES [facility];  Service: Gastroenterology    PR PERCUT DRAIN/INJECT RENAL CYST Right 02/21/2021    Procedure: ASPIRATION AND/OR INJECTION OF RENAL CYST OR PELVIS BY NEEDLE, PERCUTANEOUS;  Surgeon: Vickii Penna, MD;  Location: [facility] OR [facility];  Service: Urology    PR PERQ NL/PL LITHOTRP COMPLEX >2 CM MLT LOCATIONS Right 02/21/2021    Procedure: supine standard PCNL; need fortec;  Surgeon: Vickii Penna, MD;  Location[facility];  Service: Urology    PR PVB THORACIC SINGLE INJECTION SITE W/IMG GID Right 02/21/2021    Procedure: PARAVERTEBRAL BLOCK (PVB) (PARASPINOUS BLOCK), THORACIC; SINGLE INJECTION SITE (INCLUDES IMAGING GUIDANCE, WHEN PERFORMED);  Surgeon: Vickii Penna, MD;  Location: [facility];  Service: Urology    PR UPPER GI ENDOSCOPY,BIOPSY N/A 08/29/2019    Procedure: UGI ENDOSCOPY; WITH BIOPSY, SINGLE OR MULTIPLE;  Surgeon: Leland Her, MD;  Location: [facility];  Service: Gastroenterology    removal for fatty tumors      SINUS SURGERY      SINUS SURGERY      SKIN BIOPSY      urolift         Current Outpatient Medications   Medication Sig Dispense Refill    TRULICITY 0.75 mg/0.5 mL injection pen Inject 0.5 mL (0.75 mg total) under the skin once a week.      acetaminophen (TYLENOL EXTRA STRENGTH) 500 MG tablet Take 1 tablet (500 mg total) by mouth two (2) times a day. May try with 1 aleve for acute pain. 100 tablet 2    albuterol 2.5 mg /3 mL (0.083 %) nebulizer solution Inhale 3 mL (2.5 mg total) by nebulization every six (6) hours as needed for wheezing. 3 mL 1    albuterol HFA 90 mcg/actuation inhaler Inhale 2 puffs every six (6) hours as needed for wheezing. 8 g 3    alirocumab (PRALUENT PEN) 150 mg/mL subcutaneous injection Inject the contents of one pen (150 mg total) under the skin every fourteen (14) days. 6 mL 3    clobetasoL (TEMOVATE) 0.05 % cream Apply topically Two (2) times a day. 30 g 0    COVID-19 antigen test (COVID-19 AT-HOME TEST) Kit 2 kits by Miscellaneous route daily as needed. 2 kit 1    empty container (SHARPS-A-GATOR DISPOSAL SYSTEM) Misc Use as directed for sharps disposal 1 each  2    fluocinonide (LIDEX) 0.05 % external solution Apply topically Two (2) times a day. 60 mL 1    fluticasone propion-salmeteroL (ADVAIR HFA) 230-21 mcg/actuation inhaler Inhale 2 puffs Two (2) times a day. 12 g 11    fluticasone propionate (FLONASE) 50 mcg/actuation nasal spray 2 sprays into each nostril daily. 16 g 11    folic acid (FOLVITE) 1 MG tablet Take 1 tablet (1 mg total) by mouth.      insulin NPH-insulin regular, 70/30, (HUMULIN/NOVOLIN) 100 unit/mL (70-30) injection Inject 0.16 mL (16 Units total) under the skin Two (2) times a day. (Patient not taking: Reported on 11/16/2021) 20 mL 3    insulin syr/ndl U100 half mark 0.5 mL 31 gauge x 15/64 (6 mm) Syrg by Miscellaneous route.       ketoconazole (NIZORAL) 2 % shampoo WASH SCALP 3 TIMES A WEEK. LEAVE IT IN PLACE FOR 30 MINUTES BEFORE RINSING       No current facility-administered medications for this visit.        Allergies   Allergen Reactions    Glipizide Shortness Of Breath    Actos [Pioglitazone]      Pt states it gave him a heart attack    Crestor [Rosuvastatin] Other (See Comments)     indigestion    Esomeprazole Magnesium Other (See Comments)     Makes patients heart burn worse    Esomeprazole Magnesium Other (See Comments)     Makes patients heart burn worse  Other reaction(s): Other (See Comments)  Makes patients heart burn worse  Makes patients heart burn worse  Makes patients heart burn worse      Liraglutide Other (See Comments)     bloating    Metformin      Hospitalized for shortness of breath/asthma.      Midazolam Other (See Comments)     Doesn't do well with after effects     Sitagliptin      Other reaction(s): JOINT PAIN    Amoxicillin Nausea Only     Other reaction(s): NAUSEA    Atorvastatin      indigestion    Erythromycin Base Nausea Only     Other reaction(s): OTHER    Glimepiride Rash     Other reaction(s): RASH    Oxycodone Nausea Only     Pt states he was severely and acutely nauseated and bloated        Family History   Problem Relation Age of Onset    Cancer Mother         throat cancer    Emphysema Father     Melanoma Neg Hx     Basal cell carcinoma Neg Hx     Squamous cell carcinoma Neg Hx            Review of Systems -  negative except as noted in HPI.     Objective:     Physical Exam:  BP 130/72  - Pulse 67  - Resp 16  - Wt 98 kg (216 lb)  - SpO2 93%  - BMI 31.90 kg/m??   General appearance - alert, well appearing, and in no distress.  Eyes - No lid lag or stare, no proptosis, EOM's intact.  Mouth - mucous membranes moist.  Neck - supple, thyroid normal in size without nodules or tenderness.  Lymphatics - no cervical or supraclavicular adenopathy appreciated.  Chest - clear, good excursion.  Heart - normal rate, regular rhythm.  Abdomen - non-distended,  soft.   Neurological - no hand tremors. Normal gait.  Extremities - extremities warm. No lower extremity edema.  Skin - warm, dry, no visible rashes.  Psych - Normal mood, appropriate affect.  Muskuloskeletal - No kyphosis or spine tenderness.      Lab Review:  I've reviewed the patient's most recent pertinent labs in the electronic record and provided records.  Lab Results   Component Value Date    TSH 0.804 05/02/2021    FREET4 1.10 05/02/2021         Lab Results   Component Value Date    TSH 0.804 05/02/2021    TSH 1.209 06/25/2019    TSH 0.527 (L) 06/22/2017    FREET4 1.10 05/02/2021    FREET4 0.77 06/22/2017       Lab Results   Component Value Date    CALCIUM 9.3 09/15/2021    CALCIUM 9.6 07/19/2021    CALCIUM 9.7 05/02/2021    ALBUMIN 3.9 05/12/2021    CREATININE 0.94 09/15/2021       Lab Results   Component Value Date    A1C 6.5 (A) 11/16/2021    GLU 139 09/15/2021    CREATININE 0.94 09/15/2021    ALBCRERAT 77.0 (H) 12/27/2020    ALBQTUR 5.0 12/27/2020    CHOL 124 09/12/2021    HDL 44 09/12/2021    NONHDL 80 09/12/2021    LDL 35 (L) 09/12/2021    TRIG 226 (H) 09/12/2021    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     No results found for this or any previous visit.     No results found for this or any previous visit.       DEXA:    Results for orders placed during the hospital encounter of 09/28/20    XR Dexa Bone Density Skeletal    Narrative  EXAM: QDR BODY NON-EXTREMITIES  DATE: 09/28/2020 8:46 AM  ACCESSION: [id number]  DICTATED: 09/28/2020 3:37 PM  INTERPRETATION LOCATION: [facility]    CLINICAL INDICATION: 81 years old Male with osteopenia    TECHNIQUE: Dual energy x-ray absorptiometry was performed assessing the bone mineral density in the lumbar spine and proximal left femur using a Hologic Horizon A densitometer.    FINDINGS: The bone mineral density in the spine measuring L1 to 4 measures 1.290 gm/cm2.  The  Z score is 2.9 and the T score is 1.8.    The total bone mineral density in the proximal left femur measures 1.283 gm/cm2.  The Z score is 2.6 and the T score is 1.7.  The femoral neck density is 0.951 gm/cm2, and the T score is 0.2.    Impression  Lumbar spine: Normal bone density    Left proximal femur: Normal bone density             Other Medical Data :       I've personally reviewed and summarized records in EPIC/Media and via CareEverywhere as well as medication, allergies, past medical, social, and family history.     I attest that I, Mamie Nick, personally documented this note while acting as scribe for Marvia Pickles, MD.      Mamie Nick, Scribe.  11/16/2021     The documentation recorded by the scribe accurately reflects the service I personally performed and the decisions made by  me.    Marvia Pickles, MD

## 2021-11-16 ENCOUNTER — Ambulatory Visit: Admit: 2021-11-16 | Discharge: 2021-11-17 | Payer: MEDICARE | Attending: "Endocrinology | Primary: "Endocrinology

## 2021-11-16 DIAGNOSIS — N500 Atrophy of testis: Principal | ICD-10-CM

## 2021-11-16 DIAGNOSIS — Z794 Long term (current) use of insulin: Principal | ICD-10-CM

## 2021-11-16 DIAGNOSIS — E1142 Type 2 diabetes mellitus with diabetic polyneuropathy: Principal | ICD-10-CM

## 2021-11-16 NOTE — Unmapped (Addendum)
-  Ask urologist to do a prostate exam and a prostate specific antigen test.   -Discontinue Trulicity and begin taking Ozempic 0.5 mg weekly for one month. After a month, increase Ozempic to 1 mg weekly.   -Monitor blood sugars and stay off Humulin for now, but contact office if sugars go above 200.  -Follow up in 3 months.

## 2021-11-21 NOTE — Unmapped (Signed)
Plan:  -Ask urologist to do a prostate exam and a prostate specific antigen test.

## 2021-11-21 NOTE — Unmapped (Signed)
Pt brought in Social Security Benefit Statements for his wife and himself.   Per pt, Brooke in Endo requested them for a new medication.  Forms have been scanned into patient's chart and encounter routed to [facility].  Thanks.

## 2021-11-21 NOTE — Unmapped (Signed)
Patient's type 2 diabetes mellitus is improving. A1c down from 7.5 to 6.5.    Lab Results   Component Value Date    A1C 6.5 (A) 11/16/2021    A1C 7.5 (H) 08/01/2021    A1C 7.9 (H) 05/02/2021    GLU 139 09/15/2021    GLU 131 07/19/2021    GLU 135 05/02/2021      Plan:   -Discontinue Trulicity and begin taking Ozempic 0.5 mg weekly for one month. After a month, increase Ozempic to 1 mg weekly.   -Monitor blood sugars and stay off Humulin for now, but contact office if sugars go above 200.  -Follow up in 3 months.

## 2021-11-22 NOTE — Unmapped (Signed)
Called [name] and left patient message to call the Urology Clinic back to schedule the below appt(s):    Provider: [facility]   Type of Appt: Return to [facility] clinic in 8 to 10 months with a repeat renal ultrasound prior with nutrition, nephrology and urology   Radiology: renal ultrasound and x-ray       Asked the patient to call back at [phone].    Thank you,    Nobie Putnam  Urology Clinic  [phone]

## 2021-12-02 NOTE — Unmapped (Signed)
Patient assistance application faxed to [employer]. No further concerns at this moment.

## 2021-12-16 NOTE — Unmapped (Signed)
Received fax confirmation patient was approved for patient assistance program. Attempted to contact patient. No answer. Left voicemail informing patient he was approved. No further concerns at this moment.

## 2021-12-22 NOTE — Unmapped (Addendum)
Try hard/ sour candies and massaging the parotid gland    If you develop pain, increased swelling then call me for an ENT referral    Take ozempic 0.25 mg weekly    Take miralax/ polyethylene glycol 1 capful mixed in 8 ounces of water once daily for constipation    I recommend the Shingrix (Zoster vaccine) which is a 2 part series to help prevent Shingles.  The 2nd vaccine you will receive 2-6 months after the first vaccine.  You can receive this at your local drugstore but call to make sure they have it available and you can ask about the co-pay.  1 in 5 people can have a fever or flu-like symptoms with the vaccine.    Return in 7-8 months      If you have any questions, call the [facility] at [phone] during regular business hours, or send me a message through MyChart. Please allow 2 business days for responses to [employer]. For urgent concerns after hours and weekends, please call the [facility] at [phone].    Kelli Churn, MD

## 2021-12-22 NOTE — Unmapped (Signed)
[facility] Return Visit    Assessment/Plan:     William [facility] is a 81 y.o. male presenting to [facility] for follow up.    DM2 complicated by peripheral neuropathy, now at goal: ??Goal A1c <??7.5%, ??Has not tolerated Metformin, Pioglitazone and not good candidate for SGLT2 given hx of UTIs.  He is now off insulin and on Trulicity 0.75 mg weekly ** and he is off his cortef.  He will get his retinal eye exam with outside provider (he mentioned he might have early cataracts)  ??  COPD GOLD B/ asthma overlap: ??50 pack year smoking history, quit 1992. Fortunately, he is now off cortef and on Advair inhaler.   ??  Long term use of glucocorticoid therapy: Remote history of holistic doctor starting him on Cortef for COPD and now off . DEXA scan in 08/2020, normal bone density.    ??  CAD: Hx of RCA stent. He is now on ASA 81 mg daily. Did not tolerate statins/Zetia and now on Repatha.    ??MSK pain in right lateral back 2/2 degenerative changes, est with spine center, did not want PT, they ordered an MRI and if worsening will consider facet injection    Nephrolithiasis: He is s/p percutaneous nephrostolithotomy with stone extractoin  ??  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  Hx of bladder cancer,??s/p transuretheral resection in 2018    Primary hypogonadism: ??On testosterone managed by endocrinology  ??  GERD: s/p fundiplication and continues OTC antacids and omeprazole.   ??  HCM: Discuss Shingrix in follow up. *** Colonoscopy normal 2021. Hearing testing completed and has hearing aids.   ??  ACP: FULL code, but does not want prolonged life support.  Surrogate decision maker is his wife Alif Klaphake  No orders of the defined types were placed in this encounter.        RTC: No follow-ups on file.    ------------------------------------------------------------------------------------------------------------  Subjective     HPI: William Keller is a 81 y.o. male who [facility] to [facility] for follow up.  Please see HPI as per A&P above.  He comes to the visit today with his wife Hemingway Mcrill.       ROS: Pertinent positives and negatives are documented as per the HPI.    I have reviewed and (if needed) updated the patient's problem list, medications, allergies, past medical and surgical history, social and family history in the EMR.    Objective     Physical Exam   Vitals:   There were no vitals filed for this visit.  Wt Readings from Last 3 Encounters:   11/16/21 98 kg (216 lb)   11/15/21 98.9 kg (218 lb)   11/15/21 99.2 kg (218 lb 9.6 oz)     Gen: tired but in no distress  Eyes: conjunctiva clear, no lower lid pallor  Resp: Normal WOB, CTAB and no adventitious sounds  CV: RRR, no murmurs or gallops, distal pulses 2+ symmetric, 1+ pitting edema at ankles  GI: ND, normoactive BS, soft and no tenderness.   MSK: pain to palpation of right lower back in the muscular area.  No spinal tenderness.      PHQ-2 Score:         Labs and Studies in EMR and Reviewed    I personally spent *** minutes face-to-face and non-face-to-face in the care of this patient, which includes all pre, intra, and post  visit time on the date of service.  All documented time was specific to the E/M visit and does not include any procedures that may have been performed. lb)   02/09/21 (!) 101.5 kg (223 lb 12.8 oz)   01/27/21 (!) 103.3 kg (227 lb 12.8 oz)     Gen: tired but in no distress  Face: left parotid gland with enlargement but no pain and no redness  Eyes: conjunctiva clear, no lower lid pallor  Resp: Normal WOB, CTAB and no adventitious sounds  CV: RRR, no murmurs or gallops, distal pulses 2+ symmetric, no edema  GI: ND, normoactive BS, soft and no tenderness mild bulge in central abdomen likely rectus diathesis      PHQ-2 Score:         Labs and Studies in EMR and Reviewed    I personally spent 35 minutes face-to-face and non-face-to-face in the care of this patient, which includes all pre, intra, and post visit time on the date of service.  All documented time was specific to the E/M visit and does not include any procedures that may have been performed.

## 2021-12-23 ENCOUNTER — Ambulatory Visit: Admit: 2021-12-23 | Discharge: 2021-12-24 | Payer: MEDICARE | Attending: Internal Medicine | Primary: Internal Medicine

## 2021-12-23 DIAGNOSIS — E669 Obesity, unspecified: Principal | ICD-10-CM

## 2021-12-23 DIAGNOSIS — E119 Type 2 diabetes mellitus without complications: Principal | ICD-10-CM

## 2021-12-23 DIAGNOSIS — N401 Enlarged prostate with lower urinary tract symptoms: Principal | ICD-10-CM

## 2021-12-23 DIAGNOSIS — E236 Other disorders of pituitary gland: Principal | ICD-10-CM

## 2021-12-23 DIAGNOSIS — J4540 Moderate persistent asthma, uncomplicated: Principal | ICD-10-CM

## 2021-12-23 DIAGNOSIS — Z6831 Body mass index (BMI) 31.0-31.9, adult: Principal | ICD-10-CM

## 2021-12-23 DIAGNOSIS — I2510 Atherosclerotic heart disease of native coronary artery without angina pectoris: Principal | ICD-10-CM

## 2021-12-23 DIAGNOSIS — H903 Sensorineural hearing loss, bilateral: Principal | ICD-10-CM

## 2021-12-23 DIAGNOSIS — Z9181 History of falling: Principal | ICD-10-CM

## 2021-12-23 DIAGNOSIS — Z794 Long term (current) use of insulin: Principal | ICD-10-CM

## 2021-12-23 DIAGNOSIS — E1142 Type 2 diabetes mellitus with diabetic polyneuropathy: Principal | ICD-10-CM

## 2021-12-23 DIAGNOSIS — R350 Frequency of micturition: Principal | ICD-10-CM

## 2021-12-23 DIAGNOSIS — J410 Simple chronic bronchitis: Principal | ICD-10-CM

## 2021-12-23 MED ORDER — SHINGRIX (PF) 50 MCG/0.5 ML INTRAMUSCULAR SUSPENSION, KIT
Freq: Once | INTRAMUSCULAR | 1 refills | 1 days | Status: CP
Start: 2021-12-23 — End: 2021-12-24
  Filled 2021-12-23: qty 0.5, 1d supply, fill #0

## 2021-12-28 NOTE — Unmapped (Signed)
Received 4 boxes of Ozempic from patient assistance program. Medications placed in refrigerator and patient notified rxs are ready to be picked up. Front desk notified that patient should be picking up prescriptions today. Form indicating patient picked up prescription should be signed by patient and scanned.

## 2022-01-05 NOTE — Unmapped (Unsigned)
[facility]  Physical Medicine and Rehabilitation     Patient Name:William Keller  MRN: [id number]  DOB: Oct 15, 1940  Age: 81 y.o.     ----------------------------------------------------------------------------------------------------------------------  January 12, 2022 6:00 PM. Documentation assistance provided by Ferol Luz, medical scribe, at the direction of Valeda Malm, MD.  ----------------------------------------------------------------------------------------------------------------------    ASSESSMENT & PLAN:     01/05/22     DIAGNOSIS:  Lumbar stenosis, facet mediated pain.    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    Was referred to physical [facility] at the [facility], patient wants to stop going as he can do exercises at home. Printed out exercises.    Will order MRI as you have been doing PT over 6wks without relief.    You may continue to take tylenol  naproxen for pain as recent liver/renal labs are okay.    NEXT STEPS/FOLLOW UP: 10 weeks    Will consider facet injection if symptoms worsen or fail to improve      SUBJECTIVE:     Chief Complaint:      Back/neck pain  01/12/22:  Pt presents in follow up after ordering a lumbar MRI and receiving HEP. Today pt reports ***    10/27/21:   Pt presents in f/u after providing a referral to Physical Therapy at the spine center and recommending continuing Tylenol as needed for pain. Today pt reports worsening back pain over the right low back. There is no radiation. 4/10 pain. Worse in mornings, better with movement. Worse bending and riding tractor.  Does note leg cramps and worse pain standing upright.  Feels unsteady.    Meds:  naproxen    History of Present Illness:   William Keller is a 81 y.o. year old male being evaluated in consultation at the request of [name], MD for back/neck pain.  I have reviewed the referring provider's notes:  Chronic upper and low back pain, hx L5/S1 ESI.    Currently pt reports neck>back pain. He endorses stiffness and cracking in neck. No radiation down arms. Denies PT or chiropractor. No longer on gabapentin. Denies falls, bowel/bladder incontinence.    Symptom Location: low back, upper back  Symptom Character: stiffness  Symptom Onset/Mechanism: chronic (>/= 3 months)  Temporal Pattern: constant and getting worse  NRS Pain Intensity: 5  Aggravating Factors: excercise, lying down  Alleviating Factors: sitting  Night Pain: no  Unintended Weight Loss: no  Fever/Infection:no  Neuromotor Function: motor weakness - (no),  gait/coordination disturbance - (no), loss of bowel or bladder control - (no), saddle anesthesia - (no)     Prior Interventions/Modalities:  - tylenol  - L5/S1 ESI  - No PT, chiro    Meds:  - Gabapentin, neg side effects  - Lyrica  - Norco, past    Home Exercise Program:  none      Prior Diagnostics:  CT Urogram 01/15/21  I independently reviewed these images today.  BONES AND SOFT TISSUES: Degenerative changes of the spine. Postoperative changes of the left hemithorax with lateral herniation of the lung with adjacent scarring. This was present on the prior exam. Nonspecific subcutaneous stranding of the left ventral pelvic body wall (series 1#95).    MRI Lumbar Spine 09/30/20  IMPRESSION:   - Multilevel degenerative change in the lumbar spine. Negative for   fracture   - Mild to moderate spinal stenosis L2-3. Severe subarticular stenosis  on the left with left L3 nerve root impingement   - Mild spinal stenosis L3-4 with mild to moderate subarticular   stenosis bilaterally   - Moderate subarticular and foraminal stenosis bilaterally L4-5   - Moderate subarticular stenosis bilaterally L5-S1     MRI Thoracic Spine wo Contrast 05/18/19  IMPRESSION:   Mild thoracic degenerative disc disease without spinal canal or   neural foraminal stenosis.    XR Lumbar Spine 03/25/19  Xrays revealed significant L5-S1 degenerative disc space narrowing with osteophyte formation.  There is mild to moderate L4-L5 disc base narrowing but no significant osteophyte formation.  There is no abnormal curvature.  The pelvis is intact with no hip joint abnormality lesion.      XR Cervical Spine 03/19/18   Xrays revealed a high level cervical degenerative disc changes with C4-C5, C5-6, C6-C7, and C7-T1 degenerative disc changes with osteophyte formation and disc space narrowing.  There is no subluxation or abnormal curvature.     MRI Cervical Spine 03/07/11  I independently reviewed these images today.  IMPRESSION:   Multilevel degenerative changes as described above.      Current Outpatient Medications   Medication Sig Dispense Refill    albuterol 2.5 mg /3 mL (0.083 %) nebulizer solution Inhale 3 mL (2.5 mg total) by nebulization every six (6) hours as needed for wheezing. (Patient not taking: Reported on 12/23/2021) 3 mL 1    albuterol HFA 90 mcg/actuation inhaler Inhale 2 puffs every six (6) hours as needed for wheezing. 8 g 3    alirocumab (PRALUENT PEN) 150 mg/mL subcutaneous injection Inject the contents of one pen (150 mg total) under the skin every fourteen (14) days. 6 mL 3    clobetasoL (TEMOVATE) 0.05 % cream Apply topically Two (2) times a day. 30 g 0    empty container (SHARPS-A-GATOR DISPOSAL SYSTEM) Misc Use as directed for sharps disposal 1 each 2    fluocinonide (LIDEX) 0.05 % external solution Apply topically Two (2) times a day. 60 mL 1    fluticasone propion-salmeteroL (ADVAIR HFA) 230-21 mcg/actuation inhaler Inhale 2 puffs Two (2) times a day. 12 g 11    fluticasone propionate (FLONASE) 50 mcg/actuation nasal spray 2 sprays into each nostril daily. 16 g 11    folic acid (FOLVITE) 1 MG tablet Take 1 tablet (1 mg total) by mouth.      insulin NPH-insulin regular, 70/30, (HUMULIN/NOVOLIN) 100 unit/mL (70-30) injection Inject 0.17 mL (17 Units total) under the skin two (2) times a day as needed for high blood sugar. He is taking 17 units for BS > 200 per endocrinology 20 mL 3    insulin syr/ndl U100 half mark 0.5 mL 31 gauge x 15/64 (6 mm) Syrg by Miscellaneous route.       ketoconazole (NIZORAL) 2 % shampoo WASH SCALP 3 TIMES A WEEK. LEAVE IT IN PLACE FOR 30 MINUTES BEFORE RINSING      semaglutide (OZEMPIC) 0.25 mg or 0.5 mg (2 mg/3 mL) PnIj Inject 0.25 mg under the skin once a week.  0     No current facility-administered medications for this visit.       Allergies:   Glipizide, Actos [pioglitazone], Crestor [rosuvastatin], Esomeprazole magnesium, Esomeprazole magnesium, Liraglutide, Metformin, Midazolam, Sitagliptin, Amoxicillin, Atorvastatin, Erythromycin base, Glimepiride, and Oxycodone    Past Medical / Surgical History:     Past Medical History:   Diagnosis Date    Arthritis     Asthma     Cancer (CMS-HCC)  Diabetes mellitus (CMS-HCC)     Hearing impairment     hearing aids    Hyperlipidemia     Kidney stone     Long term current use of systemic steroids 06/18/2018    Myocardial infarction (CMS-HCC)     Peripheral neuropathy     Risk for falls 12/23/2021    Visual impairment     cataract sx 05/27/20       Past Surgical History:   Procedure Laterality Date    CATARACT EXTRACTION      CATARACT EXTRACTION, BILATERAL      CHG ULTRASONIC GUIDANCE, INTRAOPERATIVE Right 02/21/2021    Procedure: ULTRASONIC GUIDANCE, INTRAOPERATIVE;  Surgeon: Vickii Penna, MD;  Location: [facility] OR [facility];  Service: Urology    esophaus rap surgery      PR CATH PLACE/CORON ANGIO, IMG SUPER/INTERP,W LEFT HEART VENTRICULOGRAPHY N/A 03/31/2015    Procedure: Left Heart Catheterization;  Surgeon: Joanie Coddington, MD;  Location: [facility] CATH;  Service: Cardiology    PR COLONOSCOPY FLX DX W/COLLJ SPEC WHEN PFRMD N/A 08/29/2019    Procedure: COLONOSCOPY, FLEXIBLE, PROXIMAL TO SPLENIC FLEXURE; DIAGNOSTIC, W/WO COLLECTION SPECIMEN BY BRUSH OR WASH;  Surgeon: Leland Her, MD;  Location: [facility] PROCEDURES [facility];  Service: Gastroenterology PR PERCUT DRAIN/INJECT RENAL CYST Right 02/21/2021    Procedure: ASPIRATION AND/OR INJECTION OF RENAL CYST OR PELVIS BY NEEDLE, PERCUTANEOUS;  Surgeon: Vickii Penna, MD;  Location: [facility] OR [facility];  Service: Urology    PR PERQ NL/PL LITHOTRP COMPLEX >2 CM MLT LOCATIONS Right 02/21/2021    Procedure: supine standard PCNL; need fortec;  Surgeon: Vickii Penna, MD;  Location: [facility] OR [facility];  Service: Urology    PR PVB THORACIC SINGLE INJECTION SITE W/IMG GID Right 02/21/2021    Procedure: PARAVERTEBRAL BLOCK (PVB) (PARASPINOUS BLOCK), THORACIC; SINGLE INJECTION SITE (INCLUDES IMAGING GUIDANCE, WHEN PERFORMED);  Surgeon: Vickii Penna, MD;  Location: [facility] OR [facility]  Service: Urology    PR UPPER GI ENDOSCOPY,BIOPSY N/A 08/29/2019    Procedure: UGI ENDOSCOPY; WITH BIOPSY, SINGLE OR MULTIPLE;  Surgeon: Leland Her, MD;  Location: [facility] PROCEDURES [facility];  Service: Gastroenterology    removal for fatty tumors      SINUS SURGERY      SINUS SURGERY      SKIN BIOPSY      urolift         Social History     Socioeconomic History    Marital status: Married   Tobacco Use    Smoking status: Former     Packs/day: 1.00     Years: 20.00     Additional pack years: 0.00     Total pack years: 20.00     Types: Cigarettes    Smokeless tobacco: Former     Quit date: 1992    Tobacco comments:     30 years ago   Advertising account planner    Vaping Use: Never used   Substance and Sexual Activity    Alcohol use: Not Currently     Comment: rarely     Drug use: No    Sexual activity: Not Currently     Partners: Female   Other Topics Concern    Do you use sunscreen? No    Tanning bed use? Yes    Are you easily burned? No    Excessive sun exposure? Yes    Blistering sunburns? Yes    Exercise Yes  Comment: work day to day    Living Situation No     Social Determinants of Health     Financial Resource Strain: Low Risk  (05/26/2020)    Overall Financial Resource Strain (CARDIA)     Difficulty of Paying Living Expenses: Not hard at all   Food Insecurity: No Food Insecurity (05/26/2020)    Hunger Vital Sign     Worried About Running Out of Food in the Last Year: Never true     Ran Out of Food in the Last Year: Never true   Transportation Needs: No Transportation Needs (12/23/2021)    PRAPARE - Therapist, art (Medical): No     Lack of Transportation (Non-Medical): No       Family History   Problem Relation Age of Onset    Cancer Mother         throat cancer    Emphysema Father     Melanoma Neg Hx     Basal cell carcinoma Neg Hx     Squamous cell carcinoma Neg Hx        For any of the above entries which indicate no records on file, the patient reports no relevant history.                 Review of Systems:   Review of Systems was completed through a 10 organ system review and is listed in the chart.  Pertinent positives are noted in HPI or flowsheet and otherwise negative.  Patient has been instructed to followup with PCP or appropriate specialist for symptoms outside the purview of this speciality.    OBJECTIVE:     Vitals:     There were no vitals filed for this visit.        The above medications, allergies, history, and ROS, and vitals have been reviewed.    Physical Exam:   GEN: alert and oriented, no apparent distress  HEENT: normocephalic, atraumatic, anicteric, moist mucous membranes  CV: normal heart rate  PULM: normal work of breathing  GI: nondistended  EXT: no swelling, edema in b/l UE and LE  SKIN: no visible ecchymosis or breakdown  PSYCH: normal mood and affect    NEURO:   Manual Muscle Testing  5/5 in lower limbs    2+ patella  None achilles  Equivocal babinski with no clonus.    No hoffmans  Reduced upper limb reflexes    ----------------------------------------------------------------------------------------------------------------------  January 12, 2022 6:00 PM. Documentation assistance provided by Ferol Luz, medical scribe, at the direction of Valeda Malm, MD.  ----------------------------------------------------------------------------------------------------------------------      Tia Masker, MD  Assistant Professor - PM&R  Musculoskeletal and Spine Specialist - [facility] [employer]

## 2022-01-10 ENCOUNTER — Ambulatory Visit: Payer: Medicare HMO | Admitting: Urology

## 2022-01-10 ENCOUNTER — Encounter: Payer: Self-pay | Admitting: Urology

## 2022-01-10 VITALS — BP 149/77 | HR 73 | Ht 70.0 in | Wt 220.0 lb

## 2022-01-10 DIAGNOSIS — Z8551 Personal history of malignant neoplasm of bladder: Secondary | ICD-10-CM | POA: Diagnosis not present

## 2022-01-10 LAB — URINALYSIS, COMPLETE
Bilirubin, UA: NEGATIVE
Glucose, UA: NEGATIVE
Ketones, UA: NEGATIVE
Nitrite, UA: NEGATIVE
Protein,UA: NEGATIVE
RBC, UA: NEGATIVE
Specific Gravity, UA: 1.025 (ref 1.005–1.030)
Urobilinogen, Ur: 0.2 mg/dL (ref 0.2–1.0)
pH, UA: 5 (ref 5.0–7.5)

## 2022-01-10 LAB — MICROSCOPIC EXAMINATION: WBC, UA: >30 /hpf — AB (ref 0–5)

## 2022-01-10 NOTE — Progress Notes (Signed)
    01/10/22  CC:  Chief Complaint  Patient presents with   Cysto    HPI: Dustin Blackburn is a 81 y.o. male  who returns today for cysto/stent removal s/p cystoscopy w/ bladder bx, Urolift, ureteroscopy/lithotripsy, and remote history of bladder cancer who presents today for surveillance cystoscopy.   He was initially found to have incidental bladder wall thickening on CT abdomen pelvis on 03/2016. Cystoscopy showed a papillary proximal and 1 cm tumor at the dome of the bladder. He was taken to the operating room on 06/12/2016 for TURBT, bilateral retrograde, and instillation of mitomycin.   Surgical pathology was consistent with low-grade, noninvasive tumor.   He returned to the operating room on 10/02/2017 for cystoscopy, bladder biopsy, bilateral retrograde pyelograms secondary to bladder erythema.    Surgical pathology was benign with evidence of squamous metaplasia and reactive changes, scarlike fibrosis, but no evidence of malignancy or dysplasia.   He underwent another bladder biopsy for bladder erythema in 10/2019 at the time of ureteroscopy.  This was consistent with mucosal edema and mixed inflammation but no malignancy.   Notably, he also has a personal history of kidney stones.  Last year, he transitioned his urologic care to [facility].  For what ever reason, he returns today for surveillance cystoscopy here which he would like to continue in [place].  He also has a personal history of BPH status post UroLift.    Vitals:   01/10/22 1023  BP: (!) 149/77  Pulse: 73   NED. A&Ox3.   No respiratory distress   Abd soft, NT, ND Normal phallus with bilateral descended testicles  Cystoscopy Procedure Note  Patient identification was confirmed, informed consent was obtained, and patient was prepped using Betadine solution.  Lidocaine jelly was administered per urethral meatus.     Pre-Procedure: - Inspection reveals a normal caliber ureteral meatus.  Procedure: The flexible  cystoscope was introduced without difficulty - No urethral strictures/lesions are present. - Enlarged prostate bilobar coaptation  - Elevated bladder neck - Bilateral ureteral orifices identified - Bladder mucosa unremarkable today with a few stellate scars - No bladder stones - No trabeculation  Retroflexion shows NED   Post-Procedure: - Patient tolerated the procedure well   Assessment/ Plan:  1.  Bladder cancer - No evidence of disease today, will continue to follow on annual basis with cystoscopy   2. Kidney stone - Followed by [facility] - Continues to have flank pain likely unrelated to stone disease, defer to above  F/u annual cysto  Conley Rolls as a scribe for Hollice Espy, MD.,have documented all relevant documentation on the behalf of Hollice Espy, MD,as directed by  Hollice Espy, MD while in the presence of Hollice Espy, MD.  I have reviewed the above documentation for accuracy and completeness, and I agree with the above.   Hollice Espy, MD

## 2022-01-17 NOTE — Unmapped (Addendum)
Patient brought in Ozempic that he received from a patient assistance program and stated that he can no longer take this medication because he is having adverse effects from it, he is having nausea and now has a knot on his jaw that he says came from the medication. His PCP also recommended he decrease the medication to .25 which he did and the symptoms did not subside so he stopped taking it altogether. Please advise. He is not scheduled to see Dr. Amalia Hailey until October. Medication disposed by clinical staff. Thanks

## 2022-01-18 NOTE — Unmapped (Signed)
Done, sorry

## 2022-01-18 NOTE — Unmapped (Signed)
Contacted patient. Confirmed name and DOB. Patient reports that the same jaw knot and nausea happened the last time he took ozempic when his pcp prescribed it to him. Reports he saw his PCP last time and was told it is an inflamed salivary gland and reports the swelling and nausea resolved within 1 week of stopping ozempic last time. Also states that he is still experiencing nausea, especially in the AMs.    Patient already dropped off ozempic samples to [facility]. Instructed patient to continue to monitor his jaw and nausea symptoms as well as his blood sugars and to contact our office if he notices his blood sugars elevated in order to move up his next follow up. Patient verbalized understanding and agrees to plan of care. No further concerns at this time.

## 2022-01-31 NOTE — Unmapped (Signed)
01/31/22 Pt said he is no long taking this medication. sed

## 2022-02-28 NOTE — Unmapped (Unsigned)
Endocrinology Follow-up      William Keller is a 81 y.o. male seen in Endocrinology consultation follow-up on 03/02/22 for Follow-up and Diabetes    Last seen by me 11/16/2021 for diabetes with polyneuropathy and hypogonadism. His last A1C 6.5 11/16/21, discontinued Trulicity and started Ozempic 0.25 mg weekly x 1 month. Patient self-stopped Humulin, and was advised at last visit to restart if home BS >200.  Patient reported he was unable to take Ozempic 2/2 SE (stomach pain and nausea x days) so he has stopped this. He tried to take the medication twice, but both times he felt unwell for days following injection. Currently on Humulin 70/30 17 U BID, taking before bed and in the morning. Patient's A1C today up to 7.6 (up from 6.5 last visit). No episodes of hypoglycemia. Fasting BS running 140-180, evening BS running around 200.     Trulicity not affordable (donut hole of insurance coverage. He's interested in [place]. He has history of UTI (last one was 1 year ago).     Referring Provider: [name]*     Primary Provider: Conchita Paris, MD    Assessment/Plan:     Type 2 diabetes mellitus with diabetic polyneuropathy, with long-term current use of insulin (CMS-HCC)  Overview:  Date of first consultation: 09/15/21  Presented with: HbA1c   Age of diagnosis:   Family history:  Past medications: Trulicity (stopped 2/2 cost, but was working well) Ozempic not tolerated (nausea) multiple allergies including metformin, sulfonylureas and DPP-4 inhibitors. Reports metformin caused SOB leading to hospitalization in the past. Patient also has financial barrier to care.   Current regimen: Humulin 70/30 17 U BID  Insulin use: Y  Complications:   Experiences hyper/hypoglycemia:   Hx DKA/hyperosmolar coma:  Hx hypoglycemic emergency:  Hypoglycemic awareness:   Exercise:   Diet:  Changes made: Increase Humulin 70/30 to 18 U BID, starting trial Jardiance 10 mg daily.     Assessment & Plan:  Type 2 Diabetes Mellitus    Assessment: Diabetes is not well controlled with current medication regimen and lifestyle. At last OV, started trial Ozempic 0.25 mg weekly. Patient did not tolerate this, so he restarted Humulin 17 U BID. His A1C increased from 6.5 to 7.6 today.     *The patient requires a therapeutic CGM.   *The patient has diabetes mellitus and is insulin-treated with 3 or more daily administrations of insulin(MDII) or a continuous subcutaneous insulin infusion (CSII) pump.   *The patient???s insulin treatment regimen requires frequent adjustments by the patient on the basis of therapeutic CGM testing results.   *Within the last six months prior to ordering the CGM, I had an in-person visit with the beneficiary to evaluate their diabetes control and determine that the above criteria are met.   *Every six months following the initial prescription of the CGM, I will have an in-person visit with the beneficiary to assess adherence to their CGM regimen and diabetes treatment plan.          Lab Results   Component Value Date    A1C 7.6 (A) 03/02/2022    A1C 6.5 (A) 11/16/2021    A1C 7.5 (H) 08/01/2021      Wt Readings from Last 3 Encounters:   03/02/22 98 kg (216 lb)   12/23/21 97.5 kg (215 lb)   11/16/21 98 kg (216 lb)     Last diabetic foot exam: 08/01/21  Last diabetic retinal exam:11/14/21    Plan:  - Starting trial of  Jardiance 10 mg daily. Standard side effects and precautions discussed. Patient is in the donut hole of insurance coverage currently, unsure if this will be covered/affordable.   - Increase Humulin 70/30 from 17 --> 18 U BID. Recommended patient take his insulin consistently unless his BS is less than 100. Patient aware.    - If patient's BS continue to be elevated over 200, instructed patient to call office for further insulin adjustments.  - Ordered Albumin.creatinine ratio, CMP (future)  - Follow up in 1 month  - Encouraged patient to check blood sugars regularly and to record these numbers as well as date/time taken  - Encouraged healthy diet and activity      Orders:  -     POCT glycosylated hemoglobin (Hb A1C)  -     POCT Glucose  -     Albumin/creatinine urine ratio  -     Comprehensive Metabolic Panel; Future  -     insulin NPH and regular human; Take 18 units before breakfast and before dinner each day    Hypogonadism  Overview:  Per Dr. Aleen Campi [facility] Endocrinology when he consulted with him 2022:  Endocrine history:  He was started on testosterone by a holistic doctor: testosterone sublingual for a year 27mg /ml, was on this for several years. He has ED, and reports fatigue. He has been researching online and asked about testing for testosterone so this was checked and he was referred to endocrinology at [facility]. 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      Other orders  -     empagliflozin; Take 1 tablet (10 mg total) by mouth daily.        Return in about 4 weeks (around 03/30/2022).    Patient Instructions   - Starting trial of Jardiance 10 mg daily.   - Increase insulin 70/30 from 17 --> 18 U twice daily. Please take insulin consistently unless your blood sugar is less than 100.   - If your blood sugar continues to be elevated over 200, please call office for further insulin adjustments.        Thank you for choosing [facility] Endocrinology with Dr. Amalia Hailey for your medical care!  If you have any questions about your appointment today, please call our office to speak with our nurse.  Speak With a Nurse   [facility] Endocrinology at [facility]  [phone] ext. [phone] [facility] Endocrinology at [facility]  [phone] ext. [phone]     [place] MyChart message replies can take 3 business days. [place] MyChart should not be used for urgent or emergent medical needs. [place] MyChart should not be used for new problems or multiple concerns. If you have multiple concerns, please call our office to schedule an appointment. [place] MyChart is not checked on nights, weekends, or holidays.    Schedule an Appointment   [facility] Endocrinology at [facility]  [phone] ext. [phone] [place] Endocrinology at [facility] [address]  [phone] ext. [phone]     If you are experiencing a medical emergency, please call 911 or proceed to your nearest emergency room.     If you have billing questions, please contact [facility] Billing Department at [phone] Monday - Thursday 8:00AM - 5:00PM and Friday 8:00AM - 11:30AM. For confidentiality purposes, you will need your [facility] account number and other patient identifying information before they can discuss your account with you.  Subjective:     History of Present Illness: See problem oriented charting.    Past Medical History:   Diagnosis Date    Arthritis     Asthma     Cancer (CMS-HCC)     Diabetes mellitus (CMS-HCC)     Hearing impairment     hearing aids    Hyperlipidemia     Kidney stone     Long term current use of systemic steroids 06/18/2018    Myocardial infarction (CMS-HCC)     Peripheral neuropathy     Risk for falls 12/23/2021    Visual impairment     cataract sx 05/27/20        Past Surgical History:   Procedure Laterality Date    CATARACT EXTRACTION      CATARACT EXTRACTION, BILATERAL      CHG ULTRASONIC GUIDANCE, INTRAOPERATIVE Right 02/21/2021    Procedure: ULTRASONIC GUIDANCE, INTRAOPERATIVE;  Surgeon: Vickii Penna, MD;  Location: [facility];  Service: Urology    esophaus rap surgery      PR CATH PLACE/CORON ANGIO, IMG SUPER/INTERP,W LEFT HEART VENTRICULOGRAPHY N/A 03/31/2015    Procedure: Left Heart Catheterization;  Surgeon: Joanie Coddington, MD;  Location: [facility] CATH;  Service: Cardiology    PR COLONOSCOPY FLX DX W/COLLJ SPEC WHEN PFRMD N/A 08/29/2019    Procedure: COLONOSCOPY, FLEXIBLE, PROXIMAL TO SPLENIC FLEXURE; DIAGNOSTIC, W/WO COLLECTION SPECIMEN BY BRUSH OR WASH;  Surgeon: Leland Her, MD;  Location: [facility];  Service: Gastroenterology    PR PERCUT DRAIN/INJECT RENAL CYST Right 02/21/2021    Procedure: ASPIRATION AND/OR INJECTION OF RENAL CYST OR PELVIS BY NEEDLE, PERCUTANEOUS;  Surgeon: Vickii Penna, MD;  [facility];  Service: Urology    PR PERQ NL/PL LITHOTRP COMPLEX >2 CM MLT LOCATIONS Right 02/21/2021    Procedure: supine standard PCNL; need fortec;  Surgeon: Vickii Penna, MD;  [facility]: [facility];  Service: Urology    PR PVB THORACIC SINGLE INJECTION SITE W/IMG GID Right 02/21/2021    Procedure: PARAVERTEBRAL BLOCK (PVB) (PARASPINOUS BLOCK), THORACIC; SINGLE INJECTION SITE (INCLUDES IMAGING GUIDANCE, WHEN PERFORMED);  Surgeon: Vickii Penna, MD;  Location[facility];  Service: Urology    PR UPPER GI ENDOSCOPY,BIOPSY N/A 08/29/2019    Procedure: UGI ENDOSCOPY; WITH BIOPSY, SINGLE OR MULTIPLE;  Surgeon: Leland Her, MD;  Location: [facility];  Service: Gastroenterology    removal for fatty tumors      SINUS SURGERY      SINUS SURGERY      SKIN BIOPSY      urolift         Current Outpatient Medications   Medication Sig Dispense Refill    albuterol 2.5 mg /3 mL (0.083 %) nebulizer solution Inhale 3 mL (2.5 mg total) by nebulization every six (6) hours as needed for wheezing. (Patient not taking: Reported on 12/23/2021) 3 mL 1    albuterol HFA 90 mcg/actuation inhaler Inhale 2 puffs every six (6) hours as needed for wheezing. 8 g 3    clobetasoL (TEMOVATE) 0.05 % cream Apply topically Two (2) times a day. 30 g 0    empagliflozin (JARDIANCE) 10 mg tablet Take 1 tablet (10 mg total) by mouth daily. 30 tablet 1    empty container (SHARPS-A-GATOR DISPOSAL SYSTEM) Misc Use as directed for sharps disposal 1 each 2    fluocinonide (LIDEX) 0.05 % external solution Apply topically Two (2)  times a day. 60 mL 1    fluticasone propion-salmeteroL (ADVAIR HFA) 230-21 mcg/actuation inhaler Inhale 2 puffs Two (2) times a day. 12 g 11    fluticasone propionate (FLONASE) 50 mcg/actuation nasal spray 2 sprays into each nostril daily. 16 g 11    folic acid (FOLVITE) 1 MG tablet Take 1 tablet (1 mg total) by mouth.      insulin NPH-insulin regular, 70/30, (HUMULIN/NOVOLIN) 100 unit/mL (70-30) injection Take 18 units before breakfast and before dinner each day 20 mL 3    insulin syr/ndl U100 half mark 0.5 mL 31 gauge x 15/64 (6 mm) Syrg by Miscellaneous route.       ketoconazole (NIZORAL) 2 % shampoo WASH SCALP 3 TIMES A WEEK. LEAVE IT IN PLACE FOR 30 MINUTES BEFORE RINSING       No current facility-administered medications for this visit.        Allergies   Allergen Reactions    Glipizide Shortness Of Breath    Actos [Pioglitazone]      Pt states it gave him a heart attack    Crestor [Rosuvastatin] Other (See Comments)     indigestion    Esomeprazole Magnesium Other (See Comments)     Makes patients heart burn worse    Esomeprazole Magnesium Other (See Comments)     Makes patients heart burn worse  Other reaction(s): Other (See Comments)  Makes patients heart burn worse  Makes patients heart burn worse  Makes patients heart burn worse      Liraglutide Other (See Comments)     bloating    Metformin      Hospitalized for shortness of breath/asthma.      Midazolam Other (See Comments)     Doesn't do well with after effects     Sitagliptin      Other reaction(s): JOINT PAIN    Amoxicillin Nausea Only     Other reaction(s): NAUSEA    Atorvastatin      indigestion    Erythromycin Base Nausea Only     Other reaction(s): OTHER    Glimepiride Rash     Other reaction(s): RASH    Oxycodone Nausea Only     Pt states he was severely and acutely nauseated and bloated        Family History   Problem Relation Age of Onset    Cancer Mother         throat cancer    Emphysema Father     Melanoma Neg Hx     Basal cell carcinoma Neg Hx     Squamous cell carcinoma Neg Hx            Review of Systems -  negative except as noted in HPI.     Objective:     Physical Exam:  BP 124/80  - Pulse 63  - Resp 12  - Wt 98 kg (216 lb)  - BMI 31.90 kg/m??   General appearance - alert, well appearing, and in no distress.  Eyes - No lid lag or stare, no proptosis, EOM's intact.  Mouth - mucous membranes moist.  Chest - clear, good excursion.  Neurological - no hand tremors. Normal gait.  Extremities - extremities warm.   Skin - warm, dry, no visible rashes.  Psych - Normal mood, appropriate affect.  Muskuloskeletal - No kyphosis or spine tenderness.    Lab Review:  I've reviewed the patient's most recent pertinent labs in the electronic record and provided records.  Lab  Results   Component Value Date    TSH 0.804 05/02/2021    FREET4 1.10 05/02/2021         Lab Results   Component Value Date    TSH 0.804 05/02/2021    TSH 1.209 06/25/2019    TSH 0.527 (L) 06/22/2017    FREET4 1.10 05/02/2021    FREET4 0.77 06/22/2017       Lab Results   Component Value Date    CALCIUM 9.3 09/15/2021    CALCIUM 9.6 07/19/2021    CALCIUM 9.7 05/02/2021    ALBUMIN 3.9 05/12/2021    CREATININE 0.94 09/15/2021       Lab Results   Component Value Date    A1C 7.6 (A) 03/02/2022    GLU 139 09/15/2021    CREATININE 0.94 09/15/2021    ALBCRERAT 77.0 (H) 12/27/2020    ALBQTUR 5.0 12/27/2020    CHOL 124 09/12/2021    HDL 44 09/12/2021    NONHDL 80 09/12/2021    LDL 35 (L) 09/12/2021    TRIG 226 (H) 09/12/2021    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     No results found for this or any previous visit.     No results found for this or any previous visit.       DEXA:    Results for orders placed during the hospital encounter of 09/28/20    XR Dexa Bone Density Skeletal    Narrative  EXAM: QDR BODY NON-EXTREMITIES  DATE: 09/28/2020 8:46 AM  ACCESSION: [id number]  DICTATED: 09/28/2020 3:37 PM  INTERPRETATION LOCATION: Main Campus    CLINICAL INDICATION: 81 years old Male with osteopenia    TECHNIQUE: Dual energy x-ray absorptiometry was performed assessing the bone mineral density in the lumbar spine and proximal left femur using a Hologic Horizon A densitometer.    FINDINGS: The bone mineral density in the spine measuring L1 to 4 measures 1.290 gm/cm2.  The  Z score is 2.9 and the T score is 1.8.    The total bone mineral density in the proximal left femur measures 1.283 gm/cm2.  The Z score is 2.6 and the T score is 1.7.  The femoral neck density is 0.951 gm/cm2, and the T score is 0.2.    Impression  Lumbar spine: Normal bone density    Left proximal femur: Normal bone density             Other Medical Data :       I've personally reviewed and summarized records in EPIC/Media and via CareEverywhere as well as medication, allergies, past medical, social, and family history.     I attest that I, Katheran Awe Elener Custodio, personally documented this note while acting as scribe for Marvia Pickles, MD.      Katheran Awe Izzabella Besse, Scribe.  03/02/2022     The documentation recorded by the scribe accurately reflects the service I personally performed and the decisions made by me.    Marvia Pickles, MD

## 2022-03-02 ENCOUNTER — Ambulatory Visit: Admit: 2022-03-02 | Discharge: 2022-03-03 | Payer: MEDICARE | Attending: "Endocrinology | Primary: "Endocrinology

## 2022-03-02 DIAGNOSIS — N500 Atrophy of testis: Principal | ICD-10-CM

## 2022-03-02 DIAGNOSIS — E1142 Type 2 diabetes mellitus with diabetic polyneuropathy: Principal | ICD-10-CM

## 2022-03-02 DIAGNOSIS — Z794 Long term (current) use of insulin: Principal | ICD-10-CM

## 2022-03-02 LAB — ALBUMIN / CREATININE URINE RATIO
ALBUMIN QUANT URINE: 2.1 mg/dL
ALBUMIN/CREATININE RATIO: 25.1 ug/mg (ref 0.0–30.0)
CREATININE, URINE: 83.7 mg/dL

## 2022-03-02 MED ORDER — EMPAGLIFLOZIN 10 MG TABLET
ORAL_TABLET | Freq: Every day | ORAL | 1 refills | 30 days | Status: CP
Start: 2022-03-02 — End: 2023-03-02

## 2022-03-02 NOTE — Unmapped (Addendum)
-   Starting trial of Jardiance 10 mg daily.   - Increase insulin 70/30 from 17 --> 18 U twice daily. Please take insulin consistently unless your blood sugar is less than 100.   - If your blood sugar continues to be elevated over 200, please call office for further insulin adjustments.        Thank you for choosing [facility] Endocrinology with Dr. Amalia Hailey for your medical care!  If you have any questions about your appointment today, please call our office to speak with our nurse.  Speak With a Nurse   [facility] Endocrinology at [facility]  [phone] ext. [phone] [facility] Endocrinology at [facility]  [phone] ext. [phone]     [place] MyChart message replies can take 3 business days. [place] MyChart should not be used for urgent or emergent medical needs. [place] MyChart should not be used for new problems or multiple concerns. If you have multiple concerns, please call our office to schedule an appointment. [place] MyChart is not checked on nights, weekends, or holidays.    Schedule an Appointment   [facility] Endocrinology at [facility]  [phone] ext. [phone]  Endocrinology at [facility]  [phone] ext. [phone]     If you are experiencing a medical emergency, please call 911 or proceed to your nearest emergency room.     If you have billing questions, please contact [facility] Billing Department at [phone] Monday - Thursday 8:00AM - 5:00PM and Friday 8:00AM - 11:30AM. For confidentiality purposes, you will need your [facility] account number and other patient identifying information before they can discuss your account with you.

## 2022-03-02 NOTE — Unmapped (Addendum)
Type 2 Diabetes Mellitus    Assessment: Diabetes is not well controlled with current medication regimen and lifestyle. At last OV, started trial Ozempic 0.25 mg weekly. Patient did not tolerate this, so he restarted Humulin 17 U BID. His A1C increased from 6.5 to 7.6 today.     *The patient requires a therapeutic CGM.   *The patient has diabetes mellitus and is insulin-treated with 3 or more daily administrations of insulin(MDII) or a continuous subcutaneous insulin infusion (CSII) pump.   *The patient???s insulin treatment regimen requires frequent adjustments by the patient on the basis of therapeutic CGM testing results.   *Within the last six months prior to ordering the CGM, I had an in-person visit with the beneficiary to evaluate their diabetes control and determine that the above criteria are met.   *Every six months following the initial prescription of the CGM, I will have an in-person visit with the beneficiary to assess adherence to their CGM regimen and diabetes treatment plan.          Lab Results   Component Value Date    A1C 7.6 (A) 03/02/2022    A1C 6.5 (A) 11/16/2021    A1C 7.5 (H) 08/01/2021      Wt Readings from Last 3 Encounters:   03/02/22 98 kg (216 lb)   12/23/21 97.5 kg (215 lb)   11/16/21 98 kg (216 lb)     Last diabetic foot exam: 08/01/21  Last diabetic retinal exam:11/14/21    Plan:  - Starting trial of Jardiance 10 mg daily. Standard side effects and precautions discussed. Patient is in the donut hole of insurance coverage currently, unsure if this will be covered/affordable.   - Increase Humulin 70/30 from 17 --> 18 U BID. Recommended patient take his insulin consistently unless his BS is less than 100. Patient aware.    - If patient's BS continue to be elevated over 200, instructed patient to call office for further insulin adjustments.  - Ordered Albumin.creatinine ratio, CMP (future)  - Follow up in 1 month  - Encouraged patient to check blood sugars regularly and to record these numbers as well as date/time taken  - Encouraged healthy diet and activity

## 2022-03-10 NOTE — Unmapped (Signed)
Specialty Medication(s): Praluent    Mr.Arntzen has been dis-enrolled from the [facility] specialty pharmacy services due to  patient declined refill, says he is no longer taking .    Additional information provided to the patient: n/a    Camillo Flaming, PharmD  [facility] Specialty Pharmacist

## 2022-04-11 ENCOUNTER — Ambulatory Visit
Admit: 2022-04-11 | Discharge: 2022-04-12 | Payer: MEDICARE | Attending: Cardiovascular Disease | Primary: Cardiovascular Disease

## 2022-04-11 DIAGNOSIS — Z794 Long term (current) use of insulin: Principal | ICD-10-CM

## 2022-04-11 DIAGNOSIS — I240 Acute coronary thrombosis not resulting in myocardial infarction: Principal | ICD-10-CM

## 2022-04-11 DIAGNOSIS — I2111 ST elevation (STEMI) myocardial infarction involving right coronary artery: Principal | ICD-10-CM

## 2022-04-11 DIAGNOSIS — I259 Chronic ischemic heart disease, unspecified: Principal | ICD-10-CM

## 2022-04-11 DIAGNOSIS — E1142 Type 2 diabetes mellitus with diabetic polyneuropathy: Principal | ICD-10-CM

## 2022-04-11 DIAGNOSIS — E785 Hyperlipidemia, unspecified: Principal | ICD-10-CM

## 2022-06-13 ENCOUNTER — Ambulatory Visit (INDEPENDENT_AMBULATORY_CARE_PROVIDER_SITE_OTHER): Payer: Medicare HMO | Admitting: Urology

## 2022-06-13 ENCOUNTER — Encounter: Payer: Self-pay | Admitting: Urology

## 2022-06-13 VITALS — BP 153/85 | HR 86 | Ht 69.0 in | Wt 209.0 lb

## 2022-06-13 DIAGNOSIS — R310 Gross hematuria: Secondary | ICD-10-CM

## 2022-06-13 LAB — MICROSCOPIC EXAMINATION: Bacteria, UA: NONE SEEN

## 2022-06-13 LAB — URINALYSIS, COMPLETE
Bilirubin, UA: NEGATIVE
Ketones, UA: NEGATIVE
Leukocytes,UA: NEGATIVE
Nitrite, UA: NEGATIVE
Protein,UA: NEGATIVE
RBC, UA: NEGATIVE
Specific Gravity, UA: 1.01 (ref 1.005–1.030)
Urobilinogen, Ur: 0.2 mg/dL (ref 0.2–1.0)
pH, UA: 5 (ref 5.0–7.5)

## 2022-06-13 NOTE — Progress Notes (Signed)
I, Jeanmarie Hubert Maxie,acting as a scribe for Hollice Espy, MD.,have documented all relevant documentation on the behalf of Hollice Espy, MD,as directed by  Hollice Espy, MD while in the presence of Hollice Espy, MD.   06/13/22 2:53 PM   Dustin Blackburn [birth year] [id number]  Referring provider: Romualdo Bolk, FNP [address] [place],  [place] [address]  Chief Complaint  Patient presents with   Acute Visit   Hematuria    HPI: 82 year-old male with a personal history of kidney stones and bladder cancer who presents today earlier than scheduled due to an episode of gross hematuria.  His last cystoscopy by me was in 12/2021 which showed no evidence of malignancy and is scheduled for cysto annually.  He has a personal of low grade non-invasive TCC in 2017. He was initially found to have incidental bladder wall thickening on CT abdomen pelvis on 03/2016. Cystoscopy showed a papillary proximal and 1 cm tumor at the dome of the bladder. He was taken to the operating room on 06/12/2016 for TURBT, bilateral retrograde, and instillation of mitomycin. He's had several lesions since then, biopsied all which are negative for recurrence.  His urinalysis today is negative.  He states that he noticed gross hematuria last week, last seen on Saturday. He denies pain or any urinary issues. He notes some unusual veins on the surface of his scrotum but the bleeding was mixed in with his urine and not just on his undergarments.  He did not have any associated flank pain or sensation of kidney stone event at the time of his hematuria.  Last cross-sectional imaging dated 06/2021.   PMH: Past Medical History:  Diagnosis Date   Arthritis    Asthma    Cancer ([place]) 2018   bladder   Cataracts, bilateral    Cold    COPD (chronic obstructive pulmonary disease) (HCC)    Coronary artery disease    DDD (degenerative disc disease), cervical    Depression    Diabetes mellitus without complication (HCC)     Dyspnea    GERD (gastroesophageal reflux disease)    Hearing loss associated with syndrome of both ears    Heart attack ([place]) 2016   stented x 1   History of aspiration pneumonitis    History of kidney stones    Hyperlipidemia    Kidney cysts    Kidney stones    Osteoarthritis of back    Psoriasis    Psoriasis    Seasonal allergies    Wears dentures    full upper    Surgical History: Past Surgical History:  Procedure Laterality Date   CARDIAC CATHETERIZATION  2016 and 2018   CATARACT EXTRACTION W/PHACO Left 05/27/2020   Procedure: CATARACT EXTRACTION PHACO AND INTRAOCULAR LENS PLACEMENT ([place]) LEFT DIABETIC;  Surgeon: Eulogio Bear, MD;  Location: [facility];  Service: Ophthalmology;  Laterality: Left;  3.73 0:38.1   CATARACT EXTRACTION W/PHACO Right 06/21/2020   Procedure: CATARACT EXTRACTION PHACO AND INTRAOCULAR LENS PLACEMENT ([place]) RIGHT DIABETIC;  Surgeon: Eulogio Bear, MD;  Location: [facility];  Service: Ophthalmology;  Laterality: Right;  3.20 0:30.5   COLONOSCOPY     COLONOSCOPY WITH PROPOFOL N/A 09/18/2017   Procedure: COLONOSCOPY WITH PROPOFOL;  Surgeon: Lollie Sails, MD;  Location: [facility] ENDOSCOPY;  Service: Endoscopy;  Laterality: N/A;   CORONARY ANGIOPLASTY  2016   CORONARY STENT PLACEMENT  2016   CYSTOSCOPY W/ RETROGRADES Bilateral 06/12/2016   Procedure: CYSTOSCOPY WITH  RETROGRADE PYELOGRAM;  Surgeon: Hollice Espy, MD;  Location: [facility];  Service: Urology;  Laterality: Bilateral;   CYSTOSCOPY W/ RETROGRADES Bilateral 10/02/2017   Procedure: CYSTOSCOPY WITH RETROGRADE PYELOGRAM;  Surgeon: Hollice Espy, MD;  Location: [facility];  Service: Urology;  Laterality: Bilateral;   CYSTOSCOPY WITH BIOPSY N/A 10/02/2017   Procedure: CYSTOSCOPY WITH Bladder BIOPSY;  Surgeon: Hollice Espy, MD;  Location: [facility];  Service: Urology;  Laterality: N/A;   CYSTOSCOPY WITH BIOPSY N/A 11/10/2019   Procedure: CYSTOSCOPY WITH Bladder BIOPSY;   Surgeon: Hollice Espy, MD;  Location: [facility];  Service: Urology;  Laterality: N/A;   CYSTOSCOPY WITH INSERTION OF UROLIFT N/A 11/10/2019   Procedure: CYSTOSCOPY WITH INSERTION OF UROLIFT;  Surgeon: Hollice Espy, MD;  Location: [facility];  Service: Urology;  Laterality: N/A;   CYSTOSCOPY WITH STENT PLACEMENT Right 11/10/2019   Procedure: CYSTOSCOPY WITH STENT PLACEMENT;  Surgeon: Hollice Espy, MD;  Location: [facility];  Service: Urology;  Laterality: Right;   CYSTOSCOPY WITH URETEROSCOPY Right 10/02/2017   Procedure: DIAGNOSTIC RIGHT URETEROSCOPY;  Surgeon: Hollice Espy, MD;  Location: [facility];  Service: Urology;  Laterality: Right;   ESOPHAGOGASTRODUODENOSCOPY (EGD) WITH PROPOFOL N/A 09/18/2017   Procedure: ESOPHAGOGASTRODUODENOSCOPY (EGD) WITH PROPOFOL;  Surgeon: Lollie Sails, MD;  Location[facility] ENDOSCOPY;  Service: Endoscopy;  Laterality: N/A;   fatty tumors removed     HIATAL HERNIA REPAIR  1992   LIPOMA EXCISION     right leg   NASOPHARYNGOSCOPY EUSTATION TUBE BALLOON DILATION Bilateral 06/13/2018   Procedure: NASOPHARYNGOSCOPY EUSTATION TUBE BALLOON DILATION DIABETIC, NEED EUSTATION TUBE BALLOON SYSTEM;  Surgeon: Margaretha Sheffield, MD;  Location: [facility];  Service: ENT;  Laterality: Bilateral;   RIGHT/LEFT HEART CATH AND CORONARY ANGIOGRAPHY N/A 12/21/2016   Procedure: Right/Left Heart Cath and Coronary Angiography;  Surgeon: Isaias Cowman, MD;  Location: [facility];  Service: Cardiovascular;  Laterality: N/A;   SINUS EXPLORATION  1992   TRANSURETHRAL RESECTION OF BLADDER TUMOR WITH MITOMYCIN-C N/A 06/12/2016   Procedure: TRANSURETHRAL RESECTION OF BLADDER TUMOR WITH MITOMYCIN-C;  Surgeon: Hollice Espy, MD;  Location: [facility];  Service: Urology;  Laterality: N/A;   TURBINATE REDUCTION Bilateral 06/13/2018   Procedure: Orrin Brigham;  Surgeon: Margaretha Sheffield, MD;  Location: [facility];  Service: ENT;  Laterality: Bilateral;  Diabetic -  insulin   UMBILICAL HERNIA REPAIR N/A 06/12/2016   Procedure: HERNIA REPAIR UMBILICAL ADULT;  Surgeon: Christene Lye, MD;  Location: [facility];  Service: General;  Laterality: N/A;   URETEROSCOPY Right 06/12/2016   Procedure: URETEROSCOPY;  Surgeon: Hollice Espy, MD;  Location: [facility];  Service: Urology;  Laterality: Right;   URETEROSCOPY WITH HOLMIUM LASER LITHOTRIPSY Right 11/10/2019   Procedure: URETEROSCOPY WITH HOLMIUM LASER LITHOTRIPSY;  Surgeon: Hollice Espy, MD;  Location: [facility];  Service: Urology;  Laterality: Right;    Home Medications:  Allergies as of 06/13/2022       Reactions   Actos [pioglitazone] Palpitations   Pt reported a MI    Glimepiride Rash   Shortness of breath also   Glipizide Shortness Of Breath   Metformin Shortness Of Breath   Pt states he "can't breathe and it gives him sinus congestion".   Versed [midazolam]    Over-sedation.  Hard to clear from system.   Amoxicillin Nausea Only   Erythromycin Base Nausea Only   Esomeprazole Magnesium Other (See Comments)   Makes patients heart burn worse   Lipitor [atorvastatin]    indigestion   Liraglutide Other (  See Comments)   bloating   Oxycodone Nausea Only   Pt states he was severely and acutely nauseated and bloated  Pt states he was severely and acutely nauseated and bloated   Penicillins Nausea Only   Has patient had a PCN reaction causing immediate rash, facial/tongue/throat swelling, SOB or lightheadedness with hypotension: no Has patient had a PCN reaction causing severe rash involving mucus membranes or skin necrosis: no Has patient had a PCN reaction that required hospitalization:no Has patient had a PCN reaction occurring within the last 10 years: no If all of the above answers are "NO", then may proceed with Cephalosporin use.   Sitagliptin Other (See Comments)   Pt states it "gives him joint pain" & redness        Medication List        Accurate as of June 13, 2022  2:53  PM. If you have any questions, ask your nurse or doctor.          albuterol 108 (90 Base) MCG/ACT inhaler Commonly known as: VENTOLIN HFA Inhale 2 puffs into the lungs every 6 (six) hours as needed for wheezing or shortness of breath.   clobetasol cream 0.05 % Commonly known as: TEMOVATE Apply 1 application topically 2 (two) times daily as needed (psoriasis).   DICLOFENAC PO Take by mouth as needed.   fluocinonide cream 0.05 % Commonly known as: LIDEX Apply 1 application topically as needed.   fluticasone 50 MCG/ACT nasal spray Commonly known as: FLONASE Place into both nostrils daily as needed for allergies or rhinitis.   hydrocortisone 5 MG tablet Commonly known as: CORTEF Take by mouth.   oxybutynin 5 MG tablet Commonly known as: DITROPAN Take by mouth.   Potassium Citrate 15 MEQ (1620 MG) Tbcr Take 2 tablets by mouth 2 (two) times daily with a meal.   ReliOn Pen Needles 31G X 6 MM Misc Generic drug: Insulin Pen Needle   Symbicort 160-4.5 MCG/ACT inhaler Generic drug: budesonide-formoterol Inhale 2 puffs into the lungs 2 (two) times daily as needed for shortness of breath.   testosterone 50 MG/5GM (1%) Gel Commonly known as: ANDROGEL Place 5 g onto the skin daily as needed.   Testosterone 20.25 MG/1.25GM (1.62%) Gel Use one packet, 20.'25mg'$  daily.  Apply to shoulder        Allergies:  Allergies  Allergen Reactions   Actos [Pioglitazone] Palpitations    Pt reported a MI    Glimepiride Rash    Shortness of breath also   Glipizide Shortness Of Breath   Metformin Shortness Of Breath    Pt states he "can't breathe and it gives him sinus congestion".    Versed [Midazolam]     Over-sedation.  Hard to clear from system.   Amoxicillin Nausea Only   Erythromycin Base Nausea Only   Esomeprazole Magnesium Other (See Comments)    Makes patients heart burn worse   Lipitor [Atorvastatin]     indigestion   Liraglutide Other (See Comments)    bloating    Oxycodone Nausea Only    Pt states he was severely and acutely nauseated and bloated   Pt states he was severely and acutely nauseated and bloated   Penicillins Nausea Only    Has patient had a PCN reaction causing immediate rash, facial/tongue/throat swelling, SOB or lightheadedness with hypotension: no Has patient had a PCN reaction causing severe rash involving mucus membranes or skin necrosis: no Has patient had a PCN reaction that required hospitalization:no Has patient had a  PCN reaction occurring within the last 10 years: no If all of the above answers are "NO", then may proceed with Cephalosporin use.    Sitagliptin Other (See Comments)    Pt states it "gives him joint pain" & redness     Family History: Family History  Problem Relation Age of Onset   Cancer Mother    Prostate cancer Maternal Uncle    Prostate cancer Paternal Uncle    Prostate cancer Cousin    Congestive Heart Failure Father    Kidney cancer Neg Hx    Bladder Cancer Neg Hx     Social History:  reports that he quit smoking about 32 years ago. His smoking use included cigarettes. He has been exposed to tobacco smoke. He quit smokeless tobacco use about 32 years ago.  His smokeless tobacco use included chew. He reports current alcohol use. He reports that he does not use drugs.   Physical Exam: BP (!) 153/85   Pulse 86   Ht '5\' 9"'$  (1.753 m)   Wt 209 lb (94.8 kg)   BMI 30.86 kg/m   Constitutional:  Alert and oriented, No acute distress. HEENT: [place] AT, moist mucus membranes.  Trachea midline, no masses. Neurologic: Grossly intact, no focal deficits, moving all 4 extremities. Psychiatric: Normal mood and affect.  Assessment & Plan:    Gross hematuria -In the setting of painless episode of gross hematuria and personal history of bladder cancer, will expedite cystoscopy, schedule sooner than previously scheduled -He is also not had any recent upper tract imaging and is high risk.  Will plan for CT urogram  prior to cystoscopy.  He is agreeable this plan.  Return for cystoscopy next month, CT prior .  I have reviewed the above documentation for accuracy and completeness, and I agree with the above.   Hollice Espy, MD   [facility] [address] [place], [place] [address] [phone]

## 2022-06-14 MED ORDER — EMPAGLIFLOZIN 10 MG TABLET
ORAL_TABLET | Freq: Every day | ORAL | 1 refills | 30 days
Start: 2022-06-14 — End: 2023-06-14

## 2022-06-17 ENCOUNTER — Ambulatory Visit (INDEPENDENT_AMBULATORY_CARE_PROVIDER_SITE_OTHER): Payer: Medicare HMO

## 2022-06-17 ENCOUNTER — Ambulatory Visit
Admission: EM | Admit: 2022-06-17 | Discharge: 2022-06-17 | Disposition: A | Discharge status: Home / Self Care | Payer: Medicare HMO | Attending: Physician Assistant | Admitting: Physician Assistant

## 2022-06-17 DIAGNOSIS — R0602 Shortness of breath: Secondary | ICD-10-CM | POA: Diagnosis not present

## 2022-06-17 DIAGNOSIS — J189 Pneumonia, unspecified organism: Secondary | ICD-10-CM

## 2022-06-17 DIAGNOSIS — R051 Acute cough: Secondary | ICD-10-CM | POA: Diagnosis not present

## 2022-06-17 DIAGNOSIS — J441 Chronic obstructive pulmonary disease with (acute) exacerbation: Secondary | ICD-10-CM

## 2022-06-17 MED ORDER — ALBUTEROL SULFATE (2.5 MG/3ML) 0.083% IN NEBU: 2.5000 mg | INHALATION_SOLUTION | Freq: Four times a day (QID) | RESPIRATORY_TRACT | 1 refills | Status: DC | PRN

## 2022-06-17 MED ORDER — IPRATROPIUM-ALBUTEROL 0.5-2.5 (3) MG/3ML IN SOLN
3.0000 mL | Freq: Once | RESPIRATORY_TRACT | Status: AC
  Administered 2022-06-17: 3 mL via RESPIRATORY_TRACT

## 2022-06-17 MED ORDER — PROMETHAZINE-DM 6.25-15 MG/5ML PO SYRP: 5.0000 mL | ORAL_SOLUTION | Freq: Four times a day (QID) | ORAL | 0 refills | Status: DC | PRN

## 2022-06-17 MED ORDER — PREDNISONE 20 MG PO TABS: 40.0000 mg | ORAL_TABLET | Freq: Every day | ORAL | 0 refills | Status: AC

## 2022-06-17 MED ORDER — LEVOFLOXACIN 750 MG PO TABS: 750.0000 mg | ORAL_TABLET | Freq: Every day | ORAL | 0 refills | Status: AC

## 2022-06-17 NOTE — ED Provider Notes (Signed)
[facility]    CSN: [id number] Arrival date & time: 06/17/22  1133      History   Chief Complaint Chief Complaint  Patient presents with   Cough   Shortness of Breath    HPI Dustin Blackburn is a 82 y.o. male with history of asthma, diabetes, coronary artery disease, COPD, previous MI, hypertension, hyperlipidemia, psoriasis, allergies and arthritis.    Patient presents today for 1 week history of cough, congestion, wheezing and shortness of breath.  Patient reports that he used his albuterol treatment this morning and it did not really seem to help his breathing.  He says he has not been improving and he believes they are only getting worse.  No known history of COVID or flu exposure.  HPI  Past Medical History:  Diagnosis Date   Arthritis    Asthma    Cancer ([place]) 2018   bladder   Cataracts, bilateral    Cold    COPD (chronic obstructive pulmonary disease) (HCC)    Coronary artery disease    DDD (degenerative disc disease), cervical    Depression    Diabetes mellitus without complication (HCC)    Dyspnea    GERD (gastroesophageal reflux disease)    Hearing loss associated with syndrome of both ears    Heart attack ([place]) 2016   stented x 1   History of aspiration pneumonitis    History of kidney stones    Hyperlipidemia    Kidney cysts    Kidney stones    Osteoarthritis of back    Psoriasis    Psoriasis    Seasonal allergies    Wears dentures    full upper    Patient Active Problem List   Diagnosis Date Noted   Allergic rhinitis 06/05/2019   Abnormal findings on diagnostic imaging of lung 06/05/2019   Advice given about COVID-19 virus infection 06/05/2019   Arthritis 06/05/2019   Preop cardiovascular exam 09/28/2017   Unstable angina pectoris ([place]) 12/21/2016   CAFL (chronic airflow limitation) ([place]) 02/22/2015   Bilateral hearing loss 02/22/2015   ST elevation (STEMI) myocardial infarction ([place]) 12/31/2014   DDD (degenerative disc  disease), cervical 08/28/2014   HLD (hyperlipidemia) 01/13/2014   Hay fever 05/14/2012   Brachial neuritis 10/20/2010   Psoriasis 08/06/2009   Diabetes mellitus, type 2 ([place]) 07/15/2009   Adiposity 07/30/2008   Sinusitis, chronic 05/01/2007   Airway hyperreactivity 08/14/2000   Atherosclerosis of coronary artery 08/14/2000   Acid reflux 08/14/2000    Past Surgical History:  Procedure Laterality Date   CARDIAC CATHETERIZATION  2016 and 2018   CATARACT EXTRACTION W/PHACO Left 05/27/2020   Procedure: CATARACT EXTRACTION PHACO AND INTRAOCULAR LENS PLACEMENT ([place]) LEFT DIABETIC;  Surgeon: Eulogio Bear, MD;  Location: [place];  Service: Ophthalmology;  Laterality: Left;  3.73 0:38.1   CATARACT EXTRACTION W/PHACO Right 06/21/2020   Procedure: CATARACT EXTRACTION PHACO AND INTRAOCULAR LENS PLACEMENT ([place]) RIGHT DIABETIC;  Surgeon: Eulogio Bear, MD;  Location: [place];  Service: Ophthalmology;  Laterality: Right;  3.20 0:30.5   COLONOSCOPY     COLONOSCOPY WITH PROPOFOL N/A 09/18/2017   Procedure: COLONOSCOPY WITH PROPOFOL;  Surgeon: Lollie Sails, MD;  Location: [facility] ENDOSCOPY;  Service: Endoscopy;  Laterality: N/A;   CORONARY ANGIOPLASTY  2016   CORONARY STENT PLACEMENT  2016   CYSTOSCOPY W/ RETROGRADES Bilateral 06/12/2016   Procedure: CYSTOSCOPY WITH RETROGRADE PYELOGRAM;  Surgeon: Hollice Espy, MD;  Location: [facility];  Service: Urology;  Laterality: Bilateral;   CYSTOSCOPY W/ RETROGRADES Bilateral 10/02/2017   Procedure: CYSTOSCOPY WITH RETROGRADE PYELOGRAM;  Surgeon: Hollice Espy, MD;  Location: [facility];  Service: Urology;  Laterality: Bilateral;   CYSTOSCOPY WITH BIOPSY N/A 10/02/2017   Procedure: CYSTOSCOPY WITH Bladder BIOPSY;  Surgeon: Hollice Espy, MD;  Location: [facility];  Service: Urology;  Laterality: N/A;   CYSTOSCOPY WITH BIOPSY N/A 11/10/2019   Procedure: CYSTOSCOPY WITH Bladder BIOPSY;  Surgeon: Hollice Espy, MD;  Location: [facility];  Service: Urology;  Laterality: N/A;   CYSTOSCOPY WITH INSERTION OF UROLIFT N/A 11/10/2019   Procedure: CYSTOSCOPY WITH INSERTION OF UROLIFT;  Surgeon: Hollice Espy, MD;  Location: [facility];  Service: Urology;  Laterality: N/A;   CYSTOSCOPY WITH STENT PLACEMENT Right 11/10/2019   Procedure: CYSTOSCOPY WITH STENT PLACEMENT;  Surgeon: Hollice Espy, MD;  Location: [facility];  Service: Urology;  Laterality: Right;   CYSTOSCOPY WITH URETEROSCOPY Right 10/02/2017   Procedure: DIAGNOSTIC RIGHT URETEROSCOPY;  Surgeon: Hollice Espy, MD;  Location: [facility];  Service: Urology;  Laterality: Right;   ESOPHAGOGASTRODUODENOSCOPY (EGD) WITH PROPOFOL N/A 09/18/2017   Procedure: ESOPHAGOGASTRODUODENOSCOPY (EGD) WITH PROPOFOL;  Surgeon: Lollie Sails, MD;  Location: [facility] ENDOSCOPY;  Service: Endoscopy;  Laterality: N/A;   fatty tumors removed     HIATAL HERNIA REPAIR  1992   LIPOMA EXCISION     right leg   NASOPHARYNGOSCOPY EUSTATION TUBE BALLOON DILATION Bilateral 06/13/2018   Procedure: NASOPHARYNGOSCOPY EUSTATION TUBE BALLOON DILATION DIABETIC, NEED EUSTATION TUBE BALLOON SYSTEM;  Surgeon: Margaretha Sheffield, MD;  Location: [place];  Service: ENT;  Laterality: Bilateral;   RIGHT/LEFT HEART CATH AND CORONARY ANGIOGRAPHY N/A 12/21/2016   Procedure: Right/Left Heart Cath and Coronary Angiography;  Surgeon: Isaias Cowman, MD;  Location: [facility];  Service: Cardiovascular;  Laterality: N/A;   SINUS EXPLORATION  1992   TRANSURETHRAL RESECTION OF BLADDER TUMOR WITH MITOMYCIN-C N/A 06/12/2016   Procedure: TRANSURETHRAL RESECTION OF BLADDER TUMOR WITH MITOMYCIN-C;  Surgeon: Hollice Espy, MD;  Location: [facility];  Service: Urology;  Laterality: N/A;   TURBINATE REDUCTION Bilateral 06/13/2018   Procedure: Orrin Brigham;  Surgeon: Margaretha Sheffield, MD;  Location: [place];  Service: ENT;  Laterality: Bilateral;  Diabetic - insulin   UMBILICAL HERNIA REPAIR N/A  06/12/2016   Procedure: HERNIA REPAIR UMBILICAL ADULT;  Surgeon: Christene Lye, MD;  Location: [facility];  Service: General;  Laterality: N/A;   URETEROSCOPY Right 06/12/2016   Procedure: URETEROSCOPY;  Surgeon: Hollice Espy, MD;  Location: [facility];  Service: Urology;  Laterality: Right;   URETEROSCOPY WITH HOLMIUM LASER LITHOTRIPSY Right 11/10/2019   Procedure: URETEROSCOPY WITH HOLMIUM LASER LITHOTRIPSY;  Surgeon: Hollice Espy, MD;  Location: [facility];  Service: Urology;  Laterality: Right;       Home Medications    Prior to Admission medications   Medication Sig Start Date End Date Taking? Authorizing Provider  albuterol (PROVENTIL) (2.5 MG/3ML) 0.083% nebulizer solution Take 3 mLs (2.5 mg total) by nebulization every 6 (six) hours as needed for wheezing or shortness of breath. 06/17/22  Yes Laurene Footman B, PA-C  clobetasol cream (TEMOVATE) 4.12 % Apply 1 application topically 2 (two) times daily as needed (psoriasis).  09/15/19  Yes [provider]  fluocinonide cream (LIDEX) 8.78 % Apply 1 application topically as needed.   Yes [provider]  hydrocortisone (CORTEF) 5 MG tablet Take by mouth. 10/24/20  Yes [provider]  levofloxacin (LEVAQUIN) 750 MG tablet Take 1 tablet (750 mg total)  by mouth daily for 5 days. 06/17/22 06/22/22 Yes Danton Clap, PA-C  predniSONE (DELTASONE) 20 MG tablet Take 2 tablets (40 mg total) by mouth daily for 5 days. 06/17/22 06/22/22 Yes Danton Clap, PA-C  promethazine-dextromethorphan (PROMETHAZINE-DM) 6.25-15 MG/5ML syrup Take 5 mLs by mouth 4 (four) times daily as needed. 06/17/22  Yes Laurene Footman B, PA-C  albuterol (PROVENTIL HFA;VENTOLIN HFA) 108 (90 Base) MCG/ACT inhaler Inhale 2 puffs into the lungs every 6 (six) hours as needed for wheezing or shortness of breath.    [provider]  DICLOFENAC PO Take by mouth as needed.    [provider]  fluticasone (FLONASE) 50 MCG/ACT nasal spray Place  into both nostrils daily as needed for allergies or rhinitis.    [provider]  oxybutynin (DITROPAN) 5 MG tablet Take by mouth. 12/16/19   [provider]  Potassium Citrate 15 MEQ (1620 MG) TBCR Take 2 tablets by mouth 2 (two) times daily with a meal. 09/02/20   Vaillancourt, Samantha, PA-C  RELION PEN NEEDLES 31G X 6 MM [place]  10/08/17   [provider]  SYMBICORT 160-4.5 MCG/ACT inhaler Inhale 2 puffs into the lungs 2 (two) times daily as needed for shortness of breath. 09/16/19   [provider]  testosterone (ANDROGEL) 50 MG/5GM (1%) GEL Place 5 g onto the skin daily as needed.    [provider]  Testosterone 20.25 MG/1.25GM (1.62%) GEL Use one packet, 20.'25mg'$  daily.  Apply to shoulder 11/02/20   [provider]    Family History Family History  Problem Relation Age of Onset   Cancer Mother    Prostate cancer Maternal Uncle    Prostate cancer Paternal Uncle    Prostate cancer Cousin    Congestive Heart Failure Father    Kidney cancer Neg Hx    Bladder Cancer Neg Hx     Social History Social History   Tobacco Use   Smoking status: Former    Years: 30.00    Types: Cigarettes    Quit date: 05/29/1990    Years since quitting: 32.0    Passive exposure: Past   Smokeless tobacco: Former    Types: Chew    Quit date: 1992  Vaping Use   Vaping Use: Never used  Substance Use Topics   Alcohol use: Not Currently    Comment: rarely   Drug use: No     Allergies   Actos [pioglitazone], Glimepiride, Glipizide, Metformin, Versed [midazolam], Amoxicillin, Erythromycin base, Esomeprazole magnesium, Lipitor [atorvastatin], Liraglutide, Oxycodone, Penicillins, and Sitagliptin   Review of Systems Review of Systems  Constitutional:  Positive for fatigue. Negative for fever.  HENT:  Positive for congestion and rhinorrhea. Negative for sinus pressure, sinus pain and sore throat.   Respiratory:  Positive for cough, shortness of breath and  wheezing.   Gastrointestinal:  Negative for abdominal pain, diarrhea, nausea and vomiting.  Musculoskeletal:  Negative for myalgias.  Neurological:  Negative for weakness, light-headedness and headaches.  Hematological:  Negative for adenopathy.     Physical Exam Triage Vital Signs ED Triage Vitals  Enc Vitals Group     BP      Pulse      Resp      Temp      Temp src      SpO2      Weight      Height      Head Circumference      Peak Flow  Pain Score      Pain Loc      Pain Edu?      Excl. in [place]?    No data found.  Updated Vital Signs BP (!) 154/86 (BP Location: Left Arm)   Pulse 92   Temp 97.8 F (36.6 C) (Oral)   Resp 18   Ht '5\' 9"'$  (1.753 m)   Wt 210 lb (95.3 kg)   SpO2 95%   BMI 31.01 kg/m       Physical Exam Vitals and nursing note reviewed.  Constitutional:      General: He is not in acute distress.    Appearance: Normal appearance. He is well-developed. He is ill-appearing.  HENT:     Head: Normocephalic and atraumatic.     Nose: No congestion.     Mouth/Throat:     Mouth: Mucous membranes are moist.     Pharynx: Oropharynx is clear.  Eyes:     General: No scleral icterus.    Conjunctiva/sclera: Conjunctivae normal.  Cardiovascular:     Rate and Rhythm: Normal rate and regular rhythm.     Heart sounds: Normal heart sounds.  Pulmonary:     Effort: Pulmonary effort is normal. No respiratory distress.     Breath sounds: Examination of the right-upper field reveals wheezing. Examination of the left-lower field reveals rales. Wheezing and rales present.  Abdominal:     Palpations: Abdomen is soft.  Musculoskeletal:     Cervical back: Neck supple.  Skin:    General: Skin is warm and dry.     Capillary Refill: Capillary refill takes less than 2 seconds.  Neurological:     General: No focal deficit present.     Mental Status: He is alert. Mental status is at baseline.     Motor: No weakness.     Gait: Gait normal.  Psychiatric:        Mood  and Affect: Mood normal.        Behavior: Behavior normal.      UC Treatments / Results  Labs (all labs ordered are listed, but only abnormal results are displayed) Labs Reviewed - No data to display  EKG   Radiology DG Chest 2 View  Result Date: 06/17/2022 CLINICAL DATA:  Wheezing.  Cough and congestion for 1 week. EXAM: CHEST - 2 VIEW COMPARISON:  Chest two views 04/21/2019 and 10/19/2017, CTA chest 04/21/2019 FINDINGS: Cardiac silhouette and mediastinal contours are within limits. There is again mild flattening of the diaphragms. Horizontal linear scarring is again seen within the left lower lung, unchanged from multiple prior radiographs and prior CT chest. No pleural effusion pneumothorax. Moderate multilevel degenerative disc changes of the thoracic spine. There is again increased space between the left seventh and eighth ribs with downward angulation of the lateral aspect of the left eighth rib approaching the left ninth rib, unchanged from prior CT and likely representing the sequela of remote trauma. IMPRESSION: 1. No active cardiopulmonary disease. 2. Chronic scarring of the left lower lung. Electronically Signed   By: Yvonne Kendall M.D.   On: 06/17/2022 12:29    Procedures Procedures (including critical care time)  Medications Ordered in UC Medications  ipratropium-albuterol (DUONEB) 0.5-2.5 (3) MG/3ML nebulizer solution 3 mL (3 mLs Nebulization Given 06/17/22 1151)    Initial Impression / Assessment and Plan / UC Course  I have reviewed the triage vital signs and the nursing notes.  Pertinent labs & imaging results that were available during my care of the  patient were reviewed by me and considered in my medical decision making (see chart for details).   82 year old male with history of COPD/asthma, diabetes, hypertension, hyperlipidemia, coronary artery disease and previous MI presents for cough, congestion, wheezing and shortness of breath x 1 week.  He is afebrile.  He  is ill-appearing but nontoxic.  He is not in any distress but is breathing heavily and has audible wheezing.  Patient given DuoNeb treatment.  Chest x-ray obtained to assess for possible pneumonia.  Radiologist does not interpret x-ray for any evidence of pneumonia but I have concerns about the left lower lung.  Radiologist believes there is left scarring in this area.  On exam I do hear rales.  Will treat him for possible community-acquired pneumonia.  He has a lot of comorbidities and allergies so I am treating him with Levaquin.  Also sent prednisone, Promethazine DM, and refilled albuterol nebulizer solution.  Advise close monitoring, rest and fluids and follow-up with PCP.  ED precautions discussed.  Final Clinical Impressions(s) / UC Diagnoses   Final diagnoses:  Pneumonia of left lower lobe due to infectious organism  COPD exacerbation ([place])  Acute cough  Shortness of breath     Discharge Instructions      -Evidence of pneumonia. - I have sent antibiotics to pharmacy as well as corticosteroids, nebulizer solution and cough medication. - Increase your rest and fluid intake. - If your breathing worsens he should go to the emergency department, otherwise follow-up with your PCP.     ED Prescriptions     Medication Sig Dispense Auth. Provider   albuterol (PROVENTIL) (2.5 MG/3ML) 0.083% nebulizer solution Take 3 mLs (2.5 mg total) by nebulization every 6 (six) hours as needed for wheezing or shortness of breath. 75 mL Laurene Footman B, PA-C   promethazine-dextromethorphan (PROMETHAZINE-DM) 6.25-15 MG/5ML syrup Take 5 mLs by mouth 4 (four) times daily as needed. 118 mL Laurene Footman B, PA-C   predniSONE (DELTASONE) 20 MG tablet Take 2 tablets (40 mg total) by mouth daily for 5 days. 10 tablet Laurene Footman B, PA-C   levofloxacin (LEVAQUIN) 750 MG tablet Take 1 tablet (750 mg total) by mouth daily for 5 days. 5 tablet Gretta Cool      PDMP not reviewed this encounter.    Danton Clap, PA-C 06/17/22 1238

## 2022-06-17 NOTE — Discharge Instructions (Addendum)
-  Evidence of pneumonia. - I have sent antibiotics to pharmacy as well as corticosteroids, nebulizer solution and cough medication. - Increase your rest and fluid intake. - If your breathing worsens he should go to the emergency department, otherwise follow-up with your PCP.

## 2022-06-17 NOTE — ED Triage Notes (Signed)
Pt c/o Possible URI. Pt has had cough, congestion, Wheezing, and SOB x1week

## 2022-06-19 ENCOUNTER — Telehealth: Payer: Self-pay | Admitting: Emergency Medicine

## 2022-06-19 ENCOUNTER — Telehealth: Payer: Self-pay

## 2022-06-19 MED ORDER — DOXYCYCLINE HYCLATE 100 MG PO CAPS: 100.0000 mg | ORAL_CAPSULE | Freq: Two times a day (BID) | ORAL | 0 refills | Status: AC

## 2022-06-19 MED ORDER — EMPAGLIFLOZIN 10 MG TABLET
ORAL_TABLET | Freq: Every day | ORAL | 3 refills | 90 days
Start: 2022-06-19 — End: ?

## 2022-06-19 NOTE — Telephone Encounter (Signed)
Patient was seen 3 days ago and diagnosed with pneumonia and discharged home on Levaquin.  He called in to say that he is having significant pain in his hamstrings.  I believe that the Levaquin is causing his muscle pain and I will have him stop that and switch him to doxycycline 100 mg twice daily.  Since he has had 2 days worth of Levaquin I will only prescribe an 8-day course of the doxycycline.

## 2022-06-19 NOTE — Telephone Encounter (Signed)
Pt calls in regarding muscle pain in hamstrings since he started the medications we gave him.  Charmayne Sheer FNP prescribes new prescription and told to stop the Levaquin.  Pt verbalized understanding.

## 2022-06-22 MED ORDER — INSULIN SYRINGE-NEEDLE U-100 HALF UNIT MARKING 0.5ML 31 GAUGE X 15/64" (6 MM)
3 refills | 0 days | Status: CP
Start: 2022-06-22 — End: ?

## 2022-06-22 MED ORDER — EMPAGLIFLOZIN 10 MG TABLET
ORAL_TABLET | Freq: Every day | ORAL | 1 refills | 30.00000 days | Status: CP
Start: 2022-06-22 — End: 2022-06-22

## 2022-06-22 MED ORDER — INSULIN HUMAN U-100 NPH-REGULR 70-30 MIX 100 UNIT/ML SUBCUTANEOUS SUSP
3 refills | 0 days | Status: CP
Start: 2022-06-22 — End: ?

## 2022-06-26 ENCOUNTER — Ambulatory Visit
Admission: RE | Admit: 2022-06-26 | Discharge: 2022-06-26 | Disposition: A | Discharge status: Home / Self Care | Payer: Medicare HMO | Source: Ambulatory Visit | Attending: Urology | Admitting: Urology

## 2022-06-26 DIAGNOSIS — R310 Gross hematuria: Secondary | ICD-10-CM | POA: Insufficient documentation

## 2022-06-26 LAB — POCT I-STAT CREATININE: Creatinine, Ser: 1.2 mg/dL (ref 0.61–1.24)

## 2022-06-26 MED ORDER — IOHEXOL 300 MG/ML  SOLN
125.0000 mL | Freq: Once | INTRAMUSCULAR | Status: AC | PRN
  Administered 2022-06-26: 125 mL via INTRAVENOUS

## 2022-07-18 ENCOUNTER — Encounter: Payer: Self-pay | Admitting: Urology

## 2022-07-18 ENCOUNTER — Ambulatory Visit (INDEPENDENT_AMBULATORY_CARE_PROVIDER_SITE_OTHER): Payer: Medicare HMO | Admitting: Urology

## 2022-07-18 VITALS — BP 151/87 | HR 93 | Ht 70.0 in | Wt 212.0 lb

## 2022-07-18 DIAGNOSIS — N200 Calculus of kidney: Secondary | ICD-10-CM

## 2022-07-18 DIAGNOSIS — R310 Gross hematuria: Secondary | ICD-10-CM | POA: Diagnosis not present

## 2022-07-18 DIAGNOSIS — N399 Disorder of urinary system, unspecified: Secondary | ICD-10-CM

## 2022-07-18 DIAGNOSIS — Z8551 Personal history of malignant neoplasm of bladder: Secondary | ICD-10-CM

## 2022-07-18 LAB — MICROSCOPIC EXAMINATION

## 2022-07-18 LAB — URINALYSIS, COMPLETE
Bilirubin, UA: NEGATIVE
Glucose, UA: NEGATIVE
Ketones, UA: NEGATIVE
Leukocytes,UA: NEGATIVE
Nitrite, UA: NEGATIVE
RBC, UA: NEGATIVE
Specific Gravity, UA: <1.005 — ABNORMAL LOW (ref 1.005–1.030)
Urobilinogen, Ur: 0.2 mg/dL (ref 0.2–1.0)
pH, UA: 5.5 (ref 5.0–7.5)

## 2022-07-18 NOTE — Progress Notes (Signed)
    01/10/22  CC:  Chief Complaint  Patient presents with   Cysto    HPI: Dustin Blackburn is a 82 y.o. male  who returns today for cysto/stent removal s/p cystoscopy w/ bladder bx, Urolift, ureteroscopy/lithotripsy, and remote history of bladder cancer who presents today for surveillance cystoscopy.   He was initially found to have incidental bladder wall thickening on CT abdomen pelvis on 03/2016. Cystoscopy showed a papillary proximal and 1 cm tumor at the dome of the bladder. He was taken to the operating room on 06/12/2016 for TURBT, bilateral retrograde, and instillation of mitomycin.   Surgical pathology was consistent with low-grade, noninvasive tumor.   He returned to the operating room on 10/02/2017 for cystoscopy, bladder biopsy, bilateral retrograde pyelograms secondary to bladder erythema.    Surgical pathology was benign with evidence of squamous metaplasia and reactive changes, scarlike fibrosis, but no evidence of malignancy or dysplasia.   He underwent another bladder biopsy for bladder erythema in 10/2019 at the time of ureteroscopy.  This was consistent with mucosal edema and mixed inflammation but no malignancy.   Notably, he also has a personal history of kidney stones.  He transitioned his urologic care to [facility].    He returns today for cystoscopy sooner than scheduled in the setting of gross hematuria.  He underwent repeat CT urogram which was completed on 06/26/2022 which shows an 11 mm left interpolar stone.  Also notable was an apparent mild ill defined wall thickening on the interpolar calyces of the right kidney unclear significance, inflammatory versus neoplastic.  I personally reviewed the study, not appreciated on axial but rather coronal and it is very subtle.  Denies any current flank pain.  Urinalysis today is negative.    Vitals:   07/18/22 1357  BP: (!) 151/87  Pulse: 93   NED. A&Ox3.   No respiratory distress   Abd soft, NT, ND Normal phallus with  bilateral descended testicles  Cystoscopy Procedure Note  Patient identification was confirmed, informed consent was obtained, and patient was prepped using Betadine solution.  Lidocaine jelly was administered per urethral meatus.     Pre-Procedure: - Inspection reveals a normal caliber ureteral meatus.  Procedure: The flexible cystoscope was introduced without difficulty - No urethral strictures/lesions are present. - Enlarged prostate bilobar coaptation  - Elevated bladder neck - Bilateral ureteral orifices identified - Bladder mucosa unremarkable today with a few stellate scars - No bladder stones - No trabeculation  Retroflexion shows NED   Post-Procedure: - Patient tolerated the procedure well   Assessment/ Plan:  1.  Bladder cancer - No evidence of disease today, will continue to follow on annual basis with cystoscopy   2. Kidney stone -Asymptomatic, nonobstructing -See #3, if we end up proceeding with diagnostic ureteroscopy for this issue, could consider addressing the stone on that occasion  3.  Right upper tract urothelial abnormality -Very subtle and nonspecific without any obvious tumor -Could be inflammatory or early neoplastic -Recommended repeating CT urogram in about 3 to 4 months, follow-up thereafter.  If it persist or worsens, would recommend diagnostic ureteroscopy.  He is agreeable this plan.  In the interim, if he has any gross hematuria, flank pain, or UTI symptoms, who presents sooner.  Follow-up in 4 months with CT urogram prior  Hollice Espy, MD

## 2022-07-26 ENCOUNTER — Ambulatory Visit: Admit: 2022-07-26 | Discharge: 2022-07-27 | Payer: MEDICARE

## 2022-07-27 MED ORDER — FLUTICASONE FUR. 100 MCG-UMECLID 62.5 MCG-VILANT 25 MCG INHALAT.POWDER
Freq: Every day | RESPIRATORY_TRACT | 1 refills | 28 days | Status: CP
Start: 2022-07-27 — End: 2022-09-21

## 2022-07-27 MED ORDER — PANTOPRAZOLE 40 MG TABLET,DELAYED RELEASE
ORAL_TABLET | Freq: Two times a day (BID) | ORAL | 1 refills | 30 days | Status: CP
Start: 2022-07-27 — End: 2022-09-25

## 2022-07-27 MED ORDER — ALBUTEROL SULFATE 2.5 MG/3 ML (0.083 %) SOLUTION FOR NEBULIZATION
Freq: Four times a day (QID) | RESPIRATORY_TRACT | 1 refills | 25 days | Status: CP | PRN
Start: 2022-07-27 — End: 2022-09-25

## 2022-07-28 MED ORDER — LORATADINE 10 MG TABLET
ORAL_TABLET | Freq: Every day | ORAL | 11 refills | 30 days | Status: CP
Start: 2022-07-28 — End: 2023-07-28

## 2022-07-28 MED ORDER — PREDNISONE 20 MG TABLET
ORAL_TABLET | Freq: Every day | ORAL | 0 refills | 4 days | Status: CP
Start: 2022-07-28 — End: 2022-08-01

## 2022-08-07 ENCOUNTER — Ambulatory Visit
Admit: 2022-08-07 | Discharge: 2022-08-08 | Payer: MEDICARE | Attending: Student in an Organized Health Care Education/Training Program | Primary: Student in an Organized Health Care Education/Training Program

## 2022-08-07 DIAGNOSIS — R0683 Snoring: Principal | ICD-10-CM

## 2022-08-07 DIAGNOSIS — J449 Chronic obstructive pulmonary disease, unspecified: Principal | ICD-10-CM

## 2022-08-15 ENCOUNTER — Ambulatory Visit: Admit: 2022-08-15 | Discharge: 2022-08-16 | Payer: MEDICARE | Attending: Internal Medicine | Primary: Internal Medicine

## 2022-08-15 DIAGNOSIS — M539 Dorsopathy, unspecified: Principal | ICD-10-CM

## 2022-08-15 DIAGNOSIS — K219 Gastro-esophageal reflux disease without esophagitis: Principal | ICD-10-CM

## 2022-08-15 DIAGNOSIS — Z794 Long term (current) use of insulin: Principal | ICD-10-CM

## 2022-08-15 DIAGNOSIS — R2689 Other abnormalities of gait and mobility: Principal | ICD-10-CM

## 2022-08-15 DIAGNOSIS — Z9181 History of falling: Principal | ICD-10-CM

## 2022-08-15 DIAGNOSIS — E1142 Type 2 diabetes mellitus with diabetic polyneuropathy: Principal | ICD-10-CM

## 2022-08-15 MED ORDER — EMPAGLIFLOZIN 10 MG TABLET
ORAL_TABLET | Freq: Every day | ORAL | 5 refills | 30 days | Status: CP
Start: 2022-08-15 — End: 2023-08-15

## 2022-08-15 MED ORDER — RESP SYNCYTIAL VIRUS VAC, PREF A AND B (PF) 120 MCG/0.5 ML IM SOLUTION
Freq: Once | INTRAMUSCULAR | 0 refills | 1 days | Status: CN
Start: 2022-08-15 — End: 2022-08-15

## 2022-08-15 MED ORDER — PANTOPRAZOLE 40 MG TABLET,DELAYED RELEASE
ORAL_TABLET | Freq: Two times a day (BID) | ORAL | 2 refills | 90 days | Status: CP
Start: 2022-08-15 — End: 2022-10-14

## 2022-08-31 ENCOUNTER — Ambulatory Visit: Admit: 2022-08-31 | Payer: MEDICARE

## 2022-08-31 ENCOUNTER — Ambulatory Visit
Admit: 2022-08-31 | Payer: MEDICARE | Attending: Student in an Organized Health Care Education/Training Program | Primary: Student in an Organized Health Care Education/Training Program

## 2022-10-02 MED ORDER — FLUTICASONE FUR. 100 MCG-UMECLID 62.5 MCG-VILANT 25 MCG INHALAT.POWDER
Freq: Every day | RESPIRATORY_TRACT | 3 refills | 90 days | Status: CP
Start: 2022-10-02 — End: 2023-09-27

## 2022-10-24 ENCOUNTER — Ambulatory Visit
Admission: RE | Admit: 2022-10-24 | Discharge: 2022-10-24 | Disposition: A | Discharge status: Home / Self Care | Payer: Medicare HMO | Source: Ambulatory Visit | Attending: Urology | Admitting: Urology

## 2022-10-24 DIAGNOSIS — R310 Gross hematuria: Secondary | ICD-10-CM | POA: Diagnosis present

## 2022-10-24 LAB — POCT I-STAT CREATININE: Creatinine, Ser: 1.2 mg/dL (ref 0.61–1.24)

## 2022-10-24 MED ORDER — IOHEXOL 300 MG/ML  SOLN
125.0000 mL | Freq: Once | INTRAMUSCULAR | Status: AC | PRN
  Administered 2022-10-24: 125 mL via INTRAVENOUS

## 2022-10-26 ENCOUNTER — Ambulatory Visit: Admit: 2022-10-26 | Payer: MEDICARE

## 2022-11-08 ENCOUNTER — Ambulatory Visit: Admit: 2022-11-08 | Discharge: 2022-11-08 | Payer: MEDICARE

## 2022-11-08 ENCOUNTER — Ambulatory Visit
Admit: 2022-11-08 | Discharge: 2022-11-08 | Payer: MEDICARE | Attending: Student in an Organized Health Care Education/Training Program | Primary: Student in an Organized Health Care Education/Training Program

## 2022-11-08 DIAGNOSIS — J449 Chronic obstructive pulmonary disease, unspecified: Principal | ICD-10-CM

## 2022-11-15 ENCOUNTER — Ambulatory Visit: Payer: Medicare HMO | Admitting: Urology

## 2022-11-15 VITALS — BP 138/76 | HR 86 | Ht 70.0 in | Wt 212.0 lb

## 2022-11-15 DIAGNOSIS — Z8551 Personal history of malignant neoplasm of bladder: Secondary | ICD-10-CM | POA: Diagnosis not present

## 2022-11-15 DIAGNOSIS — R310 Gross hematuria: Secondary | ICD-10-CM

## 2022-11-15 DIAGNOSIS — N200 Calculus of kidney: Secondary | ICD-10-CM

## 2022-11-15 LAB — URINALYSIS, COMPLETE
Bilirubin, UA: NEGATIVE
Leukocytes,UA: NEGATIVE
Nitrite, UA: NEGATIVE
Protein,UA: NEGATIVE
Specific Gravity, UA: 1.01 (ref 1.005–1.030)
Urobilinogen, Ur: 0.2 mg/dL (ref 0.2–1.0)
pH, UA: 5.5 (ref 5.0–7.5)

## 2022-11-15 LAB — MICROSCOPIC EXAMINATION

## 2022-11-15 NOTE — Progress Notes (Signed)
Marcelle Overlie Plume,acting as a scribe for Vanna Scotland, MD.,have documented all relevant documentation on the behalf of Vanna Scotland, MD,as directed by  Vanna Scotland, MD while in the presence of Vanna Scotland, MD.  11/15/2022 1:52 PM   Nicoletta Dress Oct 20, 1940 [id number]  Referring provider: Olena Leatherwood, FNP [address] [place],  [place] [address]  Chief Complaint  Patient presents with   Cysto    HPI: 82 year-old male who returns today for a follow up. He has a personal history of kidney stones, BPH, and a remote history of bladder cancer.   He was initially found to have incidental bladder wall thickening on CT abdomen pelvis on 03/2016. Cystoscopy showed a papillary proximal and 1 cm tumor at the dome of the bladder. He was taken to the operating room on 06/12/2016 for TURBT, bilateral retrograde, and instillation of mitomycin.   Surgical pathology was consistent with low-grade, noninvasive tumor.   He returned to the operating room on 10/02/2017 for cystoscopy, bladder biopsy, bilateral retrograde pyelograms secondary to bladder erythema.    Surgical pathology was benign with evidence of squamous metaplasia and reactive changes, scarlike fibrosis, but no evidence of malignancy or dysplasia.   He underwent another bladder biopsy for bladder erythema in 10/2019 at the time of ureteroscopy.  This was consistent with mucosal edema and mixed inflammation but no malignancy.  He underwent a cystoscopy in 06/2022 because of an episode of gross hematuria. He had a CT urogram on 06/26/2022 that showed a 11 mm left interpolar stone, but there as also an ill defined left wall thickening of the interpolar calyces on the right side of unclear significance. He returns today with repeat CT urogram to reevaluate this area. I personally reviewed this study completed on 10/29/2022. A side-by-side comparison of the ill-defined area is now resolved.   A urinalysis today shows persistent microscopic  blood in his urine, 11-30 RBC's per high power field.    PMH: Past Medical History:  Diagnosis Date   Arthritis    Asthma    Cancer (HCC) 2018   bladder   Cataracts, bilateral    Cold    COPD (chronic obstructive pulmonary disease) (HCC)    Coronary artery disease    DDD (degenerative disc disease), cervical    Depression    Diabetes mellitus without complication (HCC)    Dyspnea    GERD (gastroesophageal reflux disease)    Hearing loss associated with syndrome of both ears    Heart attack (HCC) 2016   stented x 1   History of aspiration pneumonitis    History of kidney stones    Hyperlipidemia    Kidney cysts    Kidney stones    Osteoarthritis of back    Psoriasis    Psoriasis    Seasonal allergies    Wears dentures    full upper    Surgical History: Past Surgical History:  Procedure Laterality Date   CARDIAC CATHETERIZATION  2016 and 2018   CATARACT EXTRACTION W/PHACO Left 05/27/2020   Procedure: CATARACT EXTRACTION PHACO AND INTRAOCULAR LENS PLACEMENT (IOC) LEFT DIABETIC;  Surgeon: Nevada Crane, MD;  Location: [facility];  Service: Ophthalmology;  Laterality: Left;  3.73 0:38.1   CATARACT EXTRACTION W/PHACO Right 06/21/2020   Procedure: CATARACT EXTRACTION PHACO AND INTRAOCULAR LENS PLACEMENT (IOC) RIGHT DIABETIC;  Surgeon: Nevada Crane, MD;  Location: [facility];  Service: Ophthalmology;  Laterality: Right;  3.20 0:30.5   COLONOSCOPY  COLONOSCOPY WITH PROPOFOL N/A 09/18/2017   Procedure: COLONOSCOPY WITH PROPOFOL;  Surgeon: Christena Deem, MD;  Location: [facility] ENDOSCOPY;  Service: Endoscopy;  Laterality: N/A;   CORONARY ANGIOPLASTY  2016   CORONARY STENT PLACEMENT  2016   CYSTOSCOPY W/ RETROGRADES Bilateral 06/12/2016   Procedure: CYSTOSCOPY WITH RETROGRADE PYELOGRAM;  Surgeon: Vanna Scotland, MD;  Location: [facility] ORS;  Service: Urology;  Laterality: Bilateral;   CYSTOSCOPY W/ RETROGRADES Bilateral 10/02/2017   Procedure:  CYSTOSCOPY WITH RETROGRADE PYELOGRAM;  Surgeon: Vanna Scotland, MD;  Location: [facility];  Service: Urology;  Laterality: Bilateral;   CYSTOSCOPY WITH BIOPSY N/A 10/02/2017   Procedure: CYSTOSCOPY WITH Bladder BIOPSY;  Surgeon: Vanna Scotland, MD;  Location: [facility];  Service: Urology;  Laterality: N/A;   CYSTOSCOPY WITH BIOPSY N/A 11/10/2019   Procedure: CYSTOSCOPY WITH Bladder BIOPSY;  Surgeon: Vanna Scotland, MD;  Location: [facility];  Service: Urology;  Laterality: N/A;   CYSTOSCOPY WITH INSERTION OF UROLIFT N/A 11/10/2019   Procedure: CYSTOSCOPY WITH INSERTION OF UROLIFT;  Surgeon: Vanna Scotland, MD;  Location: [facility];  Service: Urology;  Laterality: N/A;   CYSTOSCOPY WITH STENT PLACEMENT Right 11/10/2019   Procedure: CYSTOSCOPY WITH STENT PLACEMENT;  Surgeon: Vanna Scotland, MD;  Location: [facility];  Service: Urology;  Laterality: Right;   CYSTOSCOPY WITH URETEROSCOPY Right 10/02/2017   Procedure: DIAGNOSTIC RIGHT URETEROSCOPY;  Surgeon: Vanna Scotland, MD;  Location: [facility];  Service: Urology;  Laterality: Right;   ESOPHAGOGASTRODUODENOSCOPY (EGD) WITH PROPOFOL N/A 09/18/2017   Procedure: ESOPHAGOGASTRODUODENOSCOPY (EGD) WITH PROPOFOL;  Surgeon: Christena Deem, MD;  Location: [facility] ENDOSCOPY;  Service: Endoscopy;  Laterality: N/A;   fatty tumors removed     HIATAL HERNIA REPAIR  1992   LIPOMA EXCISION     right leg   NASOPHARYNGOSCOPY EUSTATION TUBE BALLOON DILATION Bilateral 06/13/2018   Procedure: NASOPHARYNGOSCOPY EUSTATION TUBE BALLOON DILATION DIABETIC, NEED EUSTATION TUBE BALLOON SYSTEM;  Surgeon: Vernie Murders, MD;  Location: [facility];  Service: ENT;  Laterality: Bilateral;   RIGHT/LEFT HEART CATH AND CORONARY ANGIOGRAPHY N/A 12/21/2016   Procedure: Right/Left Heart Cath and Coronary Angiography;  Surgeon: Marcina Millard, MD;  Location: [facility] INVASIVE CV LAB;  Service: Cardiovascular;  Laterality: N/A;   SINUS EXPLORATION  1992   TRANSURETHRAL RESECTION OF  BLADDER TUMOR WITH MITOMYCIN-C N/A 06/12/2016   Procedure: TRANSURETHRAL RESECTION OF BLADDER TUMOR WITH MITOMYCIN-C;  Surgeon: Vanna Scotland, MD;  Location: [facility];  Service: Urology;  Laterality: N/A;   TURBINATE REDUCTION Bilateral 06/13/2018   Procedure: Salvatore Decent;  Surgeon: Vernie Murders, MD;  Location: [facility];  Service: ENT;  Laterality: Bilateral;  Diabetic - insulin   UMBILICAL HERNIA REPAIR N/A 06/12/2016   Procedure: HERNIA REPAIR UMBILICAL ADULT;  Surgeon: Kieth Brightly, MD;  Location: [facility];  Service: General;  Laterality: N/A;   URETEROSCOPY Right 06/12/2016   Procedure: URETEROSCOPY;  Surgeon: Vanna Scotland, MD;  Location: [facility];  Service: Urology;  Laterality: Right;   URETEROSCOPY WITH HOLMIUM LASER LITHOTRIPSY Right 11/10/2019   Procedure: URETEROSCOPY WITH HOLMIUM LASER LITHOTRIPSY;  Surgeon: Vanna Scotland, MD;  Location: [facility];  Service: Urology;  Laterality: Right;    Home Medications:  Allergies as of 11/15/2022       Reactions   Actos [pioglitazone] Palpitations   Pt reported a MI    Glimepiride Rash   Shortness of breath also   Glipizide Shortness Of Breath   Metformin Shortness Of Breath   Pt states he "can't breathe and it gives him sinus  congestion".   Levofloxacin Other (See Comments)   States it "attacked my joints" and couldn't walk.   Versed [midazolam]    Over-sedation.  Hard to clear from system.   Amoxicillin Nausea Only   Erythromycin Base Nausea Only   Esomeprazole Magnesium Other (See Comments)   Makes patients heart burn worse   Lipitor [atorvastatin]    indigestion   Liraglutide Other (See Comments)   bloating   Oxycodone Nausea Only   Pt states he was severely and acutely nauseated and bloated  Pt states he was severely and acutely nauseated and bloated   Penicillins Nausea Only   Has patient had a PCN reaction causing immediate rash, facial/tongue/throat swelling, SOB or lightheadedness with  hypotension: no Has patient had a PCN reaction causing severe rash involving mucus membranes or skin necrosis: no Has patient had a PCN reaction that required hospitalization:no Has patient had a PCN reaction occurring within the last 10 years: no If all of the above answers are "NO", then may proceed with Cephalosporin use.   Sitagliptin Other (See Comments)   Pt states it "gives him joint pain" & redness        Medication List        Accurate as of November 15, 2022  1:52 PM. If you have any questions, ask your nurse or doctor.          STOP taking these medications    albuterol (2.5 MG/3ML) 0.083% nebulizer solution Commonly known as: PROVENTIL   albuterol 108 (90 Base) MCG/ACT inhaler Commonly known as: VENTOLIN HFA       TAKE these medications    clobetasol cream 0.05 % Commonly known as: TEMOVATE Apply 1 application topically 2 (two) times daily as needed (psoriasis).   DICLOFENAC PO Take by mouth as needed.   fluocinonide cream 0.05 % Commonly known as: LIDEX Apply 1 application topically as needed.   fluticasone 50 MCG/ACT nasal spray Commonly known as: FLONASE Place into both nostrils daily as needed for allergies or rhinitis.   Fluticasone-Umeclidin-Vilant 100-62.5-25 MCG/ACT Aepb Inhale into the lungs.   hydrocortisone 5 MG tablet Commonly known as: CORTEF Take by mouth.   loratadine 10 MG tablet Commonly known as: CLARITIN Take 10 mg by mouth daily.   oxybutynin 5 MG tablet Commonly known as: DITROPAN Take by mouth.   pantoprazole 40 MG tablet Commonly known as: PROTONIX Take 40 mg by mouth 2 (two) times daily.   Potassium Citrate 15 MEQ (1620 MG) Tbcr Take 2 tablets by mouth 2 (two) times daily with a meal.   promethazine-dextromethorphan 6.25-15 MG/5ML syrup Commonly known as: PROMETHAZINE-DM Take 5 mLs by mouth 4 (four) times daily as needed.   ReliOn Pen Needles 31G X 6 MM Misc Generic drug: Insulin Pen Needle   Symbicort  160-4.5 MCG/ACT inhaler Generic drug: budesonide-formoterol Inhale 2 puffs into the lungs 2 (two) times daily as needed for shortness of breath.   testosterone 50 MG/5GM (1%) Gel Commonly known as: ANDROGEL Place 5 g onto the skin daily as needed.   Testosterone 20.25 MG/1.25GM (1.62%) Gel Use one packet, 20.25mg  daily.  Apply to shoulder        Allergies:  Allergies  Allergen Reactions   Actos [Pioglitazone] Palpitations    Pt reported a MI    Glimepiride Rash    Shortness of breath also   Glipizide Shortness Of Breath   Metformin Shortness Of Breath    Pt states he "can't breathe and it gives him sinus congestion".  Levofloxacin Other (See Comments)    States it "attacked my joints" and couldn't walk.   Versed [Midazolam]     Over-sedation.  Hard to clear from system.   Amoxicillin Nausea Only   Erythromycin Base Nausea Only   Esomeprazole Magnesium Other (See Comments)    Makes patients heart burn worse   Lipitor [Atorvastatin]     indigestion   Liraglutide Other (See Comments)    bloating   Oxycodone Nausea Only    Pt states he was severely and acutely nauseated and bloated   Pt states he was severely and acutely nauseated and bloated   Penicillins Nausea Only    Has patient had a PCN reaction causing immediate rash, facial/tongue/throat swelling, SOB or lightheadedness with hypotension: no Has patient had a PCN reaction causing severe rash involving mucus membranes or skin necrosis: no Has patient had a PCN reaction that required hospitalization:no Has patient had a PCN reaction occurring within the last 10 years: no If all of the above answers are "NO", then may proceed with Cephalosporin use.    Sitagliptin Other (See Comments)    Pt states it "gives him joint pain" & redness     Family History: Family History  Problem Relation Age of Onset   Cancer Mother    Prostate cancer Maternal Uncle    Prostate cancer Paternal Uncle    Prostate cancer Cousin     Congestive Heart Failure Father    Kidney cancer Neg Hx    Bladder Cancer Neg Hx     Social History:  reports that he quit smoking about 32 years ago. His smoking use included cigarettes. He has been exposed to tobacco smoke. He quit smokeless tobacco use about 32 years ago.  His smokeless tobacco use included chew. He reports that he does not currently use alcohol. He reports that he does not use drugs.   Physical Exam: BP 138/76   Pulse 86   Ht 5\' 10"  (1.778 m)   Wt 212 lb (96.2 kg)   BMI 30.42 kg/m   Constitutional:  Alert and oriented, No acute distress. HEENT: [place] AT, moist mucus membranes.  Trachea midline, no masses. Neurologic: Grossly intact, no focal deficits, moving all 4 extremities. Psychiatric: Normal mood and affect.  Pertinent Imaging:  EXAM: CT ABDOMEN AND PELVIS WITHOUT AND WITH CONTRAST   TECHNIQUE: Multidetector CT imaging of the abdomen and pelvis was performed following the standard protocol before and following the bolus administration of intravenous contrast.   RADIATION DOSE REDUCTION: This exam was performed according to the departmental dose-optimization program which includes automated exposure control, adjustment of the mA and/or kV according to patient size and/or use of iterative reconstruction technique.   CONTRAST:  OMNIPAQUE IOHEXOL 300 MG/ML  SOLN   COMPARISON:  CT stone study 07/20/2020   FINDINGS: Lower chest: Scarring in the lateral left lower lobe is stable.   Hepatobiliary: No suspicious focal abnormality within the liver parenchyma. There is no evidence for gallstones, gallbladder wall thickening, or pericholecystic fluid. No intrahepatic or extrahepatic biliary dilation.   Pancreas: No focal mass lesion. No dilatation of the main duct. No intraparenchymal cyst. No peripancreatic edema.   Spleen: No splenomegaly. No focal mass lesion.   Adrenals/Urinary Tract: No adrenal nodule or mass. Pre contrast imaging shows  no stones in the right kidney or ureter. 11 x 7 x 12 mm non obstructing stone is identified posterior interpolar left kidney. No left ureteral stone. No bladder stone. No secondary changes in  either kidney or ureter.   Imaging after IV contrast administration shows multiple simple cysts in both kidneys measuring up to 3.8 cm in the anterior interpolar right kidney and dominant 7.8 cm exophytic cyst upper pole left kidney. Although some of the low-density renal lesions are too small to characterize, there is no overtly suspicious enhancing lesion evident in either kidney.   Delayed post-contrast imaging shows apparent mild ill-defined wall thickening in interpolar calices of the right kidney, not well demonstrated on axial imaging but visible on coronal image 83 of series 20. Both ureters are well opacified without evidence for wall thickening, soft tissue lesion or focal dilatation. Delayed imaging of the bladder shows no focal wall thickening or mass lesion.   Stomach/Bowel: Stomach is unremarkable. No gastric wall thickening. No evidence of outlet obstruction. Duodenum is normally positioned as is the ligament of Treitz. No small bowel wall thickening. No small bowel dilatation. The terminal ileum is normal. The appendix is normal. No gross colonic mass. No colonic wall thickening.   Vascular/Lymphatic: There is mild atherosclerotic calcification of the abdominal aorta without aneurysm. There is no gastrohepatic or hepatoduodenal ligament lymphadenopathy. No retroperitoneal or mesenteric lymphadenopathy. No pelvic sidewall lymphadenopathy.   Reproductive: Clips in the region the prostate gland may be related to prior Uro lift procedure.   Other: No intraperitoneal free fluid.   Musculoskeletal: No worrisome lytic or sclerotic osseous abnormality.   IMPRESSION: 1. 11 x 7 x 12 mm non obstructing stone posterior interpolar left kidney. 2. Apparent mild ill-defined wall  thickening in interpolar calices of the right kidney, not well demonstrated on axial imaging but visible on coronal imaging. This is nonspecific and may be related to infectious/inflammatory etiology. Urothelial neoplasm considered less likely but not excluded. Follow-up likely warranted. 3. Bilateral simple renal cysts. 4. No evidence for metastatic disease in the abdomen or pelvis. 5.  Aortic Atherosclerosis (ICD10-I70.0).     Electronically Signed   By: [name] M.D.   On: 06/26/2022 11:52  This was personally reviewed and I agree with the radiologic interpretation.   EXAM: CT ABDOMEN AND PELVIS WITHOUT AND WITH CONTRAST   TECHNIQUE: Multidetector CT imaging of the abdomen and pelvis was performed following the standard protocol before and following the bolus administration of intravenous contrast.   RADIATION DOSE REDUCTION: This exam was performed according to the departmental dose-optimization program which includes automated exposure control, adjustment of the mA and/or kV according to patient size and/or use of iterative reconstruction technique.   CONTRAST:  OMNIPAQUE IOHEXOL 300 MG/ML  SOLN   COMPARISON:  06/26/2022   FINDINGS: Lower chest: Subpleural scarring in the left lower lobe, unchanged.   Hepatobiliary: Liver is within normal limits.   Gallbladder is unremarkable. No intrahepatic or extrahepatic dilatation.   Pancreas: Within normal limits.   Spleen: Within normal limits.   Adrenals/Urinary Tract: Adrenal glands are within normal limits.   Multiple simple bilateral renal cysts, measuring up to 8.2 cm in the anterior left upper kidney (series 9/image 31), benign (Bosniak I). No enhancing renal lesions.   14 mm lateral left lower pole renal calculus (series 2/image 44). No ureteral or bladder calculi. No hydronephrosis.   On delayed imaging, there are no filling defects in the bilateral opacified proximal collecting systems, ureters,  or bladder.   Postsurgical changes in the left posterolateral bladder. Thick-walled bladder, although underdistended, without discrete mass on CT.   Stomach/Bowel: Stomach is notable for a tiny hiatal hernia.   No evidence of  bowel obstruction.   Normal appendix (series 9/image 51).   No colonic wall thickening or inflammatory changes.   Vascular/Lymphatic: No evidence of abdominal aortic aneurysm.   Atherosclerotic calcifications of the abdominal aorta and branch vessels.   No suspicious abdominopelvic lymphadenopathy.   Reproductive: Postsurgical changes along the anterior prostate.   Other: No abdominopelvic ascites.   Musculoskeletal: Degenerative changes of the visualized thoracolumbar spine.   IMPRESSION: Postsurgical changes in the left posterolateral bladder. Thick-walled bladder, although underdistended, without discrete mass on CT.   14 mm lateral left lower pole renal calculus. No ureteral or bladder calculi. No hydronephrosis.   Multiple simple bilateral renal cysts, measuring up to 8.2 cm in the anterior left upper kidney, benign (Bosniak I). No follow-up is recommended.     Electronically Signed   By: Charline Bills M.D.   On: 10/29/2022 03:20  This was personally reviewed and I agree with the radiologic interpretation.  Assessment & Plan:    1. Right upper tract urothelial abnormality - Resolved on follow up CT urogram - Was likely inflammatory/ infectious. F/u CT very reassuring  2. History of bladder cancer - Continue annual cystoscopy, he will be due in 06/2023  3. Kidney stone - Asymptomatic, non-obstructing, stable - Would not recommend any further evaluation  Return in about 1 year (around 11/15/2023) for repeat cystoscopy.  I have reviewed the above documentation for accuracy and completeness, and I agree with the above.   Vanna Scotland, MD   [facility] [address] [place], [place]  [address] [phone]

## 2022-12-25 ENCOUNTER — Ambulatory Visit: Admit: 2022-12-25 | Discharge: 2022-12-26 | Payer: MEDICARE | Attending: Internal Medicine | Primary: Internal Medicine

## 2022-12-25 ENCOUNTER — Ambulatory Visit
Admit: 2022-12-25 | Discharge: 2022-12-26 | Payer: MEDICARE | Attending: Student in an Organized Health Care Education/Training Program | Primary: Student in an Organized Health Care Education/Training Program

## 2022-12-25 DIAGNOSIS — I2510 Atherosclerotic heart disease of native coronary artery without angina pectoris: Principal | ICD-10-CM

## 2022-12-25 DIAGNOSIS — N500 Atrophy of testis: Principal | ICD-10-CM

## 2022-12-25 DIAGNOSIS — E236 Other disorders of pituitary gland: Principal | ICD-10-CM

## 2022-12-25 DIAGNOSIS — R5383 Other fatigue: Principal | ICD-10-CM

## 2022-12-25 DIAGNOSIS — E1140 Type 2 diabetes mellitus with diabetic neuropathy, unspecified: Principal | ICD-10-CM

## 2022-12-25 MED ORDER — LISINOPRIL 5 MG TABLET
ORAL_TABLET | Freq: Every day | ORAL | 11 refills | 30 days | Status: CP
Start: 2022-12-25 — End: 2023-12-25

## 2023-01-08 ENCOUNTER — Ambulatory Visit: Admit: 2023-01-08 | Discharge: 2023-01-09 | Payer: MEDICARE

## 2023-01-09 ENCOUNTER — Other Ambulatory Visit: Payer: Medicare HMO | Admitting: Urology

## 2023-01-21 ENCOUNTER — Ambulatory Visit: Admit: 2023-01-21 | Discharge: 2023-01-21 | Disposition: A | Discharge status: Home / Self Care | Payer: MEDICARE

## 2023-01-21 ENCOUNTER — Emergency Department: Admit: 2023-01-21 | Discharge: 2023-01-21 | Disposition: A | Discharge status: Home / Self Care | Payer: MEDICARE

## 2023-01-24 ENCOUNTER — Ambulatory Visit: Admit: 2023-01-24 | Discharge: 2023-01-25 | Payer: MEDICARE

## 2023-01-24 DIAGNOSIS — I10 Essential (primary) hypertension: Principal | ICD-10-CM

## 2023-01-24 DIAGNOSIS — R079 Chest pain, unspecified: Principal | ICD-10-CM

## 2023-01-24 DIAGNOSIS — I2510 Atherosclerotic heart disease of native coronary artery without angina pectoris: Principal | ICD-10-CM

## 2023-01-24 MED ORDER — NITROGLYCERIN 0.4 MG SUBLINGUAL TABLET
ORAL_TABLET | SUBLINGUAL | 0 refills | 1 days | Status: CP | PRN
Start: 2023-01-24 — End: 2024-01-24

## 2023-01-24 MED ORDER — EZETIMIBE 10 MG TABLET
ORAL_TABLET | Freq: Every day | ORAL | 3 refills | 90 days | Status: CP
Start: 2023-01-24 — End: 2024-01-24

## 2023-03-23 MED ORDER — SHINGRIX (PF) 50 MCG/0.5 ML INTRAMUSCULAR SUSPENSION, KIT
Freq: Once | INTRAMUSCULAR | 0 refills | 1 days
Start: 2023-03-23 — End: 2023-03-23

## 2023-03-26 ENCOUNTER — Ambulatory Visit: Admit: 2023-03-26 | Discharge: 2023-03-27 | Attending: Internal Medicine | Primary: Internal Medicine

## 2023-03-26 DIAGNOSIS — Z794 Long term (current) use of insulin: Principal | ICD-10-CM

## 2023-03-26 DIAGNOSIS — K219 Gastro-esophageal reflux disease without esophagitis: Principal | ICD-10-CM

## 2023-03-26 DIAGNOSIS — E1142 Type 2 diabetes mellitus with diabetic polyneuropathy: Principal | ICD-10-CM

## 2023-03-26 DIAGNOSIS — M51369 Degeneration of intervertebral disc of lumbar region, unspecified whether pain present: Principal | ICD-10-CM

## 2023-03-26 DIAGNOSIS — J410 Simple chronic bronchitis: Principal | ICD-10-CM

## 2023-03-26 DIAGNOSIS — J42 Unspecified chronic bronchitis: Principal | ICD-10-CM

## 2023-03-26 DIAGNOSIS — E662 Morbid (severe) obesity with alveolar hypoventilation: Principal | ICD-10-CM

## 2023-03-26 DIAGNOSIS — H903 Sensorineural hearing loss, bilateral: Principal | ICD-10-CM

## 2023-03-26 DIAGNOSIS — J302 Other seasonal allergic rhinitis: Principal | ICD-10-CM

## 2023-03-26 DIAGNOSIS — N500 Atrophy of testis: Principal | ICD-10-CM

## 2023-03-26 DIAGNOSIS — Z9181 History of falling: Principal | ICD-10-CM

## 2023-03-26 DIAGNOSIS — E236 Other disorders of pituitary gland: Principal | ICD-10-CM

## 2023-03-26 DIAGNOSIS — E66811 Class 1 obesity with alveolar hypoventilation and body mass index (BMI) of 32.0 to 32.9 in adult, unspecified whether serious comorbidity present (CMS-HCC): Principal | ICD-10-CM

## 2023-03-26 DIAGNOSIS — M7662 Achilles tendinitis, left leg: Principal | ICD-10-CM

## 2023-03-26 DIAGNOSIS — Z23 Encounter for immunization: Principal | ICD-10-CM

## 2023-03-26 DIAGNOSIS — Z6832 Body mass index (BMI) 32.0-32.9, adult: Principal | ICD-10-CM

## 2023-03-26 MED ORDER — SHINGRIX (PF) 50 MCG/0.5 ML INTRAMUSCULAR SUSPENSION, KIT
Freq: Once | INTRAMUSCULAR | 0 refills | 1 days | Status: CP
Start: 2023-03-26 — End: 2023-03-27
  Filled 2023-03-26: qty 0.5, 1d supply, fill #0

## 2023-05-07 ENCOUNTER — Ambulatory Visit
Admit: 2023-05-07 | Discharge: 2023-05-08 | Attending: Student in an Organized Health Care Education/Training Program | Primary: Student in an Organized Health Care Education/Training Program

## 2023-05-07 DIAGNOSIS — J449 Chronic obstructive pulmonary disease, unspecified: Principal | ICD-10-CM

## 2023-05-07 DIAGNOSIS — H903 Sensorineural hearing loss, bilateral: Principal | ICD-10-CM

## 2023-05-07 MED ORDER — BREZTRI AEROSPHERE 160 MCG-9MCG-4.8MCG/ACTUATION HFA AEROSOL INHALER
Freq: Two times a day (BID) | RESPIRATORY_TRACT | 12 refills | 0.00 days | Status: CP
Start: 2023-05-07 — End: 2023-05-07

## 2023-05-07 MED ORDER — GLYCOPYRROLATE 25 MCG/ML SOLUTION FOR NEBULIZATION-NEBULIZER ACCESSOR.
Freq: Two times a day (BID) | RESPIRATORY_TRACT | 3 refills | 0.00 days | Status: CP
Start: 2023-05-07 — End: 2023-05-07

## 2023-05-07 MED ORDER — BUDESONIDE 0.5 MG/2 ML SUSPENSION FOR NEBULIZATION
Freq: Two times a day (BID) | RESPIRATORY_TRACT | 3 refills | 30.00 days | Status: CP
Start: 2023-05-07 — End: 2023-05-07

## 2023-05-07 MED ORDER — FORMOTEROL WITH NEBULIZER 20 MCG/2 ML SOLUTION FOR NEBULIZATION
Freq: Two times a day (BID) | RESPIRATORY_TRACT | 3 refills | 0.00 days | Status: CP
Start: 2023-05-07 — End: 2023-05-07

## 2023-05-07 MED ORDER — YUPELRI 175 MCG/3 ML SOLUTION FOR NEBULIZATION
Freq: Every day | RESPIRATORY_TRACT | 3 refills | 0.00 days | Status: CP
Start: 2023-05-07 — End: 2023-05-07

## 2023-05-08 ENCOUNTER — Ambulatory Visit: Admit: 2023-05-08 | Discharge: 2023-05-09

## 2023-05-08 DIAGNOSIS — R42 Dizziness and giddiness: Principal | ICD-10-CM

## 2023-05-08 DIAGNOSIS — E785 Hyperlipidemia, unspecified: Principal | ICD-10-CM

## 2023-05-08 DIAGNOSIS — I2510 Atherosclerotic heart disease of native coronary artery without angina pectoris: Principal | ICD-10-CM

## 2023-05-08 DIAGNOSIS — R0609 Other forms of dyspnea: Principal | ICD-10-CM

## 2023-05-11 ENCOUNTER — Ambulatory Visit: Admit: 2023-05-11 | Discharge: 2023-05-12 | Payer: MEDICARE

## 2023-05-11 DIAGNOSIS — K225 Diverticulum of esophagus, acquired: Principal | ICD-10-CM

## 2023-05-11 DIAGNOSIS — K589 Irritable bowel syndrome without diarrhea: Principal | ICD-10-CM

## 2023-05-11 DIAGNOSIS — K219 Gastro-esophageal reflux disease without esophagitis: Principal | ICD-10-CM

## 2023-05-11 MED ORDER — BREZTRI AEROSPHERE 160 MCG-9MCG-4.8MCG/ACTUATION HFA AEROSOL INHALER
Freq: Two times a day (BID) | RESPIRATORY_TRACT | 12 refills | 0.00 days | Status: CP
Start: 2023-05-11 — End: 2023-05-11
  Filled 2023-05-16: qty 10.7, 30d supply, fill #0

## 2023-05-11 MED ORDER — PANTOPRAZOLE 20 MG TABLET,DELAYED RELEASE
ORAL_TABLET | Freq: Every day | ORAL | 8 refills | 90.00 days | Status: CP
Start: 2023-05-11 — End: 2025-07-29

## 2023-05-11 MED ORDER — RIFAXIMIN 550 MG TABLET
ORAL_TABLET | Freq: Three times a day (TID) | ORAL | 0 refills | 14.00 days | Status: CP
Start: 2023-05-11 — End: 2023-05-25

## 2023-05-14 MED ORDER — AEROCHAMBER MV SPACER
Freq: Once | 0 refills | 1.00 days | Status: CP
Start: 2023-05-14 — End: 2023-05-14

## 2023-05-16 ENCOUNTER — Ambulatory Visit: Admit: 2023-05-16 | Discharge: 2023-05-17

## 2023-05-16 DIAGNOSIS — R0609 Other forms of dyspnea: Principal | ICD-10-CM

## 2023-05-16 DIAGNOSIS — I2510 Atherosclerotic heart disease of native coronary artery without angina pectoris: Principal | ICD-10-CM

## 2023-05-16 MED FILL — OPTICHAMBER DIAMOND VHC SPACER: 30 days supply | Qty: 1 | Fill #0

## 2023-05-18 ENCOUNTER — Ambulatory Visit: Admit: 2023-05-18 | Discharge: 2023-05-19

## 2023-07-18 ENCOUNTER — Other Ambulatory Visit: Payer: Self-pay | Admitting: Urology

## 2023-08-06 ENCOUNTER — Ambulatory Visit: Admit: 2023-08-06 | Discharge: 2023-08-07 | Payer: MEDICARE | Attending: Adult Health | Primary: Adult Health

## 2023-08-06 DIAGNOSIS — R799 Abnormal finding of blood chemistry, unspecified: Principal | ICD-10-CM

## 2023-08-06 DIAGNOSIS — E785 Hyperlipidemia, unspecified: Principal | ICD-10-CM

## 2023-08-06 DIAGNOSIS — I2510 Atherosclerotic heart disease of native coronary artery without angina pectoris: Principal | ICD-10-CM

## 2023-08-06 DIAGNOSIS — Z789 Other specified health status: Principal | ICD-10-CM

## 2023-08-06 DIAGNOSIS — R0609 Other forms of dyspnea: Principal | ICD-10-CM

## 2023-08-06 MED ORDER — REPATHA SURECLICK 140 MG/ML SUBCUTANEOUS PEN INJECTOR
SUBCUTANEOUS | 11 refills | 0.00 days | Status: CP
Start: 2023-08-06 — End: ?

## 2023-08-07 DIAGNOSIS — Z789 Other specified health status: Principal | ICD-10-CM

## 2023-08-07 DIAGNOSIS — E785 Hyperlipidemia, unspecified: Principal | ICD-10-CM

## 2023-08-10 NOTE — Unmapped (Signed)
 [facility] has reached out to this patient via MyChart to onboard them to our Specialty Lite services for their Repatha. Their medication has never been filled.They will now receive proactive outreach from the pharmacy team for refills.    William Keller, PharmD  [facility] Pharmacist

## 2023-08-10 NOTE — Unmapped (Signed)
 [facility] Specialty Medication Onboarding    Specialty Medication: Repatha  Prior Authorization: Not Required   Financial Assistance: Yes - grant approved as secondary   Final Copay/Day Supply: $0 / 28 days     Insurance Restrictions: None     Notes to Pharmacist: Previously on Praluent - DISENROLLED 03/10/22- [id number]/ Praluent- nms  Credit Card on File: not applicable  Start Date on Rx:  08/06/23    The triage team has completed the benefits investigation and has determined that the patient is able to fill this medication at [facility]. Please contact the patient to complete the onboarding or follow up with the prescribing physician as needed.

## 2023-08-15 NOTE — Unmapped (Unsigned)
 Patient cancelled and rescheduled appointment. (with thoracostomy and pleural hernia) in the 1990s  - Discussed dietary restrictions, HOB elevation (already sleeps elevated with electronic bed), lifestyle changes  - Continue Protonix 40 BID  - Has follow up with GI 05/11/23    #Hearing loss  - Patient requesting ENT referral for hearing loss     Plan of care was discussed with the patient who acknowledged understanding and is in agreement.    Patient will No follow-ups on file. or sooner if needed.    William Keller was seen, examined and discussed with Dr. Marland Kitchen who agrees with the assessment and plan above.     JY:NWGNFAO Dannette Barbara Diddams, Franco Nones [place], [place]    Gaston Islam, [place]  Pulmonary and Critical Care Fellow    HISTORY:      History of Present Illness: William Keller is a 83 y.o. male who is seen for chief complaint of COPD    Initially seen by Dr. Windell Moment 08/08/21:  I haven't seen a pulmonologist in a few years, and I started wheezing a month ago.     Was put on hydrocortisone a few years ago and this helped his breathing.      Has a cough in the morning. Occasionally has sneezing attacks.   Short of breath with activity. Hardest activity in last 2 weeks was flooring in the bathroom. Has a lot of back pain that limits his activity.      Trying to lose weight with his primary care doctor.      Has GERD, but controlled well now. Never exacerbations.      Holistic doctor put him on Cortef 5mg  TID but he has weaned down.      Has lots of allergies, especially to medicines.      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 Raised cows and chickens. DDog at home. No birds in the house    Interval history 08/07/22  Patient accompanied by wife William Keller) today.   - Patient starting Jan 2024, had some wheezing, congestion, coughing. Initially went to Urgent care 1/20224 and was prescribed levaquin (had hamstring pain) so switched to doxycycline. Also noted to have episodes of hypoxia (down to 80's) so started using his son-in-law's O2.   - Currently denies congestion, coughing, fever  - Admits to acid reflux pantoprazole BID (improved with meds). Denies any dysphagia    Interval History 11/08/22  Patient accompanied by wife William Keller) today.   - Denies wheezing, congestion, coughing, dyspnea on exertion   - Using Trelegy daily   - Hasn't used his albuterol since discharge 07/27/22. He was hospitalized 07/26/22-07/27/22 for COPD exacerbation thought to be due to his GERD.  - Denies acid reflux after starting the pantoprazole BID    Interval history 05/07/23   - He still has dyspnea on exertion, feels about the same   - He can still walk 0.5 mile before stopping to catch his breath  - Stopped taking the trelegy x 1 month, due to being in the insurance donut and being asked to pay $300 out of pocket  - Hasn't needed albuterol   - No recent hospitalizations  - Had a barbeque yesterday so feeling a bit tired today  - Patient has been trying to lose weight (less activity and eating less)    Interval history 08/15/23:  -     Asthma:  - Denies childhood asthma     COPD  Exacerbations: one (06/2022) in the last 12 months  Rescue inhaler use: duonebs BID (takes despite lack of symptoms)  Controller inhaler adherence: Trelegy once daily   Tobacco use: former   mMRC:   0 I only get breathless with strenuous exercise  1 I get short of breath when hurrying on level ground or walking up a slight hill  2 On level ground, I walk slower than people of the same age because of breathlessness, or have to stop for a breath when walking at my own pace  3 I stop for a breath after walking about 100 yards or after a few minutes on level ground  4 I am too breathless to leave the house or I am breathless when dressing    Exposure/Occupational:   Admits to prior Tobacco use: 1 ppd x 30 years, quit 1992; chewing tobacco 1 pack per day x 25-30 years  Denies Vaping, Denies Illicit drug use:   - Admits: exposure to asbestosis, chemicals, dust during Holiday representative work   - Denies: exposure to [employer], hot tubs/saunas, machinery, coal mining,sandblasting/quarries, ceramics, mold, firefighting, Office manager, metal vapors, Arts administrator  - Has cows, chickens (15), dogs, used to have horse    Family history:  - No family history of lung diseases, lung cancers, autoimmune conditions   - Father had COPD (smoker)  - Mother had throat cancer (dx in her 84's), amyloidosis     Social History:  - Occupation: used to work as a Product/process development scientist; does light farming  - Lives with: wife    OSH records and referral documentation reviewed.     Review of Systems:  A comprehensive review of systems was completed and negative except as noted in HPI.    Relevant Medications: Reviewed    Allergies: Reviewed.    Other History:  The past medical history, surgical history, social history, family history, medications and allergies were personally reviewed and updated in the patient's electronic medical record. Pertinent items are noted above.        PHYSICAL EXAM:      Physical Exam:  There were no vitals filed for this visit.    There is no height or weight on file to calculate BMI.    General: Obese male  BMI 31, in no apparent distress  Eyes: PERRL. Anicteric sclera.   ENT:  Nasal mucosa clear.  Lymph: No cervical or supraclavicular lymphadenopathy.  Respiratory: R>L wheezing with forced exhalation, L chest wall with pulmonary hernia  Cardiovascular: RRR, +s1/s2, Normal P2, no RV heave, No MRG, No edema  Abdomen: BS present, Soft, Non-tender, non-distended.  Musculoskeletal/extremities: Normal muscle tone and strength. No cyanosis. No clubbing. No BLE edema  Skin: No skin rashes on clothed exam  Neuro: Alert and oriented x 3. CN grossly intact. No focal neurological deficits       LABORATORY and RADIOLOGY DATA:     Pulmonary Function Test Results:    Date FEV1  (% Pred) FVC   (% Pred) FEV1/FVC DLCO  (% Pred) TLC Desat  Distance   07/29/21 1.58 (58) 2.77 (75.9) 57       11/08/22 2.01 (78)  2.12 (82) 3.53 (101)  3.67 (105) 57%  58% 84%        : during hospitalization 06/2022    Pertinent Laboratory Data:  Reviewed    Pertinent Imaging Data:  Images were personally reviewed with attending.       William Keller Griffith Citron, [place]  Pulmonary and Critical Care  Fellow

## 2023-08-15 NOTE — Unmapped (Signed)
 Patient called and stated that he accidentally canceled his appointment via the automated system.  Therefore, patient was rescheduled for today.  However patient left again stating that he had another appointment in [place] and this was the initial reason why he had canceled his appointment.  Called patient and discussed rescheduling him.  Patient is doing well without respiratory issues, continues on his Trelegy.  Asked patient if he had followed up with pulmonary rehab.  He says he is busy due to his wife recently had a heart valve replacement surgery.  He would like to wait a little bit before attending pulmonary rehab.    Will have staff call to reschedule patient.    Christelle Igoe Griffith Citron, DO  Pulmonary and Critical Care Fellow

## 2023-08-22 ENCOUNTER — Ambulatory Visit: Payer: Self-pay | Admitting: Urology

## 2023-08-22 VITALS — BP 146/87 | HR 82 | Ht 69.0 in | Wt 226.1 lb

## 2023-08-22 DIAGNOSIS — Z8551 Personal history of malignant neoplasm of bladder: Secondary | ICD-10-CM

## 2023-08-22 DIAGNOSIS — R310 Gross hematuria: Secondary | ICD-10-CM | POA: Diagnosis not present

## 2023-08-22 LAB — URINALYSIS, COMPLETE
Bilirubin, UA: NEGATIVE
Glucose, UA: NEGATIVE
Ketones, UA: NEGATIVE
Nitrite, UA: NEGATIVE
Protein,UA: NEGATIVE
RBC, UA: NEGATIVE
Specific Gravity, UA: >1.03 — ABNORMAL HIGH (ref 1.005–1.030)
Urobilinogen, Ur: 0.2 mg/dL (ref 0.2–1.0)
pH, UA: 5.5 (ref 5.0–7.5)

## 2023-08-22 LAB — MICROSCOPIC EXAMINATION

## 2023-08-22 NOTE — Progress Notes (Signed)
    01/10/22  CC:  Chief Complaint  Patient presents with   Cysto    HPI: Dustin Blackburn is a 83 y.o. male  who returns today for cysto/stent removal s/p cystoscopy w/ bladder bx, Urolift, ureteroscopy/lithotripsy, and remote history of bladder cancer who presents today for surveillance cystoscopy.   He was initially found to have incidental bladder wall thickening on CT abdomen pelvis on 03/2016. Cystoscopy showed a papillary proximal and 1 cm tumor at the dome of the bladder. He was taken to the operating room on 06/12/2016 for TURBT, bilateral retrograde, and instillation of mitomycin.   Surgical pathology was consistent with low-grade, noninvasive tumor.   He returned to the operating room on 10/02/2017 for cystoscopy, bladder biopsy, bilateral retrograde pyelograms secondary to bladder erythema.    Surgical pathology was benign with evidence of squamous metaplasia and reactive changes, scarlike fibrosis, but no evidence of malignancy or dysplasia.   He underwent another bladder biopsy for bladder erythema in 10/2019 at the time of ureteroscopy.  This was consistent with mucosal edema and mixed inflammation but no malignancy.   Notably, he also has a personal history of kidney stones.     Vitals:   08/22/23 1510  BP: (!) 146/87  Pulse: 82   NED. A&Ox3.   No respiratory distress   Abd soft, NT, ND Normal phallus with bilateral descended testicles  Cystoscopy Procedure Note  Patient identification was confirmed, informed consent was obtained, and patient was prepped using Betadine solution.  Lidocaine jelly was administered per urethral meatus.     Pre-Procedure: - Inspection reveals a normal caliber ureteral meatus.  Procedure: The flexible cystoscope was introduced without difficulty - No urethral strictures/lesions are present. - Enlarged prostate bilobar coaptation  - Elevated bladder neck - Bilateral ureteral orifices identified - Bladder mucosa unremarkable today  with a few stellate scars - No bladder stones - No trabeculation  Retroflexion shows NED   Post-Procedure: - Patient tolerated the procedure well  Assessment/ Plan:  1.  Bladder cancer - No evidence of disease today, will continue to follow on annual basis with cystoscopy   2. Kidney stone -Asymptomatic, nonobstructing  F/u 1 year with cysto    Vanna Scotland, MD

## 2023-09-12 NOTE — Progress Notes (Unsigned)
 Patient ID: Dustin Blackburn, male   DOB: Oct 07, 1940, 83 y.o.   MRN: [id number]  Chief Complaint: Self-referred regarding dermal cyst right posterior neck.  History of Present Illness Dustin Blackburn is a 83 y.o. male with complaints of an area on his skin in his right posterior neck that has been a source of reassurance over many years.  He has never had inflammation of the area, never had any drainage.  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  He also has a degree of puffiness overlying the mastoid process behind his right ear.  He has had no fevers or chills, no ear drainage.  No other skin changes.   No known ENT issues.  History of DJD, with some lower back issues.  Reportedly has had some other experience with some dermal cysts that had roots in the base of his right neck.  So he self presents for self-referral today  Past Medical History Past Medical History:  Diagnosis Date   Arthritis    Asthma    Cancer (HCC) 2018   bladder   Cataracts, bilateral    Cold    COPD (chronic obstructive pulmonary disease) (HCC)    Coronary artery disease    DDD (degenerative disc disease), cervical    Depression    Diabetes mellitus without complication (HCC)    Dyspnea    GERD (gastroesophageal reflux disease)    Hearing loss associated with syndrome of both ears    Heart attack (HCC) 2016   stented x 1   History of aspiration pneumonitis    History of kidney stones    Hyperlipidemia    Kidney cysts    Kidney stones    Osteoarthritis of back    Psoriasis    Psoriasis    Seasonal allergies    Wears dentures    full upper      Past Surgical History:  Procedure Laterality Date   CARDIAC CATHETERIZATION  2016 and 2018   CATARACT EXTRACTION W/PHACO Left 05/27/2020   Procedure: CATARACT EXTRACTION PHACO AND INTRAOCULAR LENS PLACEMENT (IOC) LEFT DIABETIC;  Surgeon: Nevada Crane, MD;  Location: [facility];  Service: Ophthalmology;  Laterality: Left;  3.73 0:38.1   CATARACT EXTRACTION W/PHACO Right 06/21/2020   Procedure: CATARACT EXTRACTION PHACO AND INTRAOCULAR LENS PLACEMENT (IOC) RIGHT DIABETIC;  Surgeon: Nevada Crane, MD;  Location: [facility];  Service: Ophthalmology;  Laterality: Right;  3.20 0:30.5   COLONOSCOPY     COLONOSCOPY WITH PROPOFOL N/A 09/18/2017   Procedure: COLONOSCOPY WITH PROPOFOL;  Surgeon: Christena Deem, MD;  Location: [facility] END[facility];  Service: Endoscopy;  Laterality: N/A;   CORONARY ANGIOPLASTY  2016   CORONARY STENT PLACEMENT  2016   CYSTOSCOPY W/ RETROGRADES Bilateral 06/12/2016   Procedure: CYSTOSCOPY WITH RETROGRADE PYELOGRAM;  Surgeon: Vanna Scotland, MD;  Location: [facility] ORS;  Service: Urology;  Laterality: Bilateral;   CYSTOSCOPY W/ RETROGRADES Bilateral 10/02/2017   Procedure: CYSTOSCOPY WITH RETROGRADE PYELOGRAM;  Surgeon: Vanna Scotland, MD;  Location: [facility];  Service: Urology;  Laterality: Bilateral;   CYSTOSCOPY WITH BIOPSY N/A 10/02/2017   Procedure: CYSTOSCOPY WITH Bladder BIOPSY;  Surgeon: Vanna Scotland, MD;  Location: [facility];  Service: Urology;  Laterality: N/A;   CYSTOSCOPY WITH BIOPSY N/A 11/10/2019   Procedure: CYSTOSCOPY WITH Bladder BIOPSY;  Surgeon: Vanna Scotland, MD;  Location: [facility];  Service: Urology;  Laterality: N/A;   CYSTOSCOPY WITH INSERTION OF UROLIFT N/A 11/10/2019   Procedure: CYSTOSCOPY WITH INSERTION OF UROLIFT;  Surgeon: Dustin Gimenez, MD;  Location: [facility];  Service: Urology;  Laterality: N/A;   CYSTOSCOPY WITH STENT PLACEMENT Right 11/10/2019   Procedure: CYSTOSCOPY WITH STENT PLACEMENT;  Surgeon: Dustin Gimenez, MD;  Location: [facility];  Service: Urology;  Laterality: Right;   CYSTOSCOPY WITH URETEROSCOPY Right 10/02/2017   Procedure: DIAGNOSTIC RIGHT URETEROSCOPY;  Surgeon: Dustin Gimenez, MD;  Location: [facility];  Service: Urology;  Laterality: Right;    ESOPHAGOGASTRODUODENOSCOPY (EGD) WITH PROPOFOL N/A 09/18/2017   Procedure: ESOPHAGOGASTRODUODENOSCOPY (EGD) WITH PROPOFOL;  Surgeon: Deveron Fly, MD;  Location: [facility] ENDOSCOPY;  Service: Endoscopy;  Laterality: N/A;   fatty tumors removed     HIATAL HERNIA REPAIR  1992   LIPOMA EXCISION     right leg   NASOPHARYNGOSCOPY EUSTATION TUBE BALLOON DILATION Bilateral 06/13/2018   Procedure: NASOPHARYNGOSCOPY EUSTATION TUBE BALLOON DILATION DIABETIC, NEED EUSTATION TUBE BALLOON SYSTEM;  Surgeon: Mellody Sprout, MD;  Location: [facility];  Service: ENT;  Laterality: Bilateral;   RIGHT/LEFT HEART CATH AND CORONARY ANGIOGRAPHY N/A 12/21/2016   Procedure: Right/Left Heart Cath and Coronary Angiography;  Surgeon: Percival Brace, MD;  Location: [facility];  Service: Cardiovascular;  Laterality: N/A;   SINUS EXPLORATION  1992   TRANSURETHRAL RESECTION OF BLADDER TUMOR WITH MITOMYCIN-C N/A 06/12/2016   Procedure: TRANSURETHRAL RESECTION OF BLADDER TUMOR WITH MITOMYCIN-C;  Surgeon: Dustin Gimenez, MD;  Location: [facility];  Service: Urology;  Laterality: N/A;   TURBINATE REDUCTION Bilateral 06/13/2018   Procedure: Dominica Friends;  Surgeon: Mellody Sprout, MD;  Location: [facility];  Service: ENT;  Laterality: Bilateral;  Diabetic - insulin   UMBILICAL HERNIA REPAIR N/A 06/12/2016   Procedure: HERNIA REPAIR UMBILICAL ADULT;  Surgeon: Jerlean Mood, MD;  Location: [facility];  Service: General;  Laterality: N/A;   URETEROSCOPY Right 06/12/2016   Procedure: URETEROSCOPY;  Surgeon: Dustin Gimenez, MD;  Location: [facility];  Service: Urology;  Laterality: Right;   URETEROSCOPY WITH HOLMIUM LASER LITHOTRIPSY Right 11/10/2019   Procedure: URETEROSCOPY WITH HOLMIUM LASER LITHOTRIPSY;  Surgeon: Dustin Gimenez, MD;  Location: [facility];  Service: Urology;  Laterality: Right;    Allergies  Allergen Reactions   Actos [Pioglitazone] Palpitations    Pt reported a MI     Glimepiride Rash    Shortness of breath also   Glipizide Shortness Of Breath   Metformin Shortness Of Breath    Pt states he "can't breathe and it gives him sinus congestion".    Levofloxacin Other (See Comments)    States it "attacked my joints" and couldn't walk.   Lisinopril Cough   Versed [Midazolam]     Over-sedation.  Hard to clear from system.   Amoxicillin Nausea Only   Empagliflozin Rash    Rash and itching   Erythromycin Base Nausea Only   Esomeprazole Magnesium Other (See Comments)    Makes patients heart burn worse   Lipitor [Atorvastatin]     indigestion   Liraglutide Other (See Comments)    bloating   Oxycodone Nausea Only    Pt states he was severely and acutely nauseated and bloated   Pt states he was severely and acutely nauseated and bloated   Penicillins Nausea Only    Has patient had a PCN reaction causing immediate rash, facial/tongue/throat swelling, SOB or lightheadedness with hypotension: no Has patient had a PCN reaction causing severe rash involving mucus membranes or  skin necrosis: no Has patient had a PCN reaction that required hospitalization:no Has patient had a PCN reaction occurring within the last 10 years: no If all of the above answers are "NO", then may proceed with Cephalosporin use.    Sitagliptin Other (See Comments)    Pt states it "gives him joint pain" & redness     Current Outpatient Medications  Medication Sig Dispense Refill   aspirin EC 81 MG tablet Take 81 mg by mouth daily. Swallow whole.     clobetasol cream (TEMOVATE) 0.05 % Apply 1 application topically 2 (two) times daily as needed (psoriasis).      fluocinonide cream (LIDEX) 0.05 % Apply 1 application topically as needed.     fluticasone (FLONASE) 50 MCG/ACT nasal spray Place into both nostrils daily as needed for allergies or rhinitis.     Fluticasone-Umeclidin-Vilant 100-62.5-25 MCG/ACT AEPB Inhale into the lungs.     hydrocortisone (CORTEF) 5 MG tablet Take by mouth.      No current facility-administered medications for this visit.    Family History Family History  Problem Relation Age of Onset   Cancer Mother    Prostate cancer Maternal Uncle    Prostate cancer Paternal Uncle    Prostate cancer Cousin    Congestive Heart Failure Father    Kidney cancer Neg Hx    Bladder Cancer Neg Hx       Social History Social History   Tobacco Use   Smoking status: Former    Current packs/day: 0.00    Types: Cigarettes    Start date: 05/29/1960    Quit date: 05/29/1990    Years since quitting: 33.3    Passive exposure: Past   Smokeless tobacco: Former    Types: Chew    Quit date: 1992  Vaping Use   Vaping status: Never Used  Substance Use Topics   Alcohol use: Not Currently    Comment: rarely   Drug use: No        Review of Systems  Constitutional:  Positive for malaise/fatigue.  HENT:  Positive for ear pain (right mastoid area), hearing loss and tinnitus. Negative for congestion, ear discharge and sinus pain.   Eyes:  Positive for pain.  Respiratory:  Positive for shortness of breath. Negative for cough, hemoptysis, sputum production and wheezing.   Cardiovascular:  Negative for chest pain and palpitations.  Gastrointestinal:  Positive for heartburn.  Genitourinary: Negative.   Skin:  Negative for itching and rash.  Neurological:  Positive for dizziness and headaches. Negative for tingling, sensory change and focal weakness.  Psychiatric/Behavioral: Negative.       Physical Exam Blood pressure 136/73, pulse 66, height 5\' 9"  (1.753 m), weight 227 lb (103 kg), SpO2 96%. Last Weight  Most recent update: 09/13/2023  9:20 AM    Weight  103 kg (227 lb)             CONSTITUTIONAL: Well developed, and nourished, appropriately responsive and aware without distress.   EYES: Sclera non-icteric.   EARS, NOSE, MOUTH AND THROAT:  Oral mucosa is pink and moist.     Hearing is intact to voice.  Slight asymmetric puffiness of the subcutaneous  tissues overlying the right mastoid, no definitive lymphadenopathy, no or mass present. NECK: Trachea is midline, and there is no jugular venous distension.  RESPIRATORY:   Normal respiratory effort without pathologic use of accessory muscles. CARDIOVASCULAR: Well perfused.  GI: The abdomen is  soft, nontender, and nondistended. MUSCULOSKELETAL:  Symmetrical muscle  tone appreciated in all four extremities.    SKIN: Skin turgor is normal. No pathologic skin lesions appreciated.  NEUROLOGIC:  Motor and sensation appear grossly normal.  Cranial nerves are grossly without defect. PSYCH:  Alert and oriented to person, place and time. Affect is appropriate for situation.  Data Reviewed I have personally reviewed what is currently available of the patient's imaging, recent labs and medical records.   Labs:     Latest Ref Rng & Units 11/06/2019   11:13 AM 04/21/2019    8:30 AM 06/02/2016   10:42 AM  CBC  WBC 4.0 - 10.5 K/uL 5.8  8.3  10.5   Hemoglobin 13.0 - 17.0 g/dL 82.9  56.2  13.0   Hematocrit 39.0 - 52.0 % 38.3  36.8  46.5   Platelets 150 - 400 K/uL 207  190  260       Latest Ref Rng & Units 10/24/2022    2:32 PM 06/26/2022    9:03 AM 11/06/2019   11:13 AM  CMP  Glucose 70 - 99 mg/dL   865   BUN 8 - 23 mg/dL   17   Creatinine 7.84 - 1.24 mg/dL 6.96  2.95  2.84   Sodium 135 - 145 mmol/L   140   Potassium 3.5 - 5.1 mmol/L   4.2   Chloride 98 - 111 mmol/L   106   CO2 22 - 32 mmol/L   27   Calcium 8.9 - 10.3 mg/dL   8.7     Imaging: Radiological images reviewed:   Within last 24 hrs: No results found.  Assessment    Right sided neck pain radiating to trapezius. Right mastoid tenderness. No remarkable dermal or subcutaneous pathology involving the right posterior/lateral neck area. Patient Active Problem List   Diagnosis Date Noted   Allergic rhinitis 06/05/2019   Abnormal findings on diagnostic imaging of lung 06/05/2019   Advice given about COVID-19 virus infection  06/05/2019   Arthritis 06/05/2019   Preop cardiovascular exam 09/28/2017   Unstable angina pectoris (HCC) 12/21/2016   CAFL (chronic airflow limitation) (HCC) 02/22/2015   Bilateral hearing loss 02/22/2015   ST elevation (STEMI) myocardial infarction (HCC) 12/31/2014   DDD (degenerative disc disease), cervical 08/28/2014   HLD (hyperlipidemia) 01/13/2014   Hay fever 05/14/2012   Brachial neuritis 10/20/2010   Psoriasis 08/06/2009   Diabetes mellitus, type 2 (HCC) 07/15/2009   Adiposity 07/30/2008   Sinusitis, chronic 05/01/2007   Airway hyperreactivity 08/14/2000   Atherosclerosis of coronary artery 08/14/2000   Acid reflux 08/14/2000    Plan    Advise referrals to ENT/neurosurgery for consultation/evaluation.  We discussed follow-up with PCP. I cannot expect excision of any dermal scarring, especially with its adjacent pain syndrome/complaints and yet it's soft mobility and lack of underlying subcutaneous lesion to be a solution to this patient's presenting complaints.  Face-to-face time spent with the patient and accompanying care providers(if present) was 40 minutes, spent counseling, educating, and coordinating care of the patient.    These notes generated with voice recognition software. I apologize for typographical errors.  Flynn Hylan M.D., FACS 09/13/2023, 9:37 AM

## 2023-09-13 ENCOUNTER — Ambulatory Visit: Admitting: Surgery

## 2023-09-13 ENCOUNTER — Encounter: Payer: Self-pay | Admitting: Surgery

## 2023-09-13 VITALS — BP 136/73 | HR 66 | Ht 69.0 in | Wt 227.0 lb

## 2023-09-13 DIAGNOSIS — M542 Cervicalgia: Secondary | ICD-10-CM

## 2023-09-13 DIAGNOSIS — H9201 Otalgia, right ear: Secondary | ICD-10-CM

## 2023-09-13 DIAGNOSIS — M5412 Radiculopathy, cervical region: Secondary | ICD-10-CM

## 2023-09-13 DIAGNOSIS — M5030 Other cervical disc degeneration, unspecified cervical region: Secondary | ICD-10-CM

## 2023-09-13 NOTE — Patient Instructions (Addendum)
 We will you to refer you to Neurosurgery for your neck pain that radiates.  We will refer you to ENT for the mass over your Mastoid Process.    They will call you to schedule these appointments.   Follow-up with our office as needed.  Please call and ask to speak with a nurse if you develop questions or concerns.

## 2023-09-14 NOTE — Progress Notes (Signed)
 Referring Physician:  Flynn Hylan, MD [address] [place],  [place] [address]  Primary Physician:  Sharyne Degree, FNP  History of Present Illness: 09/24/2023 Mr. Dustin Blackburn has a history of MI, CAD, DM, hyperlipidemia, bladder CA, kidney stones, GERD, asthma, COPD.   Seen by general surgery on 09/13/23 with concerns of cyst on right side of neck. Referred here for neck pain. Also referred to ENT for swelling of mastoid process. This has not been scheduled yet.   He has history of neck and back pain x years.   He has intermittent pain in right side of his neck when he is driving his tractor and looks over his right shoulder. Some pain into right shoulder. No arm pain. No numbness, tingling, or weakness in his hands.   He also has intermittent lower back pain with no leg pain. Pain is worse with bending and lifting. Some improvement with sitting, but he gets stiff after prolonged sitting. He has stiffness in his back. He has numbness and tingling in his feet from neuropathy.   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   He had only a week of relief with previous lumbar ESI- his blood sugar went over 600 after ESI.   He quit smoking in 1992.   Bowel/Bladder Dysfunction: history of bladder CA- he's had urinary urgency since  Conservative measures:  Physical therapy: has participated in PT at [facility] maybe around 2023 with minimal improvement Multimodal medical therapy including regular antiinflammatories: tylenol  Injections:  Bilateral L5-S1 TF ESI on 10/11/20 with Dr. Aleen Ammons- had relief for a week, blood sugar went to 600  Past Surgery: no surgery on his back or neck  Hildegard Low has no symptoms of cervical myelopathy.  The symptoms are causing a significant impact on the patient's life.   Review of Systems:  A 10 point review of systems is negative, except for the pertinent positives and  negatives detailed in the HPI.  Past Medical History: Past Medical History:  Diagnosis Date   Arthritis    Asthma    Cancer (HCC) 2018   bladder   Cataracts, bilateral    Cold    COPD (chronic obstructive pulmonary disease) (HCC)    Coronary artery disease    DDD (degenerative disc disease), cervical    Depression    Diabetes mellitus without complication (HCC)    Dyspnea    GERD (gastroesophageal reflux disease)    Hearing loss associated with syndrome of both ears    Heart attack (HCC) 2016   stented x 1   History of aspiration pneumonitis    History of kidney stones    Hyperlipidemia    Kidney cysts    Kidney stones    Osteoarthritis of back    Psoriasis    Psoriasis    Seasonal allergies    Wears dentures    full upper    Past Surgical History: Past Surgical History:  Procedure Laterality Date   CARDIAC CATHETERIZATION  2016 and 2018   CATARACT EXTRACTION W/PHACO Left 05/27/2020   Procedure: CATARACT EXTRACTION PHACO AND INTRAOCULAR LENS PLACEMENT (IOC) LEFT DIABETIC;  Surgeon: Rosa College, MD;  Location: [facility];  Service: Ophthalmology;  Laterality: Left;  3.73 0:38.1   CATARACT EXTRACTION W/PHACO Right 06/21/2020   Procedure: CATARACT EXTRACTION PHACO AND INTRAOCULAR LENS PLACEMENT (IOC) RIGHT DIABETIC;  Surgeon: Rosa College, MD;  Location: [facility];  Service: Ophthalmology;  Laterality:  Right;  3.20 0:30.5   COLONOSCOPY     COLONOSCOPY WITH PROPOFOL  N/A 09/18/2017   Procedure: COLONOSCOPY WITH PROPOFOL ;  Surgeon: Deveron Fly, MD;  Location: [facility] END[facility]COPY;  Service: Endoscopy;  Laterality: N/A;   CORONARY ANGIOPLASTY  2016   CORONARY STENT PLACEMENT  2016   CYSTOSCOPY W/ RETROGRADES Bilateral 06/12/2016   Procedure: CYSTOSCOPY WITH RETROGRADE PYELOGRAM;  Surgeon: Dustin Gimenez, MD;  Location: [facility];  Service: Urology;  Laterality: Bilateral;   CYSTOSCOPY W/ RETROGRADES Bilateral 10/02/2017   Procedure: CYSTOSCOPY  WITH RETROGRADE PYELOGRAM;  Surgeon: Dustin Gimenez, MD;  Location: [facility];  Service: Urology;  Laterality: Bilateral;   CYSTOSCOPY WITH BIOPSY N/A 10/02/2017   Procedure: CYSTOSCOPY WITH Bladder BIOPSY;  Surgeon: Dustin Gimenez, MD;  Location: [facility];  Service: Urology;  Laterality: N/A;   CYSTOSCOPY WITH BIOPSY N/A 11/10/2019   Procedure: CYSTOSCOPY WITH Bladder BIOPSY;  Surgeon: Dustin Gimenez, MD;  Location: [facility];  Service: Urology;  Laterality: N/A;   CYSTOSCOPY WITH INSERTION OF UROLIFT N/A 11/10/2019   Procedure: CYSTOSCOPY WITH INSERTION OF UROLIFT;  Surgeon: Dustin Gimenez, MD;  Location: [facility];  Service: Urology;  Laterality: N/A;   CYSTOSCOPY WITH STENT PLACEMENT Right 11/10/2019   Procedure: CYSTOSCOPY WITH STENT PLACEMENT;  Surgeon: Dustin Gimenez, MD;  Location: [facility];  Service: Urology;  Laterality: Right;   CYSTOSCOPY WITH URETEROSCOPY Right 10/02/2017   Procedure: DIAGNOSTIC RIGHT URETEROSCOPY;  Surgeon: Dustin Gimenez, MD;  Location: [facility];  Service: Urology;  Laterality: Right;   ESOPHAGOGASTRODUODENOSCOPY (EGD) WITH PROPOFOL  N/A 09/18/2017   Procedure: ESOPHAGOGASTRODUODENOSCOPY (EGD) WITH PROPOFOL ;  Surgeon: Deveron Fly, MD;  Location: [facility];  Service: Endoscopy;  Laterality: N/A;   fatty tumors removed     HIATAL HERNIA REPAIR  1992   LIPOMA EXCISION     right leg   NASOPHARYNGOSCOPY EUSTATION TUBE BALLOON DILATION Bilateral 06/13/2018   Procedure: NASOPHARYNGOSCOPY EUSTATION TUBE BALLOON DILATION DIABETIC, NEED EUSTATION TUBE BALLOON SYSTEM;  Surgeon: Mellody Sprout, MD;  Location: [facility];  Service: ENT;  Laterality: Bilateral;   RIGHT/LEFT HEART CATH AND CORONARY ANGIOGRAPHY N/A 12/21/2016   Procedure: Right/Left Heart Cath and Coronary Angiography;  Surgeon: Percival Brace, MD;  Location: [facility];  Service: Cardiovascular;  Laterality: N/A;   SINUS EXPLORATION  1992   TRANSURETHRAL RESECTION OF BLADDER TUMOR  WITH MITOMYCIN -C N/A 06/12/2016   Procedure: TRANSURETHRAL RESECTION OF BLADDER TUMOR WITH MITOMYCIN -C;  Surgeon: Dustin Gimenez, MD;  Location: [facility];  Service: Urology;  Laterality: N/A;   TURBINATE REDUCTION Bilateral 06/13/2018   Procedure: Dominica Friends;  Surgeon: Mellody Sprout, MD;  Location: [facility];  Service: ENT;  Laterality: Bilateral;  Diabetic - insulin   UMBILICAL HERNIA REPAIR N/A 06/12/2016   Procedure: HERNIA REPAIR UMBILICAL ADULT;  Surgeon: Jerlean Mood, MD;  Location: [facility];  Service: General;  Laterality: N/A;   URETEROSCOPY Right 06/12/2016   Procedure: URETEROSCOPY;  Surgeon: Dustin Gimenez, MD;  Location: [facility];  Service: Urology;  Laterality: Right;   URETEROSCOPY WITH HOLMIUM LASER LITHOTRIPSY Right 11/10/2019   Procedure: URETEROSCOPY WITH HOLMIUM LASER LITHOTRIPSY;  Surgeon: Dustin Gimenez, MD;  Location: [facility];  Service: Urology;  Laterality: Right;    Allergies: Allergies as of 09/24/2023 - Review Complete 09/24/2023  Allergen Reaction Noted   Actos [pioglitazone] Palpitations 12/19/2016   Glimepiride Rash 02/22/2015   Glipizide Shortness Of Breath 09/17/2017   Metformin Shortness Of Breath 02/22/2015   Levofloxacin  Other (See Comments) 06/22/2022   Lisinopril Cough  03/26/2023   Versed  [midazolam ]  05/18/2020   Amoxicillin Nausea Only 02/22/2015   Empagliflozin Rash 12/25/2022   Erythromycin base Nausea Only 02/22/2015   Esomeprazole magnesium Other (See Comments) 02/22/2015   Lipitor [atorvastatin]  09/17/2017   Liraglutide Other (See Comments) 02/22/2015   Oxycodone  Nausea Only 02/28/2021   Penicillins Nausea Only 02/22/2015   Sitagliptin Other (See Comments) 02/22/2015    Medications: Outpatient Encounter Medications as of 09/24/2023  Medication Sig   aspirin  EC 81 MG tablet Take 81 mg by mouth daily. Swallow whole.   clobetasol cream (TEMOVATE) 0.05 % Apply 1 application topically 2 (two) times daily as needed  (psoriasis).    fluocinonide cream (LIDEX) 0.05 % Apply 1 application topically as needed.   fluticasone  (FLONASE) 50 MCG/ACT nasal spray Place into both nostrils daily as needed for allergies or rhinitis.   Fluticasone -Umeclidin-Vilant 100-62.5-25 MCG/ACT AEPB Inhale into the lungs.   hydrocortisone  (CORTEF ) 5 MG tablet Take by mouth.   insulin NPH-regular Human (70-30) 100 UNIT/ML injection Inject 25 Units into the skin 2 (two) times daily with a meal.   No facility-administered encounter medications on file as of 09/24/2023.    Social History: Social History   Tobacco Use   Smoking status: Former    Current packs/day: 0.00    Types: Cigarettes    Start date: 05/29/1960    Quit date: 05/29/1990    Years since quitting: 33.3    Passive exposure: Past   Smokeless tobacco: Former    Types: Chew    Quit date: 1992  Vaping Use   Vaping status: Never Used  Substance Use Topics   Alcohol use: Not Currently    Comment: rarely   Drug use: No    Family Medical History: Family History  Problem Relation Age of Onset   Cancer Mother    Prostate cancer Maternal Uncle    Prostate cancer Paternal Uncle    Prostate cancer Cousin    Congestive Heart Failure Father    Kidney cancer Neg Hx    Bladder Cancer Neg Hx     Physical Examination: Vitals:   09/24/23 0907  BP: 138/78    General: Patient is well developed, well nourished, calm, collected, and in no apparent distress. Attention to examination is appropriate.  Respiratory: Patient is breathing without any difficulty.   NEUROLOGICAL:     Awake, alert, oriented to person, place, and time.  Speech is clear and fluent. Fund of knowledge is appropriate.   Cranial Nerves: Pupils equal round and reactive to light.  Facial tone is symmetric.    No posterior cervical tenderness. No tenderness in bilateral trapezial region.   I don't appreciate any significant swelling right lateral neck.   No posterior lumbar tenderness.   No  abnormal lesions on exposed skin.   Strength: Side Biceps Triceps Deltoid Interossei Grip Wrist Ext. Wrist Flex.  R 5 5 5 5 5 5 5   L 5 5 5 5 5 5 5    Side Iliopsoas Quads Hamstring PF DF EHL  R 5 5 5 5 5 5   L 5 5 5 5 5 5    Reflexes are 2+ and symmetric at the biceps, brachioradialis, patella and achilles.   Hoffman's is absent.  Clonus is not present.   Bilateral upper and lower extremity sensation is intact to light touch.     Gait is normal.    Medical Decision Making  Imaging: No cervical imaging available.   Lumbar xrays dated 09/18/23:  Diffuse lumbar  spondylosis and DDD that is worse L2-L3 and L4-S1.   Report for above xrays is not yet available.   Assessment and Plan: Mr. Berkenpas has a history of neck and back pain x years.   He has intermittent pain in right side of his neck when he is driving his tractor and looks over his right shoulder. Some pain into right shoulder. No arm pain. No numbness, tingling, or weakness in his hands.   His primary concern is getting "mass" on right side of his neck removed.   No cervical imaging done. I don't appreciate any mass on right side of his neck.   He also has intermittent lower back pain with no leg pain. He has numbness and tingling in his feet from neuropathy.   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 Above xrays show diffuse lumbar spondylosis and DDD that is worse L2-L3 and L4-S1.   Treatment options discussed with patient and following plan made:   - MRI of lumbar spine to further evaluate chronic LBP. No help with previous injection, PT, or time.  - Cervical xrays ordered to be done when he has lumbar MRI.  - He is not interested in further ESIs as blood sugar went up to 600 with last injection.  - Discussed PT for cervical/lumbar spine. He declines for now. May revisit depending on above imaging.  - Follow up with ENT- was referred by general surgery. I did not  appreciate a mass on right side of his neck and we discussed we would not remove any masses- this would be done by general surgery.  - Will schedule follow up visit to review MRI/xray results once I get them back.   I spent a total of 35 minutes in face-to-face and non-face-to-face activities related to this patient's care today including review of outside records, review of imaging, review of symptoms, physical exam, discussion of differential diagnosis, discussion of treatment options, and documentation.   Thank you for involving me in the care of this patient.   Lucetta Russel PA-C Dept. of Neurosurgery

## 2023-09-18 ENCOUNTER — Ambulatory Visit
Admission: RE | Admit: 2023-09-18 | Discharge: 2023-09-18 | Disposition: A | Discharge status: Home / Self Care | Attending: Orthopedic Surgery | Admitting: Orthopedic Surgery

## 2023-09-18 ENCOUNTER — Telehealth: Payer: Self-pay

## 2023-09-18 ENCOUNTER — Ambulatory Visit
Admission: RE | Admit: 2023-09-18 | Discharge: 2023-09-18 | Disposition: A | Discharge status: Home / Self Care | Source: Ambulatory Visit | Attending: Orthopedic Surgery | Admitting: Orthopedic Surgery

## 2023-09-18 DIAGNOSIS — G8929 Other chronic pain: Secondary | ICD-10-CM | POA: Insufficient documentation

## 2023-09-18 DIAGNOSIS — M545 Low back pain, unspecified: Secondary | ICD-10-CM | POA: Diagnosis present

## 2023-09-18 NOTE — Telephone Encounter (Signed)
 Called patient he is going to have his xray done   x rays ordered

## 2023-09-20 ENCOUNTER — Ambulatory Visit (INDEPENDENT_AMBULATORY_CARE_PROVIDER_SITE_OTHER): Admitting: Orthopedic Surgery

## 2023-09-20 ENCOUNTER — Encounter: Payer: Self-pay | Admitting: Orthopedic Surgery

## 2023-09-20 VITALS — BP 129/76 | HR 69 | Ht 69.0 in | Wt 226.0 lb

## 2023-09-20 DIAGNOSIS — M545 Low back pain, unspecified: Secondary | ICD-10-CM

## 2023-09-20 DIAGNOSIS — G8929 Other chronic pain: Secondary | ICD-10-CM

## 2023-09-20 NOTE — Progress Notes (Signed)
 New Patient Visit  Assessment: Dustin Blackburn is a 83 y.o. male with the following: 1. Chronic bilateral low back pain without sciatica  Plan: REEGAN BOUFFARD has chronic low back pain.  We reviewed radiographs in clinic today which demonstrates evidence of diffuse degenerative changes throughout the lumbar spine.  No anterolisthesis.  On physical exam, he has excellent lower body strength.  Negative straight leg raise bilaterally.  No evidence of nerve compression.  We discussed multiple potential treatment options including increased activity, medications, heating pads, TENS units and physical therapy.  He is previously had steroid injections, with limited sustained improvement, but also affected his diabetes.  He is scheduled to be evaluated by a neurosurgeon on Monday for chronic pain in his neck.  I have urged him to discuss some of his lower back issues at that visit as well.  If he is interested in formal physical therapy, I am happy to help coordinate.  Otherwise, follow-up as needed.    Follow-up: Return if symptoms worsen or fail to improve.  Subjective:  Chief Complaint  Patient presents with   Back Pain    Lower back pain bilateral  right is worse than left  pain for 2 years now  he has neck pain as well.  No injuries  taking tylenol  for pain and topical rubs     History of Present Illness: Dustin Blackburn is a 83 y.o. male who presents for evaluation of low back pain.  He states he has had pain in his lower back for couple of years.  No specific injury.  Pain is in the lower back, without radiating pains into his legs.  Pain depends on the day.  Some days are better than others.  Some days he can walk up to a mile, but the next day he has difficulty getting out of bed.  He has tried taking Tylenol .  He has worked with a Paramedic, primarily doing some stretching.  He has had injections, which helped for a brief periods of time, but also affected his diabetes, causing his sugars to get  into the 600s.  He is also complaining of neck pain, and is scheduled to see a neurosurgeon on Monday.  He has neuropathy, with some numbness and tingling of both feet.   Review of Systems: No fevers or chills No numbness or tingling No chest pain No shortness of breath No bowel or bladder dysfunction No GI distress No headaches   Medical History:  Past Medical History:  Diagnosis Date   Arthritis    Asthma    Cancer (HCC) 2018   bladder   Cataracts, bilateral    Cold    COPD (chronic obstructive pulmonary disease) (HCC)    Coronary artery disease    DDD (degenerative disc disease), cervical    Depression    Diabetes mellitus without complication (HCC)    Dyspnea    GERD (gastroesophageal reflux disease)    Hearing loss associated with syndrome of both ears    Heart attack (HCC) 2016   stented x 1   History of aspiration pneumonitis    History of kidney stones    Hyperlipidemia    Kidney cysts    Kidney stones    Osteoarthritis of back    Psoriasis    Psoriasis    Seasonal allergies    Wears dentures    full upper    Past Surgical History:  Procedure Laterality Date   CARDIAC CATHETERIZATION  2016 and 2018  CATARACT EXTRACTION W/PHACO Left 05/27/2020   Procedure: CATARACT EXTRACTION PHACO AND INTRAOCULAR LENS PLACEMENT (IOC) LEFT DIABETIC;  Surgeon: Rosa College, MD;  Location: [facility];  Service: Ophthalmology;  Laterality: Left;  3.73 0:38.1   CATARACT EXTRACTION W/PHACO Right 06/21/2020   Procedure: CATARACT EXTRACTION PHACO AND INTRAOCULAR LENS PLACEMENT (IOC) RIGHT DIABETIC;  Surgeon: Rosa College, MD;  Location: [facility];  Service: Ophthalmology;  Laterality: Right;  3.20 0:30.5   COLONOSCOPY     COLONOSCOPY WITH PROPOFOL  N/A 09/18/2017   Procedure: COLONOSCOPY WITH PROPOFOL ;  Surgeon: Deveron Fly, MD;  Location: [facility] END[facility]COPY;  Service: Endoscopy;  Laterality: N/A;   CORONARY ANGIOPLASTY  2016   CORONARY STENT  PLACEMENT  2016   CYSTOSCOPY W/ RETROGRADES Bilateral 06/12/2016   Procedure: CYSTOSCOPY WITH RETROGRADE PYELOGRAM;  Surgeon: Dustin Gimenez, MD;  Location: [facility];  Service: Urology;  Laterality: Bilateral;   CYSTOSCOPY W/ RETROGRADES Bilateral 10/02/2017   Procedure: CYSTOSCOPY WITH RETROGRADE PYELOGRAM;  Surgeon: Dustin Gimenez, MD;  Location: [facility];  Service: Urology;  Laterality: Bilateral;   CYSTOSCOPY WITH BIOPSY N/A 10/02/2017   Procedure: CYSTOSCOPY WITH Bladder BIOPSY;  Surgeon: Dustin Gimenez, MD;  Location: [facility];  Service: Urology;  Laterality: N/A;   CYSTOSCOPY WITH BIOPSY N/A 11/10/2019   Procedure: CYSTOSCOPY WITH Bladder BIOPSY;  Surgeon: Dustin Gimenez, MD;  Location: [facility];  Service: Urology;  Laterality: N/A;   CYSTOSCOPY WITH INSERTION OF UROLIFT N/A 11/10/2019   Procedure: CYSTOSCOPY WITH INSERTION OF UROLIFT;  Surgeon: Dustin Gimenez, MD;  Location: [facility];  Service: Urology;  Laterality: N/A;   CYSTOSCOPY WITH STENT PLACEMENT Right 11/10/2019   Procedure: CYSTOSCOPY WITH STENT PLACEMENT;  Surgeon: Dustin Gimenez, MD;  Location: [facility];  Service: Urology;  Laterality: Right;   CYSTOSCOPY WITH URETEROSCOPY Right 10/02/2017   Procedure: DIAGNOSTIC RIGHT URETEROSCOPY;  Surgeon: Dustin Gimenez, MD;  Location: [facility];  Service: Urology;  Laterality: Right;   ESOPHAGOGASTRODUODENOSCOPY (EGD) WITH PROPOFOL  N/A 09/18/2017   Procedure: ESOPHAGOGASTRODUODENOSCOPY (EGD) WITH PROPOFOL ;  Surgeon: Deveron Fly, MD;  Location: [facility] ENDOSCOPY;  Service: Endoscopy;  Laterality: N/A;   fatty tumors removed     HIATAL HERNIA REPAIR  1992   LIPOMA EXCISION     right leg   NASOPHARYNGOSCOPY EUSTATION TUBE BALLOON DILATION Bilateral 06/13/2018   Procedure: NASOPHARYNGOSCOPY EUSTATION TUBE BALLOON DILATION DIABETIC, NEED EUSTATION TUBE BALLOON SYSTEM;  Surgeon: Mellody Sprout, MD;  Location: [facility];  Service: ENT;  Laterality: Bilateral;   RIGHT/LEFT HEART CATH AND  CORONARY ANGIOGRAPHY N/A 12/21/2016   Procedure: Right/Left Heart Cath and Coronary Angiography;  Surgeon: Percival Brace, MD;  Location: [facility];  Service: Cardiovascular;  Laterality: N/A;   SINUS EXPLORATION  1992   TRANSURETHRAL RESECTION OF BLADDER TUMOR WITH MITOMYCIN -C N/A 06/12/2016   Procedure: TRANSURETHRAL RESECTION OF BLADDER TUMOR WITH MITOMYCIN -C;  Surgeon: Dustin Gimenez, MD;  Location: [facility];  Service: Urology;  Laterality: N/A;   TURBINATE REDUCTION Bilateral 06/13/2018   Procedure: Dominica Friends;  Surgeon: Mellody Sprout, MD;  Location: [facility];  Service: ENT;  Laterality: Bilateral;  Diabetic - insulin   UMBILICAL HERNIA REPAIR N/A 06/12/2016   Procedure: HERNIA REPAIR UMBILICAL ADULT;  Surgeon: Jerlean Mood, MD;  Location: [facility];  Service: General;  Laterality: N/A;   URETEROSCOPY Right 06/12/2016   Procedure: URETEROSCOPY;  Surgeon: Dustin Gimenez, MD;  Location: [facility];  Service: Urology;  Laterality: Right;   URETEROSCOPY WITH HOLMIUM LASER LITHOTRIPSY Right 11/10/2019  Procedure: URETEROSCOPY WITH HOLMIUM LASER LITHOTRIPSY;  Surgeon: Dustin Gimenez, MD;  Location: [facility];  Service: Urology;  Laterality: Right;    Family History  Problem Relation Age of Onset   Cancer Mother    Prostate cancer Maternal Uncle    Prostate cancer Paternal Uncle    Prostate cancer Cousin    Congestive Heart Failure Father    Kidney cancer Neg Hx    Bladder Cancer Neg Hx    Social History   Tobacco Use   Smoking status: Former    Current packs/day: 0.00    Types: Cigarettes    Start date: 05/29/1960    Quit date: 05/29/1990    Years since quitting: 33.3    Passive exposure: Past   Smokeless tobacco: Former    Types: Chew    Quit date: 1992  Vaping Use   Vaping status: Never Used  Substance Use Topics   Alcohol use: Not Currently    Comment: rarely   Drug use: No    Allergies  Allergen Reactions   Actos [Pioglitazone]  Palpitations    Pt reported a MI    Glimepiride Rash    Shortness of breath also   Glipizide Shortness Of Breath   Metformin Shortness Of Breath    Pt states he "can't breathe and it gives him sinus congestion".    Levofloxacin  Other (See Comments)    States it "attacked my joints" and couldn't walk.   Lisinopril Cough   Versed  [Midazolam ]     Over-sedation.  Hard to clear from system.   Amoxicillin Nausea Only   Empagliflozin Rash    Rash and itching   Erythromycin Base Nausea Only   Esomeprazole Magnesium Other (See Comments)    Makes patients heart burn worse   Lipitor [Atorvastatin]     indigestion   Liraglutide Other (See Comments)    bloating   Oxycodone  Nausea Only    Pt states he was severely and acutely nauseated and bloated   Pt states he was severely and acutely nauseated and bloated   Penicillins Nausea Only    Has patient had a PCN reaction causing immediate rash, facial/tongue/throat swelling, SOB or lightheadedness with hypotension: no Has patient had a PCN reaction causing severe rash involving mucus membranes or skin necrosis: no Has patient had a PCN reaction that required hospitalization:no Has patient had a PCN reaction occurring within the last 10 years: no If all of the above answers are "NO", then may proceed with Cephalosporin use.    Sitagliptin Other (See Comments)    Pt states it "gives him joint pain" & redness     Current Meds  Medication Sig   aspirin  EC 81 MG tablet Take 81 mg by mouth daily. Swallow whole.   clobetasol cream (TEMOVATE) 0.05 % Apply 1 application topically 2 (two) times daily as needed (psoriasis).    fluocinonide cream (LIDEX) 0.05 % Apply 1 application topically as needed.   fluticasone  (FLONASE) 50 MCG/ACT nasal spray Place into both nostrils daily as needed for allergies or rhinitis.   Fluticasone -Umeclidin-Vilant 100-62.5-25 MCG/ACT AEPB Inhale into the lungs.   hydrocortisone  (CORTEF ) 5 MG tablet Take by mouth.     Objective: BP 129/76   Pulse 69   Ht 5\' 9"  (1.753 m)   Wt 226 lb (102.5 kg)   BMI 33.37 kg/m   Physical Exam:  General: Alert and oriented. and No acute distress. Gait: Slow, steady gait.  Tenderness to palpation along the lower back.  Negative  straight leg raise.  He has excellent lower extremity strength.  Sensation is intact, with the exception of some tingling in both feet.  IMAGING: I personally ordered and reviewed the following images   X-rays of the lumbar spine were available in clinic today.  Diffuse degenerative changes.  There is loss of disc height at different levels.  Chronic appearing deformity in the thoracolumbar junction, which could represent a mild compression fracture.  No anterolisthesis.   New Medications:  No orders of the defined types were placed in this encounter.     Tonita Frater, MD  09/20/2023 10:49 AM

## 2023-09-24 ENCOUNTER — Encounter: Payer: Self-pay | Admitting: Orthopedic Surgery

## 2023-09-24 ENCOUNTER — Ambulatory Visit: Admitting: Orthopedic Surgery

## 2023-09-24 VITALS — BP 138/78 | Ht 69.0 in | Wt 226.0 lb

## 2023-09-24 DIAGNOSIS — M542 Cervicalgia: Secondary | ICD-10-CM | POA: Diagnosis not present

## 2023-09-24 DIAGNOSIS — M5136 Other intervertebral disc degeneration, lumbar region with discogenic back pain only: Secondary | ICD-10-CM | POA: Diagnosis not present

## 2023-09-24 DIAGNOSIS — M47816 Spondylosis without myelopathy or radiculopathy, lumbar region: Secondary | ICD-10-CM | POA: Diagnosis not present

## 2023-09-24 NOTE — Patient Instructions (Signed)
 It was so nice to see you today. Thank you so much for coming in.    Your lower back xrays show some arthritis (wear and tear). This is likely causing your back pain.   I want to get an MRI of your lower back to look into things further. We will get this approved through your insurance and [facility] will call you to schedule the appointment. Ask about your patient responsibility. You do not need to pay this prior to getting MRI, they can bill you.   When you have the MRI of your back, remind them to do xrays of your neck as well.   [facility] (building with the white pillars) is located off of [address]. The address is [address], [place], [place] [address].    After you have the MRI and xrays, it takes 14-21 days for me to get the results back. Once I have them, we will call you to schedule a follow up visit with me to review them.   Follow up with ENT regarding swelling behind your ear.   Please do not hesitate to call if you have any questions or concerns. You can also message me in MyChart.   Lucetta Russel PA-C [phone]     The physicians and staff at [facility] are committed to providing excellent care. You may receive a survey asking for feedback about your experience at our office. We value you your feedback and appreciate you taking the time to to fill it out. The [facility] leadership team is also available to discuss your experience in person, feel free to contact us  [phone].

## 2023-09-26 NOTE — Unmapped (Signed)
 [facility] Return Visit    Assessment/Plan:     William Keller is a 83 y.o. male presenting to [facility] for follow up- I will see him twice a year for geriatrics consultations now.    Assessment & Plan  Type 2 Diabetes Mellitus with peripheral neuropathy  Diabetes well-managed. Previous GLP-1 receptor agonists caused nausea and dizziness. Considering semaglutide  tablet due to intolerance to injections and he wants to re-try the GLP 1 agonist but in a different formulation  - Prescribe semaglutide  3 mg once daily in the morning.  - Monitor blood sugars closely; reduce NPH insulin  from 26 units to 24 units twice a day if blood sugars fall below 100 mg/dL.  - Discuss potential side effects of semaglutide , including nausea and dizziness.  - Send prescription to [facility] pharmacy.    Obesity, re-trying GLP 1 agonist as above    Insomnia, fatigue, snoring, declines sleep study    Dupuytren's contracture  Dupuytren's contracture causing hand stiffness and pain, common in diabetes.  - Consider referral to orthopedist for release procedure if symptoms worsen.  - Advise use of Voltaren  gel on affected areas three times a day.    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  - Recommend Voltaren  gel 1% three times a day on neck and hands.  - Advise icing affected areas three times a day.  - Provide neck arthritis exercises.  - Suggest regular use of Tylenol , 1000 mg twice a day, for pain management.  - Discuss potential referral to physical therapy if symptoms persist  but he has declined for now    Primary hypogonadism  Low testosterone  levels previously managed with testosterone  gel. Improved energy levels reported with gel use.  - Prescribe testosterone  gel, 20.25 mg daily, applied to the shoulder.  - Schedule follow-up in three months to assess testosterone  levels and symptoms.  - Monitor for potential side effects, including elevated hemoglobin levels.    CAD, hx of stent and intolerance of statin/zetia , encouraged him to follow up with cardiology about repatha     Follow-up  Regular follow-up necessary for diabetes, testosterone  levels, and arthritis symptoms. Prefers quarterly visits.  - Schedule follow-up appointment in three months with morning labs to include testosterone  levels.  - Coordinate with cardiologist regarding Repatha  for hyperlipidemia management.  - Advise him to contact cardiologist's office for prior authorization of Repatha  if needed.        No orders of the defined types were placed in this encounter.        RTC: No follow-ups on file.    ------------------------------------------------------------------------------------------------------------  Subjective     HPI: William Keller is a 83 y.o. male who returns to [facility] for follow up.  Please see HPI as per A&P above.  He comes to the visit today with his wife Subhaan Estep.     New PCP in [place]- Rueben Cote who is closer to his home and easier to get in to see    He wants to see me regularly for now for geriatric consultations help with managing DM2, hypogonadism as he does not regularly see endocrinology at this time    Shx, has cows, chickens and a dog on his land and walks around 1/2 mile daily feeding and caring for his animals    History of Present Illness  William Keller is an 83 year old male with diabetes who presents with medication management and concerns about  arthritis pain.    He experiences significant discomfort from arthritis, particularly in his neck and hands, described as tightness and stiffness. He has tried ice and tiger balm for relief. He also has Dupuytren's contracture in his hands, causing stiffness and pain, especially after physical activity. He experiences stiffness after sitting for short periods and has joint pains without radiation down his legs.    He is seeking alternative diabetes medication due to adverse reactions to previous treatments. He has tried Wegovy  and Ozempic , both causing nausea and dizziness. He is currently on NPH insulin , 26 units twice a day, with stable blood sugars ranging from 114 to 140 in the morning.    He has a history of low testosterone , previously managed with a testosterone  gel, which he stopped after consulting an endocrinologist. He reports low energy, concerns about muscle loss, and erectile dysfunction. His testosterone  levels have been low, with past readings around 130.    He has a history of exposure to heavy metals, including lead, aluminum, mercury, and arsenic, from past work with treated lumber. He is no longer doing this type of work He expresses concern about potential lingering effects but is not experiencing new neuropathy symptoms, no abdominal pain, confusion and the arthralgias are overall chronic    He recently had boils on his abdomen, which have mostly resolved with a cream provided by his primary care provider. He describes them as recurring and likened them to blood blisters.    He mentions a history of sleep apnea symptoms, including fatigue and possibly snoring, but was unable to complete a sleep study in the past due to discomfort with the testing environment.        ROS: Pertinent positives and negatives are documented as per the HPI.    I have reviewed and (if needed) updated the patient's problem list, medications, allergies, past medical and surgical history, social and family history in the EMR.    Objective     Physical Exam   Vitals:   There were no vitals filed for this visit.          Wt Readings from Last 3 Encounters:   08/06/23 (!) 103.1 kg (227 lb 6.4 oz)   05/11/23 (!) 101 kg (222 lb 9.6 oz)   05/08/23 100.5 kg (221 lb 8 oz)     Wt Readings from Last 20 Encounters:   08/06/23 (!) 103.1 kg (227 lb 6.4 oz)   05/11/23 (!) 101 kg (222 lb 9.6 oz)   05/08/23 100.5 kg (221 lb 8 oz)   05/07/23 100.2 kg (221 lb)   03/26/23 (!) 100.8 kg (222 lb 3.2 oz) 01/21/23 100.2 kg (221 lb)   12/25/22 100.4 kg (221 lb 6.4 oz)   11/08/22 99.3 kg (219 lb)   08/15/22 98.9 kg (218 lb)   07/26/22 97.9 kg (215 lb 12.8 oz)   04/11/22 98.3 kg (216 lb 12.8 oz)   03/02/22 98 kg (216 lb)   12/23/21 97.5 kg (215 lb)   11/16/21 98 kg (216 lb)   11/15/21 98.9 kg (218 lb)   11/15/21 99.2 kg (218 lb 9.6 oz)   10/27/21 (!) 101.1 kg (222 lb 12.8 oz)   10/10/21 100.6 kg (221 lb 12.8 oz)   09/15/21 100.2 kg (221 lb)   09/12/21 (!) 101.7 kg (224 lb 4.8 oz)        Gen: tired but in no distress  Eyes: conjunctiva clear, no lower lid pallor  Neck, pain on palpation of trapezius  muscle.  Mild subcutaneous knot vs cyst palpated without significant pain  Resp: Normal WOB, CTAB and no adventitious sounds  CV: RRR, no murmurs or gallops, distal pulses 2+ symmetric,1+ edema symmetric at ankles and lower shins  Neuro: Stand with use of hands and slower gait speed with imbalance      PHQ-2 Score:         Labs and Studies in EMR and Reviewed    I personally spent 30 minutes face-to-face and non-face-to-face in the care of this patient, which includes all pre, intra, and post visit time on the date of service.  All documented time was specific to the E/M visit and does not include any procedures that may have been performed.

## 2023-09-26 NOTE — Unmapped (Addendum)
 Start semaglutide  3 mg daily for diabetes and weight loss    If you develop blood sugars less than 100, lower insulin  to 24 units twice daily    Re-start testosterone  every morning    Try these neck arthritis exercises    Take tylenol  1000 mg twice daily    Try ice 3 times a day and voltaren  gel 1% 3 times a day to your neck and hands    3 month follow up- labs at next visit

## 2023-09-27 ENCOUNTER — Ambulatory Visit
Admit: 2023-09-27 | Discharge: 2023-09-28 | Payer: Medicare (Managed Care) | Attending: Internal Medicine | Primary: Internal Medicine

## 2023-09-27 DIAGNOSIS — Z6832 Body mass index (BMI) 32.0-32.9, adult: Principal | ICD-10-CM

## 2023-09-27 DIAGNOSIS — N500 Atrophy of testis: Principal | ICD-10-CM

## 2023-09-27 DIAGNOSIS — M5136 Degeneration of intervertebral disc of lumbar region with discogenic back pain: Principal | ICD-10-CM

## 2023-09-27 DIAGNOSIS — E66811 Class 1 obesity with alveolar hypoventilation and body mass index (BMI) of 32.0 to 32.9 in adult, unspecified whether serious comorbidity present (CMS-HCC): Principal | ICD-10-CM

## 2023-09-27 DIAGNOSIS — M5030 Other cervical disc degeneration, unspecified cervical region: Principal | ICD-10-CM

## 2023-09-27 DIAGNOSIS — I2510 Atherosclerotic heart disease of native coronary artery without angina pectoris: Principal | ICD-10-CM

## 2023-09-27 DIAGNOSIS — E662 Morbid (severe) obesity with alveolar hypoventilation: Principal | ICD-10-CM

## 2023-09-27 DIAGNOSIS — E1142 Type 2 diabetes mellitus with diabetic polyneuropathy: Principal | ICD-10-CM

## 2023-09-27 DIAGNOSIS — Z794 Long term (current) use of insulin: Principal | ICD-10-CM

## 2023-09-27 MED ORDER — TESTOSTERONE 1.62 % (20.25 MG/1.25 GRAM) TRANSDERMAL GEL PACKET
PACK | Freq: Every day | TRANSDERMAL | 3 refills | 0.00000 days | Status: CP
Start: 2023-09-27 — End: ?

## 2023-09-27 MED ORDER — SEMAGLUTIDE 3 MG TABLET
ORAL_TABLET | Freq: Every day | ORAL | 3 refills | 30.00000 days | Status: CP
Start: 2023-09-27 — End: ?

## 2023-09-28 NOTE — Unmapped (Signed)
 PA-Testosterone  20.25 MG/1.25GM(1.62%) gel processed via covermymeds. Please allow 72 hours for determination.

## 2023-09-28 NOTE — Unmapped (Signed)
 PA-Testosterone  20.25 MG/1.25GM(1.62%) gel processed via covermymeds. Please allow 72 hours for determination.     Above is approved by [facility] and they will send a FAx

## 2023-10-26 MED ORDER — TRELEGY ELLIPTA 100 MCG-62.5 MCG-25 MCG POWDER FOR INHALATION
0 refills | 0.00000 days
Start: 2023-10-26 — End: ?

## 2023-11-01 MED ORDER — TRELEGY ELLIPTA 100 MCG-62.5 MCG-25 MCG POWDER FOR INHALATION
0 refills | 0.00000 days
Start: 2023-11-01 — End: ?

## 2023-11-01 MED ORDER — FLUTICASONE FUR. 100 MCG-UMECLID 62.5 MCG-VILANT 25 MCG INHALAT.POWDER
Freq: Every day | RESPIRATORY_TRACT | 3 refills | 90.00000 days | Status: CP
Start: 2023-11-01 — End: 2024-10-26

## 2023-11-01 NOTE — Unmapped (Signed)
 Opened in error

## 2023-12-30 NOTE — Unmapped (Signed)
 [facility] Return Visit    Assessment/Plan:     William Keller is a 83 y.o. male presenting to [facility] for follow up.    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    Mild seborrheic dermatitis with ineffective previous treatments.  - Prescribe fluocinonide  (Lidex ) ointment for ears and scalp, apply twice daily.  - continue ketoconazole  shampoo  - Advise not to apply ointment to face or neck to avoid skin thinning.    Tinea cruris (fungal infection of groin)  Mild itching in groin, likely fungal, with ineffective previous treatments.  - Prescribe ketoconazole  ointment for groin, apply once daily.    Onychomycosis, localized L great toe  Fungal infection of toenail noted.  - Recommend applying Vicks Vaporub to affected toenail at night.  - Consider prescribing Penlac if no improvement with Vicks Vaporub.    Type 2 diabetes mellitus with insulin  therapy and obesity, at goal < 7.5%  Blood sugars generally well-controlled with occasional highs. Experiencing reduced appetite and weight loss with GLP 1 agonist  - Reduce NPH insulin  to 22 units twice daily as he has been occasionally holding his NPH 26 units BID for BS less than 120 although no BS less than 100 reported  - Continue Rybelsus  3 mg daily, he opted against trial of increasing today  - Perform A1c test today.  - Monitor blood sugar levels at home and report in two weeks.    Bloating and gas, hx of irritable bowel syndrome (IBS)  Persistent bloating and gas, possibly due to IBS. Previous prescription for rifaximin  was too expensive.  - Message Dr. Filbert to discuss alternative antibiotic options due to cost of rifaximin .  - Advise him to contact Dr. Filbert via MyChart for further management.  - Continue miralax  for constipation    Primary hypogonadism on testosterone  therapy  Currently on testosterone  gel 20.25 mg daily.  - Update testosterone  gel prescription to a 90-day supply.  - Testosterone  levels and hemoglobin today    History of bladder cancer, overdue for cystoscopy and previous urologist noted urothelial abnormality and wanted urogram prior to next appt  Overdue for follow-up with urology and CT urogram. Previous urologist retired.  - Refer to [facility] at [facility]   - Order CT urogram prior to urology appointment.    Mild neuropathy, likely from diabetes, given FHx will send SPEP    HCM: Declined vaccines today. Colonoscopy normal 2021. Hearing testing completed and has hearing aids.      ACP: FULL code, but does not want prolonged life support.  Surrogate decision maker is his wife William Keller  Orders Placed This Encounter   Procedures    CT Urogram    Hemoglobin A1c    CBC    Testosterone , free, total    Serum Protein Electrophoresis and Immunofixation    Serum Free Light Chains    Ambulatory referral to Urology         RTC: Return in about 3 months (around 04/01/2024).  Discussed again in follow-up vaccines including flu COVID shot    ------------------------------------------------------------------------------------------------------------  Subjective     HPI: William Keller is a 83 y.o. male who [facility] to [facility] for follow up.  Please also see HPI as per A&P above.  He has a local PCP but sees me regularly     Hx of bladder cancer s/p TURBT in 2018  Has annual cystoscopy with outside urology last one 06/2022  Last  one showed abnormality right urothelial tract   They recommended Follow-up in 4 months with CT urogram prior  Rosina Riis, MD  History of Present Illness  William Keller is an 83 year old male with diabetes and neuropathy who presents with scalp and ear itching.    He has persistent itching in his scalp and ears, which has not responded to previous dermatological treatments. He has been using ketoconazole  shampoo, which he finds expensive, but it has not resolved the issue. He recalls being given ear drops in the past, but it has been a while since he used them.    He also experiences itching in his navel and groin area, which he suspects to be fungal. He has tried various treatments without success and is interested in trying a ketoconazole  ointment for this issue.    He inquires about amyloidosis due to a family history of the condition, although no family members have died from it. He experiences some neuropathy in his feet. He denies increased shortness of breath and reports a history of lung disease. He has a history of carpal tunnel syndrome, which he believes was work-related and is not currently bothersome.    He discusses bloating and indigestion, which he attributes to bacterial overgrowth as suggested by another doctor. He was prescribed rifaximin , which he did not purchase due to cost. He experiences a lot of gas and bloating regardless of diet and has had some constipation, for which he occasionally uses Miralax .    He is currently on a daily testosterone  gel and a semaglutide  tablet for diabetes and weight loss. He reports a reduced appetite and some weight loss, which he attributes to the medication. He reports his blood sugars have been pretty good and he adjusts his insulin  use based on his morning blood sugar levels, typically using 26 units twice a day unless his blood sugar is below 120.    He has a history of bladder cancer and is overdue for a follow-up with urology. He mentions a previous urologist, Dr. Rosina Riis, who has retired, and he is considering seeing a new urologist at [facility]. He recalls a past procedure for kidney stones and a Urolift procedure.    He is not currently taking Protonix  for acid reflux, as he is doing well following a previous fundoplication surgery. He maintains an active lifestyle, working in his garden and walking on his property. He reports back pain.      ROS: Pertinent positives and negatives are documented as per the HPI.    I have reviewed and (if needed) updated the patient's problem list, medications, allergies, past medical and surgical history, social and family history in the EMR.    Objective     Physical Exam   Vitals:   Vitals:    12/31/23 0907   BP: 149/78   Pulse: 68   Temp: 36.6 ??C (97.9 ??F)     Wt Readings from Last 3 Encounters:   08/06/23 (!) 103.1 kg (227 lb 6.4 oz)   05/11/23 (!) 101 kg (222 lb 9.6 oz)   05/08/23 100.5 kg (221 lb 8 oz)     Wt Readings from Last 20 Encounters:   08/06/23 (!) 103.1 kg (227 lb 6.4 oz)   05/11/23 (!) 101 kg (222 lb 9.6 oz)   05/08/23 100.5 kg (221 lb 8 oz)   05/07/23 100.2 kg (221 lb)   03/26/23 (!) 100.8 kg (222 lb 3.2 oz)   01/21/23 100.2 kg (221 lb)  12/25/22 100.4 kg (221 lb 6.4 oz)   11/08/22 99.3 kg (219 lb)   08/15/22 98.9 kg (218 lb)   07/26/22 97.9 kg (215 lb 12.8 oz)   04/11/22 98.3 kg (216 lb 12.8 oz)   03/02/22 98 kg (216 lb)   12/23/21 97.5 kg (215 lb)   11/16/21 98 kg (216 lb)   11/15/21 98.9 kg (218 lb)   11/15/21 99.2 kg (218 lb 9.6 oz)   10/27/21 (!) 101.1 kg (222 lb 12.8 oz)   10/10/21 100.6 kg (221 lb 12.8 oz)   09/15/21 100.2 kg (221 lb)   09/12/21 (!) 101.7 kg (224 lb 4.8 oz)       Physical Exam  VITALS: BP- 149/78  HEENT: Mild scales in ears. Light scale on scalp. Papule on right side of scalp. Eyes not jaundiced.  He is well-appearing and he ambulates without assistive device and does not need to use his hands to stand  Regular rate and rhythm  Normal work of breathing and lungs are clear to auscultation bilaterally  Bilateral lower extremities with no edema  Dorsalis pedis pulses 1+ symmetric.  Feet are warm and well-perfused  Monofilament testing normal except missed 1 spot on the plantar surface of midfoot on the left.  Onychomycosis of left nail and his nail is cut at an angle      Labs and Studies in EMR and Reviewed    Results         I personally spent 40 minutes face-to-face and non-face-to-face in the care of this patient, which includes all pre, intra, and post visit time on the date of service.

## 2023-12-31 ENCOUNTER — Ambulatory Visit
Admit: 2023-12-31 | Discharge: 2023-12-31 | Payer: Medicare (Managed Care) | Attending: Internal Medicine | Primary: Internal Medicine

## 2023-12-31 ENCOUNTER — Ambulatory Visit: Admit: 2023-12-31 | Discharge: 2023-12-31 | Payer: Medicare (Managed Care)

## 2023-12-31 ENCOUNTER — Encounter
Admit: 2023-12-31 | Discharge: 2023-12-31 | Payer: Medicare (Managed Care) | Attending: Internal Medicine | Primary: Internal Medicine

## 2023-12-31 DIAGNOSIS — Z794 Long term (current) use of insulin: Principal | ICD-10-CM

## 2023-12-31 DIAGNOSIS — G629 Polyneuropathy, unspecified: Principal | ICD-10-CM

## 2023-12-31 DIAGNOSIS — E1142 Type 2 diabetes mellitus with diabetic polyneuropathy: Principal | ICD-10-CM

## 2023-12-31 DIAGNOSIS — N500 Atrophy of testis: Principal | ICD-10-CM

## 2023-12-31 DIAGNOSIS — E662 Morbid (severe) obesity with alveolar hypoventilation: Principal | ICD-10-CM

## 2023-12-31 DIAGNOSIS — R7301 Impaired fasting glucose: Principal | ICD-10-CM

## 2023-12-31 DIAGNOSIS — N398 Other specified disorders of urinary system: Principal | ICD-10-CM

## 2023-12-31 DIAGNOSIS — Z8551 Personal history of malignant neoplasm of bladder: Principal | ICD-10-CM

## 2023-12-31 DIAGNOSIS — Z6832 Body mass index (BMI) 32.0-32.9, adult: Principal | ICD-10-CM

## 2023-12-31 DIAGNOSIS — Z87442 Personal history of urinary calculi: Principal | ICD-10-CM

## 2023-12-31 DIAGNOSIS — E66811 Class 1 obesity with alveolar hypoventilation and body mass index (BMI) of 32.0 to 32.9 in adult, unspecified whether serious comorbidity present (CMS-HCC): Principal | ICD-10-CM

## 2023-12-31 LAB — CBC
HEMATOCRIT: 46 % (ref 39.0–48.0)
HEMOGLOBIN: 15.3 g/dL (ref 12.9–16.5)
MEAN CORPUSCULAR HEMOGLOBIN CONC: 33.3 g/dL (ref 32.0–36.0)
MEAN CORPUSCULAR HEMOGLOBIN: 30.5 pg (ref 25.9–32.4)
MEAN CORPUSCULAR VOLUME: 91.5 fL (ref 77.6–95.7)
MEAN PLATELET VOLUME: 9.5 fL (ref 6.8–10.7)
PLATELET COUNT: 210 10*9/L (ref 150–450)
RED BLOOD CELL COUNT: 5.03 10*12/L (ref 4.26–5.60)
RED CELL DISTRIBUTION WIDTH: 12.5 % (ref 12.2–15.2)
WBC ADJUSTED: 6.5 10*9/L (ref 3.6–11.2)

## 2023-12-31 LAB — HEMOGLOBIN A1C
ESTIMATED AVERAGE GLUCOSE: 154 mg/dL
HEMOGLOBIN A1C: 7 % — ABNORMAL HIGH (ref 4.8–5.6)

## 2023-12-31 LAB — SERUM FREE LIGHT CHAINS
K/L FLC RATIO: 1.3 (ref 0.26–1.65)
KAPPA FREE,SERUM: 3.54 mg/dL — ABNORMAL HIGH (ref 0.33–1.94)
LAMBDA FREE, SER: 2.73 mg/dL — ABNORMAL HIGH (ref 0.57–2.63)

## 2023-12-31 MED ORDER — KETOCONAZOLE 2 % TOPICAL CREAM
Freq: Every day | TOPICAL | 1 refills | 30.00000 days | Status: CP
Start: 2023-12-31 — End: 2024-01-14

## 2023-12-31 MED ORDER — INSULIN HUMAN U-100 NPH-REGULR 70-30 MIX 100 UNIT/ML SUBCUTANEOUS SUSP
Freq: Two times a day (BID) | SUBCUTANEOUS | 3 refills | 91.00000 days
Start: 2023-12-31 — End: 2024-03-30

## 2023-12-31 MED ORDER — FLUOCINONIDE 0.05 % TOPICAL OINTMENT
Freq: Two times a day (BID) | TOPICAL | 1 refills | 0.00000 days | Status: CP | PRN
Start: 2023-12-31 — End: 2024-12-30

## 2023-12-31 MED ORDER — SEMAGLUTIDE 3 MG TABLET
ORAL_TABLET | Freq: Every day | ORAL | 3 refills | 90.00000 days | Status: CP
Start: 2023-12-31 — End: ?

## 2023-12-31 NOTE — Unmapped (Addendum)
 Ketoconazole  cream to groin daily for 2 weeks    Lidex  ointment twice daily as needed to scale / itching areas of ears and scalp- do not apply to face/neck    Continue ketoconazole  shampoo 2-3 times a week, leave in for 5 minutes before washing out    Blood work today    Monitor blood pressure 2-3 times a week at home, goal blood pressure less than 130/80    Reduce insulin  to 22 units twice daily    Referral to urology, please schedule CT urogram before you see them    [facility] [phone] (Use Option 1 for CT Scan, Bone Density Scan (DEXA), MRI, or Ultrasound)

## 2024-01-01 LAB — SERUM PROTEIN ELECTROPHORESIS AND IMMUNOFIXATION
ALBUMIN (SPE): 4.9 g/dL (ref 3.5–5.0)
ALPHA-1 GLOBULIN: 0.2 g/dL (ref 0.2–0.5)
ALPHA-2 GLOBULIN: 0.7 g/dL (ref 0.5–1.1)
BETA-1 GLOBULIN: 0.5 g/dL (ref 0.3–0.6)
BETA-2 GLOBULIN: 0.4 g/dL (ref 0.2–0.6)
GAMMAGLOBULIN: 1.1 g/dL (ref 0.5–1.5)
PROTEIN TOTAL (SPECIAL CHEM): 7.8 g/dL

## 2024-01-07 DIAGNOSIS — K589 Irritable bowel syndrome without diarrhea: Principal | ICD-10-CM

## 2024-01-07 MED ORDER — METRONIDAZOLE 250 MG TABLET
ORAL_TABLET | Freq: Three times a day (TID) | ORAL | 0 refills | 14.00000 days
Start: 2024-01-07 — End: 2024-01-21

## 2024-01-07 NOTE — Unmapped (Signed)
 Received message from Dr. Filbert who was informed by Dr. Melodye that pt was unable to obtain rifaximin  with insurance due to cost. Dr. Filbert suggests Flagyl  250 mg 3 times daily if pt willing.  Called pt and let him know Dr. Filbert has a suggestion for an alternate medication. Advised I'd send a MyChart message or he can call me to discuss. Left my phone number and sent MyChart message as well.

## 2024-01-08 ENCOUNTER — Encounter
Admit: 2024-01-08 | Discharge: 2024-01-08 | Payer: Medicare (Managed Care) | Attending: Internal Medicine | Primary: Internal Medicine

## 2024-01-08 ENCOUNTER — Inpatient Hospital Stay: Admit: 2024-01-08 | Discharge: 2024-01-08 | Payer: Medicare (Managed Care)

## 2024-01-08 DIAGNOSIS — N3289 Other specified disorders of bladder: Principal | ICD-10-CM

## 2024-01-08 MED ADMIN — iohexol (OMNIPAQUE) 350 mg iodine/mL solution 100 mL: 100 mL | INTRAVENOUS | @ 19:00:00 | Stop: 2024-01-08

## 2024-01-09 MED ORDER — METRONIDAZOLE 250 MG TABLET
ORAL_TABLET | Freq: Three times a day (TID) | ORAL | 0 refills | 14.00000 days | Status: CP
Start: 2024-01-09 — End: 2024-01-23

## 2024-01-09 NOTE — Unmapped (Signed)
 Received VM from pt asking about the medication-he has not read my MyChart message. Returned call. Again left message. Sent rx to [facility] and will continue to try to reach him.

## 2024-01-09 NOTE — Unmapped (Signed)
 Called pt (4th attempt). Left message stating a medicine from Dr. Filbert was sent to [facility] and a MyChart message was sent to explain that. Call was picked up and disconnected before I could leave a phone number.

## 2024-01-10 DIAGNOSIS — N500 Atrophy of testis: Principal | ICD-10-CM

## 2024-01-10 LAB — TESTOSTERONE, FREE, TOTAL
TESTOSTERONE, FREE, (DIALYSIS): 11.3 pg/mL — ABNORMAL LOW
TESTOSTERONE, TOTAL, MS: 71 ng/dL — ABNORMAL LOW

## 2024-01-10 MED ORDER — TESTOSTERONE 1.62 % (20.25 MG/1.25 GRAM) TRANSDERMAL GEL PACKET
PACK | Freq: Every day | TRANSDERMAL | 3 refills | 0.00000 days | Status: CP
Start: 2024-01-10 — End: ?

## 2024-02-12 DIAGNOSIS — E785 Hyperlipidemia, unspecified: Principal | ICD-10-CM

## 2024-02-12 DIAGNOSIS — Z789 Other specified health status: Principal | ICD-10-CM

## 2024-02-12 DIAGNOSIS — I2510 Atherosclerotic heart disease of native coronary artery without angina pectoris: Principal | ICD-10-CM

## 2024-02-12 MED ORDER — REPATHA SURECLICK 140 MG/ML SUBCUTANEOUS PEN INJECTOR
SUBCUTANEOUS | 3 refills | 0.00000 days | Status: CP
Start: 2024-02-12 — End: ?

## 2024-02-12 MED ORDER — LISINOPRIL 20 MG TABLET
ORAL_TABLET | Freq: Every day | ORAL | 6 refills | 30.00000 days | Status: CP
Start: 2024-02-12 — End: 2025-03-18

## 2024-02-12 NOTE — Unmapped (Signed)
 Assessment/Plan     Assessment & Plan  Atypical chest pain with history of coronary artery disease  Recent hospital admission for atypical chest pain with negative troponins and stress testing showing a large fixed defect but no ischemia. No myocardial infarction history. Chest pain not attributed to coronary blockages. Metoprolol prescribed to manage heart rate and reduce chest pain episodes, with benefits including decreased episodes and improved survival in myocardial infarction cases.  - Continue metoprolol to manage heart rate and reduce chest pain episodes.  - Continue aspirin therapy.    Hyperlipidemia with suboptimal LDL control, status post PCSK9 inhibitor nonadherence  Suboptimal LDL control due to nonadherence to PCSK9 inhibitor. Previous intolerance to statins. Plan to restart PCSK9 inhibitor to improve cholesterol management. Medication will be mailed from [facility] specialty pharmacy to avoid confusion with local pharmacy.  - Send prescription for PCSK9 inhibitor to [facility] specialty pharmacy for mail delivery.    Hypertension with variable home blood pressures  Variable home blood pressures with recent readings averaging in the 140s, occasionally reaching 150s. Blood pressure management suboptimal, likely due to nonadherence to lisinopril . Plan to increase lisinopril  dosage to improve blood pressure control. Explained that blood pressure naturally varies throughout the day and with age, vessels become less elastic, contributing to variability.  - Increase lisinopril  to 20 mg daily.  - Monitor blood pressure at home; report if systolic blood pressure is less than 100.  - Provide contact information for reporting side effects such as dry cough.    Follow-Up  Follow-up needed to assess response to medication adjustments and overall management of conditions.  - Schedule follow-up appointment for the first of the year.     Return in about 6 months (around 08/11/2024) for with Cassie.    Subjective:   ERE:Opwiozb, Channing Floss, FNP    History of Present Illness  William Keller is an 83 year old male with coronary artery disease and hyperlipidemia who presents for hospital discharge follow-up after experiencing atypical chest pain.    He recently experienced atypical chest pain, arm pain, and bloating while in [place] , prompting a hospital admission. During his stay, a stress test showed a large fixed defect without ischemia, and troponin levels were negative.    He has a history of coronary artery disease and hyperlipidemia. A nuclear stress test on May 16, 2023, indicated a moderately sized fixed defect and coronary calcifications. An echocardiogram on May 18, 2023, showed an ejection fraction of 50% with a mildly dilated right ventricle and atrium. Despite being advised to restart Repatha  due to an LDL level of 169 mg/dL in August 2024, he has not been taking it consistently.    He reports fluctuating blood pressure readings, with a recent home measurement of 116/60 mmHg. He checks his blood pressure twice daily, sometimes at lunch, with an average in the 140s mmHg, occasionally reaching 150s mmHg. He has not been taking his prescribed lisinopril  or cholesterol pen (PCSK9 inhibitor) consistently. He was started on metoprolol during his hospital stay, which he is taking as a half tablet.    He has a history of dyspnea on exertion, previously evaluated with iron studies and anemia workup, showing no significant iron deficiency and appropriate hemoglobin levels as of March 2025. He has not experienced a heart attack, and his blood pressure has been well-controlled in the past without medication.       Past Medical History  Problem List[1]    Medications:  Current Medications[2]    Allergies  Allergies[3]  Social History:   Short Social History[4]    Family History:  Family History[5]    ROS- 12 system review is negative other than what is specified in the History of Present Illness.      Objective:   Physical Exam  Vitals:    02/12/24 1006   BP: 151/67   BP Position: Sitting   BP Cuff Size: X-Large   Pulse: 64   Temp: 36.1 ??C (97 ??F)   TempSrc: Temporal   SpO2: 97%   Weight: (!) 101.7 kg (224 lb 4.8 oz)   Height: 175.3 cm (5' 9)     Physical Exam  GENERAL: Alert, cooperative, well developed, no acute distress  Remainder of exam was deferred       Laboratory data:  Results  LABS  LDL: 169 (12/2022)  Troponins: Negative    DIAGNOSTIC  Nuclear stress test: Abnormal with moderately sized fixed defect and coronary calcifications (05/16/2023)  Echocardiogram: EF 50%, mildly dilated right ventricle, mildly dilated right atrium (05/18/2023)  Stress test: Large fixed defect with no ischemia (02/04/2024)         I have personally reviewed the following diagnostic studies.      Electrocardiogram:  From January 21, 2023 showed sinus rhythm with questionable Q waves in the inferior leads.     Echocardiogram:  From January 2018 at [facility] showed normal EF with no wall motion abnormality.      TTE 05/18/23   1. The left ventricle is normal in size with normal wall thickness.    2. The left ventricular systolic function is probably normal, LVEF is  visually estimated at 50%.    3. The mitral valve leaflets are mildly thickened with normal leaflet  mobility.    4. The right ventricle is mildly dilated in size, with normal systolic  function.    5. The right atrium is mildly dilated in size.     Nuclear stress test:  From [facility] 05/2016, patient exercised for 6 minutes and 25 seconds stopping for fatigue had no other symptoms.  EKG was nondiagnostic nuclear images showed inferior wall motion abnormality with fixed inferior perfusion defect.      05/16/23  -Abnormal myocardial perfusion study   -Moderate sized (approximately 4 segment) region of the inferior, inferoseptal, and inferolateral walls with fixed severely decreased perfusion, consistent with scar. No reversible ischemia is appreciated.   - Left ventricular systolic function is preserved. Post stress the ejection fraction is > 65%. The left ventricle is normal in size. Focal decreased wall motion in the inferior wall.   -Atherosclerotic calcifications in the coronary arteries. Multiple simple appearing cysts in the kidneys. Prior left posterior rib resections degenerative changes in the spine.         Cardiac catheterization:  Cath at [facility] from 03/31/2015 showed widely patent mid to distal RCA stent with otherwise nonobstructive disease in the LAD and circumflex.      CTA:  From 01/21/2023 showed: No emboli in either lung. Questionable changes in the right lower lobe are favored to reflect motion artifact. Main pulmonary artery is normal in size. Cardiac chambers are normal in size. No pericardial effusion. Aorta is normal in caliber. Calcified atherosclerotic disease within the coronary arterial distribution (moderate)                        [1]   Patient Active Problem List  Diagnosis    Asthma (HHS-HCC)    Brachial neuritis  Sinusitis, chronic    Chronic bronchitis    (CMS-HCC)    Type 2 diabetes mellitus with diabetic polyneuropathy, with long-term current use of insulin     (CMS-HCC)    Esophageal reflux    Hypogonadism    Obesity    Psoriasis and similar disorder    Hyperlipemia    Coronary artery disease involving native coronary artery of native heart without angina pectoris    Sensory hearing loss, bilateral    Abnormal findings on diagnostic imaging of lung    Allergic rhinitis    Arthritis    Degeneration of intervertebral disc of cervical region    Empty sella syndrome (HHS-HCC)    Benign prostatic hyperplasia with urinary frequency    History of kidney stones    Degenerative disc disease, lumbar    History of bladder cancer    Risk for falls   [2]   Current Outpatient Medications   Medication Sig Dispense Refill    aspirin (ECOTRIN) 81 MG tablet Take 1 tablet (81 mg total) by mouth daily.      clobetasoL  (TEMOVATE ) 0.05 % cream Apply topically Two (2) times a day. 30 g 0 ezetimibe  (ZETIA ) 10 mg tablet Take 1 tablet (10 mg total) by mouth daily.      fluocinolone acetonide oil 0.01 % Drop INSTILL 2 DROPS INTO AFFECTED EAR(S) TWICE DAILY AS NEEDED FOR ITCHING      fluocinonide  (LIDEX ) 0.05 % external solution Apply topically Two (2) times a day. 60 mL 1    fluocinonide  (LIDEX ) 0.05 % ointment Apply topically two (2) times a day as needed. 30 g 1    fluticasone  propionate (FLONASE ) 50 mcg/actuation nasal spray 2 sprays into each nostril daily. 16 g 11    fluticasone -umeclidin-vilanter (TRELEGY ELLIPTA ) 100-62.5-25 mcg inhaler Inhale 1 puff daily. 90 each 3    insulin  NPH-insulin  regular, 70/30, (HUMULIN /NOVOLIN) 100 unit/mL (70-30) injection Inject 0.22 mL (22 Units total) under the skin two (2) times a day. 40 mL 3    insulin  syr/ndl U100 half mark 0.5 mL 31 gauge x 15/64 (6 mm) Syrg Use to take insulin  as directed by Dr. Hosie 180 each 3    ketoconazole  (NIZORAL ) 2 % cream Apply 1 Application topically daily for 14 days. 15 g 1    ketoconazole  (NIZORAL ) 2 % shampoo WASH SCALP 3 TIMES A WEEK. LEAVE IT IN PLACE FOR 30 MINUTES BEFORE RINSING      metoPROLOL succinate (TOPROL-XL) 25 MG 24 hr tablet Take 15 mg by mouth daily.      nitroglycerin  (NITROSTAT ) 0.4 MG SL tablet Place 1 tablet (0.4 mg total) under the tongue every five (5) minutes as needed for chest pain. Maximum of 3 doses in 15 minutes.      pantoprazole  (PROTONIX ) 40 MG tablet Take 1 tablet (40 mg total) by mouth Two (2) times a day (30 minutes before a meal).      testosterone  1.62 % (20.25 mg/1.25 gram) GlPk Place 20.25 mg on the skin in the morning. 90 packet 3    evolocumab  (REPATHA  SURECLICK) 140 mg/mL PnIj Inject the contents of one pen (140 mg) under the skin every fourteen (14) days. 6 mL 3    lisinopril  (PRINIVIL ,ZESTRIL ) 20 MG tablet Take 1 tablet (20 mg total) by mouth daily. 30 tablet 6    LORATADINE  ORAL Take by mouth. (Patient not taking: Reported on 02/12/2024)      semaglutide  3 mg Tab Take 1 tablet (3 mg total) by mouth in  the morning. (Patient not taking: Reported on 02/12/2024) 90 tablet 3     No current facility-administered medications for this visit.   [3]   Allergies  Allergen Reactions    Glipizide Shortness Of Breath    Metformin Shortness Of Breath     Hospitalized for shortness of breath/asthma.  PATIENT FELT IT WAS GOING TO KILL HIM. WENT TO ED. HANDS WERE GREY.    Levofloxacin Muscle Pain     States it attacked my joints and couldn't walk.    Actos [Pioglitazone]      Pt states it gave him a heart attack    Crestor [Rosuvastatin] Other (See Comments)     indigestion    Esomeprazole Magnesium Other (See Comments)     Makes patients heart burn worse  Other reaction(s): Other (See Comments)  Makes patients heart burn worse  Makes patients heart burn worse  Makes patients heart burn worse      Liraglutide Other (See Comments)     bloating    Lisinopril  Cough    Midazolam Other (See Comments)     Doesn't do well with after effects     Penicillins Other (See Comments)    Sitagliptin      Other reaction(s): JOINT PAIN    Amoxicillin Nausea Only     Other reaction(s): NAUSEA    Atorvastatin      indigestion    Erythromycin Base Nausea Only     Other reaction(s): OTHER    Glimepiride Rash     Other reaction(s): RASH    Jardiance  [Empagliflozin ] Rash     Rash and itching    Oxycodone  Nausea Only     Pt states he was severely and acutely nauseated and bloated    [4]   Social History  Tobacco Use    Smoking status: Former     Current packs/day: 1.00     Average packs/day: 1 pack/day for 20.0 years (20.0 ttl pk-yrs)     Types: Cigarettes     Passive exposure: Past    Smokeless tobacco: Former     Quit date: 1992    Tobacco comments:     30 years ago   Psychologist, educational Use    Vaping status: Never Used   Substance Use Topics    Alcohol use: Not Currently     Comment: rarely     Drug use: No   [5]   Family History  Problem Relation Age of Onset    Cancer Mother         throat cancer    Emphysema Father     Melanoma Neg Hx Basal cell carcinoma Neg Hx     Squamous cell carcinoma Neg Hx

## 2024-02-13 NOTE — Unmapped (Signed)
 [facility] Gastroenterology  Return Visit       Reason for Visit: Bloating and GERD  Referring Provider: Lestine Raeann Haws   Primary Care Provider: Donal Channing Floss, FNP    Assessment & Plan: William Keller is a 83 y.o. male with  CAD, GERD prior Nissen fundoplication, IBS, PUD who presents for GERD, bloating.      # Bloating  # Irritable bowel syndrome  Persistent bloating and flatulence, with occasional constipation but bloating symptoms mostly unrelated to intermittent constipation. Empirically treated for SIBO with metronidazole  without improvement in symptoms. On PPI for GERD as below. Symptoms could be post-prandial bloating from IBS, gut sensitivity, diet-related  - discussed low FODMAP diet and keeping food diary  - discussed trial of IBgard  - continue with PRN Miralax  for constipation    # Gastroesophageal reflux disease without esophagitis, acquired esophageal diverticulum  # PUD  Prior EGD with diverticulum and Forrest class III ulcers in 2021. Esophageal diverticulae are rare and don't always have a clearcut cause. The most common cause is a spastic motility disorder, and the most common cause of that is GERD, which could make sense given his recurrent small hiatus hernia. Overall, his GERD symptoms sound compensated for with lifestyle modifications (stopping eating before bed, elevated HOB). His abdominal symptoms are more bloating, not dysphagia, not really esophageal symptoms, but could perhaps be a downstream effect of esophageal dysmotility.   - food diary as above  - continue with PPI BID and lifestyle modifications    Issues Impacting Complexity of Management:  -None    Patient seen and discussed with Dr. Jesse. Follow up in 6 months.        Subjective   HPI:     Interval: presented to hospital in [place]  02/05/24, presented with chest pain radiating to arm. Had extensive workup without evidence of new cardiac event. Noticed some fever blisters.     Last seen by Dr. Filbert 05/11/23 and planned rifaximin  for post-prandial bloating but too expensive. Prescribed course of flagyl  in August 2025, which he took for 2 weeks without noticing significant improvement in bloating. Notices bloating even after drinking bottle of water. Feels bloating every day but some days worse than others. Notices soon after eating (within minutes). Worse with fried and spicy foods but occurs with most foods. Has cereal with milk, does eat a lot of beans/legumes. Notes significant flatulence. Usually has daily BM but occasional constipation and then will take Miralax . No melena or hematochezia. No nausea or vomiting. In the past has tried OTC gasx without improvement.     Taking pantoprazole  40mg  BID and noticing improvement in reflux. If eats late (after 6pm) will feel reflux symptoms, uses pillows to elevate head. No dysphagia or odynophagia. No recent weight loss. Taking semaglutide  3mg  but started within past 1-2 months. Had prior Nissen fundoplication and overall symptoms have been consistent since then but controlled with lifestyle modifications.     Wt Readings from Last 5 Encounters:   02/14/24 99.5 kg (219 lb 4.8 oz)   02/12/24 (!) 101.7 kg (224 lb 4.8 oz)   08/06/23 (!) 103.1 kg (227 lb 6.4 oz)   05/11/23 (!) 101 kg (222 lb 9.6 oz)   05/08/23 100.5 kg (221 lb 8 oz)     Social History: Denies alcohol, drug, and tobacco use. Prior smokeless tobacco.     Past Medical History: Past Medical History[1]    Surgical History: Past Surgical History[2]    Family History: No family history  of colon cancers or inflammatory bowel disease. Mother with laryngeal cancer, amyloidosis.     Pertinent Prior Data:    Labs:  CBC (9/9) WBC 5.48, Hgb 13.7 (15.4 day prior), PLT 190  CMP: Na 139, K 4, Cr 1.13, AST 28, ALT 17, ALP 42, Albumin 4, Tbili 0.7    Review of endoscopy found:  Upper endoscopy - 08/29/2019 - benign-appearing bleb in mid-esophagus, diverticulum in the mid esophagus. Duodenal ulcers up to 3 cm, notes negative testing for HP. No remaining visible Nissen.  Colonoscopy - 08/29/2019 - internal hemorrhoids  Upper endoscopy - 04/30/2009 - nl     Review of imaging found:  CT abdomen pelvis w wo contrast - 10/24/2022 - nl for GI organs  CT chest w contrast - 07/26/2022 - thoracostomy defect, small recurrent hiatus hernia    Current Medications: Medications Ordered Prior to Encounter[3]    Objective   Vital Signs:  Vitals:    02/14/24 1450   BP: 111/64   BP Site: L Arm   BP Position: Sitting   BP Cuff Size: Medium   Pulse: 80   Weight: 99.5 kg (219 lb 4.8 oz)   Height: 175.3 cm (5' 9)      Body mass index is 32.38 kg/m??.    Physical Exam:   Gen: Well appearing in NAD, answers questions appropriately. Healing/crusted perioral lesions  Eyes: Sclera anicteric  Lungs: Breathing comfortably on room air   Abdomen: Normoactive bowel sounds, soft, NTND, no rebound/guarding  Rectal: Deferred  Psych: Alert, oriented, normal mood and affect.          [1]   Past Medical History:  Diagnosis Date    Achilles tendinitis of left lower extremity 03/26/2023    Arthritis     Asthma (HHS-HCC)     Cancer    (CMS-HCC)     Diabetes mellitus    (CMS-HCC)     Hearing impairment     hearing aids    Hyperlipidemia     Kidney stone     Long term current use of systemic steroids 06/18/2018    Myocardial infarction    (CMS-HCC)     Peripheral neuropathy     Risk for falls 12/23/2021    Visual impairment     cataract sx 05/27/20   [2]   Past Surgical History:  Procedure Laterality Date    CATARACT EXTRACTION      CATARACT EXTRACTION, BILATERAL      CHG ULTRASONIC GUIDANCE, INTRAOPERATIVE Right 02/21/2021    Procedure: ULTRASONIC GUIDANCE, INTRAOPERATIVE;  Surgeon: Alm Orvin Sprinkle, MD;  [facility];  Service: Urology    esophaus rap surgery      PR CATH PLACE/CORON ANGIO, IMG SUPER/INTERP,W LEFT HEART VENTRICULOGRAPHY N/A 03/31/2015    Procedure: Left Heart Catheterization;  Surgeon: Donna JAYSON Sharps, MD;  Location: [facility] CATH;  Service: Cardiology PR COLONOSCOPY FLX DX W/COLLJ SPEC WHEN PFRMD N/A 08/29/2019    Procedure: COLONOSCOPY, FLEXIBLE, PROXIMAL TO SPLENIC FLEXURE; DIAGNOSTIC, W/WO COLLECTION SPECIMEN BY BRUSH OR WASH;  Surgeon: Lorrene Georgi Rase, MD;  Location: [facility] PROCED[facility];  Service: Gastroenterology    PR PERCUT DRAIN/INJECT RENAL CYST Right 02/21/2021    Procedure: ASPIRATION AND/OR INJECTION OF RENAL CYST OR PELVIS BY NEEDLE, PERCUTANEOUS;  Surgeon: Alm Orvin Sprinkle, MD;  Location: [facility] OR [facility];  Service: Urology    PR PERQ NL/PL LITHOTRP COMPLEX >2 CM MLT LOCATIONS Right 02/21/2021    Procedure: supine standard PCNL; need fortec;  Surgeon: Alm Orvin Sprinkle, MD;  Location: [facility] OR [facility];  Service: Urology    PR PVB THORACIC SINGLE INJECTION SITE W/IMG GID Right 02/21/2021    Procedure: PARAVERTEBRAL BLOCK (PVB) (PARASPINOUS BLOCK), THORACIC; SINGLE INJECTION SITE (INCLUDES IMAGING GUIDANCE, WHEN PERFORMED);  Surgeon: Alm Orvin Sprinkle, MD;  [facility];  Service: Urology    PR UPPER GI ENDOSCOPY,BIOPSY N/A 08/29/2019    Procedure: UGI ENDOSCOPY; WITH BIOPSY, SINGLE OR MULTIPLE;  Surgeon: Lorrene Georgi Rase, MD;  Location: [facility] PROC[facility];  Service: Gastroenterology    removal for fatty tumors      SINUS SURGERY      SINUS SURGERY      SKIN BIOPSY      urolift     [3]   Current Outpatient Medications on File Prior to Visit   Medication Sig Dispense Refill    aspirin (ECOTRIN) 81 MG tablet Take 1 tablet (81 mg total) by mouth daily.      clobetasoL  (TEMOVATE ) 0.05 % cream Apply topically Two (2) times a day. 30 g 0    evolocumab  (REPATHA  SURECLICK) 140 mg/mL PnIj Inject the contents of one pen (140 mg) under the skin every fourteen (14) days. 6 mL 3    ezetimibe  (ZETIA ) 10 mg tablet Take 1 tablet (10 mg total) by mouth daily.      fluocinolone acetonide oil 0.01 % Drop INSTILL 2 DROPS INTO AFFECTED EAR(S) TWICE DAILY AS NEEDED FOR ITCHING      fluocinonide  (LIDEX ) 0.05 % external solution Apply topically Two (2) times a day. 60 mL 1    fluocinonide  (LIDEX ) 0.05 % ointment Apply topically two (2) times a day as needed. 30 g 1    fluticasone  propionate (FLONASE ) 50 mcg/actuation nasal spray 2 sprays into each nostril daily. 16 g 11    fluticasone -umeclidin-vilanter (TRELEGY ELLIPTA ) 100-62.5-25 mcg inhaler Inhale 1 puff daily. 90 each 3    insulin  NPH-insulin  regular, 70/30, (HUMULIN /NOVOLIN) 100 unit/mL (70-30) injection Inject 0.22 mL (22 Units total) under the skin two (2) times a day. 40 mL 3    insulin  syr/ndl U100 half mark 0.5 mL 31 gauge x 15/64 (6 mm) Syrg Use to take insulin  as directed by Dr. Hosie 180 each 3    ketoconazole  (NIZORAL ) 2 % cream Apply 1 Application topically daily for 14 days. 15 g 1    ketoconazole  (NIZORAL ) 2 % shampoo WASH SCALP 3 TIMES A WEEK. LEAVE IT IN PLACE FOR 30 MINUTES BEFORE RINSING      lisinopril  (PRINIVIL ,ZESTRIL ) 20 MG tablet Take 1 tablet (20 mg total) by mouth daily. 30 tablet 6    metoPROLOL succinate (TOPROL-XL) 25 MG 24 hr tablet Take 15 mg by mouth daily.      metroNIDAZOLE  (FLAGYL ) 250 MG tablet Take 1 tablet (250 mg total) by mouth Three (3) times a day for 14 days. 42 tablet 0    nitroglycerin  (NITROSTAT ) 0.4 MG SL tablet Place 1 tablet (0.4 mg total) under the tongue every five (5) minutes as needed for chest pain. Maximum of 3 doses in 15 minutes.      pantoprazole  (PROTONIX ) 40 MG tablet Take 1 tablet (40 mg total) by mouth Two (2) times a day (30 minutes before a meal).      semaglutide  3 mg Tab Take 1 tablet (3 mg total) by mouth in the morning. 90 tablet 3    testosterone  1.62 % (20.25 mg/1.25 gram) GlPk Place 20.25 mg on the skin  in the morning. 90 packet 3     No current facility-administered medications on file prior to visit.

## 2024-02-14 DIAGNOSIS — R140 Abdominal distension (gaseous): Principal | ICD-10-CM

## 2024-02-14 DIAGNOSIS — K219 Gastro-esophageal reflux disease without esophagitis: Principal | ICD-10-CM

## 2024-02-14 NOTE — Unmapped (Signed)
 [facility]  Phone: [phone]         After Visit Instructions:  -- It was a pleasure to meet you in clinic. You can try the low-FODMAP diet to see if it helps with bloating. You can also try IBgard a peppermint-related medication to see if it helps with bloating although stop taking it if it worsens your reflux.     -- I will see you back in clinic in 6 months.    Thank you for seeing us  in clinic! If you do not have MyChart, please sign up at [email or web address] MyChart will allow you to view the notes including next steps and test results. If you have questions before our next visit, please send me an electronic message though MyChart or call in at the number below. Please note electronic messaging is only for non-urgent questions and response times can vary (3-5 business days).    Juliene Rogue, MD  [facility] Gastroenterology and Hepatology Fellow    Important phone numbers:  Nurse Contact: Grayce Balloon, MSN, RN  p: [phone] - f: [phone]    GI Clinic Appointments:  [phone] option 1 then option 1  Please call the GI Clinic appointment line if you need to schedule, reschedule or cancel an appointment in clinic. They can also answer any questions you may have about where your appointment is located and when you need to arrive.      GI Procedure Appointments:  [phone] option 1 then option 2  Please call the GI Procedures line if you need to schedule, reschedule or cancel any type of GI procedure (endoscopy, colonoscopy, motility testing, etc).  You can also call this number for prep instructions, etc.      Radiology: [phone]  If you are being scheduled for any type of radiology study, you will need to call to receive your appointment time.  Please call this number for information.       For emergencies after normal business hours or on weekends/holidays   Proceed to the nearest emergency room OR contact the [facility] Operator at [phone] who can page the Gastroenterology Fellow on call.

## 2024-02-14 NOTE — Unmapped (Signed)
 Patient is scheduled to be seen at [facility] today at 3 pm. There have been some cancellations and we called patient to ask if he might be able to come sooner. I left a message with clinic number and number to reschedule if he is not able to come.

## 2024-02-15 NOTE — Unmapped (Signed)
 [facility] Specialty Medication Onboarding    Specialty Medication: Repatha  Sureclick 140mg /mL pen injection  Prior Authorization: Approved   Financial Assistance: No - copay  <$25  Final Copay/Day Supply: $0 / 84 days    Insurance Restrictions: None     Notes to Pharmacist: N/A  Credit Card on File: no  Start Date on Rx:  N/A    The triage team has completed the benefits investigation and has determined that the patient is able to fill this medication at [facility]. Please contact the patient to complete the onboarding or follow up with the prescribing physician as needed.

## 2024-02-28 NOTE — Unmapped (Signed)
 Returneed call to patient who reports HA and aching here and there over the past couple of days.  Left jaw and half of bottom teeth sore.  Both arms sore, worse on right.  He would like to know if lisinopril  could cause. He has been on lisinopril  20mg  for a little over 2 weeks. I advised this sounds like a virus and he should reach out to his PCP. He stated understanding.    Home BP ranging 109/70s to 140/70s.      He reports not yet received repatha .  Repatha  approved and patient understands to call to schedule delivery.

## 2024-03-05 DIAGNOSIS — M5412 Radiculopathy, cervical region: Principal | ICD-10-CM

## 2024-03-07 DIAGNOSIS — M5416 Radiculopathy, lumbar region: Principal | ICD-10-CM

## 2024-03-24 ENCOUNTER — Inpatient Hospital Stay: Admit: 2024-03-24 | Discharge: 2024-03-25 | Payer: Medicare (Managed Care)

## 2024-03-31 ENCOUNTER — Encounter: Payer: Self-pay | Admitting: Radiology

## 2024-04-01 ENCOUNTER — Ambulatory Visit
Admit: 2024-04-01 | Discharge: 2024-04-02 | Payer: Medicare (Managed Care) | Attending: Internal Medicine | Primary: Internal Medicine

## 2024-04-01 DIAGNOSIS — Z8551 Personal history of malignant neoplasm of bladder: Principal | ICD-10-CM

## 2024-04-01 DIAGNOSIS — N500 Atrophy of testis: Principal | ICD-10-CM

## 2024-04-01 DIAGNOSIS — I2510 Atherosclerotic heart disease of native coronary artery without angina pectoris: Principal | ICD-10-CM

## 2024-04-01 DIAGNOSIS — K219 Gastro-esophageal reflux disease without esophagitis: Principal | ICD-10-CM

## 2024-04-01 DIAGNOSIS — J329 Chronic sinusitis, unspecified: Principal | ICD-10-CM

## 2024-04-01 DIAGNOSIS — E1142 Type 2 diabetes mellitus with diabetic polyneuropathy: Principal | ICD-10-CM

## 2024-04-01 DIAGNOSIS — Z794 Long term (current) use of insulin: Principal | ICD-10-CM

## 2024-04-01 DIAGNOSIS — M5030 Other cervical disc degeneration, unspecified cervical region: Principal | ICD-10-CM

## 2024-04-01 DIAGNOSIS — E669 Obesity, unspecified: Principal | ICD-10-CM

## 2024-04-01 DIAGNOSIS — J410 Simple chronic bronchitis: Principal | ICD-10-CM

## 2024-04-01 LAB — ALBUMIN / CREATININE URINE RATIO
ALBUMIN QUANT URINE: 3.4 mg/dL
ALBUMIN/CREATININE RATIO: 37.1 ug/mg — ABNORMAL HIGH (ref 0.0–30.0)
CREATININE, URINE: 91.6 mg/dL

## 2024-04-01 MED ORDER — LORATADINE 10 MG TABLET
ORAL_TABLET | Freq: Every day | ORAL | 3 refills | 90.00000 days | Status: CP | PRN
Start: 2024-04-01 — End: 2025-04-01

## 2024-04-01 MED ORDER — FLUTICASONE FUR. 100 MCG-UMECLID 62.5 MCG-VILANT 25 MCG INHALAT.POWDER
Freq: Every day | RESPIRATORY_TRACT | 3 refills | 90.00000 days | Status: CP
Start: 2024-04-01 — End: 2025-03-27

## 2024-04-01 NOTE — Progress Notes (Signed)
 [name] Return Visit    Assessment/Plan:     William Keller is a 83 y.o. male presenting to [facility] for follow up.    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 Cardiologist recommended metoprolol and increased lisinopril  for management. Metoprolol not linked to jaw pain- reinforced today but patient worried about causing low heart rates/fatigue- home HR 60s-70s. Lisinopril  beneficial for renal protection.  - Restart metoprolol at half a tablet daily if willing- but declined  - Home BP averaging now < 130/80  - Monitor blood pressure; if average exceeds 130/80, restart lisinopril  at 5 mg daily.  - Continue aspirin daily.    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 Neuropathy distally stable. Discussed potential Rybelsus  increase for insulin  reduction and weight loss.  - Continue current insulin  regimen.  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 MRI shows high-grade narrowing at C3-C4 and C6-C7. Scheduled for nerve block evaluation. Normal strength and reflexes on exam  - Proceed with nerve block evaluation with spine specialist- seeing specialist out of [place]  - Consider radiofrequency ablation if nerve block is effective.    Chronic low back pain  Pain localized to lower back muscles, no sciatica.  - Use ice and Voltaren  gel for muscle inflammation.  - Continue massage therapy.    Chronic obstructive pulmonary disease (COPD)  Managed with Trelegy Ellipta . Stable  - Refilled Trelegy Ellipta  prescription.    Primary hypogonadism  Testosterone  levels slightly low, no symptoms warranting dose increase.  - Continue current testosterone  gel dose.    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 IBS symptoms include bloating and constipation. Low FODMAP diet and Miralax  recommended.  - Continue pantoprazole  twice daily.  - Continue low FODMAP diet and Miralax  as needed.    History of malignant neoplasm of bladder  Follow-up with urology scheduled for March.  - Attend urology follow-up in March.    Chronic rhinitis  Symptoms suggestive of allergies. Loratadine  recommended.  - Prescribed loratadine  as needed for allergy symptoms.    General Health Maintenance  Discussed flu vaccination timing. Prefers to wait until later in the season.  - Consider flu vaccination in November or December.      Orders Placed This Encounter   Procedures    Albumin/creatinine urine ratio         RTC: Return in about 3 months (around 07/02/2024).    ------------------------------------------------------------------------------------------------------------  Subjective     HPI: William Keller is a 83 y.o. male who returns to [facility] for follow up.  Please also see HPI as per A&P above.    History of Present Illness  William Keller is an 84 year old male with coronary artery disease and hypertension who presents for a routine follow-up.    He was recently hospitalized from September 8th to September 9th for evaluation of bloating, atypical chest pain, and elevated blood pressure. During this hospitalization, his troponins were not elevated, and his EKG showed no ischemic changes. A stress test revealed no inducible ischemia, but a large defect was noted on the test. He was discharged on atorvastatin 40 mg daily, lisinopril  5 mg daily,  and metoprolol 12.5 mg daily. He has not been adherent to his medications, taking aspirin intermittently and discontinuing Repatha  due to cost. His cardiologist later recommended stopping atorvastatin due to intolerance and restarting Repatha , which is to be mailed through [facility]. His lisinopril  was increased to 20 mg daily for better blood pressure control.    He has a history of gastrointestinal issues, including bloating and irritable bowel syndrome (IBS), for which he follows a low FODMAP diet and uses IB Guard and PRN Miralax  for constipation. He is also on pantoprazole  twice daily for GERD, esophageal diverticulum, and a history of peptic ulcer disease. He recently completed an antibiotic course for a UTI and experienced constipation as a side effect. A previous antibiotic, rifaximin , was too expensive.    He experienced jaw discomfort and sensitivity in his teeth, which he initially attributed to metoprolol. After discontinuing metoprolol, the symptoms resolved. He was prescribed azithromycin by his primary care provider, suspecting a viral illness. He has since stopped metoprolol and lisinopril , taking lisinopril  only as needed when his blood pressure rises.    He manages diabetes with semaglutide  and insulin , currently taking 22 units twice a day. His A1c was noted to be less than 7% during his hospital stay. No episodes of hypoglycemia.    He experiences neck and back pain, with neck pain radiating down his arm. His back pain is exacerbated by physical activity but improves with rest. He continues to work on his property despite the pain.    He uses Trelegy Ellipta  daily for COPD and reports needing a refill. He can walk further without shortness of breath than he can with pain.      ROS: Pertinent positives and negatives are documented as per the HPI.    I have reviewed and (if needed) updated the patient's problem list, medications, allergies, past medical and surgical history, social and family history in the EMR.    Objective     Physical Exam   Vitals:   Vitals:    04/01/24 1224   BP: 131/75   Pulse: 70   Temp: 36.3 ??C (97.3 ??F)     Wt Readings from Last 3 Encounters:   04/01/24 (!) 101.9 kg (224 lb 9.6 oz)   02/14/24 99.5 kg (219 lb 4.8 oz)   02/12/24 (!) 101.7 kg (224 lb 4.8 oz)     Wt Readings from Last 20 Encounters:   04/01/24 (!) 101.9 kg (224 lb 9.6 oz)   02/14/24 99.5 kg (219 lb 4.8 oz)   02/12/24 (!) 101.7 kg (224 lb 4.8 oz)   08/06/23 (!) 103.1 kg (227 lb 6.4 oz)   05/11/23 (!) 101 kg (222 lb 9.6 oz)   05/08/23 100.5 kg (221 lb 8 oz)   05/07/23 100.2 kg (221 lb)   03/26/23 (!) 100.8 kg (222 lb 3.2 oz)   01/21/23 100.2 kg (221 lb)   12/25/22 100.4 kg (221 lb 6.4 oz)   11/08/22 99.3 kg (219 lb)   08/15/22 98.9 kg (218 lb)   07/26/22 97.9 kg (215 lb 12.8 oz)   04/11/22 98.3 kg (216 lb 12.8 oz)   03/02/22 98 kg (216 lb)   12/23/21 97.5 kg (215 lb)   11/16/21 98 kg (216 lb)   11/15/21 98.9 kg (218 lb)   11/15/21 99.2 kg (218 lb 9.6 oz)   10/27/21 (!) 101.1 kg (222 lb 12.8 oz)       Physical Exam  Well appearing  Alert and engaged and  very knowledgeable of medical history  NECK: Cervical spine with high-grade narrowing at C3-C4 and C6-C7. Negative Hoffman reflex. Biceps/brachioradialis 2+ symmetric BUE strength 5/5  CHEST: Lungs clear to auscultation.  RRR, no murmurs, no LE edema  Feet: Good circulation in feet, nails healthy.        Labs and Studies in EMR and Reviewed    Results  LABS  A1c: <7% (02/04/2024)  eGFR: 60-70    RADIOLOGY  MRI Cervical Spine: High-grade narrowing at C3-C4 and C6-C7    DIAGNOSTIC  EKG: No ischemic changes (02/04/2024)  Stress test: No inducible ischemia (02/04/2024)  Echocardiogram: Normal (02/04/2024)       I personally spent 38 minutes face-to-face and non-face-to-face in the care of this patient, which includes all pre, intra, and post visit time on the date of service.

## 2024-04-01 NOTE — Patient Instructions (Addendum)
 [facility] please call to have them send Repatha   [phone]     Keep monitoring home blood pressure at home and if average blood pressure increases to greater than 130/80 then re-start lisinopril      Get your influenza vaccine this fall!

## 2024-04-01 NOTE — Progress Notes (Signed)
 [name] Return Visit    Assessment/Plan:     Dustin Blackburn is a 83 y.o. male presenting to [facility] for follow up.    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 Cardiologist recommended metoprolol and increased lisinopril  for management. Metoprolol not linked to jaw pain- reinforced today but patient worried about causing low heart rates/fatigue- home HR 60s-70s. Lisinopril  beneficial for renal protection.  - Restart metoprolol at half a tablet daily if willing- but declined  - Home BP averaging now < 130/80  - Monitor blood pressure; if average exceeds 130/80, restart lisinopril  at 5 mg daily.  - Continue aspirin daily.    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 Neuropathy distally stable. Discussed potential Rybelsus  increase for insulin  reduction and weight loss.  - Continue current insulin  regimen.  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 MRI shows high-grade narrowing at C3-C4 and C6-C7. Scheduled for nerve block evaluation. Normal strength and reflexes on exam  - Proceed with nerve block evaluation with spine specialist- seeing specialist out of [place]  - Consider radiofrequency ablation if nerve block is effective.    Chronic low back pain  Pain localized to lower back muscles, no sciatica.  - Use ice and Voltaren  gel for muscle inflammation.  - Continue massage therapy.    Chronic obstructive pulmonary disease (COPD)  Managed with Trelegy Ellipta . Stable  - Refilled Trelegy Ellipta  prescription.    Primary hypogonadism  Testosterone  levels slightly low, no symptoms warranting dose increase.  - Continue current testosterone  gel dose.    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 IBS symptoms include bloating and constipation. Low FODMAP diet and Miralax  recommended.  - Continue pantoprazole  twice daily.  - Continue low FODMAP diet and Miralax  as needed.    History of malignant neoplasm of bladder  Follow-up with urology scheduled for March.  - Attend urology follow-up in March.    Chronic rhinitis  Symptoms suggestive of allergies. Loratadine  recommended.  - Prescribed loratadine  as needed for allergy symptoms.    General Health Maintenance  Discussed flu vaccination timing. Prefers to wait until later in the season.  - Consider flu vaccination in November or December.      Orders Placed This Encounter   Procedures    Albumin/creatinine urine ratio         RTC: Return in about 3 months (around 07/02/2024).    ------------------------------------------------------------------------------------------------------------  Subjective     HPI: Dustin Blackburn is a 83 y.o. male who returns to [facility] for follow up.  Please also see HPI as per A&P above.    History of Present Illness  Dustin Blackburn is an 84 year old male with coronary artery disease and hypertension who presents for a routine follow-up.    He was recently hospitalized from September 8th to September 9th for evaluation of bloating, atypical chest pain, and elevated blood pressure. During this hospitalization, his troponins were not elevated, and his EKG showed no ischemic changes. A stress test revealed no inducible ischemia, but a large defect was noted on the test. He was discharged on atorvastatin 40 mg daily, lisinopril  5 mg daily,  and metoprolol 12.5 mg daily. He has not been adherent to his medications, taking aspirin intermittently and discontinuing Repatha  due to cost. His cardiologist later recommended stopping atorvastatin due to intolerance and restarting Repatha , which is to be mailed through [facility]. His lisinopril  was increased to 20 mg daily for better blood pressure control.    He has a history of gastrointestinal issues, including bloating and irritable bowel syndrome (IBS), for which he follows a low FODMAP diet and uses IB Guard and PRN Miralax  for constipation. He is also on pantoprazole  twice daily for GERD, esophageal diverticulum, and a history of peptic ulcer disease. He recently completed an antibiotic course for a UTI and experienced constipation as a side effect. A previous antibiotic, rifaximin , was too expensive.    He experienced jaw discomfort and sensitivity in his teeth, which he initially attributed to metoprolol. After discontinuing metoprolol, the symptoms resolved. He was prescribed azithromycin by his primary care provider, suspecting a viral illness. He has since stopped metoprolol and lisinopril , taking lisinopril  only as needed when his blood pressure rises.    He manages diabetes with semaglutide  and insulin , currently taking 22 units twice a day. His A1c was noted to be less than 7% during his hospital stay. No episodes of hypoglycemia.    He experiences neck and back pain, with neck pain radiating down his arm. His back pain is exacerbated by physical activity but improves with rest. He continues to work on his property despite the pain.    He uses Trelegy Ellipta  daily for COPD and reports needing a refill. He can walk further without shortness of breath than he can with pain.      ROS: Pertinent positives and negatives are documented as per the HPI.    I have reviewed and (if needed) updated the patient's problem list, medications, allergies, past medical and surgical history, social and family history in the EMR.    Objective     Physical Exam   Vitals:   Vitals:    04/01/24 1224   BP: 131/75   Pulse: 70   Temp: 36.3 ??C (97.3 ??F)     Wt Readings from Last 3 Encounters:   04/01/24 (!) 101.9 kg (224 lb 9.6 oz)   02/14/24 99.5 kg (219 lb 4.8 oz)   02/12/24 (!) 101.7 kg (224 lb 4.8 oz)     Wt Readings from Last 20 Encounters:   04/01/24 (!) 101.9 kg (224 lb 9.6 oz)   02/14/24 99.5 kg (219 lb 4.8 oz)   02/12/24 (!) 101.7 kg (224 lb 4.8 oz)   08/06/23 (!) 103.1 kg (227 lb 6.4 oz)   05/11/23 (!) 101 kg (222 lb 9.6 oz)   05/08/23 100.5 kg (221 lb 8 oz)   05/07/23 100.2 kg (221 lb)   03/26/23 (!) 100.8 kg (222 lb 3.2 oz)   01/21/23 100.2 kg (221 lb)   12/25/22 100.4 kg (221 lb 6.4 oz)   11/08/22 99.3 kg (219 lb)   08/15/22 98.9 kg (218 lb)   07/26/22 97.9 kg (215 lb 12.8 oz)   04/11/22 98.3 kg (216 lb 12.8 oz)   03/02/22 98 kg (216 lb)   12/23/21 97.5 kg (215 lb)   11/16/21 98 kg (216 lb)   11/15/21 98.9 kg (218 lb)   11/15/21 99.2 kg (218 lb 9.6 oz)   10/27/21 (!) 101.1 kg (222 lb 12.8 oz)       Physical Exam  Well appearing  Alert and engaged and  very knowledgeable of medical history  NECK: Cervical spine with high-grade narrowing at C3-C4 and C6-C7. Negative Hoffman reflex. Biceps/brachioradialis 2+ symmetric BUE strength 5/5  CHEST: Lungs clear to auscultation.  RRR, no murmurs, no LE edema  Feet: Good circulation in feet, nails healthy.        Labs and Studies in EMR and Reviewed    Results  LABS  A1c: <7% (02/04/2024)  eGFR: 60-70    RADIOLOGY  MRI Cervical Spine: High-grade narrowing at C3-C4 and C6-C7    DIAGNOSTIC  EKG: No ischemic changes (02/04/2024)  Stress test: No inducible ischemia (02/04/2024)  Echocardiogram: Normal (02/04/2024)       I personally spent 38 minutes face-to-face and non-face-to-face in the care of this patient, which includes all pre, intra, and post visit time on the date of service.

## 2024-04-27 DIAGNOSIS — E66811 Class 1 obesity with alveolar hypoventilation and body mass index (BMI) of 32.0 to 32.9 in adult, unspecified whether serious comorbidity present (CMS-HCC): Principal | ICD-10-CM

## 2024-04-27 DIAGNOSIS — E1142 Type 2 diabetes mellitus with diabetic polyneuropathy: Principal | ICD-10-CM

## 2024-04-27 DIAGNOSIS — E662 Morbid (severe) obesity with alveolar hypoventilation: Principal | ICD-10-CM

## 2024-04-27 DIAGNOSIS — Z6832 Body mass index (BMI) 32.0-32.9, adult: Principal | ICD-10-CM

## 2024-04-27 DIAGNOSIS — Z794 Long term (current) use of insulin: Principal | ICD-10-CM

## 2024-04-27 MED ORDER — INSULIN HUMAN U-100 NPH-REGULR 70-30 MIX 100 UNIT/ML SUBCUTANEOUS SUSP
Freq: Two times a day (BID) | SUBCUTANEOUS | 3 refills | 111.00000 days
Start: 2024-04-27 — End: 2024-07-26

## 2024-04-27 MED ORDER — SEMAGLUTIDE 7 MG TABLET
ORAL_TABLET | Freq: Every day | ORAL | 3 refills | 90.00000 days | Status: CP
Start: 2024-04-27 — End: ?

## 2024-05-06 DIAGNOSIS — C679 Malignant neoplasm of bladder, unspecified: Secondary | ICD-10-CM | POA: Insufficient documentation

## 2024-05-06 NOTE — Assessment & Plan Note (Signed)
 TURBT + mitomycin  (dx 2019)  - ~1cm LGTa at dome   - s/p two subsequent bladder biopsies - both NED  Last cysto (Mar 2025) - NED  Reviewed clinical history and prior cystoscopy tolerated this year.  Agree with annual surveillance regimen, due for office cystoscopy in March 2026.

## 2024-05-06 NOTE — Progress Notes (Unsigned)
   05/07/2024 11:04 AM   Dustin Blackburn [birth year] [id number]  Reason for visit: Follow up bladder Ca   HPI: 83 y.o. male, initial follow up with me today, previously seen by Dr. Penne in Mar 2025  Prior HPI: Hx of bladder Ca  - TURBT + mitomycin  (2019)  - ~1cm LGTa at dome   - s/p two subsequent bladder biopsies - both NED  - Last cysto (Mar 2025) - NED  Hx of nephrolithiasis  Hx of BPH  - s/p prior Urolift  Hx of GH  - CTU Jan 2024) - 11mm left interpolar renal stone    Physical Exam: There were no vitals taken for this visit.   Constitutional:  Alert and oriented, No acute distress. GU: ***  Laboratory Data: ***  Pertinent Imaging: I have personally viewed and interpreted the ***.    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  Agree with annual surveillance regimen, due for office cystoscopy in March 2026.        Penne JONELLE Skye, MD  [facility] [address] [place], [place] [address] [phone]

## 2024-05-07 ENCOUNTER — Ambulatory Visit: Admitting: Urology

## 2024-05-07 VITALS — BP 142/83 | HR 78 | Ht 69.0 in | Wt 218.0 lb

## 2024-05-07 DIAGNOSIS — E1142 Type 2 diabetes mellitus with diabetic polyneuropathy: Principal | ICD-10-CM

## 2024-05-07 DIAGNOSIS — E662 Morbid (severe) obesity with alveolar hypoventilation: Principal | ICD-10-CM

## 2024-05-07 DIAGNOSIS — Z6832 Body mass index (BMI) 32.0-32.9, adult: Principal | ICD-10-CM

## 2024-05-07 DIAGNOSIS — Z794 Long term (current) use of insulin: Principal | ICD-10-CM

## 2024-05-07 DIAGNOSIS — E66811 Class 1 obesity with alveolar hypoventilation and body mass index (BMI) of 32.0 to 32.9 in adult, unspecified whether serious comorbidity present (CMS-HCC): Principal | ICD-10-CM

## 2024-05-07 DIAGNOSIS — C671 Malignant neoplasm of dome of bladder: Secondary | ICD-10-CM

## 2024-05-07 LAB — URINALYSIS, COMPLETE
Bilirubin, UA: NEGATIVE
Glucose, UA: NEGATIVE
Ketones, UA: NEGATIVE
Leukocytes,UA: NEGATIVE
Nitrite, UA: NEGATIVE
Protein,UA: NEGATIVE
Specific Gravity, UA: <1.005 — ABNORMAL LOW (ref 1.005–1.030)
Urobilinogen, Ur: 0.2 mg/dL (ref 0.2–1.0)
pH, UA: 6 (ref 5.0–7.5)

## 2024-05-07 LAB — MICROSCOPIC EXAMINATION

## 2024-05-07 MED ORDER — SEMAGLUTIDE 7 MG TABLET
ORAL_TABLET | Freq: Every day | ORAL | 0 refills | 30.00000 days | Status: CP
Start: 2024-05-07 — End: ?

## 2024-05-07 NOTE — Telephone Encounter (Signed)
 Copied from CRM #[id number]. Topic: Access To Clinicians - Medication Refill  >> May 07, 2024  2:46 PM Randall BROCKS wrote:  Seen by Dr.Tiller from [place].    [employer] representative is requesting the following:     Medication Name(s), Dose/Strengths, and Instructions: rybelsus  3mg      Patient states he went over it Dr.Tiller during his visit.Not sure about dosage    Quantity for 1 month supply    Pharmacy name and address:   [id number] - [place], [place] - [address]   Phone: [phone]  Fax: [phone]    Coverage: yes, coverage is accurate on file.    Medication request turnaround time: 72 business hours. (Caller Notified)          Does the caller want to be contacted regarding this request? No    Jcano  05/07/24

## 2024-06-10 DIAGNOSIS — E662 Morbid (severe) obesity with alveolar hypoventilation: Principal | ICD-10-CM

## 2024-06-10 DIAGNOSIS — E66811 Class 1 obesity with alveolar hypoventilation and body mass index (BMI) of 32.0 to 32.9 in adult, unspecified whether serious comorbidity present (CMS-HCC): Principal | ICD-10-CM

## 2024-06-10 DIAGNOSIS — Z6832 Body mass index (BMI) 32.0-32.9, adult: Principal | ICD-10-CM

## 2024-06-10 DIAGNOSIS — E1142 Type 2 diabetes mellitus with diabetic polyneuropathy: Principal | ICD-10-CM

## 2024-06-10 DIAGNOSIS — Z794 Long term (current) use of insulin: Principal | ICD-10-CM

## 2024-06-10 MED ORDER — SEMAGLUTIDE 7 MG TABLET
ORAL_TABLET | Freq: Every day | ORAL | 0 refills | 30.00000 days
Start: 2024-06-10 — End: ?

## 2024-06-12 MED ORDER — SEMAGLUTIDE 7 MG TABLET
ORAL_TABLET | Freq: Every day | ORAL | 3 refills | 90.00000 days | Status: CP
Start: 2024-06-12 — End: ?

## 2024-07-03 ENCOUNTER — Ambulatory Visit
Admit: 2024-07-03 | Discharge: 2024-07-04 | Payer: Medicare (Managed Care) | Attending: Internal Medicine | Primary: Internal Medicine

## 2024-07-03 DIAGNOSIS — E782 Mixed hyperlipidemia: Secondary | ICD-10-CM

## 2024-07-03 DIAGNOSIS — E669 Obesity, unspecified: Secondary | ICD-10-CM

## 2024-07-03 DIAGNOSIS — Z9181 History of falling: Principal | ICD-10-CM

## 2024-07-03 DIAGNOSIS — I2510 Atherosclerotic heart disease of native coronary artery without angina pectoris: Secondary | ICD-10-CM

## 2024-07-03 DIAGNOSIS — J410 Simple chronic bronchitis: Secondary | ICD-10-CM

## 2024-07-03 DIAGNOSIS — Z794 Long term (current) use of insulin: Secondary | ICD-10-CM

## 2024-07-03 DIAGNOSIS — N182 Chronic kidney disease, stage 2 (mild): Secondary | ICD-10-CM

## 2024-07-03 DIAGNOSIS — Z8551 Personal history of malignant neoplasm of bladder: Secondary | ICD-10-CM

## 2024-07-03 DIAGNOSIS — N500 Atrophy of testis: Secondary | ICD-10-CM

## 2024-07-03 DIAGNOSIS — M5136 Degeneration of intervertebral disc of lumbar region with discogenic back pain: Secondary | ICD-10-CM

## 2024-07-03 DIAGNOSIS — M5030 Other cervical disc degeneration, unspecified cervical region: Secondary | ICD-10-CM

## 2024-07-03 DIAGNOSIS — E1142 Type 2 diabetes mellitus with diabetic polyneuropathy: Secondary | ICD-10-CM

## 2024-07-03 LAB — ALBUMIN / CREATININE URINE RATIO
ALBUMIN QUANT URINE: 2.1 mg/dL
ALBUMIN/CREATININE RATIO: 48.5 ug/mg — ABNORMAL HIGH (ref 0.0–30.0)
CREATININE, URINE: 43.3 mg/dL

## 2024-07-03 LAB — BASIC METABOLIC PANEL
ANION GAP: 11 mmol/L (ref 5–14)
BLOOD UREA NITROGEN: 17 mg/dL (ref 9–23)
BUN / CREAT RATIO: 14
CALCIUM: 9.5 mg/dL (ref 8.7–10.4)
CHLORIDE: 102 mmol/L (ref 98–107)
CO2: 26.6 mmol/L (ref 20.0–31.0)
CREATININE: 1.23 mg/dL — ABNORMAL HIGH (ref 0.73–1.18)
EGFR CKD-EPI (2021) MALE: 59 mL/min/{1.73_m2} — ABNORMAL LOW (ref >=60)
GLUCOSE RANDOM: 122 mg/dL (ref 70–179)
POTASSIUM: 4.3 mmol/L (ref 3.4–4.8)
SODIUM: 140 mmol/L (ref 135–145)

## 2024-07-03 LAB — HEMOGLOBIN A1C
ESTIMATED AVERAGE GLUCOSE: 180 mg/dL
HEMOGLOBIN A1C: 7.9 % — ABNORMAL HIGH (ref 4.8–5.6)

## 2024-07-03 MED ORDER — LISINOPRIL 5 MG TABLET
ORAL_TABLET | Freq: Every day | ORAL | 3 refills | 90.00000 days | Status: CP
Start: 2024-07-03 — End: 2025-07-03

## 2024-07-03 MED ORDER — MOUNJARO 2.5 MG/0.5 ML SUBCUTANEOUS PEN INJECTOR
SUBCUTANEOUS | 1 refills | 28.00000 days | Status: CP
Start: 2024-07-03 — End: ?

## 2024-07-03 NOTE — Progress Notes (Signed)
 [facility] Return Visit    Assessment/Plan:     William Keller is a 84 y.o. male presenting [facility] for follow up.    Assessment & Plan  Type 2 diabetes mellitus with diabetic polyneuropathy  Type 2 diabetes with A1c of 6.9%, indicating good control at last visit however A1c higher today 7.9% and ideally given his underlying heart disease and neuropathy I would like control but I do not want hypoglycemia given his age and I would target an A1c less than 7.5% without hypoglycemia. Mounjaro  considered for cost-effectiveness and potential weight loss benefits.  -Oral semaglutide /Rybelsus  is too expensive  - Start Mounjaro  2.5 mg weekly.  - Reduced NPH insulin  to 16 units twice a day once he starts the injection  - Ordered A1c test today.    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 Mild proteinuria likely due to diabetes. Lisinopril  preferred for kidney protection.  He tells me that recent blood work done by PCP locally shows that he has progressed to his stage III and they recommended finish finerenone however he is currently not even on an ACE inhibitor  - Restarted lisinopril  5 mg daily.  - Ordered kidney function test and urine test today.    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 Lisinopril  beneficial for heart and kidney protection. Emphasized maintaining blood pressure below 130/80 mmHg.  He reports the higher dose of 20 mg daily that was recommended by cardiology made him feel dizzy and cause low blood pressures  - Plan to restart lower dose lisinopril  5 mg daily.  - Monitor blood pressure at home.  - Continues ASA 81 mg daily  - He stopped the beta-blocker and reports no underlying chest pain and of note he is very wary of medications    Mixed hyperlipidemia  LDL cholesterol at 150 mg/dL. Statins not tolerated and Zetia  not tolerated per his report. Importance of cholesterol management discussed.  - Discuss cholesterol management with cardiology at upcoming appointment-strongly recommended restarting the Repatha  injection    Obesity  Recent weight gain since discontinuation of Rybelsus . Mounjaro  may aid in weight loss and improve health.  - Started Mounjaro  2.5 mg weekly.    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 Scheduled for radiofrequency ablation for cervical pain.  - Proceed with scheduled radiofrequency ablation for cervical pain.    Chronic obstructive pulmonary disease (COPD)  COPD managed with daily inhaler use.  He can walk 4 miles but he can walk about 1/4 mile before needing to stop for his breathing.  Follows with pulmonology.  - Continue daily inhaler use.    Primary hypogonadism, testosterone  level this year was slightly low but no significant symptoms and no significant worsening of fatigue so we decided not to increase the dose  Managed with testosterone  gel.  - Continue current testosterone  gel regimen.    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    HCM:   Declined vaccines today. Colonoscopy normal 2021. Hearing testing completed and has hearing aids.   ACP: FULL code, but does not want prolonged life support.  Surrogate decision maker is his wife William Keller    Orders Placed This Encounter   Procedures    Hemoglobin A1c    Basic  Metabolic Panel    Albumin/creatinine urine ratio         RTC: Return in about 4 weeks (around 07/31/2024).  I would like to monitor blood pressure, tolerance of Mounjaro  and consider if time permits cognitive testing and follow-up    I will see him in 1 month and then he wants to see me every quarter every 3 months after that    ------------------------------------------------------------------------------------------------------------  Subjective     HPI: William Keller is a 84 y.o. male who [facility].  Please also see HPI as per A&P above.    Established with new urologist at [facility]  Will have cystoscopy 2026    Now on Rybelus 7 mg daily for DM2    History of Present Illness  William Keller is an 84 year old male with type 2 diabetes and chronic kidney disease who presents with concerns about medication costs and management of his diabetes and kidney disease. He is accompanied by his daughter, William Keller.    He is experiencing challenges with the cost of his diabetes medication, Rybelsus , which is over a thousand dollars for a 90-day supply. He has been successful in managing his blood sugar with Rybelsus , which allowed for a reduction in his insulin  dosage. His last A1c was 6.9% after hospitalization, but he reports a recent increase to 8.3% after discontinuing Rybelsus . He is currently taking 18 units of NPH insulin  twice a day.    He has a history of heart disease and has experienced weight gain since stopping Rybelsus . He has previously tried Ozempic , a GLP-1 agonist injection, but experienced nausea. He is hesitant about medications due to side effects.  He does agree to trying a new medication though as GLP-1 agonist has been helpful.    He has stage 2-3 chronic kidney disease. His last known eGFR was in the 60s, but recent information suggests it may have dropped. He has a mild amount of protein in his urine. He was previously on lisinopril , which he stopped due to low blood pressure. His blood pressure at home is around 134/79 mmHg.  He reports not taking any NSAIDs    He has a history of high cholesterol and was previously on Repatha , which he stopped due to mail order issues. His LDL cholesterol was 135 mg/dL in September. He has a stent in his heart. He does not tolerate statins well due to indigestion.    No chest pain and he is taking baby aspirin daily. He has COPD and uses an inhaler daily. He experiences shortness of breath and fatigue after walking a quarter mile but he can walk for miles and is very active on the family property.[place]     He is not interested in receiving flu, COVID, or RSV vaccines at this time.      ROS: Pertinent positives and negatives are documented as per the HPI.    I have reviewed and (if needed) updated the patient's problem list, medications, allergies, past medical and surgical history, social and family history in the EMR.    Objective     Physical Exam   Vitals:   Vitals:    07/03/24 0940   BP: 148/81   Pulse: 78   Temp: 36.5 ??C (97.7 ??F)     Wt Readings from Last 3 Encounters:   07/03/24 (!) 101.1 kg (222 lb 12.8 oz)   04/01/24 (!) 101.9 kg (224 lb 9.6 oz)   02/14/24 99.5 kg (219 lb 4.8 oz)  Physical Exam  Well-appearing  He has moderate recollection of medications and dosages recommended by other specialists and has tried so many different medications and so many different dosages that it is hard for him to keep up with that  CHEST: Lungs clear to auscultation.  CARDIOVASCULAR: Normal sinus rhythm, normal rate. No carotid bruits.  Central adiposity, rectus diathesis soft and nontender  No lower extremity edema  DP pulses 2+        Labs and Studies in EMR and Reviewed    Results  Labs  HbA1c (01/2024): 6.9%  eGFR (01/2024): In the sixties  Urine protein (01/2024): Mild proteinuria  LDL cholesterol (01/2024): 135       I personally spent 42 minutes face-to-face and non-face-to-face in the care of this patient, which includes all pre, intra, and post visit time on the date of service.  I spoke to clinical pharmacist today about what GLP-1 agonists or similar medication would be covered by his insurance and less expensive than the oral semaglutide 

## 2024-07-03 NOTE — Patient Instructions (Addendum)
 Re-start lisinopril  5 mg daily    If you develop dizziness and blood pressure systolic 100s or less okay to reduce to 2.5 mg daily    Do not need to start Kerendia right now    Start Mounjaro  2.5 mg weekly    When you start Mounjaro , reduce NPH insulin  to 16 units twice a day    Blood and urine tests today    Follow up in 1 month

## 2024-08-04 ENCOUNTER — Other Ambulatory Visit: Admitting: Urology

## 2024-08-18 ENCOUNTER — Other Ambulatory Visit: Admitting: Urology

## 2024-08-19 ENCOUNTER — Other Ambulatory Visit: Admitting: Urology
